# Patient Record
Sex: Female | Born: 1935 | Race: White | Hispanic: No | Smoking: Never smoker
Health system: Southern US, Community
[De-identification: ages and names within clinical notes are randomized; demographics above are authoritative.]

## PROBLEM LIST (undated history)

## (undated) DIAGNOSIS — N813 Complete uterovaginal prolapse: Secondary | ICD-10-CM

## (undated) DIAGNOSIS — I5032 Chronic diastolic (congestive) heart failure: Secondary | ICD-10-CM

## (undated) DIAGNOSIS — K5090 Crohn's disease, unspecified, without complications: Secondary | ICD-10-CM

## (undated) DIAGNOSIS — K649 Unspecified hemorrhoids: Secondary | ICD-10-CM

## (undated) DIAGNOSIS — N390 Urinary tract infection, site not specified: Principal | ICD-10-CM

## (undated) DIAGNOSIS — I10 Essential (primary) hypertension: Secondary | ICD-10-CM

## (undated) DIAGNOSIS — I5031 Acute diastolic (congestive) heart failure: Secondary | ICD-10-CM

## (undated) DIAGNOSIS — K5790 Diverticulosis of intestine, part unspecified, without perforation or abscess without bleeding: Secondary | ICD-10-CM

## (undated) DIAGNOSIS — N8110 Cystocele, unspecified: Secondary | ICD-10-CM

## (undated) DIAGNOSIS — N1330 Unspecified hydronephrosis: Secondary | ICD-10-CM

## (undated) DIAGNOSIS — G894 Chronic pain syndrome: Secondary | ICD-10-CM

## (undated) DIAGNOSIS — W19XXXA Unspecified fall, initial encounter: Secondary | ICD-10-CM

## (undated) DIAGNOSIS — IMO0002 Reserved for concepts with insufficient information to code with codable children: Secondary | ICD-10-CM

## (undated) DIAGNOSIS — K5792 Diverticulitis of intestine, part unspecified, without perforation or abscess without bleeding: Secondary | ICD-10-CM

## (undated) DIAGNOSIS — S32599A Other specified fracture of unspecified pubis, initial encounter for closed fracture: Secondary | ICD-10-CM

## (undated) DIAGNOSIS — B9620 Unspecified Escherichia coli [E. coli] as the cause of diseases classified elsewhere: Secondary | ICD-10-CM

## (undated) DIAGNOSIS — Z8639 Personal history of other endocrine, nutritional and metabolic disease: Secondary | ICD-10-CM

## (undated) HISTORY — DX: Personal history of other endocrine, nutritional and metabolic disease: Z86.39

## (undated) HISTORY — DX: Complete uterovaginal prolapse: N81.3

## (undated) HISTORY — DX: Chronic pain syndrome: G89.4

## (undated) HISTORY — DX: Cystocele, unspecified: N81.10

## (undated) HISTORY — PX: HIP FRACTURE SURGERY: SHX118

## (undated) HISTORY — DX: Chronic diastolic (congestive) heart failure: I50.32

## (undated) HISTORY — DX: Other specified fracture of unspecified pubis, initial encounter for closed fracture: S32.599A

## (undated) HISTORY — DX: Acute diastolic (congestive) heart failure: I50.31

## (undated) HISTORY — DX: Unspecified fall, initial encounter: W19.XXXA

## (undated) HISTORY — DX: Unspecified Escherichia coli (E. coli) as the cause of diseases classified elsewhere: B96.20

## (undated) HISTORY — DX: Unspecified hemorrhoids: K64.9

## (undated) HISTORY — DX: Essential (primary) hypertension: I10

## (undated) HISTORY — DX: Diverticulitis of intestine, part unspecified, without perforation or abscess without bleeding: K57.92

## (undated) HISTORY — DX: Unspecified hydronephrosis: N13.30

## (undated) HISTORY — PX: TONSILLECTOMY: SUR1361

## (undated) HISTORY — PX: RHINOPLASTY: SUR1284

## (undated) HISTORY — DX: Diverticulosis of intestine, part unspecified, without perforation or abscess without bleeding: K57.90

## (undated) HISTORY — DX: Reserved for concepts with insufficient information to code with codable children: IMO0002

## (undated) HISTORY — DX: Urinary tract infection, site not specified: N39.0

## (undated) HISTORY — DX: Crohn's disease, unspecified, without complications: K50.90

---

## 2004-06-20 ENCOUNTER — Inpatient Hospital Stay (HOSPITAL_COMMUNITY): Admission: EM | Admit: 2004-06-20 | Discharge: 2004-06-25 | Payer: Self-pay | Admitting: Emergency Medicine

## 2008-06-10 DIAGNOSIS — K5090 Crohn's disease, unspecified, without complications: Secondary | ICD-10-CM

## 2008-06-10 HISTORY — DX: Crohn's disease, unspecified, without complications: K50.90

## 2008-12-20 ENCOUNTER — Ambulatory Visit: Payer: Self-pay | Admitting: Internal Medicine

## 2008-12-20 ENCOUNTER — Inpatient Hospital Stay (HOSPITAL_COMMUNITY): Admission: EM | Admit: 2008-12-20 | Discharge: 2008-12-26 | Payer: Self-pay | Admitting: Emergency Medicine

## 2008-12-20 ENCOUNTER — Encounter (INDEPENDENT_AMBULATORY_CARE_PROVIDER_SITE_OTHER): Payer: Self-pay | Admitting: *Deleted

## 2008-12-21 ENCOUNTER — Encounter: Payer: Self-pay | Admitting: Internal Medicine

## 2008-12-23 ENCOUNTER — Encounter: Payer: Self-pay | Admitting: Internal Medicine

## 2008-12-23 HISTORY — PX: COLONOSCOPY: SHX5424

## 2009-01-04 ENCOUNTER — Ambulatory Visit: Payer: Self-pay | Admitting: Internal Medicine

## 2009-01-04 DIAGNOSIS — N8110 Cystocele, unspecified: Secondary | ICD-10-CM | POA: Insufficient documentation

## 2009-01-04 DIAGNOSIS — K5000 Crohn's disease of small intestine without complications: Secondary | ICD-10-CM | POA: Insufficient documentation

## 2009-01-05 ENCOUNTER — Encounter: Payer: Self-pay | Admitting: Internal Medicine

## 2009-01-05 LAB — CONVERTED CEMR LAB: CRP, High Sensitivity: 2.2

## 2009-01-06 ENCOUNTER — Encounter: Payer: Self-pay | Admitting: Internal Medicine

## 2009-01-06 DIAGNOSIS — E559 Vitamin D deficiency, unspecified: Secondary | ICD-10-CM | POA: Insufficient documentation

## 2009-02-08 ENCOUNTER — Ambulatory Visit: Payer: Self-pay | Admitting: Internal Medicine

## 2009-02-17 ENCOUNTER — Telehealth: Payer: Self-pay | Admitting: Internal Medicine

## 2009-02-22 ENCOUNTER — Ambulatory Visit: Payer: Self-pay | Admitting: Internal Medicine

## 2009-02-22 LAB — CONVERTED CEMR LAB
AST: 14 units/L (ref 0–37)
Basophils Absolute: 0.1 10*3/uL (ref 0.0–0.1)
Bilirubin, Direct: 0.1 mg/dL (ref 0.0–0.3)
Eosinophils Absolute: 0 10*3/uL (ref 0.0–0.7)
Eosinophils Relative: 0.2 % (ref 0.0–5.0)
HCT: 42.8 % (ref 36.0–46.0)
Hemoglobin: 14.8 g/dL (ref 12.0–15.0)
Lymphocytes Relative: 7.3 % — ABNORMAL LOW (ref 12.0–46.0)
MCV: 88.6 fL (ref 78.0–100.0)
Monocytes Absolute: 0.2 10*3/uL (ref 0.1–1.0)
Neutro Abs: 7.5 10*3/uL (ref 1.4–7.7)
Platelets: 323 10*3/uL (ref 150.0–400.0)
RBC: 4.83 M/uL (ref 3.87–5.11)
Total Protein: 7 g/dL (ref 6.0–8.3)
WBC: 8.4 10*3/uL (ref 4.5–10.5)

## 2009-03-03 ENCOUNTER — Telehealth: Payer: Self-pay | Admitting: Internal Medicine

## 2009-03-13 ENCOUNTER — Encounter: Payer: Self-pay | Admitting: Internal Medicine

## 2009-03-13 ENCOUNTER — Emergency Department (HOSPITAL_COMMUNITY): Admission: EM | Admit: 2009-03-13 | Discharge: 2009-03-13 | Payer: Self-pay | Admitting: Emergency Medicine

## 2009-03-18 ENCOUNTER — Ambulatory Visit: Payer: Self-pay | Admitting: Cardiology

## 2009-03-18 ENCOUNTER — Inpatient Hospital Stay (HOSPITAL_COMMUNITY): Admission: EM | Admit: 2009-03-18 | Discharge: 2009-03-24 | Payer: Self-pay | Admitting: Emergency Medicine

## 2009-03-20 ENCOUNTER — Ambulatory Visit: Payer: Self-pay | Admitting: Internal Medicine

## 2009-03-20 ENCOUNTER — Ambulatory Visit: Payer: Self-pay | Admitting: Gastroenterology

## 2009-03-21 ENCOUNTER — Encounter: Payer: Self-pay | Admitting: Internal Medicine

## 2009-03-21 ENCOUNTER — Encounter (INDEPENDENT_AMBULATORY_CARE_PROVIDER_SITE_OTHER): Payer: Self-pay | Admitting: *Deleted

## 2009-03-22 ENCOUNTER — Encounter (INDEPENDENT_AMBULATORY_CARE_PROVIDER_SITE_OTHER): Payer: Self-pay | Admitting: Internal Medicine

## 2009-03-22 ENCOUNTER — Ambulatory Visit: Payer: Self-pay | Admitting: Physical Medicine & Rehabilitation

## 2009-04-20 LAB — CONVERTED CEMR LAB

## 2009-05-16 ENCOUNTER — Telehealth: Payer: Self-pay | Admitting: Internal Medicine

## 2009-07-26 ENCOUNTER — Telehealth: Payer: Self-pay | Admitting: Internal Medicine

## 2009-08-03 ENCOUNTER — Ambulatory Visit: Payer: Self-pay | Admitting: Internal Medicine

## 2009-08-29 ENCOUNTER — Telehealth: Payer: Self-pay | Admitting: Internal Medicine

## 2009-08-30 ENCOUNTER — Ambulatory Visit: Payer: Self-pay | Admitting: Internal Medicine

## 2009-08-31 LAB — CONVERTED CEMR LAB
Alkaline Phosphatase: 84 units/L (ref 39–117)
BUN: 13 mg/dL (ref 6–23)
Creatinine, Ser: 0.9 mg/dL (ref 0.4–1.2)
Glucose, Bld: 149 mg/dL — ABNORMAL HIGH (ref 70–99)
Hemoglobin: 13.4 g/dL (ref 12.0–15.0)
MCHC: 33.6 g/dL (ref 30.0–36.0)
MCV: 99.3 fL (ref 78.0–100.0)
Monocytes Absolute: 0.4 10*3/uL (ref 0.1–1.0)
Neutrophils Relative %: 69.4 % (ref 43.0–77.0)
Potassium: 3.6 meq/L (ref 3.5–5.1)
RBC: 4.01 M/uL (ref 3.87–5.11)
RDW: 13 % (ref 11.5–14.6)
Total Bilirubin: 0.6 mg/dL (ref 0.3–1.2)
Total Protein: 6.6 g/dL (ref 6.0–8.3)

## 2009-09-04 ENCOUNTER — Ambulatory Visit: Payer: Self-pay | Admitting: Internal Medicine

## 2009-09-08 LAB — CONVERTED CEMR LAB
BUN: 14 mg/dL (ref 6–23)
Calcium: 10.9 mg/dL — ABNORMAL HIGH (ref 8.4–10.5)
Chloride: 106 meq/L (ref 96–112)
Creatinine, Ser: 0.8 mg/dL (ref 0.4–1.2)
GFR calc non Af Amer: 74.47 mL/min (ref >60)
Sodium: 140 meq/L (ref 135–145)

## 2009-09-18 ENCOUNTER — Ambulatory Visit: Payer: Self-pay | Admitting: Internal Medicine

## 2009-09-20 LAB — CONVERTED CEMR LAB
Calcium, Total (PTH): 10.6 mg/dL — ABNORMAL HIGH (ref 8.4–10.5)
PTH: 183.4 pg/mL — ABNORMAL HIGH (ref 14.0–72.0)

## 2009-10-02 ENCOUNTER — Telehealth: Payer: Self-pay | Admitting: Internal Medicine

## 2009-10-02 ENCOUNTER — Ambulatory Visit: Payer: Self-pay | Admitting: Internal Medicine

## 2009-10-02 DIAGNOSIS — R5383 Other fatigue: Secondary | ICD-10-CM

## 2009-10-02 DIAGNOSIS — E210 Primary hyperparathyroidism: Secondary | ICD-10-CM | POA: Insufficient documentation

## 2009-10-02 DIAGNOSIS — R5381 Other malaise: Secondary | ICD-10-CM | POA: Insufficient documentation

## 2009-10-02 LAB — CONVERTED CEMR LAB: TSH: 2.2 microintl units/mL (ref 0.35–5.50)

## 2009-10-03 LAB — CONVERTED CEMR LAB
Calcium, Total (PTH): 11.4 mg/dL — ABNORMAL HIGH (ref 8.4–10.5)
PTH: 165.6 pg/mL — ABNORMAL HIGH (ref 14.0–72.0)

## 2009-10-04 ENCOUNTER — Telehealth (INDEPENDENT_AMBULATORY_CARE_PROVIDER_SITE_OTHER): Payer: Self-pay | Admitting: *Deleted

## 2009-10-08 HISTORY — PX: LAPAROSCOPY: SHX197

## 2009-10-11 ENCOUNTER — Ambulatory Visit: Payer: Self-pay | Admitting: Internal Medicine

## 2009-10-11 ENCOUNTER — Encounter: Payer: Self-pay | Admitting: Internal Medicine

## 2009-10-17 ENCOUNTER — Ambulatory Visit: Payer: Self-pay | Admitting: Endocrinology

## 2009-10-17 DIAGNOSIS — M810 Age-related osteoporosis without current pathological fracture: Secondary | ICD-10-CM | POA: Insufficient documentation

## 2009-10-30 LAB — CONVERTED CEMR LAB
Albumin: 3.8 g/dL (ref 3.5–5.2)
Alkaline Phosphatase: 81 units/L (ref 39–117)
BUN: 23 mg/dL (ref 6–23)
Basophils Absolute: 0 10*3/uL (ref 0.0–0.1)
CO2: 30 meq/L (ref 19–32)
Calcium: 10.8 mg/dL — ABNORMAL HIGH (ref 8.4–10.5)
Eosinophils Relative: 3 % (ref 0.0–5.0)
GFR calc non Af Amer: 51.58 mL/min (ref >60)
Lymphs Abs: 0.8 10*3/uL (ref 0.7–4.0)
Monocytes Absolute: 0.5 10*3/uL (ref 0.1–1.0)
Potassium: 4.3 meq/L (ref 3.5–5.1)
RBC: 3.94 M/uL (ref 3.87–5.11)
RDW: 13.1 % (ref 11.5–14.6)
Sodium: 140 meq/L (ref 135–145)
Total Protein: 6.7 g/dL (ref 6.0–8.3)

## 2009-10-31 ENCOUNTER — Encounter (INDEPENDENT_AMBULATORY_CARE_PROVIDER_SITE_OTHER): Payer: Self-pay | Admitting: *Deleted

## 2009-11-02 ENCOUNTER — Ambulatory Visit (HOSPITAL_COMMUNITY): Admission: RE | Admit: 2009-11-02 | Discharge: 2009-11-02 | Payer: Self-pay | Admitting: Obstetrics and Gynecology

## 2009-11-02 ENCOUNTER — Encounter: Payer: Self-pay | Admitting: Internal Medicine

## 2009-11-07 ENCOUNTER — Ambulatory Visit (HOSPITAL_COMMUNITY): Admission: RE | Admit: 2009-11-07 | Discharge: 2009-11-07 | Payer: Self-pay | Admitting: Obstetrics and Gynecology

## 2009-11-07 ENCOUNTER — Encounter: Payer: Self-pay | Admitting: Internal Medicine

## 2009-11-07 ENCOUNTER — Telehealth: Payer: Self-pay | Admitting: Internal Medicine

## 2009-11-08 ENCOUNTER — Telehealth: Payer: Self-pay | Admitting: Internal Medicine

## 2009-11-09 ENCOUNTER — Ambulatory Visit: Payer: Self-pay | Admitting: Internal Medicine

## 2009-11-09 DIAGNOSIS — Z9119 Patient's noncompliance with other medical treatment and regimen: Secondary | ICD-10-CM | POA: Insufficient documentation

## 2009-11-09 DIAGNOSIS — Z91199 Patient's noncompliance with other medical treatment and regimen due to unspecified reason: Secondary | ICD-10-CM | POA: Insufficient documentation

## 2009-11-19 ENCOUNTER — Telehealth: Payer: Self-pay | Admitting: Internal Medicine

## 2009-11-22 ENCOUNTER — Telehealth: Payer: Self-pay | Admitting: Internal Medicine

## 2009-11-26 ENCOUNTER — Telehealth: Payer: Self-pay | Admitting: Internal Medicine

## 2009-12-14 ENCOUNTER — Telehealth: Payer: Self-pay | Admitting: Internal Medicine

## 2010-01-09 ENCOUNTER — Ambulatory Visit: Payer: Self-pay | Admitting: Internal Medicine

## 2010-01-10 ENCOUNTER — Ambulatory Visit: Payer: Self-pay | Admitting: Internal Medicine

## 2010-01-11 LAB — CONVERTED CEMR LAB
ALT: 18 units/L (ref 0–35)
AST: 20 units/L (ref 0–37)
Albumin: 3.8 g/dL (ref 3.5–5.2)
CO2: 27 meq/L (ref 19–32)
Chloride: 104 meq/L (ref 96–112)
Creatinine, Ser: 0.8 mg/dL (ref 0.4–1.2)
Eosinophils Absolute: 0.1 10*3/uL (ref 0.0–0.7)
GFR calc non Af Amer: 71.3 mL/min (ref >60)
Glucose, Bld: 98 mg/dL (ref 70–99)
Lymphs Abs: 0.7 10*3/uL (ref 0.7–4.0)
MCHC: 35.1 g/dL (ref 30.0–36.0)
Monocytes Absolute: 0.4 10*3/uL (ref 0.1–1.0)
Sodium: 140 meq/L (ref 135–145)
Total Bilirubin: 0.9 mg/dL (ref 0.3–1.2)
WBC: 4.7 10*3/uL (ref 4.5–10.5)

## 2010-01-16 ENCOUNTER — Telehealth: Payer: Self-pay | Admitting: Internal Medicine

## 2010-01-17 ENCOUNTER — Ambulatory Visit: Payer: Self-pay | Admitting: Internal Medicine

## 2010-01-30 ENCOUNTER — Telehealth: Payer: Self-pay | Admitting: Internal Medicine

## 2010-03-09 ENCOUNTER — Telehealth: Payer: Self-pay | Admitting: Internal Medicine

## 2010-03-14 ENCOUNTER — Encounter (INDEPENDENT_AMBULATORY_CARE_PROVIDER_SITE_OTHER): Payer: Self-pay | Admitting: *Deleted

## 2010-03-19 ENCOUNTER — Ambulatory Visit: Payer: Self-pay | Admitting: Internal Medicine

## 2010-05-02 ENCOUNTER — Encounter (INDEPENDENT_AMBULATORY_CARE_PROVIDER_SITE_OTHER): Payer: Self-pay | Admitting: *Deleted

## 2010-06-21 ENCOUNTER — Telehealth: Payer: Self-pay | Admitting: Internal Medicine

## 2010-06-29 ENCOUNTER — Other Ambulatory Visit: Payer: Self-pay | Admitting: Internal Medicine

## 2010-06-29 ENCOUNTER — Ambulatory Visit
Admission: RE | Admit: 2010-06-29 | Discharge: 2010-06-29 | Payer: Self-pay | Source: Home / Self Care | Attending: Internal Medicine | Admitting: Internal Medicine

## 2010-06-29 LAB — CBC WITH DIFFERENTIAL/PLATELET
Basophils Absolute: 0 10*3/uL (ref 0.0–0.1)
Basophils Relative: 0.2 % (ref 0.0–3.0)
Eosinophils Absolute: 0.1 10*3/uL (ref 0.0–0.7)
Eosinophils Relative: 2.5 % (ref 0.0–5.0)
HCT: 39.8 % (ref 36.0–46.0)
Hemoglobin: 14 g/dL (ref 12.0–15.0)
Lymphocytes Relative: 17.8 % (ref 12.0–46.0)
Lymphs Abs: 0.7 10*3/uL (ref 0.7–4.0)
MCHC: 35.1 g/dL (ref 30.0–36.0)
MCV: 101.3 fl — ABNORMAL HIGH (ref 78.0–100.0)
Monocytes Absolute: 0.4 10*3/uL (ref 0.1–1.0)
Monocytes Relative: 9.8 % (ref 3.0–12.0)
Neutro Abs: 2.7 10*3/uL (ref 1.4–7.7)
Neutrophils Relative %: 69.7 % (ref 43.0–77.0)
Platelets: 191 10*3/uL (ref 150.0–400.0)
RBC: 3.93 Mil/uL (ref 3.87–5.11)
RDW: 11.7 % (ref 11.5–14.6)
WBC: 3.8 10*3/uL — ABNORMAL LOW (ref 4.5–10.5)

## 2010-06-29 LAB — HEPATIC FUNCTION PANEL
ALT: 25 U/L (ref 0–35)
AST: 22 U/L (ref 0–37)
Albumin: 3.8 g/dL (ref 3.5–5.2)
Alkaline Phosphatase: 98 U/L (ref 39–117)
Bilirubin, Direct: 0.1 mg/dL (ref 0.0–0.3)
Total Bilirubin: 1 mg/dL (ref 0.3–1.2)
Total Protein: 6.5 g/dL (ref 6.0–8.3)

## 2010-07-01 ENCOUNTER — Encounter: Payer: Self-pay | Admitting: Obstetrics and Gynecology

## 2010-07-06 ENCOUNTER — Ambulatory Visit
Admission: RE | Admit: 2010-07-06 | Discharge: 2010-07-06 | Payer: Self-pay | Source: Home / Self Care | Attending: Internal Medicine | Admitting: Internal Medicine

## 2010-07-06 ENCOUNTER — Other Ambulatory Visit: Payer: Self-pay | Admitting: Internal Medicine

## 2010-07-08 LAB — CONVERTED CEMR LAB
AST: 14 units/L (ref 0–37)
Albumin: 3.6 g/dL (ref 3.5–5.2)
Alkaline Phosphatase: 68 units/L (ref 39–117)
Basophils Absolute: 0.1 10*3/uL (ref 0.0–0.1)
Basophils Relative: 0.5 % (ref 0.0–3.0)
Eosinophils Absolute: 0 10*3/uL (ref 0.0–0.7)
Eosinophils Relative: 0.4 % (ref 0.0–5.0)
GFR calc non Af Amer: 87.03 mL/min (ref >60)
HCT: 40.6 % (ref 36.0–46.0)
Hemoglobin: 14.1 g/dL (ref 12.0–15.0)
Lymphocytes Relative: 10 % — ABNORMAL LOW (ref 12.0–46.0)
Lymphs Abs: 1.1 10*3/uL (ref 0.7–4.0)
Monocytes Absolute: 0.8 10*3/uL (ref 0.1–1.0)
Neutrophils Relative %: 82.2 % — ABNORMAL HIGH (ref 43.0–77.0)
Potassium: 3.7 meq/L (ref 3.5–5.1)
RDW: 13.5 % (ref 11.5–14.6)
Sodium: 140 meq/L (ref 135–145)
Vit D, 25-Hydroxy: 10 ng/mL — ABNORMAL LOW (ref 30–89)
WBC: 11.4 10*3/uL — ABNORMAL HIGH (ref 4.5–10.5)

## 2010-07-10 NOTE — Op Note (Signed)
Summary: Complete uterovaginal prolapse/Adbominal Mass/Dr.MacDiarmid Note  NAME:  [name], [name]               ACCOUNT NO.:  [id number]      MEDICAL RECORD NO.:  [id number]          PATIENT TYPE:  AMB      LOCATION:  [place]                           FACILITY:  [facility]      PHYSICIAN:  Reece Packer, MD DATE OF BIRTH:  1933-01-22      DATE OF PROCEDURE:  11/07/2009   DATE OF DISCHARGE:  11/07/2009                                  OPERATIVE REPORT      PREOPERATIVE DIAGNOSES:   1. Complete uterine vault prolapse.   2. Abdominal mass.      POSTOPERATIVE DIAGNOSES:   1. Ureteral vault prolapse with abdominal mass.   2. Examination under anesthesia.      Dr. Quincy Simmonds saw [name] last week preoperatively.  She had noted an   abdominal mass to the right of the umbilicus in the right lower quadrant   or right mid quadrant.  It was approximately 8 cm in size.  She had a CT   scan, which was consistent with her last CT scan.  The patient had a few   small lymph nodes, but was felt probably secondary to Crohn disease and   thickening of her small bowel.      The plan was to do laparoscopy and hopefully proceed with surgery under   anesthesia.  Family was aware.  I examined the patient and one could   feel a ballotable mass as noted above.      The mass was away from her pelvis.  In my opinion, it was not   genitourinary in origin.      Dr. Quincy Simmonds wanted to do laparoscopy.  She found what appeared to be an   intraluminal bowel mass, and we both agreed that under the circumstances   that we should not proceed with surgery.  She had some nonspecific   lesions in her liver, nonspecific lymph nodes, and a palpable mass in   her bowel.  We rely that it could be due to Crohn disease, but we were   also concerned that it could represent a malignancy.                  ______________________________   Reece Packer, MD            SAM/MEDQ  D:  11/07/2009  T:  11/08/2009  Job:  [id number]

## 2010-07-10 NOTE — Letter (Signed)
Summary: Appointment Reminder  [facility]  [address] [place], [place] [address]   Phone: [phone]  Fax: [phone]        March 14, 2010 MRN: [id number]    [name], [place]  [address]    Dear [name],   We have been unable to reach you by phone in response to your recent   phone call to our office. I have sent a refill of your 6-MP to [facility].  It is very important that we reach you to schedule an   appointment. You are also due to have repeat labs.  I have entered the   order into the computer.  Please come to our lab at your earliest   convenience to have these labs drawn.  We hope that you allow [place] to   participate in your health care needs.   Please contact us [phone] at your earliest convenience to   schedule your appointment.   Sincerely,    Abelino Derrick CMA ([place])

## 2010-07-10 NOTE — Progress Notes (Signed)
Summary: Increase 6MP/Labs/REV  Phone Note Outgoing Call Call back at Cell# [phone]   Summary of Call: 1) Let her know that drug levels are slightly low, so I want her to increase the 6MP to 1& 1/2 tabs/day (new Rx written and sent) 2) Needs CBC and LFT's in 1 month to follow-up on the dose change. 3) REV w/me in 3-4 months Initial call taken by: Gatha Mayer MD, [facility] Regal,  January 30, 2010 3:50 PM  Follow-up for Phone Call        Message left for patient to callback. Caitlyn Ewing LPN  January 31, 1847 3:56 PM   Additional Follow-up for Phone Call Additional follow up Details #1::        Above MD orders reviewed with patient. She will have labs drawn around 03-02-10. She will callback to schedule an appt. for Nov. or Dec.  She has changed her pharmacy to [facility], [place], I will forward her script there, per her request.  Pt. instructed to call back as needed.  Additional Follow-up by: Caitlyn Ewing LPN,  January 31, 5926 9:32 AM    New/Updated Medications: MERCAPTOPURINE 50 MG  TABS (MERCAPTOPURINE) Take 1 1/2 tablet by mouth once a day Prescriptions: MERCAPTOPURINE 50 MG  TABS (MERCAPTOPURINE) Take 1 1/2 tablet by mouth once a day  #45 x 3   Entered by:   Caitlyn Ewing LPN   Authorized by:   Gatha Mayer MD, [facility]   Signed by:   Caitlyn Ewing LPN on 63/94/3200   Method used:   Electronically to        [id number] (retail)       [address] [place], [place]  [address]       Ph: [phone]       Fax: [phone]   RxID:   [id number] MERCAPTOPURINE 50 MG  TABS (MERCAPTOPURINE) Take 1 1/2 tablet by mouth once a day  #45 x 3   Entered and Authorized by:   Gatha Mayer MD, [facility]   Signed by:   Gatha Mayer MD, FACG on 01/30/2010   Method used:   Electronically to        [facility] (retail)       [address]       [place], [place]  [address]       Ph: [phone]       Fax: [phone]   RxID:    [id number]

## 2010-07-10 NOTE — Assessment & Plan Note (Signed)
Summary: discuss complication with surgery...em   History of Present Illness Visit Type: Follow-up Visit Primary GI MD: Silvano Rusk MD [facility] Primary Provider: Rowe Clack MD Requesting Provider: n/a Chief Complaint: colon mass History of Present Illness:   77 yo married white woman with Crohn's ileitis diagnosed when she presented with an SBO. She has been fairly well-controlled with respect to signs and symptoms, it seems. she recently had a diagnostic laparoscopic as part of aborted repair of bladder prolapse. A pre-operative CT showed inflammatory changes of Crohn's in the small bowel and abnormal mesentery with lymphadenopathy and tnflammatory/scarring changes. The CT was ordered after Dr. Quincy Simmonds palpated a mass on physical exam. I had not noted a mass at last visit in March. she is here today to discuss these findings and I have already discussed this with Dr. Quincy Simmonds. Her son veternarian) and daughter Art therapist) are here with her.  She has been  off all meds x 10 days (i.e. not taking 6MP) because she says her pre-operative instructions told her to do so. She told me this after I undicated that i would check 6TG levels to see if her 6MP dose was correct and that her levels were therapeutic. She denies any problems with her Crohn's like pain, nausea/vomiting or diarrhea. She does have some abdominal distention and is tender at laparoscopy sites. The daughter is concerned that the umbilical port site may be infected as it looks red.           Current Medications (verified): 1)  Mercaptopurine 50 Mg  Tabs (Mercaptopurine) .... Take 1 Tablet By Mouth Once A Day 2)  Aspir-Low 81 Mg Tbec (Aspirin) .... Once Daily 3)  Multivitamins  Tabs (Multiple Vitamin) .... Once Daily 4)  Vitamin D3 1000 Unit Tabs (Cholecalciferol) .... Once Daily 5)  Calcium 600 1500 Mg Tabs (Calcium Carbonate) .... Once Daily 6)  Fosamax 70 Mg Tabs (Alendronate Sodium) .Marland Kitchen.. 1 By Mouth Every Week, Take 2 Hours Before  Any Other Food or Medicines  Allergies (verified): 1)  ! * No Live Vaccines On 2m  Past History:  Past Medical History: Crohn's Ileitis Urinary Tract Infection-recurrent Prolapsed Bladder Osteoporosis Pubic ramus fracture 03/2009 Hyperparathyroidism - Primary  MD roster: GI - g[facility]ner gyn - silva uro - [facility] ortho - [facility]/[place] [facility]  Past Surgical History: Reviewed history from 10/02/2009 and no changes required. left Hip Surgery following fracture, prosthesis (2006) Rhinoplasty x 2 Tonsillectomy  Family History: Reviewed history from 10/17/2009 and no changes required. No FH of Colon Cancer: Family History of Heart Disease: Father no hyperparathyroidism.   mother had osteoporosis.  Social History: Reviewed history from 10/17/2009 and no changes required. 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  Alcohol Use - yes occ wine with spaghetti Daily Caffeine Use coffee Illicit Drug Use - no Patient does not get regular exercise.  3 adults live with her and husband. she is not happy with the boarders.  Review of Systems       chronic bladder and vaginal prolaps  Vital Signs:  Patient profile:   77year old female Height:      65 inches Weight:      131.38 pounds BMI:     21.94 Pulse rate:   68 / minute Pulse rhythm:   regular BP sitting:   140 / 74  (left arm)  Vitals Entered By: BRandye LoboNCMA (November 09, 2009 3:27 PM)  Physical Exam  General:  Well developed, well nourished, no acute distress. Abdomen:  ? slight erythema  above umbilicus and scar ecchymosis  fullness in RLQ no discrete mass BS+ other laparoscopic wounds ok  Extremities:  no edema   Impression & Recommendations:  Problem # 1:  CROHN'S DISEASE, SMALL INTESTINE (ICD-555.0) Assessment Unchanged Diagnosed by CT and colonoscopy 7/10 initially treated with prednisone She seems asymptomatic on 6MP (since 9/10), however I  am not certain of her history in that I suspect she is a minimizer. The CT findings do indiacte active inflammation and her admission that she had been off her meds x 10 days because the pre-op plans were such raises more ?'s She is to restart 6MP.  I have personally reviewed the CT with radiologis also and believe that the mesenteric changes must be what the mass felt and seen by Dr. Quincy Simmonds were and are. I can feel a fullness in the RLQ but no mass. She is a bit tender post-op and it may be limiting exam.  Will discuss with Drs. Quincy Simmonds and McDiairmid and consider other therapy if it would help allow repair of bladder prolapse. Remicade x 3 doses could make sense, if she would take it.  Problem # 2:  ENCOUNTER FOR LONG-TERM USE OF OTHER MEDICATIONS (ICD-V58.69) Assessment: Unchanged There are significant ?'s about her compliance with 6MP - will check refills Per daughter and son, and somewhat corroborated by the patient, she (and huusband) believe more in healing power of God rather than medical therapy. Her husband has never accompanied her to my office nor was he at hospital.  Problem # 3:  NONSPECIFIC ABN FINDING RAD & OTH EXAM GI TRACT (ICD-793.4) Assessment: New RLQ mass at laparoscopy, inflammatory changes of crohn's ileitis and mesentery changes on CT must be same thing There is an increased risk of lymphoma in Crohn's, ? if she could have that but seems doubtful. Do not think percutaneous biopsy would be helpful. this may preclude or at least delay her surgery. will discuss with Drs. Silva and [place].  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 ? if due to RLQ and retroperitoeal problems/mass.  Problem # 5:  PRIMARY HYPERPARATHYROIDISM (ICD-252.01) Assessment: Unchanged Another complicating issue in this lady. Dr. Loanne Drilling did not think that this (Ca++ level) would preclude her surgery. He  recommended she seek surgical therapy for this, though. Compliance is undoubtedly an issue here, too. Will factor this in to other discussions. Does raise furtyher ?'s about etiology of findings at laparoscopy though I cannot think of clear link off the top of my head will consider and look into possible connections. this may raise need for biopsy of that area.  Problem # 6:  PERS HX NONCOMPLIANCE W/MED TX PRS HAZARDS HLTH (ICD-V15.81) Assessment: Comment Only she is at [facility] very reluctant to accept most recoomendations of her physicians, in my opinion. There is tension and disgreement with her son and daughter over this. her husband has not participated in any visits she has had with me.  Patient Instructions: 1)  Be sure to take all of your medications as prescribed. 2)  Dr. Carlean Purl will call you after he speaks to Dr. Quincy Simmonds and Dr. McDiarmid 3)  The medication list was reviewed and reconciled.  All changed / newly prescribed medications were explained.  A complete medication list was provided to the patient / caregiver. 40 minutes of time spent with patient, son and daughter today, mostly  discussing her situation, options and what next steps are.

## 2010-07-10 NOTE — Letter (Signed)
Summary: Office Visit Letter  [facility]  [address] [place], [place] [address]   Phone: [phone]  Fax: [phone]      May 02, 2010 MRN: [id number]   [facility] [address] [place], [place]  [address]   Dear Ms. Hakanson,   According to our records, it is time for you to schedule a follow-up office visit with [place].   At your convenience, please call [phone] (option #2)to schedule an office visit. If you have any questions, concerns, or feel that this letter is in error, we would appreciate your call.   Sincerely,   Gatha Mayer, M.D.  [facility] [phone]

## 2010-07-10 NOTE — Progress Notes (Signed)
Summary: Earlier appt.  Phone Note Call from Patient Call back at Home Phone [phone]   Caller: Patient Call For: Dr. Carlean Purl Reason for Call: Talk to Nurse Summary of Call: 1. pt sch'ed for next available for f/u, but says this will be too long to wait 2. pt also wants to know if she should be getting labwork Initial call taken by: Lucien Mons,  July 26, 2009 4:52 PM  Follow-up for Phone Call        Dr. Carlean Purl please see appends to 2/15 refill.  Pt is scheduled for 3/28-we can use an RNA slot or double to get her in sooner.  She has not had labs since October.  Do you want her to come in now for labs? Follow-up by: Abelino Derrick CMA Deborra Medina),  July 27, 2009 10:25 AM  Additional Follow-up for Phone Call Additional follow up Details #1::        get labs CBC, CMET and work her in next week Gatha Mayer MD, [facility]  July 27, 2009 12:01 PM  Message left for pt to return call. Abelino Derrick CMA Deborra Medina)  July 27, 2009 2:14 PM   Spoke with pt.  Appt scheduled for 08/03/09 @ 3:15.  pt will come early to have labs drawn. Additional Follow-up by: Abelino Derrick CMA Deborra Medina),  August 01, 2009 10:39 AM

## 2010-07-10 NOTE — Progress Notes (Signed)
Summary: Mercaptopurine refill  Phone Note Call from Patient   Caller: Patient Call For: Dr. Carlean Purl Reason for Call: Refill Medication Summary of Call: Needs refill on Mercaptopurine send to [facility] Initial call taken by: Webb Laws,  December 14, 2009 10:54 AM  Follow-up for Phone Call        Notified pt refill has been sent to pharm.  Also reminded pt of appointments below.. REV scheduled for 01/10/10 1:45.  She will come for lab 12/27/09. Follow-up by: Abelino Derrick CMA Deborra Medina),  December 14, 2009 11:08 AM    Prescriptions: MERCAPTOPURINE 50 MG  TABS (MERCAPTOPURINE) Take 1 tablet by mouth once a day  #30 Tablet x 0   Entered by:   Abelino Derrick CMA ([place])   Authorized by:   Gatha Mayer MD, [facility]   Signed by:   Abelino Derrick CMA ([place]) on 12/14/2009   Method used:   Electronically to        [id number] (retail)       [address] [place], [place]  [address]       Ph: [phone]       Fax: [phone]   RxID:   [id number]

## 2010-07-10 NOTE — Progress Notes (Signed)
Summary: no surgery now  Phone Note Outgoing Call   Summary of Call: Have attempted to reach her but unsuccessel so far 1) Have discussed her situation with Dr. Wendy Poet but not [name] - however, both MD's doing surgery and Dr. McDiarmid says not now, if ever and recommends [place] for now  She needs to stay on therapy, see me in 2 months, get 6TG and 6MMP levels in 2 weesk before appt please also inform son [phone] [name]- veternaran) and [name] [phone] (cell)  I will try to speak to them if I am able but cannot guarantee til later  please relay  to Dr. Quincy [name] also Caitlyn Mayer MD, [facility]  November 26, 2009 9:54 AM   Follow-up for Phone Call        I have left a message at the 2 above #'s.  Dr [name] called me this am and reviewed all the above with her. Follow-up by: Barb Merino RN, [place],  November 27, 2009 9:08 AM  Additional Follow-up for Phone Call Additional follow up Details #1::        Patient 's daughter did call back , she has been updated on the above.  I spoke to her for a long time.  She is upset that there is nothing being offered to her mother, and that there has been no communication from our office.  I did relay to her that there were several attempts made to reach the patient.  I also let her know that the home phone # listed can't have a message left at it.  She will call me back with alternate phone #'s.   Additional Follow-up by: Barb Merino RN, [place],  November 27, 2009 9:28 AM    Additional Follow-up for Phone Call Additional follow up Details #2::    I spoke with the patient's son .  He reports her machine is fixed and I have left her a message asking her to call me back. Follow-up by: Barb Merino RN, [place],  November 27, 2009 4:14 PM  Additional Follow-up for Phone Call Additional follow up Details #3:: Details for Additional Follow-up Action Taken: REV scheduled for 01/10/10 1:45.  She will come for lab 12/27/09 Barb Merino RN, [facility]  November 28, 2009 8:43 AM Additional  Follow-up by: Barb Merino RN, [place],  November 28, 2009 8:43 AM

## 2010-07-10 NOTE — Progress Notes (Signed)
Summary: call from Dr. Quincy Simmonds re: abd mass  ---- Converted from flag ---- ---- 11/07/2009 9:26 AM, Lucien Mons wrote: Dr. Josefa Half, [place] [phone] regarding pt [name]. Dobrowski   27-Jan-2033 ------------------------------  Phone Note From Other Clinic   Summary of Call: I left Dr. Quincy Simmonds a message that I would try to call her back orshe could call me Gatha Mayer MD, [facility]  November 08, 2009 10:48 AM   Follow-up for Phone Call        I spoke to Dr. Quincy Simmonds and will arrange follow-up with Ms. Gavitt re: mass in RLQ. She needs to see me this week or next week. She actually has an appointment tomorrow. Follow-up by: Gatha Mayer MD, [facility] Regal,  November 08, 2009 5:38 PM

## 2010-07-10 NOTE — Progress Notes (Signed)
----   Converted from flag ---- ---- 10/04/2009 4:26 PM, Lucienne Capers wrote: [name] pt appt:  phone--10/17/08 @ 10a w/Dr Loanne Drilling  ---- 10/03/2009 8:08 AM, Ophelia Charter wrote: Pls schedule with Dr Loanne Drilling.   Thanks  ---- 10/02/2009 6:33 PM, Rowe Clack MD wrote: The following orders have been entered for this patient and placed on Admin Hold:  Type:     Referral       Code:   Endocrine Description:   Endocrinology Referral Order Date:   10/02/2009   Authorized By:   Rowe Clack MD Order #:   [id number] Clinical Notes:   [facility]- eval and tx ------------------------------

## 2010-07-10 NOTE — Assessment & Plan Note (Signed)
Summary: CROHN'S/SP   History of Present Illness Visit Type: Follow-up Visit Primary GI MD: Silvano Rusk MD [facility] Primary Provider:  n/a Requesting Provider: n/a Chief Complaint: Med refills, patient is dizzy History of Present Illness:   Here with son. Crohn's disease dxed 7/10 at [facility] with SBO problems,on 6MP Not seen since 9/10 She fell and fractured her pelvis in 10/10 and was hospitalized. records reviewed. Bowels movements are regular, had transient minor rectal bleeding 1 month ago. Has not complied with lab follow-up though was in skilled facility. Son thinks there has been a short 2-3 day lapse in 6MP therapy due to    GI Review of Systems      Denies abdominal pain, acid reflux, belching, bloating, chest pain, dysphagia with liquids, dysphagia with solids, heartburn, loss of appetite, nausea, vomiting, vomiting blood, weight loss, and  weight gain.        Denies anal fissure, black tarry stools, change in bowel habit, constipation, diarrhea, diverticulosis, fecal incontinence, heme positive stool, hemorrhoids, irritable bowel syndrome, jaundice, light color stool, liver problems, rectal bleeding, and  rectal pain.    Current Medications (verified): 1)  Mercaptopurine 50 Mg  Tabs (Mercaptopurine) .... Take 1 Tablet By Mouth Once A Day--Must Make Appointment For More Refills 2)  Aspir-Low 81 Mg Tbec (Aspirin) .... Once Daily 3)  Multivitamins  Tabs (Multiple Vitamin) .... Once Daily 4)  Vitamin D3 1000 Unit Tabs (Cholecalciferol) .... Once Daily  Allergies (verified): 1)  ! * No Live Vaccines On 33mp  Past History:  Past Medical History: Crohn's Ileitis Urinary Tract Infection-recurrent Prolapsed Bladder Osteoporosis Pubic ramus fracture 10/10  Family History: Reviewed history from 01/04/2009 and no changes required. No FH of Colon Cancer: Family History of Heart Disease: Father  Social History: Reviewed history from 01/04/2009 and no changes  required. 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  Alcohol Use - yes occ wine with spaghetti Daily Caffeine Use coffee Illicit Drug Use - no Patient does not get regular exercise.   Review of Systems       The patient complains of arthritis/joint pain, back pain, confusion, and urination changes/pain.         awaiting bladder prolapse surgery - work-up ongoing recent UTI Tx periodic dizziness, toe pain, spells of fatigue  Vital Signs:  Patient profile:   77 year old female Height:      65 inches Weight:      124.38 pounds BMI:     20.77 Pulse rate:   80 / minute Pulse rhythm:   regular BP sitting:   126 / 68  (left arm) Cuff size:   regular  Vitals Entered By: June McMurray [place] Deborra Medina) (August 03, 2009 3:32 PM)  Physical Exam  General:  Well developed, well nourished, no acute distress.  Lungs:  Clear throughout to auscultation. Heart:  Regular rate and rhythm; no murmurs, rubs,  or bruits. Abdomen:  soft and non-tender, no masses Psych:  odd affect, somewhat tabgential thought/speech   Impression & Recommendations:  Problem # 1:  CROHN'S DISEASE, SMALL INTESTINE (ICD-555.0) Assessment Unchanged Diagnosed by CT and colonoscopy 7/10 initially treated with prednisone She seems asymptomatic on 6MP (since 9/10) son was asking about operative risks of possible bladder prolapse repair and hysterectomy Should not be at significantly greater risk though sould have adhesions at laporotomy/laparoscopy and increase difficulty of surgery Low-residue diet advised and regular by mouth intake though weight is stable (son is concernded about  her eating habits)  Problem # 2:  ENCOUNTER FOR LONG-TERM USE OF OTHER MEDICATIONS (ICD-V58.69) Assessment: New Re-explained need for monitoring for toxicity and yearly PAP smears (unless hysterectomy).  Problem # 3:  VITAMIN D DEFICIENCY (ICD-268.9) Assessment: Comment Only not assessed  today Await upcoming surgery but DEXA needed and bisphosphomate tx likely  SHE IS TO SEE PRIMARY CARE and will ask that they follow-up on this  Patient Instructions: 1)  We will call you with an appointment date/time with primary care. 2)  Please schedule a follow-up appointment in 6 months. 3)  Copy sent to : Shana Chute, MD, Josefa Half, MD 4)  The medication list was reviewed and reconciled.  All changed / newly prescribed medications were explained.  A complete medication list was provided to the patient / caregiver.

## 2010-07-10 NOTE — Progress Notes (Signed)
Summary: REfill  Phone Note Call from Patient   Call For: Dr Carlean Purl Reason for Call: Refill Medication Summary of Call: Needs a refill on her Mercaptapurine. Uses [facility] on [address]. If we have any qauestions for her she would like for [place] to call her spouse at his cell# [phone] becuase she is going to take a nap now.  Initial call taken by: Irwin Brakeman [facility],  March 09, 2010 1:52 PM  Follow-up for Phone Call        Message left with husband for Mrs. Antillon to call back. Abelino Derrick CMA Deborra Medina)  March 09, 2010 4:03 PM   LM to [facility] with husband. Abelino Derrick CMA Deborra Medina)  March 12, 2010 3:30 PM   Pt has not returned call.  Letter mailed to pt to come for repeat labs and to call office to schedule appt. Follow-up by: Abelino Derrick CMA Deborra Medina),  March 14, 2010 8:44 AM    Prescriptions: MERCAPTOPURINE 50 MG  TABS (MERCAPTOPURINE) Take 1 1/2 tablet by mouth once a day  #45 x 0   Entered by:   Abelino Derrick CMA ([place])   Authorized by:   Gatha Mayer MD, [facility]   Signed by:   Abelino Derrick CMA ([place]) on 03/12/2010   Method used:   Electronically to        [id number] (retail)       [address] [place], [place]  [address]       Ph: [phone]       Fax: [phone]   RxID:   [id number]

## 2010-07-10 NOTE — Progress Notes (Signed)
  Phone Note Outgoing Call   Summary of Call: Need to contact patient and family as well as MD's re: no surgery now  Follow-up for Phone Call        called on 6/13 and no answer see other phone note of 6/15 Follow-up by: Gatha Mayer MD, [facility],  November 22, 2009 7:06 PM

## 2010-07-10 NOTE — Assessment & Plan Note (Signed)
Summary: follow up crohn's/sheri   History of Present Illness Visit Type: Follow-up Visit Primary GI MD: Silvano Rusk MD [facility] Primary Cleo Villamizar: Rowe Clack MD Requesting Judyann Casasola: n/a Chief Complaint: Crohn's History of Present Illness:   77 yo ww with Crohn's ileitis on 6MP. Here with son, [name], a veternarian, again today. She denies andy particular problems with her Crohn's, having about 2 stools a day and no significant abdominal pain. Her son remains concerned about the "mass" that was palpated and seem at laparoscopy. She has had a pesary paced with some success, as repair of her bladder prolapse was deemed imprudent given the chronic inflammation seen at laparoscopy.     GI Review of Systems      Denies abdominal pain, acid reflux, belching, bloating, chest pain, dysphagia with liquids, dysphagia with solids, heartburn, loss of appetite, nausea, vomiting, vomiting blood, weight loss, and  weight gain.        Denies anal fissure, black tarry stools, change in bowel habit, constipation, diarrhea, diverticulosis, fecal incontinence, heme positive stool, hemorrhoids, irritable bowel syndrome, jaundice, light color stool, liver problems, rectal bleeding, and  rectal pain.    Current Medications (verified): 1)  Mercaptopurine 50 Mg  Tabs (Mercaptopurine) .... Take 1 Tablet By Mouth Once A Day 2)  Aspir-Low 81 Mg Tbec (Aspirin) .... Once Daily 3)  Multivitamins  Tabs (Multiple Vitamin) .... Once Daily 4)  Vitamin D3 1000 Unit Tabs (Cholecalciferol) .... Once Daily 5)  Fosamax 70 Mg Tabs (Alendronate Sodium) .Marland Kitchen.. 1 By Mouth Every Week, Take 2 Hours Before Any Other Food or Medicines  Allergies (verified): 1)  ! * No Live Vaccines On 40m  Past History:  Past Medical History: Crohn's Ileitis Urinary Tract Infection-recurrent Prolapsed Bladder - pessary in Osteoporosis Pubic ramus fracture 03/2009 Hyperparathyroidism - Primary  MD roster: GI - gessner gyn -  silva uro - [facility] - [facility]/[place] [facility]-ellison  Past Surgical History: left Hip Surgery following fracture, prosthesis (2006) Rhinoplasty x 2 Tonsillectomy Laparoscopy leading to cancellation of bladder prolapse repair, 10/2009  Family History: Reviewed history from 10/17/2009 and no changes required. No FH of Colon Cancer: Family History of Heart Disease: Father no hyperparathyroidism.   mother had osteoporosis.  Social History: Reviewed history from 10/17/2009 and no changes required. 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  Alcohol Use - yes occ wine with spaghetti Daily Caffeine Use coffee Illicit Drug Use - no Patient does not get regular exercise.  3 adults live with her and husband. she is not happy with the boarders.  Review of Systems       Pesary has helped bladder prolapse though still with some incontinence  Vital Signs:  Patient profile:   77year old female Height:      65 inches Weight:      125 pounds BMI:     20.88 BSA:     1.62 Pulse rate:   68 / minute Pulse rhythm:   regular BP sitting:   132 / 76  (left arm) Cuff size:   regular  Vitals Entered By: KHope PigeonCMA (January 10, 2010 2:18 PM)  Physical Exam  General:  Well developed, well nourished, no acute distress. Eyes:  anicteric Abdomen:  fullness in RLQ no discrete mass, mildly tender BS+   Skin:  no rash Psych:  Alert and cooperative. Normal mood and affect.   Impression & Recommendations:  Problem # 1:  CROHN'S DISEASE, SMALL  INTESTINE (ICD-555.0) Assessment Unchanged Diagnosed by CT and colonoscopy 7/10 initially treated with prednisone She seems asymptomatic on 6MP (since 9/10), however I am not certain of her history in that I suspect she is a minimizer.  "mass issues" discussed with pt and son I believe this is Crohn's and unlikely to change much given stability from 2010 to 2011. Clinically seems ok. Is having  thiopurine metabolites (8/2) checked and will also get CBC and CMET.  She may need repeat imaging at some point but not convinced it is necessary. I tried my best to explain to son that available evidence points to some chronic changes of Crohn's in RLQ area and it may never resolve. Would wait 6 mos from last CT unless clinical changes prior to repeating, if we do.  Problem # 2:  ENCOUNTER FOR LONG-TERM USE OF OTHER MEDICATIONS (ICD-V58.69) Assessment: Unchanged  Orders: TLB-CBC Platelet - w/Differential (85025-CBCD) TLB-CMP (Comprehensive Metabolic Pnl) (09381-WEXH)  Problem # 3:  PRIMARY HYPERPARATHYROIDISM (ICD-252.01) Assessment: Unchanged she should consider GSU eval - we discussed this is not taking Fosamax  Problem # 4:  FATIGUE (ICD-780.79) Assessment: Unchanged ? cause has been chronic and TSH ok, Hgb has been if calcium high then could possibly have some relation?  Patient Instructions: 1)  Please go to the basement to have your lab tests drawn today.  2)  We will call you with further follow up after reviewing these results. 3)  Please schedule a follow-up appointment in 3 months.  Call sooner if you are having any problems. 4)  Copy sent to : Gwendolyn Grant, MD 5)  The medication list was reviewed and reconciled.  All changed / newly prescribed medications were explained.  A complete medication list was provided to the patient / caregiver. cc: Dr. Nicki Reaper McDiarmid and Dr. Josefa Half

## 2010-07-10 NOTE — Letter (Signed)
Summary: [facility] Consult Scheduled Letter  [facility] [address]   [place], [place] [address]   Phone: [phone]  Fax: [phone]      10/31/2009 MRN: [id number]  [facility] [address] [place], [place]  [address]    Dear Ms. Candy,      We have scheduled an appointment for you.  At the recommendation of Dr.Ellison, we have scheduled you a consult with Dr Barry Dienes on 11/15/09 at 8:30am.  Their phone number is [phone].  If this appointment day and time is not convenient for you, please feel free to call the office of the doctor you are being referred to at the number listed above and reschedule the appointment.     [facility] [address], [place] [address]    Thank you,  Patient Care Coordinator [facility]

## 2010-07-10 NOTE — Progress Notes (Signed)
Summary: Triage - legs swollen she ? if 6MP  Phone Note Call from Patient Call back at Home Phone [phone] Call back at [phone] Cell   Caller: Patient Call For: Dr. Carlean Purl Reason for Call: Talk to Nurse Summary of Call: Pt. feels like her toes and feet are swollen from the Mercaptopurine meds. Initial call taken by: Webb Laws,  August 29, 2009 11:57 AM  Follow-up for Phone Call        Left message for patient to call back [place], [facility]  August 29, 2009 3:24 PM  Additional Follow-up for Phone Call Additional follow up Details #1::        Pt. walked into the office, states she needs a refill of 6MP, BUT, wants a new med because she feels it is causing her feet to swell.  Dresser PLEASE ADVISE  Additional Follow-up by: [name] LPN,  March 22, 624THL 4:41 PM    Additional Follow-up for Phone Call Additional follow up Details #2::    That is very unlikely and will require an REV within a few days  to sort out (extender?) not sure she has a PCP yet Gatha Mayer MD, [facility]  August 30, 2009 8:54 AM   I spoke with the patient this am, she has some minor swelling in her feet and pain with ambulation.  Happens occasionally.  She ran out of 6 mp a few days ago.  Patient  was due for labs on 08/17/09 she didn't come for.  Patient  did cancel her appointment with Dr Asa Lente.  I reviewed with her the imprortance of a primary care provider and have asked her to call her office today and establish care.  I also reviewed with her she had a pelvic fx last fall  and have asked her to touch base with her orthopedic surgeon about possible selling from fracture.  Pt will come in today and see Amy Esterwood PA today at 2:30, she will go to labs prior to the appointment.  Dr Carlean Purl do you want additional lab work?  Please advise. Follow-up by: Barb Merino RN, [place],  August 30, 2009 10:25 AM  Additional Follow-up for Phone Call Additional follow up Details #3:: Details for  Additional Follow-up Action Taken: She can see me Monday and does not need to see amy today since she has an appt Mon w/me we can refill the ^MP x 1 month do CBC and CMET today   Patient  aware of Dr Celesta Aver above recommendations. I have reminded her of her appointment with Dr Carlean Purl on Monday 09/04/09 10:45.  She is to come to the lab today.  I have canceled the appointment today with Nicoletta Ba PA .  She is also reminded to reschedule her appointment with Dr Asa Lente.    Additional Follow-up by: Barb Merino RN, [place],  August 30, 2009 10:55 AM  New/Updated Medications: MERCAPTOPURINE 50 MG  TABS (MERCAPTOPURINE) Take 1 tablet by mouth once a day Prescriptions: MERCAPTOPURINE 50 MG  TABS (MERCAPTOPURINE) Take 1 tablet by mouth once a day  #30 x 0   Entered by:   Barb Merino RN, CGRN   Authorized by:   Gatha Mayer MD, [facility]   Signed by:   Barb Merino RN, CGRN on 08/30/2009   Method used:   Electronically to        [id number] (retail)       [address]  [place], [place]  [address]       Ph: [phone]       Fax: [phone]   RxID:   [id number]

## 2010-07-10 NOTE — Progress Notes (Signed)
Summary: Refill 6MP  Phone Note Call from Patient Call back at Home Phone [phone]   Caller: son Orlene Erm Call For: Carlean Purl Reason for Call: Refill Medication Summary of Call: Mother needs rx refill on Mercapurine called in to [facility].  Initial call taken by: Ronalee Red,  November 08, 2009 10:58 AM  Follow-up for Phone Call        pt has appt on 11/09/09.  Refill sent to pharmacy. Follow-up by: Abelino Derrick CMA Deborra Medina),  November 08, 2009 1:58 PM    Prescriptions: MERCAPTOPURINE 50 MG  TABS (MERCAPTOPURINE) Take 1 tablet by mouth once a day  #30 Tablet x 0   Entered by:   Abelino Derrick CMA ([place])   Authorized by:   Gatha Mayer MD, [facility]   Signed by:   Abelino Derrick CMA ([place]) on 11/08/2009   Method used:   Electronically to        [facility] (retail)       [address]       [place], [place]  [address]       Ph: [phone]       Fax: [phone]   RxID:   [id number]

## 2010-07-10 NOTE — Assessment & Plan Note (Signed)
Summary: f/u...as.   History of Present Illness Visit Type: Follow-up Visit Primary GI MD: Silvano Rusk MD [facility] Primary Provider:  n/a Requesting Provider: n/a Chief Complaint: Dr. Carlean Purl consultation, no problems per patient History of Present Illness:   77 yo white woman with Crohn's of the small bowel on 6MP. She recently dropped in our office complainig of lower extremity edema. Labs were drawn and calcium was elevated at 10.9. She was instructed to top calcium supplement. "I am fine and I am getting better"  "My son is so hard on me about what I am eating." He threw away some food of  others living in her home and that disrupted things.  Has bladder prolapse surgery planed 5/31 (McDiarmid and Quincy Simmonds) To see Dr. Asa Lente April 25 and says she will keep.   GI Review of Systems      Denies abdominal pain, acid reflux, belching, bloating, chest pain, dysphagia with liquids, dysphagia with solids, heartburn, loss of appetite, nausea, vomiting, vomiting blood, weight loss, and  weight gain.        Denies anal fissure, black tarry stools, change in bowel habit, constipation, diarrhea, diverticulosis, fecal incontinence, heme positive stool, hemorrhoids, irritable bowel syndrome, jaundice, light color stool, liver problems, rectal bleeding, and  rectal pain.    Current Medications (verified): 1)  Mercaptopurine 50 Mg  Tabs (Mercaptopurine) .... Take 1 Tablet By Mouth Once A Day 2)  Aspir-Low 81 Mg Tbec (Aspirin) .... Once Daily 3)  Multivitamins  Tabs (Multiple Vitamin) .... Once Daily 4)  Vitamin D3 1000 Unit Tabs (Cholecalciferol) .... Once Daily 5)  Calcium 600 1500 Mg Tabs (Calcium Carbonate) .... Once Daily  Allergies (verified): 1)  ! * No Live Vaccines On 23m  Past History:  Past Medical History: Reviewed history from 08/03/2009 and no changes required. Crohn's Ileitis Urinary Tract Infection-recurrent Prolapsed Bladder Osteoporosis Pubic ramus fracture 10/10  Past  Surgical History: Reviewed history from 01/04/2009 and no changes required. Hip Surgery following fracture, prosthesis Rhinoplasty x 2 Tonsillectomy  Family History: Reviewed history from 01/04/2009 and no changes required. 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  Alcohol Use - yes occ wine with spaghetti Daily Caffeine Use coffee Illicit Drug Use - no Patient does not get regular exercise.  3 adults live with her and husband she is not happy with the boarders  Vital Signs:  Patient profile:   77year old female Height:      65 inches Weight:      124.50 pounds BMI:     20.79 Pulse rate:   60 / minute Pulse rhythm:   regular BP sitting:   138 / 70  (left arm) Cuff size:   regular  Vitals Entered By: June McMurray CCenter [place](Deborra Medina (September 04, 2009 11:29 AM)  Physical Exam  General:  Well developed, well nourished, no acute distress.  Abdomen:  soft and non-tender, no masses Neurologic:  a&o x 3    Impression & Recommendations:  Problem # 1:  HYPERCALCEMIA (ICD-275.42) Assessment New Probably and hopefully due to supplementation. Will recheck first today but could need PTH level. vit D 29 (not bad) Orders: TLB-BMP (Basic Metabolic Panel-BMET) ([id number]  Problem # 2:  CROHN'S DISEASE, SMALL INTESTINE (ICD-555.0) Assessment: Unchanged Diagnosed by CT and colonoscopy 7/10 initially treated with prednisone She seems asymptomatic on 6MP (since 9/10)  Problem # 3:  HYPERGLYCEMIA (  ICD-790.29) Assessment: New BS 146 on BMET last week will recheck   Patient Instructions: 1)  Please go to the basement to have your lab tests drawn today.  2)  We will call you with results. 3)  The medication list was reviewed and reconciled.  All changed / newly prescribed medications were explained.  A complete medication list was provided to the patient /  caregiver. 15 minutes time spent with patient on these 3 problems  cc: Dr. Nicki Reaper McDiarmid, Dr. Josefa Half

## 2010-07-10 NOTE — Progress Notes (Signed)
Summary: Triage  Phone Note From Other Clinic   Caller: [facility] @ Dr. Quincy Simmonds  [phone] Call For: Dr. Carlean Purl Summary of Call: Pt. was referred here for Abnormal CT and would like to f/u on Dr. Celesta Aver recommendations  Initial call taken by: Webb Laws,  November 22, 2009 2:53 PM  Follow-up for Phone Call        Dr Carlean Purl the pt's son wallked into Dr Elza Rafter office today and was very upset that he had not heard from any one about his mothers mass.  Dr Quincy Simmonds is out of town until next week and his nurse Estill Bamberg would like you to call the family and let them know what the plan is for the pt. Follow-up by: Christian Mate CMA Deborra Medina),  November 22, 2009 3:25 PM  Additional Follow-up for Phone Call Additional follow up Details #1::        I had tried to call her earlier in the week and no answer. i did again now and no answer and left message. I have been able to speak to urologist. I will try again. Let Dr. Elza Rafter office no this. Thanks Additional Follow-up by: Gatha Mayer MD, Marval Regal,  November 22, 2009 7:05 PM    Additional Follow-up for Phone Call Additional follow up Details #2::    Dr Elza Rafter office is aware Follow-up by: Christian Mate CMA Deborra Medina),  November 23, 2009 8:06 AM

## 2010-07-10 NOTE — Progress Notes (Signed)
Summary: endo refer  Phone Note Outgoing Call   Summary of Call: please let pt know her labs do show overactive hyperparathyroid glands causing high Calcium levels and she needs to see and endocrinologist for tx of same - refer made to [facility] will arrange and contact pt once appt set - thanks Initial call taken by: Rowe Clack MD,  October 02, 2009 6:35 PM  Follow-up for Phone Call        Called pt concerning labs. pt decline to make appt to see endo right now. she states she has no insurance and want to discuss with husbanc & her son first. will call back to make appt if she decides to see md concerning her thyroid Follow-up by: Tomma Lightning,  October 03, 2009 11:14 AM  Additional Follow-up for Phone Call Additional follow up Details #1::        Not sure why this is on my desktop. But..... I would get a sestamibi scan and also refer to surgery. Additional Follow-up by: Neena Rhymes MD,  October 03, 2009 1:04 PM

## 2010-07-10 NOTE — Assessment & Plan Note (Signed)
Summary: NEW ENDO HYPERPARATHY--PER FLAG/MP--MEDICARE-STC   Vital Signs:  Patient profile:   77 year old female Height:      65 inches (165.10 cm) Weight:      126.50 pounds (57.50 kg) O2 Sat:      97 % on Room air Temp:     98.0 degrees F (36.67 degrees C) oral Pulse rate:   67 / minute BP sitting:   112 / 74  (left arm) Cuff size:   regular  Vitals Entered By: Gardenia Phlegm RMA (Oct 17, 2009 10:20 AM)  O2 Flow:  Room air CC: New Endo: Hyperpathy/ CF Is Patient Diabetic? No   Referring Provider:  n/a Primary Provider:  Rowe Clack MD  CC:  New Endo: Hyperpathy/ CF.  History of Present Illness: pt suffered a fall 2010, with pelvic fx. she says the fall was mild trauma.  she had another pelvic fx in the 1970's, and a hip fx 2006. in w/u of this, she was found to have ospeoporosis.  she has just started fosamax. in w/u of this, she was found to have vitamin-d deficiency. she was dx'ed with crohn's dz in 2010. ca++ was normal july, 2010, but a repeat was elevated.  w/u of this showed hyperparathyroidism.   symptomatically, pt states few years of intermittent abdominal pain, and associated fatigue.  she is much improved since she started 6-mp, rx'ed by dr Carlean Purl.   she will have tah for bladder prolapse.    Current Medications (verified): 1)  Mercaptopurine 50 Mg  Tabs (Mercaptopurine) .... Take 1 Tablet By Mouth Once A Day 2)  Aspir-Low 81 Mg Tbec (Aspirin) .... Once Daily 3)  Multivitamins  Tabs (Multiple Vitamin) .... Once Daily 4)  Vitamin D3 1000 Unit Tabs (Cholecalciferol) .... Once Daily 5)  Calcium 600 1500 Mg Tabs (Calcium Carbonate) .... Once Daily 6)  Fosamax 70 Mg Tabs (Alendronate Sodium) .Marland Kitchen.. 1 By Mouth Every Week, Take 2 Hours Before Any Other Food or Medicines  Allergies (verified): 1)  ! * No Live Vaccines On 54mp  Past History:  Past Medical History: Last updated: 10/02/2009 Crohn's Ileitis Urinary Tract Infection-recurrent Prolapsed  Bladder Osteoporosis Pubic ramus fracture 03/2009  MD rooster: GI - gessner gyn - [facility] uro - [facility] ortho - [facility]/[place]  Family History: Reviewed history from 01/04/2009 and no changes required. No FH of Colon Cancer: Family History of Heart Disease: Father no hyperparathyroidism.   mother had osteoporosis.  Social History: Reviewed history from 10/02/2009 and no changes required. Married, 1 son Chief of Staff, 2 girls (1 is Immunologist)  Retired Patient has never smoked.  Alcohol Use - yes occ wine with spaghetti Daily Caffeine Use coffee Illicit Drug Use - no Patient does not get regular exercise.  3 adults live with her and husband. she is not happy with the boarders.  Review of Systems       denies weight loss, galactorrhea, hematuria, memory loss, menopausal sxs, numbness, skin rash, visual loss, and sob.  she has a few arthralgias, urinary frequency, depression, and muscle weakness.  diarrhea is improved.   Physical Exam  General:  normal appearance.   Head:  head: no deformity eyes: no periorbital swelling, no proptosis external nose and ears are normal mouth: no lesion seen Neck:  Supple without thyroid enlargement or tenderness.  Chest Wall:  no kyphosis Lungs:  Clear to auscultation bilaterally. Normal respiratory effort.  Heart:  Regular rate and rhythm without murmurs or gallops noted. Normal S1,S2.   Abdomen:  abdomen is soft, nontender.  no hepatosplenomegaly.   not distended.  no hernia  Msk:  muscle bulk and strength are grossly normal.  no obvious joint swelling.  gait is normal and steady  Extremities:  no edema no deformity Neurologic:  cn 2-12 grossly intact.   readily moves all 4's.   sensation is intact to touch on the feet  Skin:  normal texture and temp.  no rash.  not diaphoretic  Cervical Nodes:  No significant adenopathy.  Psych:  Alert and cooperative; normal mood and affect; normal attention span and concentration.   Additional  Exam:  test results are reviewed:  july, 2010: Alkaline Phosphatase      68 U/L                      39-117 Calcium                   9.9 mg/dL Vitamin D (25-Hydroxy)    [L]  10 ng/mL   feb, 2011: Calcium     [H]  10.8 mg/dL    march, 2011: Vitamin D (25-Hydroxy)       [L]  27 ng/mL    april, 2011: Parathyroid Hormone  [H]  183.4 pg/mL                 14.0-72.0   Calcium              [H]  10.6 mg/dL  may, 2011: Parathyroid Hormone  [H]  165.6 pg/mL                 14.0-72.0 Calcium              [H]  11.4 mg/dL     Impression & Recommendations:  Problem # 1:  PRIMARY HYPERPARATHYROIDISM (ICD-252.01) this was probably unmasked by rx of #2 (tertiary hyperparathyroidism is very unlikely in view of normal alk phos).  Problem # 2:  VITAMIN D DEFICIENCY (ICD-268.9) Assessment: Improved due to #3  Problem # 3:  CROHN'S DISEASE, SMALL INTESTINE (ICD-555.0) Assessment: Improved  Problem # 4:  OSTEOPOROSIS (A999333) uncertain relationship to the above  Problem # 5:  BLADDER PROLAPSE (ICD-596.9) she will have surgery for this soon  Other Orders: Surgical Referral (Surgery) Consultation Level IV [id number])  Patient Instructions: 1)  cc dr Quincy Simmonds and macdiarmid. 2)  it is critically important to prevent falling down (keep floor areas well-lit, dry, and free of loose objects). 3)  drink plenty of fluids. 4)  continue fosamax. 5)  continue vitamin-d supplement, but you do not need to take the calcium pills. 6)  in my opinion, this calcium does not significantly increase the risk of your upcoming gynecologic surgery, but this question is best answered by an anesthesiologist. 7)  in my opinion, you should undergo parathyroid surgery.  please let me know if you wish to see a surgeon.

## 2010-07-10 NOTE — Assessment & Plan Note (Signed)
Summary: NEW/ NO INSURANCE / MEDICARE A/ NWS  #   Vital Signs:  Patient profile:   77 year old female Height:      65 inches (165.10 cm) Weight:      126.0 pounds (57.27 kg) O2 Sat:      96 % on Room air Temp:     98.2 degrees F (36.78 degrees C) oral Pulse rate:   62 / minute BP sitting:   126 / 72  (left arm) Cuff size:   regular  Vitals Entered By: Tomma Lightning (October 02, 2009 2:38 PM)  O2 Flow:  Room air CC: New patient Is Patient Diabetic? No Pain Assessment Patient in pain? no        Primary Care Provider:  Rowe Clack MD  CC:  New patient.  History of Present Illness: new pt to me and our division, here to est care - follows with GI - gessner in our group here with son today  1) crohn's dz, complicated by SBO [id number] at time of dx. reports compliance with ongoing medical treatment and no changes in medication dose or frequency. denies adverse side effects related to current therapy.  occ BRBPR, no abd pain or cramping or fever  2) kidney dz - bilateral hydro follows with uro - mcdirmond - uro problem on kidneys caused by cystocele per pt - planning pelvic wall surg repair 11/07/09 with gyn - silva minimizes any symptoms of urinary incontinence   3) hypercalcemia - noted on routine labs - told to stop Ca supp last week due to same no muscle weakness or depression - no hx malignancy or endo problems - denies bine pain at this time  4) ?osteoporosis - pt denies this dx + fx x 2 with hx falls, most recent last year - inf pubic rami fx 03/2009 enjoys walking but denies regular exercise takes vit d supp but off CA x 1 week (see above)   Preventive Screening-Counseling & Management  Alcohol-Tobacco     Alcohol drinks/day: 0     Alcohol Counseling: not indicated; patient does not drink     Smoking Status: never     Tobacco Counseling: not indicated; no tobacco use  Caffeine-Diet-Exercise     Caffeine Counseling: not indicated; caffeine use is not  excessive or problematic     Nutrition Referrals: no     Exercise Counseling: to improve exercise regimen     Depression Counseling: not indicated; screening negative for depression  Safety-Violence-Falls     Seat Belt Counseling: not indicated; patient wears seat belts     Helmet Counseling: not applicable     Firearms in the Home: firearms in the home     Firearm Counseling: not indicated; uses recommended firearm safety measures     Smoke Detectors: yes     Smoke Detector Counseling: n/a     Violence in the Home: no risk noted     Fall Risk Counseling: counseling provided; falls with injury noted  Clinical Review Panels:  Prevention   Last Pap Smear:  Interpretation /Result:Negative for intraepithelial Lesion or Malignancy.    (04/20/2009)   Last Colonoscopy:  Location:  [place].  Impression: Iletis-chron's in the ileum BXS compatible with ?Chron, ischemia NSAID injury also in differential 2. Moderate diverticulitis in sigmoid colon 3. Internal hemorrhoids (12/23/2008)  CBC   WBC:  4.8 (08/30/2009)   RBC:  4.01 (08/30/2009)   Hgb:  13.4 (08/30/2009)   Hct:  39.8 (08/30/2009)   Platelets:  220.0 (08/30/2009)   MCV  99.3 (08/30/2009)   MCHC  33.6 (08/30/2009)   RDW  13.0 (08/30/2009)   PMN:  69.4 (08/30/2009)   Lymphs:  19.1 (08/30/2009)   Monos:  8.0 (08/30/2009)   Eosinophils:  2.0 (08/30/2009)   Basophil:  1.5 (08/30/2009)  Complete Metabolic Panel   Glucose:  91 (09/04/2009)   Sodium:  140 (09/04/2009)   Potassium:  4.4 (09/04/2009)   Chloride:  106 (09/04/2009)   CO2:  29 (09/04/2009)   BUN:  14 (09/04/2009)   Creatinine:  0.8 (09/04/2009)   Albumin:  3.7 (08/30/2009)   Total Protein:  6.6 (08/30/2009)   Calcium:  10.9 (09/04/2009)   Total Bili:  0.6 (08/30/2009)   Alk Phos:  84 (08/30/2009)   SGPT (ALT):  17 (08/30/2009)   SGOT (AST):  19 (08/30/2009)   Current Medications (verified): 1)  Mercaptopurine 50 Mg  Tabs (Mercaptopurine)  .... Take 1 Tablet By Mouth Once A Day 2)  Aspir-Low 81 Mg Tbec (Aspirin) .... Once Daily 3)  Multivitamins  Tabs (Multiple Vitamin) .... Once Daily 4)  Vitamin D3 1000 Unit Tabs (Cholecalciferol) .... Once Daily 5)  Calcium 600 1500 Mg Tabs (Calcium Carbonate) .... Once Daily  Allergies (verified): 1)  ! * No Live Vaccines On 56mp  Past History:  Past Medical History: Crohn's Ileitis Urinary Tract Infection-recurrent Prolapsed Bladder Osteoporosis Pubic ramus fracture 03/2009  MD rooster: GI - gessner gyn - silva uro - [facility] ortho - [facility]/[place]  Past Surgical History: left Hip Surgery following fracture, prosthesis (2006) Rhinoplasty x 2 Tonsillectomy  Family History: Reviewed history from 01/04/2009 and no changes required. 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  Alcohol Use - yes occ wine with spaghetti Daily Caffeine Use coffee Illicit Drug Use - no Patient does not get regular exercise.  3 adults live with her and husband  she is not happy with the boarders  Review of Systems       see HPI above. I have reviewed all other systems and they were negative.   Physical Exam  General:  alert, well-developed, well-nourished, and cooperative to examination.   son at side  Eyes:  vision grossly intact; pupils equal, round and reactive to light.  conjunctiva and lids normal.    Ears:  normal pinnae bilaterally, without erythema, swelling, or tenderness to palpation. TMs clear, without effusion, or cerumen impaction. Hearing grossly normal bilaterally  Mouth:  teeth and gums in good repair; mucous membranes moist, without lesions or ulcers. oropharynx clear without exudate, no erythema.  Neck:  supple, full ROM, no masses, no thyromegaly; no thyroid nodules or tenderness. no JVD or carotid bruits.   Lungs:  normal respiratory effort,  no intercostal retractions or use of accessory muscles; normal breath sounds bilaterally - no crackles and no wheezes.    Heart:  normal rate, regular rhythm, no murmur, and no rub. BLE without edema.  Abdomen:  soft, non-tender, normal bowel sounds, no distention; no masses and no appreciable hepatomegaly or splenomegaly.   Genitalia:  defer to gyn  Msk:  No deformity or scoliosis noted of thoracic or lumbar spine.   Neurologic:  alert & oriented X3 and cranial nerves II-XII symetrically intact.  strength normal in all extremities, sensation intact to light touch, and gait normal. speech fluent without dysarthria or aphasia; follows commands with good comprehension.  Skin:  no rashes, vesicles, ulcers, or erythema. No nodules or irregularity to palpation.  Psych:  Oriented X3, memory intact for recent and remote, normally interactive, good eye contact, not anxious appearing, not depressed appearing, and not agitated.      Impression & Recommendations:  Problem # 1:  FATIGUE (ICD-780.79) nonsp hx and exam -  recent labs ok - check TSH now eval calcium issues - see next Orders: TLB-TSH (Thyroid Stimulating Hormone) (84443-TSH)  Problem # 2:  HYPERCALCEMIA (ICD-275.42) may need endo eval - cont to hold oral calcium until labs return Orders: T-Parathyroid Hormone, Intact w/ Calcium [id number]) TLB-Magnesium (Mg) (83735-MG) T-Bone Densitometry [id number]) T-Lumbar Vertebral Assessment [id number]) Endocrinology Referral (Endocrine)    addendum -  prev labs reviewed - +hyperparathyroidism -  had appt to see endo - [facility] 4/26 -for same - but cancelled to see me - will re-refer to endo at this time  Problem # 3:  CROHN'S DISEASE, SMALL INTESTINE (ICD-555.0) stable - cont current med tx as per GI  Problem # 4:  BLADDER PROLAPSE (ICD-596.9) planning surg repair of same May 2011 by gyn + uro -   Time spent with patient and son 45 minutes, more than 50% of this time was spent reviewing  options for mgmt of prolapse as discussed with uro and gyn previously, need for eval of calcium levels with more labs and plans to f/u on status of probable osteoporosis  Complete Medication List: 1)  Mercaptopurine 50 Mg Tabs (Mercaptopurine) .... Take 1 tablet by mouth once a day 2)  Aspir-low 81 Mg Tbec (Aspirin) .... Once daily 3)  Multivitamins Tabs (Multiple vitamin) .... Once daily 4)  Vitamin D3 1000 Unit Tabs (Cholecalciferol) .... Once daily 5)  Calcium 600 1500 Mg Tabs (Calcium carbonate) .... Once daily  Patient Instructions: 1)  it was good to see you today. 2)  test(s) ordered today - your results will be posted on the phone tree for review in 48-72 hours from the time of test completion; call [phone] and enter your 9 digit MRN (listed above on this page, just below your name); if any changes need to be made or there are abnormal results, you will be contacted directly.  3)  continue to hold calcium supplements until we get more information back about your calcium levels - we may need opinion from endocrinologist on best managment of this and can make referral if needed 4)  we'll make referral for bone density testing. Our office will contact you regarding this appointment once made.  5)  Please schedule a follow-up appointment in 6 months, sooner if problems.    Pap Smear  Procedure date:  04/20/2009  Findings:      Interpretation /Result:Negative for intraepithelial Lesion or Malignancy.     Colonoscopy  Procedure date:  12/23/2008  Findings:      Location:  [place].  Impression: Iletis-chron's in the ileum BXS compatible with ?Chron, ischemia NSAID injury also in differential 2. Moderate diverticulitis in sigmoid colon 3. Internal hemorrhoids

## 2010-07-10 NOTE — Progress Notes (Signed)
Summary: [facility]-Hemolysed Specimen  Phone Note Outgoing Call   Call placed by: Abelino Derrick CMA Deborra Medina),  January 16, 2010 4:07 PM Summary of Call: I spoke to patient and let her know that [facility] was unable to perform test.  Pt is understanding.  She will return to the lab this week for repeat blood draw.  Per Anderson Malta pt will not be charged.  order entered into ID[facility]. Initial call taken by: Abelino Derrick CMA Deborra Medina),  January 16, 2010 4:15 PM

## 2010-07-12 NOTE — Progress Notes (Signed)
Summary: speak to nurse  Phone Note Call from Patient Call back at [phone]   Caller: Chesley Noon Call For: Dr Carlean Purl Reason for Call: Talk to Nurse Summary of Call: Son needs to speak to a nurse regarding his mothers health, would like to speak ot someone today, Initial call taken by: Ronalee Red,  June 21, 2010 4:27 PM  Follow-up for Phone Call        Patient's son went to visit his mom and found that she has not been taking her 6MP probably since early december.  I have notified him that we have been trying to reach her since October.  I  have asked him to have her come next week for labs.  She is asked to resume her 6MP.  She is scheduled for a follow up appointment on 07/06/10 with Dr Carlean Purl.  Son has asked that we also contact him for any questions with his mom. She agrees it is ok for [place] to speak with him. Follow-up by: Barb Merino RN, [place],  June 21, 2010 4:57 PM

## 2010-07-12 NOTE — Assessment & Plan Note (Signed)
Summary: follow up crohn's Barbera Setters   History of Present Illness Visit Type: Follow-up Visit Primary GI MD: Silvano Rusk MD [facility] Primary Provider: Rowe Clack MD Requesting Provider: n/a Chief Complaint: Follow up from Crohns, No GI Complaints History of Present Illness:   77 yo ww with Crohn's ileitis on 6MP. She returns for folow-up. She had to stop taking her 6 MP at one point due to financial problems. She was off it fr about 40 days. She is not having any bowel or abdominal symptoms. Pesary continues to control urinary problems. No infectious illnesses except mild "cold" in past few weeks. she has not had influenza vaccine. She remains tired and fatigued and joints are stiff but not swollen.       GI Review of Systems      Denies abdominal pain, acid reflux, belching, bloating, chest pain, dysphagia with liquids, dysphagia with solids, heartburn, loss of appetite, nausea, vomiting, vomiting blood, weight loss, and  weight gain.        Denies anal fissure, black tarry stools, change in bowel habit, constipation, diarrhea, diverticulosis, fecal incontinence, heme positive stool, hemorrhoids, irritable bowel syndrome, jaundice, light color stool, liver problems, rectal bleeding, and  rectal pain.    Allergies: 1)  ! * No Live Vaccines On 48m  Past History:  Past Medical History: Last updated: 01/10/2010 Crohn's Ileitis Urinary Tract Infection-recurrent Prolapsed Bladder - pessary in Osteoporosis Pubic ramus fracture 03/2009 Hyperparathyroidism - Primary  MD roster: GI - gessner gyn - silva uro - m[facility]irmond ortho - [facility]/[place] endo-[facility]  Past Surgical History: Last updated: 01/10/2010 left Hip Surgery following fracture, prosthesis (2006) Rhinoplasty x 2 Tonsillectomy Laparoscopy leading to cancellation of bladder prolapse repair, 10/2009  Family History: Last updated: 10/17/2009 No FH of Colon Cancer: Family History of Heart Disease:  Father no hyperparathyroidism.   mother had osteoporosis.  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  Alcohol Use - yes occ wine with spaghetti Daily Caffeine Use coffee Illicit Drug Use - no Patient does not get regular exercise.  3 adults live with her and husband. she is not happy with the boarders.  Vital Signs:  Patient profile:   77year old female Height:      65 inches Weight:      128 pounds BMI:     21.38 BSA:     1.64 Pulse rate:   76 / minute Pulse rhythm:   regular BP sitting:   128 / 66  (left arm)  Vitals Entered By: RJim Hogg(Deborra Medina (July 06, 2010 3:03 PM)  Physical Exam  General:  Well developed, well nourished, no acute distress. Lungs:  Clear to auscultation bilaterally. Normal respiratory effort.  Heart:  Regular rate and rhythm without  gallops noted. Normal SU2,G2   2/6 systolic murmur RUSB   Impression & Recommendations:  Problem # 1:  CROHN'S DISEASE, SMALL INTESTINE (ICD-555.0) Assessment Unchanged Diagnosed by CT and colonoscopy 7/10 initially treated with prednisone TPMT phenotype normal She seems asymptomatic on 6MP (since 9/10). She has had some non-compliance due to financial problems. Nothing different at thispoint, I think she is about as good as she can be.   Orders: TLB-B12, Serum-Total ONLY (([id number] - she is at risk of malabsorption, MCV is up also  Problem # 2:  ENCOUNTER FOR LONG-TERM USE OF OTHER MEDICATIONS (ICD-V58.69) Assessment: Unchanged declined influenza vaccine no toxicity seen on labs  Problem # 3:  FATIGUE (ICD-780.79) Assessment: Unchanged  Patient Instructions: 1)  Please go to our basement lab for labs today. 2)  Please schedule a follow-up appointment in 6-9 months. 3)  You will need repeat labs in April we will give you a call. 4)  Continue current medications. 5)  The medication list was reviewed and  reconciled.  All changed / newly prescribed medications were explained.  A complete medication list was provided to the patient / caregiver.

## 2010-07-27 ENCOUNTER — Telehealth: Payer: Self-pay | Admitting: Internal Medicine

## 2010-08-01 NOTE — Progress Notes (Signed)
Summary: Med refill  Phone Note Call from Patient Call back at Home Phone [phone]   Caller: Patient Call For: Dr. Carlean Purl Reason for Call: Talk to Nurse Summary of Call: Patient needs a refill on mercaptopurine to [facility] Initial call taken by: Martinique Johnson,  July 27, 2010 11:30 AM    Prescriptions: MERCAPTOPURINE 50 MG  TABS (MERCAPTOPURINE) Take 1 1/2 tablet by mouth once a day  #45 Tablet x 1   Entered by:   Randye Lobo NCMA   Authorized by:   Gatha Mayer MD, [facility]   Signed by:   Randye Lobo NCMA on 07/27/2010   Method used:   Electronically to        [id number] (retail)       [address] [place], [place]  [address]       Ph: [phone]       Fax: [phone]   RxID:   [id number]

## 2010-08-27 LAB — PROTIME-INR: INR: 1.07 (ref 0.00–1.49)

## 2010-08-27 LAB — URINALYSIS, ROUTINE W REFLEX MICROSCOPIC
Bilirubin Urine: NEGATIVE
Nitrite: NEGATIVE
Specific Gravity, Urine: <1.005 — ABNORMAL LOW (ref 1.005–1.030)
Urobilinogen, UA: 0.2 mg/dL (ref 0.0–1.0)
pH: 6 (ref 5.0–8.0)

## 2010-08-27 LAB — TYPE AND SCREEN: Antibody Screen: NEGATIVE

## 2010-08-27 LAB — COMPREHENSIVE METABOLIC PANEL
ALT: 18 U/L (ref 0–35)
Alkaline Phosphatase: 83 U/L (ref 39–117)
BUN: 11 mg/dL (ref 6–23)
Chloride: 110 mEq/L (ref 96–112)
Glucose, Bld: 94 mg/dL (ref 70–99)
Potassium: 4 mEq/L (ref 3.5–5.1)
Sodium: 139 mEq/L (ref 135–145)
Total Bilirubin: 0.6 mg/dL (ref 0.3–1.2)
Total Protein: 6.9 g/dL (ref 6.0–8.3)

## 2010-08-27 LAB — URINE MICROSCOPIC-ADD ON

## 2010-08-27 LAB — CBC
Hemoglobin: 13.2 g/dL (ref 12.0–15.0)
RBC: 3.79 MIL/uL — ABNORMAL LOW (ref 3.87–5.11)
WBC: 5 10*3/uL (ref 4.0–10.5)

## 2010-08-27 LAB — APTT: aPTT: 33 seconds (ref 24–37)

## 2010-08-27 LAB — ABO/RH: ABO/RH(D): O POS

## 2010-09-13 LAB — BASIC METABOLIC PANEL
BUN: 12 mg/dL (ref 6–23)
BUN: 7 mg/dL (ref 6–23)
BUN: 8 mg/dL (ref 6–23)
CO2: 29 mEq/L (ref 19–32)
CO2: 21 mEq/L (ref 19–32)
CO2: 25 mEq/L (ref 19–32)
Calcium: 10.8 mg/dL — ABNORMAL HIGH (ref 8.4–10.5)
Calcium: 10 mg/dL (ref 8.4–10.5)
Calcium: 9.3 mg/dL (ref 8.4–10.5)
Calcium: 9.3 mg/dL (ref 8.4–10.5)
Calcium: 9.4 mg/dL (ref 8.4–10.5)
Calcium: 9.6 mg/dL (ref 8.4–10.5)
Calcium: 9.9 mg/dL (ref 8.4–10.5)
Chloride: 104 mEq/L (ref 96–112)
Chloride: 110 mEq/L (ref 96–112)
Chloride: 112 mEq/L (ref 96–112)
Creatinine, Ser: 0.91 mg/dL (ref 0.4–1.2)
Creatinine, Ser: 0.69 mg/dL (ref 0.4–1.2)
Creatinine, Ser: 0.84 mg/dL (ref 0.4–1.2)
Creatinine, Ser: 0.91 mg/dL (ref 0.4–1.2)
GFR calc Af Amer: >60 mL/min (ref >60)
GFR calc Af Amer: >60 mL/min (ref >60)
GFR calc Af Amer: >60 mL/min (ref >60)
GFR calc Af Amer: >60 mL/min (ref >60)
GFR calc non Af Amer: >60 mL/min (ref >60)
GFR calc non Af Amer: 56 mL/min — ABNORMAL LOW (ref >60)
GFR calc non Af Amer: 60 mL/min — ABNORMAL LOW (ref >60)
GFR calc non Af Amer: >60 mL/min (ref >60)
GFR calc non Af Amer: >60 mL/min (ref >60)
GFR calc non Af Amer: >60 mL/min (ref >60)
Glucose, Bld: 105 mg/dL — ABNORMAL HIGH (ref 70–99)
Glucose, Bld: 102 mg/dL — ABNORMAL HIGH (ref 70–99)
Glucose, Bld: 104 mg/dL — ABNORMAL HIGH (ref 70–99)
Glucose, Bld: 114 mg/dL — ABNORMAL HIGH (ref 70–99)
Glucose, Bld: 124 mg/dL — ABNORMAL HIGH (ref 70–99)
Glucose, Bld: 99 mg/dL (ref 70–99)
Potassium: 3.8 mEq/L (ref 3.5–5.1)
Potassium: 4.2 mEq/L (ref 3.5–5.1)
Sodium: 135 mEq/L (ref 135–145)
Sodium: 135 mEq/L (ref 135–145)
Sodium: 135 mEq/L (ref 135–145)
Sodium: 136 mEq/L (ref 135–145)
Sodium: 139 mEq/L (ref 135–145)

## 2010-09-13 LAB — CBC
HCT: 27.4 % — ABNORMAL LOW (ref 36.0–46.0)
HCT: 34 % — ABNORMAL LOW (ref 36.0–46.0)
HCT: 34.6 % — ABNORMAL LOW (ref 36.0–46.0)
Hemoglobin: 10 g/dL — ABNORMAL LOW (ref 12.0–15.0)
Hemoglobin: 10.1 g/dL — ABNORMAL LOW (ref 12.0–15.0)
Hemoglobin: 11.8 g/dL — ABNORMAL LOW (ref 12.0–15.0)
Hemoglobin: 11.8 g/dL — ABNORMAL LOW (ref 12.0–15.0)
MCHC: 34.7 g/dL (ref 30.0–36.0)
MCHC: 34.2 g/dL (ref 30.0–36.0)
MCHC: 34.6 g/dL (ref 30.0–36.0)
MCHC: 34.6 g/dL (ref 30.0–36.0)
MCV: 90.4 fL (ref 78.0–100.0)
MCV: 90.5 fL (ref 78.0–100.0)
Platelets: 295 10*3/uL (ref 150–400)
Platelets: 155 10*3/uL (ref 150–400)
Platelets: 416 10*3/uL — ABNORMAL HIGH (ref 150–400)
RBC: 3.22 MIL/uL — ABNORMAL LOW (ref 3.87–5.11)
RBC: 3.24 MIL/uL — ABNORMAL LOW (ref 3.87–5.11)
RBC: 3.75 MIL/uL — ABNORMAL LOW (ref 3.87–5.11)
RDW: 13 % (ref 11.5–15.5)
RDW: 13.7 % (ref 11.5–15.5)
RDW: 13.8 % (ref 11.5–15.5)
RDW: 13.9 % (ref 11.5–15.5)
RDW: 14 % (ref 11.5–15.5)
RDW: 14.3 % (ref 11.5–15.5)
WBC: 6 10*3/uL (ref 4.0–10.5)
WBC: 6.3 10*3/uL (ref 4.0–10.5)

## 2010-09-13 LAB — DIFFERENTIAL
Basophils Absolute: 0 10*3/uL (ref 0.0–0.1)
Basophils Absolute: 0 10*3/uL (ref 0.0–0.1)
Basophils Absolute: 0 10*3/uL (ref 0.0–0.1)
Basophils Relative: 0 % (ref 0–1)
Basophils Relative: 0 % (ref 0–1)
Eosinophils Relative: 3 % (ref 0–5)
Lymphocytes Relative: 9 % — ABNORMAL LOW (ref 12–46)
Lymphocytes Relative: 8 % — ABNORMAL LOW (ref 12–46)
Lymphocytes Relative: 9 % — ABNORMAL LOW (ref 12–46)
Lymphs Abs: 0.5 10*3/uL — ABNORMAL LOW (ref 0.7–4.0)
Monocytes Absolute: 0.6 10*3/uL (ref 0.1–1.0)
Neutro Abs: 4.3 10*3/uL (ref 1.7–7.7)
Neutro Abs: 5 10*3/uL (ref 1.7–7.7)
Neutrophils Relative %: 80 % — ABNORMAL HIGH (ref 43–77)

## 2010-09-13 LAB — FOLATE: Folate: 5.6 ng/mL

## 2010-09-13 LAB — CULTURE, BLOOD (ROUTINE X 2): Culture: NO GROWTH

## 2010-09-13 LAB — ABO/RH: ABO/RH(D): O POS

## 2010-09-13 LAB — HEPATIC FUNCTION PANEL
AST: 15 U/L (ref 0–37)
Albumin: 2 g/dL — ABNORMAL LOW (ref 3.5–5.2)
Alkaline Phosphatase: 91 U/L (ref 39–117)
Total Bilirubin: 0.9 mg/dL (ref 0.3–1.2)
Total Protein: 4.8 g/dL — ABNORMAL LOW (ref 6.0–8.3)

## 2010-09-13 LAB — TROPONIN I: Troponin I: 0.08 ng/mL — ABNORMAL HIGH (ref 0.00–0.06)

## 2010-09-13 LAB — IRON AND TIBC
Iron: 19 ug/dL — ABNORMAL LOW (ref 42–135)
UIBC: 137 ug/dL

## 2010-09-13 LAB — RETICULOCYTES
RBC.: 3.08 MIL/uL — ABNORMAL LOW (ref 3.87–5.11)
Retic Count, Absolute: 24.6 10*3/uL (ref 19.0–186.0)
Retic Ct Pct: 0.8 % (ref 0.4–3.1)

## 2010-09-13 LAB — URINALYSIS, ROUTINE W REFLEX MICROSCOPIC
Bilirubin Urine: NEGATIVE
Glucose, UA: NEGATIVE mg/dL
Ketones, ur: NEGATIVE mg/dL
Nitrite: POSITIVE — AB
Protein, ur: 100 mg/dL — AB
pH: 7.5 (ref 5.0–8.0)

## 2010-09-13 LAB — PROTIME-INR: Prothrombin Time: 14.8 seconds (ref 11.6–15.2)

## 2010-09-13 LAB — URINE CULTURE: Colony Count: >=100000

## 2010-09-13 LAB — LIPID PANEL
LDL Cholesterol: 115 mg/dL — ABNORMAL HIGH (ref 0–99)
Triglycerides: 180 mg/dL — ABNORMAL HIGH (ref <150)

## 2010-09-13 LAB — CARDIAC PANEL(CRET KIN+CKTOT+MB+TROPI)
Relative Index: 0.5 (ref 0.0–2.5)
Total CK: 100 U/L (ref 7–177)

## 2010-09-13 LAB — TSH: TSH: 1.918 u[IU]/mL (ref 0.350–4.500)

## 2010-09-13 LAB — TYPE AND SCREEN: ABO/RH(D): O POS

## 2010-09-13 LAB — MAGNESIUM: Magnesium: 1.9 mg/dL (ref 1.5–2.5)

## 2010-09-13 LAB — APTT: aPTT: 29 seconds (ref 24–37)

## 2010-09-13 LAB — FERRITIN: Ferritin: 392 ng/mL — ABNORMAL HIGH (ref 10–291)

## 2010-09-13 LAB — URINE MICROSCOPIC-ADD ON

## 2010-09-13 LAB — LACTIC ACID, PLASMA: Lactic Acid, Venous: 1 mmol/L (ref 0.5–2.2)

## 2010-09-16 LAB — BASIC METABOLIC PANEL
BUN: 13 mg/dL (ref 6–23)
BUN: 9 mg/dL (ref 6–23)
CO2: 20 mEq/L (ref 19–32)
CO2: 25 mEq/L (ref 19–32)
Calcium: 9.1 mg/dL (ref 8.4–10.5)
Calcium: 9.3 mg/dL (ref 8.4–10.5)
Chloride: 112 mEq/L (ref 96–112)
Chloride: 112 mEq/L (ref 96–112)
Creatinine, Ser: 0.71 mg/dL (ref 0.4–1.2)
Creatinine, Ser: 0.85 mg/dL (ref 0.4–1.2)
Creatinine, Ser: 0.96 mg/dL (ref 0.4–1.2)
GFR calc Af Amer: >60 mL/min (ref >60)
GFR calc non Af Amer: >60 mL/min (ref >60)
Glucose, Bld: 123 mg/dL — ABNORMAL HIGH (ref 70–99)
Glucose, Bld: 90 mg/dL (ref 70–99)
Potassium: 4.2 mEq/L (ref 3.5–5.1)

## 2010-09-16 LAB — COMPREHENSIVE METABOLIC PANEL
ALT: 11 U/L (ref 0–35)
ALT: 9 U/L (ref 0–35)
AST: 13 U/L (ref 0–37)
AST: 21 U/L (ref 0–37)
Albumin: 3.9 g/dL (ref 3.5–5.2)
Alkaline Phosphatase: 97 U/L (ref 39–117)
BUN: 13 mg/dL (ref 6–23)
CO2: 29 mEq/L (ref 19–32)
Calcium: 9.7 mg/dL (ref 8.4–10.5)
Chloride: 108 mEq/L (ref 96–112)
GFR calc Af Amer: 55 mL/min — ABNORMAL LOW (ref >60)
GFR calc Af Amer: >60 mL/min (ref >60)
GFR calc non Af Amer: 60 mL/min — ABNORMAL LOW (ref >60)
Potassium: 3.6 mEq/L (ref 3.5–5.1)
Sodium: 141 mEq/L (ref 135–145)
Sodium: 141 mEq/L (ref 135–145)
Total Bilirubin: 1.1 mg/dL (ref 0.3–1.2)
Total Protein: 7.7 g/dL (ref 6.0–8.3)

## 2010-09-16 LAB — CBC
HCT: 36.1 % (ref 36.0–46.0)
HCT: 37.3 % (ref 36.0–46.0)
Hemoglobin: 12.3 g/dL (ref 12.0–15.0)
MCHC: 33.8 g/dL (ref 30.0–36.0)
MCHC: 33.8 g/dL (ref 30.0–36.0)
MCV: 88.4 fL (ref 78.0–100.0)
MCV: 88.8 fL (ref 78.0–100.0)
MCV: 88.6 fL (ref 78.0–100.0)
Platelets: 326 10*3/uL (ref 150–400)
Platelets: 239 10*3/uL (ref 150–400)
Platelets: 374 10*3/uL (ref 150–400)
RBC: 5.01 MIL/uL (ref 3.87–5.11)
RDW: 13.1 % (ref 11.5–15.5)
RDW: 12.7 % (ref 11.5–15.5)
RDW: 12.8 % (ref 11.5–15.5)
WBC: 4.6 10*3/uL (ref 4.0–10.5)
WBC: 7.7 10*3/uL (ref 4.0–10.5)

## 2010-09-16 LAB — CLOSTRIDIUM DIFFICILE EIA
C difficile Toxins A+B, EIA: NEGATIVE
C difficile Toxins A+B, EIA: NEGATIVE

## 2010-09-16 LAB — URINALYSIS, ROUTINE W REFLEX MICROSCOPIC
Bilirubin Urine: NEGATIVE
Glucose, UA: NEGATIVE mg/dL
Glucose, UA: NEGATIVE mg/dL
Protein, ur: 100 mg/dL — AB
Protein, ur: NEGATIVE mg/dL
Specific Gravity, Urine: 1.021 (ref 1.005–1.030)

## 2010-09-16 LAB — LIPID PANEL
HDL: 33 mg/dL — ABNORMAL LOW (ref >39)
LDL Cholesterol: 106 mg/dL — ABNORMAL HIGH (ref 0–99)
Total CHOL/HDL Ratio: 4.8 RATIO
Triglycerides: 100 mg/dL (ref <150)
VLDL: 20 mg/dL (ref 0–40)

## 2010-09-16 LAB — GLUCOSE, CAPILLARY: Glucose-Capillary: 289 mg/dL — ABNORMAL HIGH (ref 70–99)

## 2010-09-16 LAB — URINE CULTURE
Colony Count: NO GROWTH
Special Requests: NEGATIVE

## 2010-09-16 LAB — URINE MICROSCOPIC-ADD ON

## 2010-09-16 LAB — DIFFERENTIAL
Basophils Relative: 0 % (ref 0–1)
Monocytes Absolute: 0.6 10*3/uL (ref 0.1–1.0)
Monocytes Relative: 5 % (ref 3–12)
Neutro Abs: 11.5 10*3/uL — ABNORMAL HIGH (ref 1.7–7.7)

## 2010-09-16 LAB — STOOL CULTURE

## 2010-09-16 LAB — HEMOGLOBIN A1C: Mean Plasma Glucose: 82 mg/dL

## 2010-09-16 LAB — CULTURE, BLOOD (ROUTINE X 2): Culture: NO GROWTH

## 2010-09-16 LAB — OVA AND PARASITE EXAMINATION

## 2010-09-27 ENCOUNTER — Other Ambulatory Visit: Payer: Self-pay | Admitting: Internal Medicine

## 2010-10-01 ENCOUNTER — Other Ambulatory Visit: Payer: Self-pay

## 2010-10-01 ENCOUNTER — Other Ambulatory Visit: Payer: Self-pay | Admitting: Internal Medicine

## 2010-10-01 DIAGNOSIS — K5000 Crohn's disease of small intestine without complications: Secondary | ICD-10-CM

## 2010-10-15 ENCOUNTER — Telehealth: Payer: Self-pay | Admitting: Internal Medicine

## 2010-10-16 NOTE — Telephone Encounter (Signed)
Patient advised that she should be seen in 6-9 months from last office visit 06/2010.

## 2010-10-23 NOTE — Discharge Summary (Signed)
[name], [name]               ACCOUNT NO.:  [id number]   MEDICAL RECORD NO.:  [id number]          PATIENT TYPE:  INP   LOCATION:  [place]                         FACILITY:  [facility]   PHYSICIAN:  Domingo Mend, M.D. DATE OF BIRTH:  [birth year]   DATE OF ADMISSION:  12/20/2008  DATE OF DISCHARGE:  12/26/2008                               DISCHARGE SUMMARY   DISCHARGE DIAGNOSES:  1. Crohn ileitis.  2. Urinary tract infection.  3. Uterine prolapse with cystocele.   DISCHARGE MEDICATIONS:  Include:  1. Prednisone 40 mg daily until further seen by Dr. Carlean Purl.  2. Cipro 250 mg p.o. twice daily until December 28, 2008.   DISPOSITION AND FOLLOW UP:  Mrs. Morikawa will follow up with Dr. Silvano Rusk with [facility] on January 04, 2009, at 10:45 a.m.   CONSULTATIONS THIS HOSPITALIZATION:  1. Dr. Carlean Purl with GI.  2. Dr. Barry Dienes with [facility].   IMAGES AND PROCEDURES PERFORMED DURING THIS HOSPITALIZATION:  Include:  1. An acute abdominal series on December 20, 2008, that showed no acute      abnormality.  2. A CT scan of the abdomen and pelvis on December 20, 2008, that showed      findings consistent with a small bowel obstruction.  Findings      raised the question of previous history of inflammatory bowel      disease.  Findings compatible with chronic atrophic pyelonephritis      of the left with probable chronic dilatation of the left collecting      system and ureter.  3. Patient had a colonoscopy performed on December 23, 2008, with      inflammatory changes in the ileum consistent with Crohn disease.      Biopsies also consistent with Crohn disease.   HISTORY AND PHYSICAL EXAM:  For full details, please see dictation on  December 20, 2008, by Dr. Allyson Sabal but in brief [name] is a pleasant 77-  year-old Caucasian lady with no significant past medical history who  presents to the hospital with abdominal pain.  Upon further questioning,  it appears she has been having this abdominal  pain for several years  that has been intermittent.  She presented to the ER because of  worsening abdominal pain, as well as nausea, vomiting, and diarrhea.  She has been having about 3 or 4 bowel movements a day.  Because of  this, she was admitted to our service for further evaluation and  management.   HOSPITAL COURSE BY ACTIVE PROBLEM:  1. Her chronic abdominal pain with diarrhea.  Gastroenterology was      consulted who performed a colonoscopy.  Findings were consistent      with a probable Crohn ileitis which biopsies have confirmed.  She      was started on IV Solu-Medrol and has been transitioned over to      p.o. prednisone on day of discharge.  She is not tolerating a full      diet.  She continues to have loose stools maybe one to two a day.  Her abdominal pain has improved significantly.  We will proceed to      discharge her home today with followup with gastroenterology.  2. Her chronic left atrophic pyelonephritis and chronic dilatation of      the left collecting system and ureter.  A consultation with Urology      was obtained on December 21, 2008, by Dr. Matilde Sprang.  He believes that      her chronic pyelonephritis is not the cause of her abdominal pain      and that the chronic changes of the left kidney were probably      secondary to long reflux.  No treatment was needed.  He believed      that the correct treatment for this would be to correct her      cystocele.  She has followup with Gynecology for this issue.  3. Urinary tract infection.  Cultures while in the hospital have shown      E. coli that is pansensitive.  She has been on IV Cipro while in      the hospital.  We will transition over to p.o. Cipro.  She has 2      more days to complete a total of 7 days of antibiotics.  She has      been provided with a prescription for this.   VITAL SIGNS ON DAY OF DISCHARGE:  Blood pressure 170/90.  Heart rate 57.  Respirations 20.  O2 saturations 99% on room air  with a temp of 97.9.   LABS ON DAY OF DISCHARGE:  Sodium 137, potassium 4.2, chloride 112,  bicarb 23, BUN 13, creatinine 0.85, glucose of 123.  WBCs 5.0,  hemoglobin 12.6, and a platelet count of 326.      Domingo Mend, M.D.  Electronically Signed     EH/MEDQ  D:  12/26/2008  T:  12/27/2008  Job:  [id number]   cc:   Gatha Mayer, MD,FACG  [address]  [place], [place] [address]   Stark Klein, MD  [place] [address]

## 2010-10-23 NOTE — Consult Note (Signed)
[name], [name]               ACCOUNT NO.:  [id number]   MEDICAL RECORD NO.:  [id number]          PATIENT TYPE:  EMS   LOCATION:  ED                           FACILITY:  [facility]   PHYSICIAN:  Stark Klein, MD       DATE OF BIRTH:  1932-11-30   DATE OF CONSULTATION:  12/20/2008  DATE OF DISCHARGE:                                 CONSULTATION   REQUESTING PHYSICIAN:  Rolland Porter, M.D.   CHIEF COMPLAINT:  Nausea, vomiting, diarrhea.   HISTORY OF PRESENT ILLNESS:  [name] is a 77 year old female who has  had a chronic history of abdominal pain that is crampy in nature.  She  also describes having significant nausea that occurs frequently.  She  has decreased the size of her meals and that has improved matters  somewhat; however, this did not make things necessarily better.  She has  had a history of believing in faith healing.  She has not sought  significant medical care for this problem; however, she has been told  that she has fibromyalgia or gallstones or scar tissue but basically has  not ever had a real workup that I can find or that the patient can  describe.  The diarrhea is more of an acute problem, and she describes  it as watery and small volume.  She has had stool today, flatus  yesterday evening.  Primarily she has had some mild distention in the  past few months, but it is more distended today.  She has decreased her  appetite overall but has not really had a change in her appetite over  the past few days.  She denies history of constipation.  She denies  fevers and chills.  She denies chest pain or shortness or breath.  She  also spends a significant amount of time describing the pelvic pressure  and pain that she has from bladder and uterine prolapse.  She states a  desire to get her bladder suspended.  Additionally she describes her  emesis as being brownish.   PAST MEDICAL HISTORY:  Significant for a fractured hip.  No chronic  illnesses that she is aware of.   PAST SURGICAL HISTORY:  She has had bilateral breast enlargement and  left hemiarthroplasty.   SOCIAL HISTORY:  She is accompanied by her husband and daughter and son.  She occasionally drinks wine.  Does not abuse other substances and  believes in faith healing.   MEDICATIONS:  None.   ALLERGIES:  None.   Family history  No known GI illnesses   REVIEW OF SYSTEMS:  As in the HPI.   PHYSICAL EXAMINATION:  VITAL SIGNS:  Temperature 98.3, pulse 92, blood  pressure 153/98, respiratory rate 18, and 95% sats on room air.  GENERAL:  She is alert and oriented x3, in no acute distress.  HEENT:  Normocephalic, atraumatic.  PSYCH:  Mood and affect are normal.  She has a pleasant demeanor.  NECK:  Supple with no lymphadenopathy.  Trachea is midline.  HEART:  Regular rate and rhythm.  No murmurs.  LUNGS:  Clear to  auscultation bilaterally.  ABDOMEN:  Soft.  She does have some distention and diffuse mild  tenderness throughout.  She has hypoactive bowel sounds.  EXTREMITIES:  Warm and well perfused with no edema.   LABORATORIES:  Creatinine was 1.17, glucose 160.  Bili was slightly  elevated at 1.3.  Calcium is slightly elevated at 10.7.  White count  12.5, hematocrit of 47.8, platelets are normal.  UA shows large amount  of leukocyte esterase, positive nitrite, many bacteria, moderate blood.  A CT scan is consistent with a possible small bowel obstruction.  There  are some dilated fluid- filled loops of small bowel and a nondistended  colon; however, there is no transition point seen.  The right-sided  bowel loops have mucosal edema.  There is a question of prior colitis  with shotty mesenteric adenopathy.  Additionally she has left  hydroureteronephrosis with the whole left ureter dilated.  The left  renal pelvis and ureter are suggested to be chronically inflamed.  There  is patent celiac and SMA access.   ASSESSMENT:  [name] is a 77 year old female with acute-on-chronic   exacerbation of nausea, vomiting, diarrhea, abdominal pain and a urinary  tract infection.  I do not see any decompressed small bowel loops and so  I think that this may be an ileus; however, she does have significant  inflammation, which could be enteritis versus IBD.  These would explain  her chronic problems.  She is, however, 31 and has never had abdominal  surgery, so the possibility of a neoplastic obstruction is still a  concern.  She will be admitted to the medical service, remain NPO.  An  NG-tube will be placed if her nausea persists.  She will also receive IV  fluids, IV antibiotics, which are Cipro and Flagyl versus Zosyn.  This  is to provide coverage for her UTI plus possible enteritis, also a GI  consult.      Stark Klein, MD  Electronically Signed     FB/MEDQ  D:  12/20/2008  T:  12/20/2008  Job:  [id number]

## 2010-10-23 NOTE — H&P (Signed)
[name], [name]               ACCOUNT NO.:  [id number]   MEDICAL RECORD NO.:  [id number]          PATIENT TYPE:  EMS   LOCATION:  ED                           FACILITY:  [facility]   PHYSICIAN:  Reyne Dumas, MD       DATE OF BIRTH:  [birth year]   DATE OF ADMISSION:  12/20/2008  DATE OF DISCHARGE:                              HISTORY & PHYSICAL   PRIMARY CARE PHYSICIAN:  Unassigned.   CHIEF COMPLAINT:  Abdominal pain.   SUBJECTIVE:  This is a 77 year old female with no significant past  medical history who presents to the ER with a chief complaint of  abdominal pain.  The patient states that she has been having abdominal  pain off and on for the past several years.  Today, she presents to the  ER because of recurrent nausea and vomiting and diarrhea.  The patient  has had diarrhea for the last couple of days, about 3 to 4 bowel  movements a day described as brown liquid stools without any blood.  The  patient's diarrhea was  acute in onset.  The patient has had episodic  pain described as diffuse pain, spasmodic in nature that comes and goes.  The patient also complains of history of recurrent urinary tract  infections, which she has been treated multiple times.  In deed, her CT  scan of her abdomen is consistent with chronic gastritis and chronic  atrophic pyelonephritis.  The patient was found to have colitis with  small bowel obstruction in the ER.  The patient is being admitted  because of her diarrhea, colitis, small bowel obstruction.   PAST MEDICAL HISTORY:  She has a history of being in excellent health,  except for rhinoplasty by Dr. Jeanella Flattery x2.  No known medical  problems except for recurrent urinary tract infection.   CURRENT MEDICATIONS:  None.   ALLERGIES:  None.   REVIEW OF SYSTEMS:  As documented in the HPI.   SOCIAL HISTORY:  Denies any use of alcohol or tobacco.   PHYSICAL EXAMINATION:  VITAL SIGNS:  Blood pressure 153/95, pulse 92,  respirations 18,  temperature 98.3.  GENERAL:  The patient appears to be comfortable currently.  In no acute  distress.  HEENT:  Pupils are equal, round, and reactive.  Extraocular movements  intact bilaterally.  Sclerae atraumatic anicteric.  NECK:  Supple without any JVD.  LUNGS:  Clear to auscultation bilaterally without wheeze, rales, or  rhonchi.  CARDIOVASCULAR:  Regular rate and rhythm.  No murmurs, rubs, or gallops.  ABDOMEN:  Diffusely tender to palpation with hypoactive bowel sounds.  No hepatosplenomegaly.  PELVIC:  Suprapubic tenderness.  EXTREMITIES:  Without cyanosis, clubbing, or edema.  NEUROLOGIC:  Cranial nerves 2-12 are intact.   LABORATORY STUDIES:  Urinalysis shows turbid urine with positive nitrite  and large leukocyte esterase.  Lipase is negative.  CMP sodium 141,  potassium 3.6, chloride 102, bicarb 28, glucose 116, BUN 13, creatinine  1.17, T-bili 1.3, alkaline phosphatase 97, AST 21, ALT 11, total protein  7.7, calcium 10.7.  CBC WBC 12.5, hemoglobin 16.1, hematocrit 47.8,  platelet count 374,000.  CT scan of the abdomen and pelvis shows small  bowel obstruction, inflammatory bowel disease with clostridium difficile  colitis.  Also, the patient has chronic atrophic pyelonephritis and  chronic dilatation of the left collecting system.   ASSESSMENT AND PLAN:  1. Possible colitis/ enteritis with small bowel obstruction.  2. Chronic cystitis and chronic atrophic pyelonephritis.  3. Chronic dilatation of the left collecting system and the ureter.   PLAN:  The patient will be seen by surgery, Dr. Stark Klein, general  surgery.  The patient was requested to get an NG tube in place, but at  bedtime refused.  The patient has been instructed to start on IV  antibiotics, Cipro and Flagyl.  However, I am not quite sure if she will  be compliant with the medications.  She believes in faith healing.  The  patient was also told that she would need a colonoscopy at some point.  GI  consultation is definitely indicated and will be done in the morning.  The patient also needs to be seen by urology because of her chronic  cystitis and chronic pyelonephritis.  This can also be done in the  morning as the patient currently hemodynamically stable without any  obstruction or hydronephrosis and renal function is fine.  She is Full  Code.      Reyne Dumas, MD  Electronically Signed     NA/MEDQ  D:  12/20/2008  T:  12/20/2008  Job:  [id number]

## 2010-10-26 NOTE — H&P (Signed)
[name], [name] NO.:  [id number]   MEDICAL RECORD NO.:  [id number]          PATIENT TYPE:  EMS   LOCATION:  ED                           [facility]ACILITY:  [facility]   PHYSICIAN:  Tarri Glenn, M.D.  DATE OF BIRTH:  [birth year]   DATE OF ADMISSION:  06/20/2004  DATE OF DISCHARGE:                                HISTORY & PHYSICAL   CHIEF COMPLAINT:  Pain in my left hip.   HISTORY OF PRESENT ILLNESS:  This 77 year old white female was visiting her  daughter near [place] this past Saturday.  She was descending the back  stairs at her daughter's house, and either stepped on the hem of her coat or  slipped; but fell down the stairs.  She had pain into her left hip at that  time; had difficulty standing due to the pain and discomfort.  She is quite  a stoic lady, and as she was only having moderate pain (in her  interpretation), she chose not to go the emergency room in the [place] area.  Eventually she was seen at [place] today.  Her  daughter is an Engineering geologist there, and she urged her to come for  evaluation.  X-rays there revealed a displaced femoral neck fracture of the  left hip.  It is felt that she needed surgical intervention, and is being  admitted for hemiarthroplasty of the left hip.  We will order new films and  if they correlate with the films from [facility], that is the planned  procedure.   PAST MEDICAL HISTORY:  This lady has been in excellent health throughout her  lifetime.  She has only had rhinoplasty by Dr. Jeanella Flattery x2.  She has no  medical problems.   CURRENT MEDICATIONS:  Calcium carbonate and multivitamin.   ALLERGIES:  NO KNOWN DRUG ALLERGIES.   FAMILY HISTORY:  Noncontributory.   SOCIAL HISTORY:  She has no intake of tobacco or alcohol products.   REVIEW OF SYSTEMS:  CNS:  No seizures, __________, numbness or double  vision.  RESPIRATORY:  No productive cough.  No hemoptysis.  No shortness of  breath.   CARDIOVASCULAR:  No chest pain.  No angina.  No orthopnea.  GASTROINTESTINAL:  No nausea, vomiting, melena or bloody stools.  GENITOURINARY:  No discharge, dysuria or hematuria.  MUSCULOSKELETAL:  Primarily in present illness of the left hip.   PHYSICAL EXAMINATION:  GENERAL:  Alert, cooperative and friendly 77 year old  white female, looking much younger than her stated age.  She is awake and  alert and oriented x3.  VITAL SIGNS:  Blood pressure 139/88, pulse 68, respirations 16, temperature  97.9.  HEENT:  Normocephalic.  PERRLA.  EOM intact.  Oropharynx clear.  CHEST:  Clear to auscultation.  No rhonchi and no rales.  HEART:  Regular rate and rhythm; no murmurs are heard.  ABDOMEN:  Soft and nontender.  Liver or spleen not felt.  GENITALIA, RECTAL, PELVIC, BREAST:  Not done, not pertinent to the present  illness.  EXTREMITIES:  The patient holds the left lower extremity in slight external  rotation.  NEUROVASCULAR:  Intact to the feet and lower extremities.  Note, the left  hip obviously not ranged.   ADMISSION DIAGNOSIS:  Displaced left femoral neck fracture.   PLAN:  The patient will undergo, depending on the x-ray confirmation,  hemiarthroplasty of the left hip.  I have explained the procedure to both  the patient and to her husband Lemar Livings.      DLU/MEDQ  D:  06/20/2004  T:  06/20/2004  Job:  [id number]   cc:   [place]

## 2010-10-26 NOTE — Discharge Summary (Signed)
[name], [name]               ACCOUNT NO.:  [id number]   MEDICAL RECORD NO.:  [id number]          PATIENT TYPE:  INP   LOCATION:  1308                         FACILITY:  [facility]   PHYSICIAN:  Tarri Glenn, M.D.  DATE OF BIRTH:  02/09/1933   DATE OF ADMISSION:  06/20/2004  DATE OF DISCHARGE:  06/25/2004                                 DISCHARGE SUMMARY   ADMISSION DIAGNOSIS:  Displaced femoral neck fracture of the left hip.   DISCHARGE DIAGNOSES:  1.  Displaced femoral neck fracture of the left hip.  2.  Postoperative anemia.   CONSULTS:  None.   OPERATION:  On June 20, 2004 the patient underwent cemented bipolar  hemiarthroplasty of the left hip, D.L. Delorse Lek, P.A.-C. assisted.   BRIEF HISTORY:  A 77 year old lady who fell down some stairs, tripping on  the hem of her coat or missed a step while visiting her daughter in [place]  the past Saturday prior to admission.  She had been getting about her home  using chairs as a walker.  She had a considerable amount of pain and  discomfort and was convinced by her family to be seen at her family  practice.  X-rays there showed a femoral neck fracture.  She was brought to  the emergency room where x-rays confirmed a subcapital femoral neck fracture  of the left hip.  After much consideration, it was decided that the patient  would benefit from surgical intervention and was admitted for the above  procedure.  She is in good health.   HOSPITAL COURSE:  The patient tolerated the surgical procedure quite well.  She is a very stoic lady and only a few analgesics were required.  We were  eventually able to wean her from PCA pump to p.o. analgesics.  She worked  with physical therapy, progressing through ADLs.  The wound remained dry  without evidence of any drainage or infection and neurovascularly she  remained intact to the operated extremity.  She was to go home on June 24, 2004, however, she had a considerable amount of  nausea and vomiting  thought to be secondary to the iron supplement given for her anemia.  Once  this was stopped, she was in good condition to go home.  Her vital signs  were stable and the wound was dry.   LABORATORY DATA IN HOSPITAL:  Hematologically, she had a CBC with  differential with normal hemoglobin preoperatively, otherwise completely  within normal limits.  Final hemoglobin was 10.7, hematocrit 30.7.  Blood  chemistries were all normal.  The primary potassium was 6.1, however, when  repeated it was 4.0.  Urinalysis essentially negative for urinary tract  infection.  Chest x-ray showed no evidence of acute cardiopulmonary disease.  Electrocardiogram showed normal sinus rhythm, normal ECG.   CONDITION ON DISCHARGE:  Improved, stable.   PLAN:  The patient discharged to her home.  She may weightbear as tolerated  on the operated extremity, use a dry dressing p.r.n. and home health will be  provided.  She is to return to see Dr. Shellia Carwin 2 weeks after  the date of  surgery and follow up with her medical physician as indicated.  She will  continue on the Coumadin protocol for 4 weeks after the date of surgery.  She asked for a sleeping pill, we declined to do this and told her she could  use some  Benadryl over-the-counter.  Vicodin was given for discomfort and Robaxin for  a muscle relaxant.  We also will not continue the iron at home and told her  to eat a good diet.  She will use an over-the-counter multivitamin.  I wrote  a prescription for some Phenergan tablets should she have any nausea.  Call  for any problems.       DLU/MEDQ  D:  06/25/2004  T:  06/25/2004  Job:  [id number]   cc:   [place]

## 2010-10-26 NOTE — Op Note (Signed)
NAMESHERIN, [name]               ACCOUNT NO.:  [id number]   MEDICAL RECORD NO.:  [id number]          PATIENT TYPE:  INP   LOCATION:  0101                         [facility]ACILITY[facility]   PHYSICIAN:  Tarri Glenn, M.D.  DATE OF BIRTH:  1933/01/03   DATE OF PROCEDURE:  06/20/2004  DATE OF DISCHARGE:                                 OPERATIVE REPORT   PREOPERATIVE DIAGNOSES:  Closed displaced left femoral neck fracture.   POSTOPERATIVE DIAGNOSES:  Closed displaced left femoral neck fracture.   OPERATION:  Cemented bipolar hemiarthroplasty of the left hip.   SURGEON:  Tarri Glenn, M.D.   ASSISTANT:  Mr. Delorse Lek, P.A.-C.   ANESTHESIA:  General.   PATHOLOGY AND JUSTIFICATION FOR PROCEDURE:  She is a 77 year old with a good  bit of osteoporosis and the fracture was displaced.  It was felt the above  surgery was the treatment of choice.   DESCRIPTION OF PROCEDURE:  Prophylactic antibiotics, satisfactory general  anesthesia, Foley catheter inserted, placed on the OR table using the Mark  II frame with right lateral decubitus position. The left hip was prepped  with DuraPrep, draped in a sterile field, Ioban employed.  A posterolateral  incision down through the fascia lata, Zickel's band was cut. External  rotators were detached from the femur.  The hip capsule was identified,  fracture site was noted, capsule was opened in T fashion and the flaps  tagged w #1 Vicryl.  I removed the femoral head and measured it at 46 mm.  I  then cleared the piriformis fossa of soft tissue and placed a guidepin down  the femoral canal, over drilled it and then tried to use the canal finder  but found bony obstruction and consequently I had to use flexible reamers  until I got to where I could use a 4 vertical reamer in the osteonic set.  I  then used the greater trochanteric reamer and also made my final cut on the  femoral neck slightly less than a fingerbreadth above the lesser trochanter  __________ to the site of the fracture.  I then completed the reaming up to  a size 6 which was the size we had templated.  We then continued with  rasping, this was done very gently because of her thin bone.  After working  up to a #6, I measured for the distal femoral tip and cement plug.  I used a  3 cement plug and 11 mm tip for the femoral component.  I then cleared the  acetabulum of all debris and confirmed the femoral head size with a 46 ball.  I then water picked the femoral canal, dried it and methyl methacrylate was  mixed and inserted with a glue gun and pressurizer technique.  I then placed  a size 6 ODC 127 degree stem impacting it and holding it until the  methacrylate had hardened trimming up excess methacrylate in the process.  We then went through a trial reduction and a plus zero neck length seemed to  be appropriate size and accordingly we went ahead and placed a plus zero C  tapered head followed by the bipolar cup, reduced the hip and it was nice  and stable. The capsule was closed with interrupted #1 Vicryl as well as the  external rotators reattached to the femur. The fascia lata was closed with  interrupted #1 Vicryl, subcutaneous tissue with 2-0 Vicryl and the skin with  staples. Betadine Adaptic dry sterile  dressing were applied. She was gently placed on her PACU bed, knee  immobilizer was applied. She was taken to the recovery room in satisfactory  condition with no known complications. Estimated blood loss was  approximately 250 mL, no blood replacement.      JA/MEDQ  D:  06/20/2004  T:  06/20/2004  Job:  [id number]

## 2010-12-13 ENCOUNTER — Other Ambulatory Visit: Payer: Self-pay | Admitting: Internal Medicine

## 2010-12-13 NOTE — Telephone Encounter (Signed)
Refilled medication #45 with 3 refills but after this patient needs to be seen.

## 2011-02-13 ENCOUNTER — Other Ambulatory Visit: Payer: Self-pay | Admitting: Internal Medicine

## 2011-05-27 ENCOUNTER — Other Ambulatory Visit (INDEPENDENT_AMBULATORY_CARE_PROVIDER_SITE_OTHER): Payer: Self-pay

## 2011-05-27 ENCOUNTER — Ambulatory Visit (INDEPENDENT_AMBULATORY_CARE_PROVIDER_SITE_OTHER): Payer: Self-pay | Admitting: Internal Medicine

## 2011-05-27 ENCOUNTER — Other Ambulatory Visit: Payer: Self-pay | Admitting: Internal Medicine

## 2011-05-27 ENCOUNTER — Encounter: Payer: Self-pay | Admitting: Internal Medicine

## 2011-05-27 VITALS — BP 120/60 | HR 72 | Ht 62.0 in | Wt 130.0 lb

## 2011-05-27 DIAGNOSIS — K5000 Crohn's disease of small intestine without complications: Secondary | ICD-10-CM

## 2011-05-27 DIAGNOSIS — Z79899 Other long term (current) drug therapy: Secondary | ICD-10-CM

## 2011-05-27 DIAGNOSIS — Z23 Encounter for immunization: Secondary | ICD-10-CM

## 2011-05-27 DIAGNOSIS — E210 Primary hyperparathyroidism: Secondary | ICD-10-CM

## 2011-05-27 DIAGNOSIS — Z9119 Patient's noncompliance with other medical treatment and regimen: Secondary | ICD-10-CM

## 2011-05-27 LAB — COMPREHENSIVE METABOLIC PANEL
Albumin: 3.9 g/dL (ref 3.5–5.2)
Alkaline Phosphatase: 92 U/L (ref 39–117)
BUN: 17 mg/dL (ref 6–23)
Calcium: 10.7 mg/dL — ABNORMAL HIGH (ref 8.4–10.5)
Chloride: 109 mEq/L (ref 96–112)
Creatinine, Ser: 1 mg/dL (ref 0.4–1.2)
Glucose, Bld: 86 mg/dL (ref 70–99)
Potassium: 4.3 mEq/L (ref 3.5–5.1)

## 2011-05-27 LAB — CBC WITH DIFFERENTIAL/PLATELET
Eosinophils Relative: 3.7 % (ref 0.0–5.0)
Lymphocytes Relative: 15.6 % (ref 12.0–46.0)
MCV: 96.2 fl (ref 78.0–100.0)
Monocytes Absolute: 0.4 10*3/uL (ref 0.1–1.0)
Neutrophils Relative %: 70.1 % (ref 43.0–77.0)
Platelets: 204 10*3/uL (ref 150.0–400.0)
RBC: 4.33 Mil/uL (ref 3.87–5.11)
WBC: 4 10*3/uL — ABNORMAL LOW (ref 4.5–10.5)

## 2011-05-27 LAB — HEPATIC FUNCTION PANEL
ALT: 29 U/L (ref 0–35)
AST: 21 U/L (ref 0–37)
Albumin: 3.9 g/dL (ref 3.5–5.2)
Alkaline Phosphatase: 92 U/L (ref 39–117)
Bilirubin, Direct: 0.1 mg/dL (ref 0.0–0.3)
Total Bilirubin: 1 mg/dL (ref 0.3–1.2)
Total Protein: 6.9 g/dL (ref 6.0–8.3)

## 2011-05-27 MED ORDER — MERCAPTOPURINE 50 MG PO TABS: ORAL_TABLET | ORAL | Status: DC

## 2011-05-27 NOTE — Progress Notes (Signed)
Quick Note:  Silvano Rusk, MD 05/27/2011 2:46 PM Signed  Subjective:     Patient ID: [name], female DOB: Dec 03, 1932, 77 y.o. MRN: [id number]   HPI this 77 year old white woman is known to me for Crohn's ileitis. I have not seen her for almost a year. Ran out of 6MP in past few months - "I really do not know when I ran out. We have had money problems." She has had some episodic diarrhea but not for more than a day at a time. She denies any bleeding. She says she can't really do think she used to in the past, due to fatigue. She is not describing any nausea vomiting, abdominal pain or leading from the rectum. No fevers or chills reported. She has not yet had a flu vaccination this year.   No Known Allergies Outpatient Prescriptions Prior to Visit  Medication Sig Dispense Refill  . aspirin 81 MG tablet Take 81 mg by mouth daily.  . Cholecalciferol (VITAMIN D3) 1000 UNITS CAPS Take 1 capsule by mouth daily.  . Multiple Vitamin (MULTIVITAMIN) tablet Take 1 tablet by mouth daily.  . mercaptopurine (PURINETHOL) 50 MG tablet TAKE 1 1/2 TABLET BY MOUTH ONCE A DAY 45 tablet 3  . alendronate (FOSAMAX) 70 MG tablet Take 70 mg by mouth every 7 (seven) days. Take with a full glass of water on an empty stomach.   Past Medical History  Diagnosis Date  . Crohn's disease  . Bladder prolapse, congenital   pessary in  . Osteoporosis  . Pubic ramus fracture  . Hyperparathyroidism  . Vitamin D deficiency  . History of hypercalcemia  . History of hyperglycemia  . Hydronephrosis   Past Surgical History  Procedure Date  . Hip fracture surgery   left, prosthesis  . Rhinoplasty  . Tonsillectomy  . Laparoscopy 10/2009  . Colonoscopy 12/23/2008   crohn's, diverticulosis, internal hemorrhoids   History    Social History  . Marital Status: Married   Spouse Name: N/A   Number of Children: 3  . Years of Education: N/A    Occupational History  . retired    Social History Main Topics   . Smoking status: None  . Smokeless tobacco: Never Used  . Alcohol Use: No   wine  . Drug Use: No  . Sexually Active: None    Other Topics Concern  . None    Social History Narrative  . None   Family History  Problem Relation Age of Onset  . Heart disease Father  . Osteoporosis Mother          Review of Systems Still suffers with bladder problems - Pesary is used - seeing Dr. Quincy Simmonds - it was removed last week and was given a tube of estrogen and is to see Dr. Hulan Fray (Dr. Quincy Simmonds is moving to [place])  Objective:   Physical Exam General: NAD Eyes: anicteric Lungs: clear Heart: S1S2 no rubs, murmurs or gallops Abdomen: soft and nontender, BS+ mild fullness in RLQ Ext: no edema        Let her know that of the mildly elevated calcium her labs look okay. I have refilled her 6 MP. We need to order a CBC and hepatic function panel under the long-term use of medications diagnosis, she should have that done in 3 months. She will need to keep up with lab testing and/or to continue to have her medications prescribed.  Her calcium is elevated again, it does not appear dangerous but it could  go higher. She's taking any calcium supplements that should stop, and she should followup with her primary care physician in the next one to 2 months at the latest.          Best phone # [phone] ______

## 2011-05-27 NOTE — Progress Notes (Signed)
Quick Note:  Let her know other than mildly elevated calcium her labs look okay. I have refilled her 6 MP. We need to order a CBC and hepatic function panel under the long-term use of medications diagnosis, she should have that done in 3 months. She will need to keep up with lab testing and/or to continue to have her medications prescribed.  Her calcium is elevated again, it does not appear dangerous but it could go higher. She's taking any calcium supplements that should stop, and she should followup with her primary care physician in the next one to 2 months at the latest.  Best phone # [phone]   ______

## 2011-05-27 NOTE — Assessment & Plan Note (Signed)
Calcium is slightly elevated again today. Labs returned after she left. We will advise her to return to her primary care physician.

## 2011-05-27 NOTE — Patient Instructions (Signed)
Please go to the basement upon leaving today to have your labs done. You have been given a Flu shot today. Once your labs have been reviewed we will prescribe your medication and a follow up will be scheduled after review of labs.

## 2011-05-27 NOTE — Progress Notes (Signed)
  Subjective:    Patient ID: [name], female    DOB: Oct 31, 1932, 77 y.o.   MRN: [id number]  HPI this 77 year old white woman is known to me for Crohn's ileitis. I have not seen her for almost a year. Ran out of 6MP in past few months - "I really do not know when I ran out. We have had money problems." She has had some episodic diarrhea but not for more than a day at a time. She denies any bleeding. She says she can't really do think she used to in the past, due to fatigue. She is not describing any nausea vomiting, abdominal pain or leading from the rectum. No fevers or chills reported. She has not yet had a flu vaccination this year.  No Known Allergies Outpatient Prescriptions Prior to Visit  Medication Sig Dispense Refill  . aspirin 81 MG tablet Take 81 mg by mouth daily.        . Cholecalciferol (VITAMIN D3) 1000 UNITS CAPS Take 1 capsule by mouth daily.        . Multiple Vitamin (MULTIVITAMIN) tablet Take 1 tablet by mouth daily.        . mercaptopurine (PURINETHOL) 50 MG tablet TAKE 1 1/2 TABLET BY MOUTH ONCE A DAY  45 tablet  3  . alendronate (FOSAMAX) 70 MG tablet Take 70 mg by mouth every 7 (seven) days. Take with a full glass of water on an empty stomach.        Past Medical History  Diagnosis Date  . Crohn's disease   . Bladder prolapse, congenital     pessary in  . Osteoporosis   . Pubic ramus fracture   . Hyperparathyroidism   . Vitamin D deficiency   . History of hypercalcemia   . History of hyperglycemia   . Hydronephrosis    Past Surgical History  Procedure Date  . Hip fracture surgery     left, prosthesis  . Rhinoplasty   . Tonsillectomy   . Laparoscopy 10/2009  . Colonoscopy 12/23/2008    crohn's, diverticulosis, internal hemorrhoids   History   Social History  . Marital Status: Married    Spouse Name: N/A    Number of Children: 3  . Years of Education: N/A   Occupational History  . retired    Social History Main Topics  . Smoking status:  None  . Smokeless tobacco: Never Used  . Alcohol Use: No     wine  . Drug Use: No  . Sexually Active: None   Other Topics Concern  . None   Social History Narrative  . None   Family History  Problem Relation Age of Onset  . Heart disease Father   . Osteoporosis Mother         Review of Systems Still suffers with bladder problems - Pesary is used - seeing Dr. Quincy Simmonds - it was removed last week and was given a tube of estrogen and is to see Dr. Hulan Fray (Dr. Quincy Simmonds is moving to [place])    Objective:   Physical Exam General:  NAD Eyes:   anicteric Lungs:  clear Heart:  S1S2 no rubs, murmurs or gallops Abdomen:  soft and nontender, BS+ mild fullness in RLQ Ext:   no edema           Assessment & Plan:  Best phone # [phone]

## 2011-05-27 NOTE — Assessment & Plan Note (Signed)
She said she could not afford her 6-MP for a while she been off it for several months. She has not Followup primary care. Some of this is due to lack of Medicare. I encouraged her look into obtaining health care insurance for outpatient coverage. She has been working on this at least some with her son.

## 2011-05-27 NOTE — Assessment & Plan Note (Signed)
She has had some episodic diarrhea but not for more than a day at a time. Overall it seems like she is okay but she needs to get back on her maintenance medication. Once I now her CBC and liver function tests are okay we will refill that and plan to follow her up in 3 months with repeat labs and an office visit in 6 months.

## 2011-05-29 ENCOUNTER — Telehealth: Payer: Self-pay | Admitting: Internal Medicine

## 2011-05-29 ENCOUNTER — Other Ambulatory Visit: Payer: Self-pay | Admitting: Gastroenterology

## 2011-05-29 DIAGNOSIS — Z79899 Other long term (current) drug therapy: Secondary | ICD-10-CM

## 2011-05-29 NOTE — Telephone Encounter (Signed)
Done

## 2011-06-19 ENCOUNTER — Encounter: Payer: Self-pay | Admitting: Obstetrics & Gynecology

## 2011-06-19 ENCOUNTER — Ambulatory Visit (INDEPENDENT_AMBULATORY_CARE_PROVIDER_SITE_OTHER): Payer: Self-pay | Admitting: Obstetrics & Gynecology

## 2011-06-19 VITALS — BP 147/83 | HR 71 | Temp 97.4°F | Resp 16 | Ht 61.0 in | Wt 128.1 lb

## 2011-06-19 DIAGNOSIS — N814 Uterovaginal prolapse, unspecified: Secondary | ICD-10-CM

## 2011-06-19 MED ORDER — ESTRADIOL 2 MG VA RING: 2.0000 mg | VAGINAL_RING | VAGINAL | Status: DC

## 2011-06-19 NOTE — Progress Notes (Signed)
  Subjective:    Patient ID: [name], female    DOB: [birth year], 78 y.o.   MRN: [id number]  HPI  [name] comes as a referral from Dr. Quincy Simmonds for management of complete procedentia. She has been using a Gelhorn pessary for several years but had to quit using it about a month ago because Dr. Quincy Simmonds discovered vaginal ulcerations on exam. She has been using her estrace cream very irregularly. She would like to have sex with her husband but has been unable to due to the [employer].   Review of Systems     Objective:   Physical Exam  Complete procedentia with skin changes associated with prolapse. Bimanual exam reveals no masses.      Assessment & Plan:  #5 ring pessary fitted. No problems.

## 2011-07-17 ENCOUNTER — Encounter: Payer: Self-pay | Admitting: Obstetrics & Gynecology

## 2011-07-17 ENCOUNTER — Ambulatory Visit: Payer: Self-pay | Admitting: Physician Assistant

## 2011-07-19 ENCOUNTER — Ambulatory Visit: Payer: Self-pay | Admitting: Obstetrics & Gynecology

## 2011-08-09 ENCOUNTER — Ambulatory Visit (INDEPENDENT_AMBULATORY_CARE_PROVIDER_SITE_OTHER): Payer: Self-pay | Admitting: Obstetrics & Gynecology

## 2011-08-09 ENCOUNTER — Encounter: Payer: Self-pay | Admitting: Obstetrics & Gynecology

## 2011-08-09 DIAGNOSIS — N393 Stress incontinence (female) (male): Secondary | ICD-10-CM | POA: Diagnosis not present

## 2011-08-09 DIAGNOSIS — N814 Uterovaginal prolapse, unspecified: Secondary | ICD-10-CM

## 2011-08-09 LAB — POCT URINALYSIS DIP (DEVICE)
Bilirubin Urine: NEGATIVE
Glucose, UA: NEGATIVE mg/dL
Ketones, ur: NEGATIVE mg/dL
Leukocytes, UA: NEGATIVE
Nitrite: NEGATIVE

## 2011-08-09 MED ORDER — ESTRADIOL 10 MCG VA TABS: ORAL_TABLET | VAGINAL | Status: DC

## 2011-08-09 NOTE — Progress Notes (Signed)
  Subjective:    Patient ID: [name], female    DOB: May 31, 1933, 78 y.o.   MRN: [id number]  HPI [name] is a 78 yo MW lady who is here for follow up of her pessary and for the complaint of urinary incontinence. She has not had sex yet because her husband refuses to have sex with "someone who wets the bed". She says that her incontinence is somewhat better since using the pessary, but it still happens. She has not used any vaginal estrogen since her last visit her 6 weeks ago (as prescribed) nor has she removed the pessary since then (as directed). She says that she can't physically put it back in so she never took it out.   Review of Systems     Objective:   Physical Exam Entirely normal atrophic mucosa, no erosions, pessary cleaned       Assessment & Plan:  Prolapse and incontinece- I have prescribed Vagifem and stressed its importance. I will check a UA and a UC&S today. She will come back in a month for a pessary cleaning. If her incontinence doesn't improve with vaginal estrogen, I will refer her to a urologist.

## 2011-08-09 NOTE — Progress Notes (Signed)
Addended by: Valarie Merino on: 08/09/2011 12:24 PM   Modules accepted: Orders

## 2011-08-12 LAB — URINE CULTURE: Colony Count: >=100000

## 2011-09-09 ENCOUNTER — Ambulatory Visit (INDEPENDENT_AMBULATORY_CARE_PROVIDER_SITE_OTHER): Payer: Self-pay | Admitting: Physician Assistant

## 2011-09-09 ENCOUNTER — Encounter: Payer: Self-pay | Admitting: Physician Assistant

## 2011-09-09 VITALS — BP 134/84 | HR 71 | Temp 97.1°F | Ht 61.0 in | Wt 128.1 lb

## 2011-09-09 DIAGNOSIS — Z4689 Encounter for fitting and adjustment of other specified devices: Secondary | ICD-10-CM

## 2011-09-09 DIAGNOSIS — R32 Unspecified urinary incontinence: Secondary | ICD-10-CM | POA: Insufficient documentation

## 2011-09-09 DIAGNOSIS — N390 Urinary tract infection, site not specified: Secondary | ICD-10-CM

## 2011-09-09 MED ORDER — SULFAMETHOXAZOLE-TMP DS 800-160 MG PO TABS: 1.0000 | ORAL_TABLET | Freq: Two times a day (BID) | ORAL | Status: DC

## 2011-09-09 MED ORDER — NITROFURANTOIN MONOHYD MACRO 100 MG PO CAPS: 100.0000 mg | ORAL_CAPSULE | Freq: Two times a day (BID) | ORAL | Status: AC

## 2011-09-09 NOTE — Progress Notes (Signed)
Chief Complaint:  Follow-up   [name] is  78 y.o. [phone].  No LMP recorded. Patient is postmenopausal.. She presents complaining of Follow-up  Pt presents for pessary cleaning. Reports she has not been using Vagifem or followed up with urology as encouraged by Dr. Hulan Fray at last visit. Report incontinence improved with ring pessary. Reports that she is not sexually active. Report increased life stress related to recent death of dog and long term house guests.   No complaints today.  Obstetrical/Gynecological History: OB History    Grav Para Term Preterm Abortions TAB SAB Ect Mult Living   3 3 3       3       Past Medical History: Past Medical History  Diagnosis Date  . Crohn's disease   . Bladder prolapse, congenital     pessary in  . Osteoporosis   . Pubic ramus fracture   . Hyperparathyroidism   . Vitamin d deficiency   . History of hypercalcemia   . History of hyperglycemia   . Hydronephrosis   . Urinary incontinence 09/09/2011    Past Surgical History: Past Surgical History  Procedure Date  . Hip fracture surgery     left, prosthesis  . Rhinoplasty   . Tonsillectomy   . Laparoscopy 10/2009  . Colonoscopy 12/23/2008    crohn's, diverticulosis, internal hemorrhoids    Family History: Family History  Problem Relation Age of Onset  . Heart disease Father   . Cancer Father     colon  . Osteoporosis Mother   . Cancer Mother     breast    Social History: History  Substance Use Topics  . Smoking status: Never Smoker   . Smokeless tobacco: Never Used  . Alcohol Use: No     wine    Allergies: No Known Allergies   (Not in a hospital admission)  Review of Systems - Negative except what has been reviewed in the HPI  Physical Exam   Blood pressure 134/84, pulse 71, temperature 97.1 F (36.2 C), temperature source Oral, height 5' 1"  (1.549 m), weight 128 lb 1.6 oz (58.106 kg).  General: General appearance - alert, well appearing, and in no distress  and oriented to person, place, and time Mental status - alert, oriented to person, place, and time, normal mood, behavior, speech, dress, motor activity, and thought processes, affect appropriate to mood Abdomen - soft, nontender, nondistended, no masses or organomegaly Focused Gynecological Exam: Ring pessary removed and cleansed in normal fashion. No evidence of ulcerations or disruption of vaginal mucosa  Labs: 08/09/2011: Urine Culture >100,000 Ecoli  Assessment: 1. Urinary incontinence    2. UTI (lower urinary tract infection)  nitrofurantoin, macrocrystal-monohydrate, (MACROBID) 100 MG capsule, DISCONTINUED: sulfamethoxazole-trimethoprim (BACTRIM DS) 800-160 MG per tablet     Plan: Rx sent to pharmacy for UTI Encouraged use of Vagifem and urology consult as previously directed by Dr. Hulan Fray RTC in 1 month for pessary cleaning or PRN probs  Xavius Spadafore E. 09/09/2011,4:36 PM

## 2011-09-09 NOTE — Patient Instructions (Signed)
Urinary Incontinence Your doctor wants you to have this information about urinary incontinence. This is the inability to keep urine in your body until you decide to release it. CAUSES  Prostate gland enlargement is a common cause of urinary incontinence. But there are many different causes for losing urinary control. They include:  Medicines.   Infections.   Prostate problems.   Surgery.   Neurological diseases.   Emotional factors.  DIAGNOSIS  Evaluating the cause of incontinence is important in choosing the best treatment. This may require:  An ultrasound exam.   Kidney and bladder X-rays.   Cystoscopy. This is an exam of the bladder using a narrow scope.  TREATMENT  For incontinent patients, normal daily hygiene and using changing pads or adult diapers regularly will prevent offensive odors and skin damage from the moisture. Changing your medicines may help control incontinence. Your caregiver may prescribe some medicines to help you regain control. Avoid caffeine. It can over-stimulate the bladder. Use the bathroom regularly. Try about every 2 to 3 hours even if you do not feel the need. Take time to empty your bladder completely. After urinating, wait a minute. Then try to urinate again. External devices used to catch urine or an indwelling urine catheter (Foley catheter) may be needed as well. Some prostate gland problems require surgery to correct. Call your caregiver for more information. Document Released: 07/04/2004 Document Revised: 05/16/2011 Document Reviewed: 06/29/2008 [facility] Patient Information 2012 [place], [place].

## 2011-10-09 ENCOUNTER — Ambulatory Visit (INDEPENDENT_AMBULATORY_CARE_PROVIDER_SITE_OTHER): Payer: Self-pay | Admitting: Physician Assistant

## 2011-10-09 ENCOUNTER — Encounter: Payer: Self-pay | Admitting: Physician Assistant

## 2011-10-09 VITALS — BP 134/70 | HR 65 | Temp 98.3°F | Ht 61.0 in | Wt 128.1 lb

## 2011-10-09 DIAGNOSIS — IMO0002 Reserved for concepts with insufficient information to code with codable children: Secondary | ICD-10-CM

## 2011-10-09 DIAGNOSIS — N8111 Cystocele, midline: Secondary | ICD-10-CM

## 2011-10-09 NOTE — Progress Notes (Signed)
Chief Complaint: Follow-up  [name] is 78 y.o. [phone]. No LMP recorded. Patient is postmenopausal.. She presents complaining of Follow-up    Pt presents for pessary cleaning. Reports she has not been using Vagifem or followed up with urology as encouraged.  Report incontinence improved with ring pessary. Reports that she is not sexually active.  No complaints today.   Obstetrical/Gynecological History:  OB History    Grav  Para  Term  Preterm  Abortions  TAB  SAB  Ect  Mult  Living    3  3  3        3      Past Medical History:  Past Medical History   Diagnosis  Date   .  Crohn's disease    .  Bladder prolapse, congenital      pessary in   .  Osteoporosis    .  Pubic ramus fracture    .  Hyperparathyroidism    .  Vitamin d deficiency    .  History of hypercalcemia    .  History of hyperglycemia    .  Hydronephrosis    .  Urinary incontinence  09/09/2011   Past Surgical History:  Past Surgical History   Procedure  Date   .  Hip fracture surgery      left, prosthesis   .  Rhinoplasty    .  Tonsillectomy    .  Laparoscopy  10/2009   .  Colonoscopy  12/23/2008     crohn's, diverticulosis, internal hemorrhoids   Family History:  Family History   Problem  Relation  Age of Onset   .  Heart disease  Father    .  Cancer  Father       colon    .  Osteoporosis  Mother    .  Cancer  Mother       breast   Social History:  History   Substance Use Topics   .  Smoking status:  Never Smoker   .  Smokeless tobacco:  Never Used   .  Alcohol Use:  No      wine   Allergies: No Known Allergies    Review of Systems - Negative except what has been reviewed in the HPI  Physical Exam   Blood pressure 134/84, pulse 71, temperature 97.1 F (36.2 C), temperature source Oral, height 5' 1"  (1.549 m), weight 128 lb 1.6 oz (58.106 kg).  General: General appearance - alert, well appearing, and in no distress and oriented to person, place, and time  Mental status - alert, oriented to  person, place, and time, normal mood, behavior, speech, dress, motor activity, and thought processes, affect appropriate to mood  Abdomen - soft, nontender, nondistended, no masses or organomegaly  Focused Gynecological Exam: Ring pessary removed and cleansed in normal fashion. No evidence of ulcerations or disruption of vaginal mucosa     Assessment:  1.  Urinary incontinence    Plan:   Encouraged use of Vagifem as directed RTC in 1 month for pessary cleaning or PRN probs    Spenser Harren E.  10/09/2011 3:33 PM

## 2011-11-12 ENCOUNTER — Other Ambulatory Visit: Payer: Self-pay | Admitting: Internal Medicine

## 2011-11-12 ENCOUNTER — Other Ambulatory Visit: Payer: Self-pay

## 2011-11-12 DIAGNOSIS — K5090 Crohn's disease, unspecified, without complications: Secondary | ICD-10-CM

## 2011-11-12 NOTE — Telephone Encounter (Signed)
Pt informed that per Dr. Carlean Purl she needs labs done, CBC, CMET, and an office visit.  Orders put in computer, she hopes to come tomorrow and have labs drawn and she will set up an appt. To see Dr. Carlean Purl at that time.  We refilled her 6MP one month's worth per Dr. Carlean Purl.

## 2011-11-13 ENCOUNTER — Ambulatory Visit (INDEPENDENT_AMBULATORY_CARE_PROVIDER_SITE_OTHER): Payer: Self-pay | Admitting: Obstetrics & Gynecology

## 2011-11-13 VITALS — BP 136/85 | HR 68 | Temp 97.6°F | Ht 61.0 in | Wt 125.7 lb

## 2011-11-13 DIAGNOSIS — Z1231 Encounter for screening mammogram for malignant neoplasm of breast: Secondary | ICD-10-CM | POA: Diagnosis not present

## 2011-11-13 DIAGNOSIS — N813 Complete uterovaginal prolapse: Secondary | ICD-10-CM

## 2011-11-13 DIAGNOSIS — Z0000 Encounter for general adult medical examination without abnormal findings: Secondary | ICD-10-CM

## 2011-11-13 DIAGNOSIS — Z01419 Encounter for gynecological examination (general) (routine) without abnormal findings: Secondary | ICD-10-CM

## 2011-11-13 NOTE — Progress Notes (Signed)
  Subjective:    Patient ID: [name], female    DOB: 1933-01-04, 78 y.o.   MRN: [id number]  HPI  [name] is a lovely  MW 78 yo lady with complete procedentia and incontinence. She comes here monthly for pessary cleaning since she cannot remove it herself.  She has no complaints except life stressors. She does not use the vagifem as she "falls into bed every evening and forgets". She is not interested in a referral to the urologist at the present time.  Review of Systems     Objective:   Physical Exam  Happily, there are no excoriations of her mucose      Assessment & Plan:  Prolapse. She wishes to continue with the pessary.  RTC 1 month. I will schedule a mammogram for her.

## 2011-11-15 ENCOUNTER — Other Ambulatory Visit (INDEPENDENT_AMBULATORY_CARE_PROVIDER_SITE_OTHER): Payer: Medicare Other

## 2011-11-15 DIAGNOSIS — K5090 Crohn's disease, unspecified, without complications: Secondary | ICD-10-CM

## 2011-11-15 LAB — COMPREHENSIVE METABOLIC PANEL
ALT: 35 U/L (ref 0–35)
CO2: 29 mEq/L (ref 19–32)
Chloride: 108 mEq/L (ref 96–112)
GFR: 95.77 mL/min (ref >60.00)
Potassium: 3.9 mEq/L (ref 3.5–5.1)
Sodium: 139 mEq/L (ref 135–145)
Total Bilirubin: 1.6 mg/dL — ABNORMAL HIGH (ref 0.3–1.2)
Total Protein: 7 g/dL (ref 6.0–8.3)

## 2011-11-15 LAB — CBC WITH DIFFERENTIAL/PLATELET
Basophils Absolute: 0 10*3/uL (ref 0.0–0.1)
Lymphocytes Relative: 15.3 % (ref 12.0–46.0)
Monocytes Relative: 9.9 % (ref 3.0–12.0)
Neutrophils Relative %: 70.9 % (ref 43.0–77.0)
Platelets: 229 10*3/uL (ref 150.0–400.0)
RDW: 15.6 % — ABNORMAL HIGH (ref 11.5–14.6)

## 2011-11-18 NOTE — Progress Notes (Signed)
Quick Note:  WBC is low - not a problem at this level but needs to be rechecked end of this week or early next week ______

## 2011-11-19 ENCOUNTER — Other Ambulatory Visit: Payer: Self-pay

## 2011-11-19 DIAGNOSIS — K5090 Crohn's disease, unspecified, without complications: Secondary | ICD-10-CM

## 2011-11-25 ENCOUNTER — Other Ambulatory Visit (INDEPENDENT_AMBULATORY_CARE_PROVIDER_SITE_OTHER): Payer: Self-pay

## 2011-11-25 DIAGNOSIS — K5090 Crohn's disease, unspecified, without complications: Secondary | ICD-10-CM | POA: Diagnosis not present

## 2011-11-25 LAB — CBC WITH DIFFERENTIAL/PLATELET
Basophils Absolute: 0 10*3/uL (ref 0.0–0.1)
HCT: 34 % — ABNORMAL LOW (ref 36.0–46.0)
Lymphs Abs: 0.4 10*3/uL — ABNORMAL LOW (ref 0.7–4.0)
Monocytes Absolute: 0.2 10*3/uL (ref 0.1–1.0)
Monocytes Relative: 8.2 % (ref 3.0–12.0)
Platelets: 229 10*3/uL (ref 150.0–400.0)
RDW: 15.2 % — ABNORMAL HIGH (ref 11.5–14.6)

## 2011-11-25 NOTE — Progress Notes (Signed)
Quick Note:  CBC stable - ok Will see her at follow-up No changes now ______

## 2011-12-04 ENCOUNTER — Other Ambulatory Visit: Payer: Self-pay | Admitting: Obstetrics & Gynecology

## 2011-12-06 ENCOUNTER — Ambulatory Visit (HOSPITAL_COMMUNITY)
Admission: RE | Admit: 2011-12-06 | Discharge: 2011-12-06 | Disposition: A | Discharge status: Home / Self Care | Payer: Medicare Other | Source: Ambulatory Visit | Attending: Obstetrics & Gynecology | Admitting: Obstetrics & Gynecology

## 2011-12-06 ENCOUNTER — Ambulatory Visit (HOSPITAL_COMMUNITY): Admission: RE | Admit: 2011-12-06 | Payer: Self-pay | Source: Ambulatory Visit

## 2011-12-06 DIAGNOSIS — Z1231 Encounter for screening mammogram for malignant neoplasm of breast: Secondary | ICD-10-CM

## 2011-12-16 ENCOUNTER — Ambulatory Visit: Payer: Self-pay | Admitting: Obstetrics & Gynecology

## 2011-12-16 ENCOUNTER — Other Ambulatory Visit: Payer: Self-pay | Admitting: Internal Medicine

## 2011-12-16 ENCOUNTER — Ambulatory Visit (INDEPENDENT_AMBULATORY_CARE_PROVIDER_SITE_OTHER): Payer: Medicare Other | Admitting: Internal Medicine

## 2011-12-16 ENCOUNTER — Encounter: Payer: Self-pay | Admitting: Internal Medicine

## 2011-12-16 ENCOUNTER — Other Ambulatory Visit: Payer: Self-pay

## 2011-12-16 VITALS — BP 110/60 | HR 70 | Ht 61.0 in | Wt 126.0 lb

## 2011-12-16 DIAGNOSIS — Z79899 Other long term (current) drug therapy: Secondary | ICD-10-CM

## 2011-12-16 DIAGNOSIS — K5000 Crohn's disease of small intestine without complications: Secondary | ICD-10-CM

## 2011-12-16 MED ORDER — MERCAPTOPURINE 50 MG PO TABS: 50.0000 mg | ORAL_TABLET | Freq: Every day | ORAL | Status: DC

## 2011-12-16 NOTE — Patient Instructions (Addendum)
Orders have been put in for your labs to be done middle of September, try and come 02/26/2012.  Our lab is open 7:30am- 5:00pm , no appointment needed.  Make an appointment to see Dr. Gwendolyn Grant per Dr. Silvano Rusk.  Please get a flu shot in September 2013.    Come back and see [place] in six months.

## 2011-12-16 NOTE — Telephone Encounter (Signed)
Per Dr. Carlean Purl ok to refill pt's rx for 57mp for 4 months, she called pharmacy when she left today and found out it needed refilled.

## 2011-12-16 NOTE — Assessment & Plan Note (Addendum)
Mild leukopenia with white blood cell count 2.7 and 2.9 , as noted but should not alter therapy. Advised her to get a flu vaccine this fall. She noted that she had 2 days of feeling poorly after the last was vaccine, I told her that sounded nonspecific and I don't think that that means she should not take the influenza vaccine. I believe she has had pneumonia vaccine a lot the look for that result. It is not in the current database. She should have had it when she was hospitalized in last few years.  Even though she's older, since she is immunosuppressed she should probably have annual Pap smear as I believe and I will communicate this to Dr. Hulan Fray.

## 2011-12-16 NOTE — Assessment & Plan Note (Signed)
Note clinically active problems according to the patient and on physical exam. She seems to be doing well on this dose of 6-mercaptopurine. We'll continue that, see her back in 6-9 months. Repeat lab testing in September.

## 2011-12-16 NOTE — Progress Notes (Signed)
  Subjective:    Patient ID: [name], female    DOB: 07-Jan-1933, 78 y.o.   MRN: [id number]  HPI The patient returns for followup of Crohn's ileitis, she is on 6 mercaptopurine therapy. Last labs in June showed mild leukopenia which was confirmed to be stable with CBC week apart. She reports no problems with significant abdominal pain or diarrhea or rectal bleeding or hematochezia. Sometimes when she eats spicy or fried food she gets some lower abdominal cramping but it's transient and is directly related to food. She continues to have pessary changes once a month, going to the clinic to cleanse the pessary. She continues to have situational stressors with the adult roommates in her home, though the couple is moving out so will just be one other unrelated person living in the house.   Review of Systems As above, also 2-3 times a year she get a minute or 2 of chest pain that she relates distress, intubate 8 and has been happening for 3 or 4 years and has never been worse or longer than that. More recently she had an episode within the last week but again it's very rare and unchanged.    Objective:   Physical Exam General:  NAD Eyes:   anicteric Lungs:  clear Heart:  S1S2 no rubs, murmurs or gallops Abdomen:  soft and nontender, BS+ Ext:   no edema    Data Reviewed: Lab Results  Component Value Date   WBC 2.9* 11/25/2011   HGB 11.6* 11/25/2011   HCT 34.0* 11/25/2011   MCV 105.3* 11/25/2011   PLT 229.0 11/25/2011     Chemistry      Component Value Date/Time   NA 139 11/15/2011 1324   K 3.9 11/15/2011 1324   CL 108 11/15/2011 1324   CO2 29 11/15/2011 1324   BUN 15 11/15/2011 1324   CREATININE 0.6 11/15/2011 1324      Component Value Date/Time   CALCIUM 10.6* 11/15/2011 1324   CALCIUM 11.4* 10/02/2009 2302   ALKPHOS 91 11/15/2011 1324   AST 24 11/15/2011 1324   ALT 35 11/15/2011 1324   BILITOT 1.6* 11/15/2011 1324        Assessment & Plan:   1. Crohn's disease of small intestine   2.  Long-term use of immunosuppressant medication

## 2011-12-19 ENCOUNTER — Ambulatory Visit: Payer: Self-pay | Admitting: Physician Assistant

## 2011-12-30 ENCOUNTER — Ambulatory Visit: Payer: Self-pay | Admitting: Obstetrics & Gynecology

## 2012-01-10 ENCOUNTER — Encounter: Payer: Self-pay | Admitting: Obstetrics & Gynecology

## 2012-01-10 ENCOUNTER — Ambulatory Visit (INDEPENDENT_AMBULATORY_CARE_PROVIDER_SITE_OTHER): Payer: Medicare Other | Admitting: Obstetrics & Gynecology

## 2012-01-10 VITALS — BP 148/78 | HR 61 | Temp 97.7°F | Ht 61.0 in | Wt 123.4 lb

## 2012-01-10 DIAGNOSIS — N814 Uterovaginal prolapse, unspecified: Secondary | ICD-10-CM

## 2012-01-10 NOTE — Progress Notes (Signed)
  Subjective:    Patient ID: [name], female    DOB: 21-Jan-1933, 78 y.o.   MRN: [id number]  HPI  Mrs. Alfieri is a 78 yo MW lady with 3rd uterine prolapse and 3rd degree cystocele. She is doing well with her ring pessary and vagifem but she is unable to remove it herself. She comes here monthly for its cleaning and checking on her vaginal mucosa. She did skip her appt last month but she is aware of the potential for bladder and/or bowel erosion if the mucosa is not checked regularly.  Review of Systems     Objective:   Physical Exam  No vaginal irritation or foul discharge      Assessment & Plan:  Symptomatic prolapse- continue pessary/vagifem RTC monthly.

## 2012-02-21 ENCOUNTER — Encounter: Payer: Self-pay | Admitting: Obstetrics & Gynecology

## 2012-02-21 ENCOUNTER — Ambulatory Visit (INDEPENDENT_AMBULATORY_CARE_PROVIDER_SITE_OTHER): Payer: Medicare Other | Admitting: Obstetrics & Gynecology

## 2012-02-21 VITALS — BP 151/79 | HR 67 | Temp 97.0°F | Resp 12 | Ht 61.0 in | Wt 123.7 lb

## 2012-02-21 DIAGNOSIS — N814 Uterovaginal prolapse, unspecified: Secondary | ICD-10-CM | POA: Diagnosis not present

## 2012-02-21 NOTE — Progress Notes (Signed)
  Subjective:    Patient ID: [name], female    DOB: 11/15/1932, 78 y.o.   MRN: [id number]  HPI  She is here for her monthly pessary. She has no complaints. She is using her Vagifem twice weekly.  Review of Systems     Objective:   Physical Exam  Good estrogen effect of mucosa No evidence of erosions Pessary cleaned and replaced Normal bimanual exam     Assessment & Plan:   Prolapse- pessary cleaned and replaced RTC 1 month.

## 2012-03-23 ENCOUNTER — Encounter: Payer: Self-pay | Admitting: Obstetrics & Gynecology

## 2012-03-23 ENCOUNTER — Ambulatory Visit (INDEPENDENT_AMBULATORY_CARE_PROVIDER_SITE_OTHER): Payer: Medicare Other | Admitting: Obstetrics & Gynecology

## 2012-03-23 VITALS — BP 143/87 | HR 72 | Temp 97.0°F | Ht 60.0 in | Wt 122.2 lb

## 2012-03-23 DIAGNOSIS — N814 Uterovaginal prolapse, unspecified: Secondary | ICD-10-CM

## 2012-03-23 NOTE — Progress Notes (Signed)
  Subjective:    Patient ID: [name], female    DOB: [birth year], 78 y.o.   MRN: [id number]  HPI  She is here for her monthly pessary changing/cleaning. She has no gyn issues.  Review of Systems     Objective:   Physical Exam  Good estrogen effect in vagina. No irritation. Healthy vagina.      Assessment & Plan:   RTC 1 month.

## 2012-03-24 NOTE — Progress Notes (Signed)
Late Entry from 03/23/12: SW received call from Clinic staff requesting a consult by SW.  Dr. Primitivo Gauze reported to SW that patient would like a referral for counseling.  SW met with patient and her husband.  Both husband and patient were eager to talk to SW.  There is clear dysfunction in this household and turmoil in the marriage.  SW discussed their situation for nearly two hours.  At one point patient removed her wedding band, but relatively quickly put it back on.  SW attempted to employ a strength based approach to point out positive aspects of the relationship and common ground.  SW made multiple recommendations for follow up with an outpatient counselor, but neither party agreed to accept referral.  The couple stated a strong faith in [place] suggested that they find a trusted church member or paster whom they could talk with on a regular basis.  Patient states she is going to call an apostle she knows.  SW thanked couple for their openness in discussing their situation and notes that the conversation was productive.  SW gave them contact information and asked them to please contact SW if they change their mind regarding a referral to outpatient counseling.

## 2012-04-16 ENCOUNTER — Other Ambulatory Visit: Payer: Self-pay | Admitting: Internal Medicine

## 2012-04-17 ENCOUNTER — Other Ambulatory Visit: Payer: Self-pay | Admitting: Internal Medicine

## 2012-04-22 ENCOUNTER — Ambulatory Visit: Payer: Medicare Other | Admitting: Obstetrics & Gynecology

## 2012-04-27 ENCOUNTER — Ambulatory Visit: Payer: Medicare Other | Admitting: Obstetrics & Gynecology

## 2012-05-06 ENCOUNTER — Ambulatory Visit: Payer: Medicare Other | Admitting: Obstetrics & Gynecology

## 2012-05-27 ENCOUNTER — Encounter: Payer: Self-pay | Admitting: Obstetrics & Gynecology

## 2012-05-27 ENCOUNTER — Ambulatory Visit (INDEPENDENT_AMBULATORY_CARE_PROVIDER_SITE_OTHER): Payer: Medicare Other | Admitting: Obstetrics & Gynecology

## 2012-05-27 VITALS — BP 146/80 | HR 60 | Temp 96.8°F | Ht 60.0 in | Wt 121.1 lb

## 2012-05-27 DIAGNOSIS — N812 Incomplete uterovaginal prolapse: Secondary | ICD-10-CM | POA: Diagnosis not present

## 2012-05-27 NOTE — Progress Notes (Signed)
  Subjective:    Patient ID: [name], female    DOB: [birth year], 78 y.o.   MRN: [id number]  HPI  78yo lady who is here for her monthly pessary cleaning. She has no complaints.  Review of Systems     Objective:   Physical Exam  I removed and cleaned her pessary. I used a speculum to inspected her mucosa. All was normal.      Assessment & Plan:  Prolapse- RTC 1 month/prn sooner.

## 2012-06-20 ENCOUNTER — Other Ambulatory Visit: Payer: Self-pay | Admitting: Internal Medicine

## 2012-06-22 ENCOUNTER — Other Ambulatory Visit: Payer: Self-pay | Admitting: Internal Medicine

## 2012-06-24 ENCOUNTER — Ambulatory Visit (INDEPENDENT_AMBULATORY_CARE_PROVIDER_SITE_OTHER): Payer: Medicare Other | Admitting: Obstetrics & Gynecology

## 2012-06-24 ENCOUNTER — Encounter: Payer: Self-pay | Admitting: Obstetrics & Gynecology

## 2012-06-24 VITALS — BP 134/82 | HR 62 | Temp 96.8°F | Ht 60.0 in | Wt 120.1 lb

## 2012-06-24 DIAGNOSIS — N813 Complete uterovaginal prolapse: Secondary | ICD-10-CM

## 2012-06-24 NOTE — Progress Notes (Signed)
  Subjective:    Patient ID: [name], female    DOB: [birth year], 79 y.o.   MRN: [id number]  HPI  [name] is here for her monthly pessary cleaning. She denies problems.  Review of Systems     Objective:   Physical Exam  Normal vaginal mucosa, no erosions with thorough speculum exam      Assessment & Plan:  Complete procedentia- continue pessary with monthly cleanings.

## 2012-09-03 ENCOUNTER — Telehealth: Payer: Self-pay

## 2012-09-03 NOTE — Telephone Encounter (Signed)
Appointment made with Dr. Carlean Purl for 10/19/12 at 1:30pm.  She is aware that her labs are overdue and will try and come soon, orders in epic already.

## 2012-09-03 NOTE — Telephone Encounter (Signed)
Message copied by [place], Malcomb Gangemi E on Thu Sep 03, 2012  1:26 PM ------      Message from: Silvano Rusk E      Created: Wed Sep 02, 2012  3:36 PM      Regarding: needs labs and appt       Please contact her and tell her I recommend she see me in follow-up (april or May)      She also needs CBC and LFT's because she is on 6 MP ------

## 2012-09-12 ENCOUNTER — Other Ambulatory Visit: Payer: Self-pay | Admitting: Internal Medicine

## 2012-09-16 ENCOUNTER — Encounter: Payer: Self-pay | Admitting: Obstetrics & Gynecology

## 2012-09-16 ENCOUNTER — Ambulatory Visit (INDEPENDENT_AMBULATORY_CARE_PROVIDER_SITE_OTHER): Payer: Medicare Other | Admitting: Obstetrics & Gynecology

## 2012-09-16 VITALS — BP 163/88 | HR 67 | Temp 97.0°F | Resp 20 | Ht 60.0 in | Wt 126.1 lb

## 2012-09-16 DIAGNOSIS — N813 Complete uterovaginal prolapse: Secondary | ICD-10-CM | POA: Diagnosis not present

## 2012-09-16 NOTE — Progress Notes (Signed)
  Subjective:    Patient ID: [name], female    DOB: 09/30/1932, 79 y.o.   MRN: [id number]  HPI  [name] is a 79 yo lady with prolapse of her uterus, cystocele and rectocele. She comes in monthly to have her pessary removed and cleaned. She has no complaints today. She once again mentions her urinary incontinence (wears Depends). I once again offered her urology consult but she again declines.  Review of Systems  Her mammogram is UTD.   Objective:   Physical Exam   The pessary was removed and cleaned. A speculum exam showed her vagina to have no erosions or other abnormalities.     Assessment & Plan:   Prolapse- continue current care

## 2012-09-16 NOTE — Progress Notes (Signed)
Pt states she does not use the mercaptopurine twice weekly.

## 2012-10-15 ENCOUNTER — Ambulatory Visit: Payer: Medicare Other | Admitting: Obstetrics & Gynecology

## 2012-10-19 ENCOUNTER — Ambulatory Visit: Payer: Medicare Other | Admitting: Internal Medicine

## 2012-10-19 ENCOUNTER — Telehealth: Payer: Self-pay

## 2012-10-19 NOTE — Telephone Encounter (Signed)
Patient called in to make sure we had taken her off the schedule for today.  She had a lot going on last time we spoke and did not remember [place] speaking ( husband passed away). She will call back to R/S and I reminded her to come get her labs drawn.

## 2012-10-28 ENCOUNTER — Ambulatory Visit (INDEPENDENT_AMBULATORY_CARE_PROVIDER_SITE_OTHER): Payer: Medicare Other | Admitting: Obstetrics & Gynecology

## 2012-10-28 ENCOUNTER — Encounter: Payer: Self-pay | Admitting: Obstetrics & Gynecology

## 2012-10-28 VITALS — BP 139/80 | HR 65 | Temp 97.3°F | Ht 60.0 in | Wt 123.1 lb

## 2012-10-28 DIAGNOSIS — E559 Vitamin D deficiency, unspecified: Secondary | ICD-10-CM | POA: Diagnosis not present

## 2012-10-28 DIAGNOSIS — Z01419 Encounter for gynecological examination (general) (routine) without abnormal findings: Secondary | ICD-10-CM

## 2012-10-28 DIAGNOSIS — R5381 Other malaise: Secondary | ICD-10-CM | POA: Diagnosis not present

## 2012-10-28 DIAGNOSIS — N814 Uterovaginal prolapse, unspecified: Secondary | ICD-10-CM | POA: Diagnosis not present

## 2012-10-28 DIAGNOSIS — Z13220 Encounter for screening for lipoid disorders: Secondary | ICD-10-CM

## 2012-10-28 DIAGNOSIS — R5383 Other fatigue: Secondary | ICD-10-CM

## 2012-10-28 DIAGNOSIS — Z0000 Encounter for general adult medical examination without abnormal findings: Secondary | ICD-10-CM

## 2012-10-28 DIAGNOSIS — E7800 Pure hypercholesterolemia, unspecified: Secondary | ICD-10-CM

## 2012-10-28 NOTE — Addendum Note (Signed)
Addended by: Emily Filbert on: 10/28/2012 02:50 PM   Modules accepted: Orders

## 2012-10-28 NOTE — Progress Notes (Signed)
  Subjective:    Patient ID: [name], female    DOB: 1932-10-19, 79 y.o.   MRN: [id number]  HPI  [name] is here for her monthly pessary cleaning. Sadly, her husband died 31-Oct-2012 of a MI. She is grieving, but not abnormally so.  Review of Systems    Her mammogram is due next month Objective:   Physical Exam  Normal vaginal mucosa/no evidence of any erosions      Assessment & Plan:  Uterine prolapse- continue monthly pessary cleanings Preventative care- schedule mammogram and annual exam at next visit

## 2012-10-28 NOTE — Addendum Note (Signed)
Addended by: Emily Filbert on: 10/28/2012 03:03 PM   Modules accepted: Orders

## 2012-11-30 ENCOUNTER — Other Ambulatory Visit: Payer: Medicare Other

## 2012-12-01 ENCOUNTER — Other Ambulatory Visit: Payer: Medicare Other

## 2012-12-01 DIAGNOSIS — E559 Vitamin D deficiency, unspecified: Secondary | ICD-10-CM

## 2012-12-01 DIAGNOSIS — Z0000 Encounter for general adult medical examination without abnormal findings: Secondary | ICD-10-CM

## 2012-12-01 DIAGNOSIS — R5383 Other fatigue: Secondary | ICD-10-CM

## 2012-12-01 DIAGNOSIS — Z13220 Encounter for screening for lipoid disorders: Secondary | ICD-10-CM | POA: Diagnosis not present

## 2012-12-01 DIAGNOSIS — E7800 Pure hypercholesterolemia, unspecified: Secondary | ICD-10-CM

## 2012-12-01 DIAGNOSIS — R5381 Other malaise: Secondary | ICD-10-CM | POA: Diagnosis not present

## 2012-12-01 LAB — LIPID PANEL
HDL: 38 mg/dL — ABNORMAL LOW (ref >39)
LDL Cholesterol: 102 mg/dL — ABNORMAL HIGH (ref 0–99)
Triglycerides: 150 mg/dL — ABNORMAL HIGH (ref <150)
VLDL: 30 mg/dL (ref 0–40)

## 2012-12-01 LAB — CBC
HCT: 36.8 % (ref 36.0–46.0)
Hemoglobin: 13 g/dL (ref 12.0–15.0)
RBC: 3.68 MIL/uL — ABNORMAL LOW (ref 3.87–5.11)
RDW: 15.5 % (ref 11.5–15.5)
WBC: 2.3 10*3/uL — ABNORMAL LOW (ref 4.0–10.5)

## 2012-12-01 LAB — COMPREHENSIVE METABOLIC PANEL
Albumin: 4.2 g/dL (ref 3.5–5.2)
Alkaline Phosphatase: 72 U/L (ref 39–117)
BUN: 14 mg/dL (ref 6–23)
CO2: 26 mEq/L (ref 19–32)
Calcium: 10.9 mg/dL — ABNORMAL HIGH (ref 8.4–10.5)
Glucose, Bld: 91 mg/dL (ref 70–99)
Potassium: 4.4 mEq/L (ref 3.5–5.3)
Total Protein: 6.6 g/dL (ref 6.0–8.3)

## 2012-12-02 ENCOUNTER — Encounter: Payer: Self-pay | Admitting: Obstetrics & Gynecology

## 2012-12-02 ENCOUNTER — Ambulatory Visit (INDEPENDENT_AMBULATORY_CARE_PROVIDER_SITE_OTHER): Payer: Medicare Other | Admitting: Obstetrics & Gynecology

## 2012-12-02 VITALS — BP 136/82 | HR 68 | Temp 97.5°F | Wt 119.1 lb

## 2012-12-02 DIAGNOSIS — D729 Disorder of white blood cells, unspecified: Secondary | ICD-10-CM

## 2012-12-02 DIAGNOSIS — Z0000 Encounter for general adult medical examination without abnormal findings: Secondary | ICD-10-CM

## 2012-12-02 DIAGNOSIS — D7289 Other specified disorders of white blood cells: Secondary | ICD-10-CM

## 2012-12-02 NOTE — Progress Notes (Signed)
Subjective:    [name] is a 79 y.o. female who presents for an annual exam. The patient has no complaints today. Her pessary will need to be cleaned today. The patient is not currently sexually active as her husband died recently. GYN screening history: last pap: was normal. The patient wears seatbelts: yes. The patient participates in regular exercise: no. Has the patient ever been transfused or tattooed?: no. The patient reports that there is not domestic violence in her life.   Menstrual History: OB History   Grav Para Term Preterm Abortions TAB SAB Ect Mult Living   3 3 3       3       Menarche age: 39  No LMP recorded. Patient is postmenopausal.    The following portions of the patient's history were reviewed and updated as appropriate: allergies, current medications, past family history, past medical history, past social history, past surgical history and problem list.  Review of Systems A comprehensive review of systems was negative.    Objective:    BP 136/82  Pulse 68  Temp(Src) 97.5 F (36.4 C) (Oral)  Wt 119 lb 1.6 oz (54.023 kg)  BMI 23.26 kg/m2  General Appearance:    Alert, cooperative, no distress, appears stated age  Head:    Normocephalic, without obvious abnormality, atraumatic  Eyes:    PERRL, conjunctiva/corneas clear, EOM's intact, fundi    benign, both eyes  Ears:    Normal TM's and external ear canals, both ears  Nose:   Nares normal, septum midline, mucosa normal, no drainage    or sinus tenderness  Throat:   Lips, mucosa, and tongue normal; teeth and gums normal  Neck:   Supple, symmetrical, trachea midline, no adenopathy;    thyroid:  no enlargement/tenderness/nodules; no carotid   bruit or JVD  Back:     Symmetric, no curvature, ROM normal, no CVA tenderness  Lungs:     Clear to auscultation bilaterally, respirations unlabored  Chest Wall:    No tenderness or deformity   Heart:    Regular rate and rhythm, S1 and S2 normal, no murmur, rub   or  gallop  Breast Exam:    No tenderness, masses, or nipple abnormality, implants bilaterally  Abdomen:     Soft, non-tender, bowel sounds active all four quadrants,    no masses, no organomegaly  Genitalia:    Normal female without lesion, discharge or tenderness, minimal atrophy, no vaginal erosions, normal bimanual exam     Extremities:   Extremities normal, atraumatic, no cyanosis or edema  Pulses:   2+ and symmetric all extremities  Skin:   Skin color, texture, turgor normal, no rashes or lesions  Lymph nodes:   Cervical, supraclavicular, and axillary nodes normal  Neurologic:   CNII-XII intact, normal strength, sensation and reflexes    throughout  .    Assessment:    Healthy female exam.  Persistently low WBC   Plan:     Breast self exam technique reviewed and patient encouraged to perform self-exam monthly. Mammogram.  RTC monthly for pessary cleaning Internal Medicine referral (re WBC)

## 2012-12-07 ENCOUNTER — Ambulatory Visit (HOSPITAL_COMMUNITY): Payer: Medicare Other

## 2012-12-15 ENCOUNTER — Ambulatory Visit (HOSPITAL_COMMUNITY): Admission: RE | Admit: 2012-12-15 | Payer: Medicare Other | Source: Ambulatory Visit

## 2012-12-22 ENCOUNTER — Other Ambulatory Visit: Payer: Self-pay | Admitting: Obstetrics & Gynecology

## 2012-12-22 ENCOUNTER — Ambulatory Visit (HOSPITAL_COMMUNITY)
Admission: RE | Admit: 2012-12-22 | Discharge: 2012-12-22 | Disposition: A | Discharge status: Home / Self Care | Payer: Medicare Other | Source: Ambulatory Visit | Attending: Obstetrics & Gynecology | Admitting: Obstetrics & Gynecology

## 2012-12-22 DIAGNOSIS — Z0000 Encounter for general adult medical examination without abnormal findings: Secondary | ICD-10-CM

## 2012-12-22 DIAGNOSIS — Z1231 Encounter for screening mammogram for malignant neoplasm of breast: Secondary | ICD-10-CM

## 2013-01-06 ENCOUNTER — Ambulatory Visit: Payer: Medicare Other | Admitting: Obstetrics & Gynecology

## 2013-01-06 ENCOUNTER — Telehealth: Payer: Self-pay | Admitting: Internal Medicine

## 2013-01-06 MED ORDER — MERCAPTOPURINE 50 MG PO TABS: ORAL_TABLET | ORAL | Status: DC

## 2013-01-06 NOTE — Telephone Encounter (Signed)
Left detailed message to call for appointment, overdue.  Also needs labs, orders in.  Refilled her mercaptopurine to cover until she can come in.

## 2013-01-07 ENCOUNTER — Other Ambulatory Visit: Payer: Self-pay | Admitting: Internal Medicine

## 2013-02-24 ENCOUNTER — Other Ambulatory Visit: Payer: Self-pay | Admitting: Internal Medicine

## 2013-03-03 ENCOUNTER — Telehealth: Payer: Self-pay | Admitting: Internal Medicine

## 2013-03-03 MED ORDER — MERCAPTOPURINE 50 MG PO TABS: ORAL_TABLET | ORAL | Status: DC

## 2013-03-03 NOTE — Telephone Encounter (Signed)
Refill sent in as requested. 

## 2013-03-30 ENCOUNTER — Telehealth: Payer: Self-pay | Admitting: Internal Medicine

## 2013-03-30 NOTE — Telephone Encounter (Signed)
Per Barb Merino, CGRN she needs to keep her appointment for tomorrow and we will get labs tomorrow.  I left this detailed message on her cell voice mail # [phone], her home # doesn't take messages.

## 2013-03-31 ENCOUNTER — Encounter: Payer: Self-pay | Admitting: Internal Medicine

## 2013-03-31 ENCOUNTER — Ambulatory Visit (INDEPENDENT_AMBULATORY_CARE_PROVIDER_SITE_OTHER): Payer: Medicare Other | Admitting: Internal Medicine

## 2013-03-31 ENCOUNTER — Other Ambulatory Visit (INDEPENDENT_AMBULATORY_CARE_PROVIDER_SITE_OTHER): Payer: Medicare Other

## 2013-03-31 VITALS — BP 116/74 | HR 64 | Ht 61.0 in | Wt 115.0 lb

## 2013-03-31 DIAGNOSIS — E559 Vitamin D deficiency, unspecified: Secondary | ICD-10-CM

## 2013-03-31 DIAGNOSIS — K5000 Crohn's disease of small intestine without complications: Secondary | ICD-10-CM | POA: Diagnosis not present

## 2013-03-31 DIAGNOSIS — E210 Primary hyperparathyroidism: Secondary | ICD-10-CM | POA: Diagnosis not present

## 2013-03-31 DIAGNOSIS — M810 Age-related osteoporosis without current pathological fracture: Secondary | ICD-10-CM

## 2013-03-31 DIAGNOSIS — Z79899 Other long term (current) drug therapy: Secondary | ICD-10-CM

## 2013-03-31 LAB — COMPREHENSIVE METABOLIC PANEL
ALT: 17 U/L (ref 0–35)
AST: 19 U/L (ref 0–37)
Albumin: 4 g/dL (ref 3.5–5.2)
Calcium: 10.7 mg/dL — ABNORMAL HIGH (ref 8.4–10.5)
Chloride: 108 mEq/L (ref 96–112)
Creatinine, Ser: 0.7 mg/dL (ref 0.4–1.2)
Glucose, Bld: 84 mg/dL (ref 70–99)
Potassium: 4.4 mEq/L (ref 3.5–5.1)
Sodium: 140 mEq/L (ref 135–145)
Total Bilirubin: 1.3 mg/dL — ABNORMAL HIGH (ref 0.3–1.2)
Total Protein: 6.9 g/dL (ref 6.0–8.3)

## 2013-03-31 LAB — CBC WITH DIFFERENTIAL/PLATELET
Basophils Relative: 0.5 % (ref 0.0–3.0)
Eosinophils Absolute: 0.1 10*3/uL (ref 0.0–0.7)
Eosinophils Relative: 3.5 % (ref 0.0–5.0)
Hemoglobin: 14.1 g/dL (ref 12.0–15.0)
Lymphocytes Relative: 16.3 % (ref 12.0–46.0)
MCHC: 35 g/dL (ref 30.0–36.0)
MCV: 102 fl — ABNORMAL HIGH (ref 78.0–100.0)
Monocytes Absolute: 0.3 10*3/uL (ref 0.1–1.0)
Neutro Abs: 2.5 10*3/uL (ref 1.4–7.7)
RBC: 3.96 Mil/uL (ref 3.87–5.11)
WBC: 3.5 10*3/uL — ABNORMAL LOW (ref 4.5–10.5)

## 2013-03-31 MED ORDER — CHOLECALCIFEROL 1.25 MG (50000 UT) PO TABS: 1.0000 | ORAL_TABLET | ORAL | Status: AC

## 2013-03-31 MED ORDER — MERCAPTOPURINE 50 MG PO TABS
ORAL_TABLET | ORAL | Status: DC
Start: 2013-03-31 — End: 2013-05-16

## 2013-03-31 NOTE — Patient Instructions (Signed)
Your physician has requested that you go to the basement for lab work before leaving today.  We have sent the following medications to your pharmacy for you to pick up at your convenience: Vitamin D  We are giving you a printed rx for 6MP to take to the pharmacy when you need it.  Follow up with [place] in 2 months.  I appreciate the opportunity to care for you.

## 2013-03-31 NOTE — Progress Notes (Signed)
  Subjective:    Patient ID: [name], female    DOB: 20-Jul-1932, 79 y.o.   MRN: [id number]  HPI The patient returns after a long absence. Her husband died suddenly in the spring, she was grieving and as she says in the fall but she really wasn't taking care of her self and she stopped her medications. She is now ready resume her treatment and medical followup. She denies any abdominal complaints with diarrhea or abdominal pain. She continues to have a pessary in place for her bladder prolapse. She feels like she is coming out of her brief situation and ready to move forward with her life after her husband's death. Medications, allergies, past medical history, past surgical history, family history and social history are reviewed and updated in the EMR.  Review of Systems As per history of present illness    Objective:   Physical Exam  General:  Elderly white woman in no acute distress Eyes:   anicteric Lungs:  clear Heart:  S1S2 no rubs, murmurs or gallops Abdomen:  soft and nontender, BS+ Ext:   no edema    Assessment & Plan:   1. Regional enteritis of small intestine   2. VITAMIN D DEFICIENCY   3. Primary hyperparathyroidism   4. OSTEOPOROSIS

## 2013-03-31 NOTE — Assessment & Plan Note (Addendum)
Restart 6 MP Influenza and pneumovax vaccines recommended - she declines Check Labs today  RTC December

## 2013-03-31 NOTE — Assessment & Plan Note (Signed)
Recheck Ca++, PTH, PO4

## 2013-03-31 NOTE — Assessment & Plan Note (Addendum)
She had not exhibited signs of toxicity recently. We'll recheck her labs today. She has declined influenza and pneumonia vaccine today

## 2013-03-31 NOTE — Assessment & Plan Note (Signed)
Probably needs DEXA again - do not want to rush this right now as she is coming back into medical care

## 2013-03-31 NOTE — Assessment & Plan Note (Signed)
Vit 50 KU weekly x 8 weeks

## 2013-04-07 ENCOUNTER — Ambulatory Visit: Payer: Medicare Other | Admitting: Obstetrics & Gynecology

## 2013-04-14 ENCOUNTER — Ambulatory Visit (INDEPENDENT_AMBULATORY_CARE_PROVIDER_SITE_OTHER): Payer: Medicare Other | Admitting: Obstetrics & Gynecology

## 2013-04-14 ENCOUNTER — Encounter: Payer: Self-pay | Admitting: Obstetrics & Gynecology

## 2013-04-14 VITALS — BP 182/86 | HR 61 | Temp 96.4°F | Ht 60.0 in | Wt 118.0 lb

## 2013-04-14 DIAGNOSIS — Z4689 Encounter for fitting and adjustment of other specified devices: Secondary | ICD-10-CM

## 2013-04-14 DIAGNOSIS — N814 Uterovaginal prolapse, unspecified: Secondary | ICD-10-CM | POA: Diagnosis not present

## 2013-04-14 NOTE — Progress Notes (Signed)
  Subjective:    Patient ID: [name], female    DOB: 09-26-1932, 79 y.o.   MRN: [id number]  HPI  [name] is here today for pessary cleaning. She didn't realize it, but it has been 6 months since she has been here and the pessary has been in place during this whole time.  Review of Systems She declines a flu vaccine.    Objective:   Physical Exam  Her vagina is healthy appearing, no excoriations. A bimanual exam is normal as well.      Assessment & Plan:  Prolapse- doing well with the pessary I have stressed the importance of getting the pessary removed monthly and having her vagina checked for excoriations/abrasions. RTC 1 month

## 2013-04-14 NOTE — Patient Instructions (Signed)
Influenza A (H1N1) H1N1 formerly called "swine flu" is a new influenza virus causing sickness in people. The H1N1 virus is different from seasonal influenza viruses. However, the H1N1 symptoms are similar to seasonal influenza and it is spread from person to person. You may be at higher risk for serious problems if you have underlying serious medical conditions. The CDC and the [facility] are following reported cases around the world. CAUSES   The flu is thought to spread mainly person-to-person through coughing or sneezing of infected people.  A person may become infected by touching something with the virus on it and then touching their mouth or nose. SYMPTOMS   Fever.  Headache.  Tiredness.  Cough.  Sore throat.  Runny or stuffy nose.  Body aches.  Diarrhea and vomiting These symptoms are referred to as "flu-like symptoms." A lot of different illnesses, including the common cold, may have similar symptoms. DIAGNOSIS   There are tests that can tell if you have the H1N1 virus.  Confirmed cases of H1N1 will be reported to the state or local health department.  A doctor's exam may be needed to tell whether you have an infection that is a complication of the flu. HOME CARE INSTRUCTIONS   Stay informed. Visit the [facility] website for current recommendations. Visit [employer]. You may also call 1-800-CDC-INFO 317-808-8055).  Get help early if you develop any of the above symptoms.  If you are at high risk from complications of the flu, talk to your caregiver as soon as you develop flu-like symptoms. Those at higher risk for complications include:  People 65 years or older.  People with chronic medical conditions.  Pregnant women.  Young children.  Your caregiver may recommend antiviral medicine to help treat the flu.  If you get the flu, get plenty of rest, drink enough water and fluids to keep your urine clear or pale yellow, and avoid using  alcohol or tobacco.  You may take over-the-counter medicine to relieve the symptoms of the flu if your caregiver approves. (Never give aspirin to children or teenagers who have flu-like symptoms, particularly fever). TREATMENT  If you do get sick, antiviral drugs are available. These drugs can make your illness milder and make you feel better faster. Treatment should start soon after illness starts. It is only effective if taken within the first day of becoming ill. Only your caregiver can prescribe antiviral medication.  PREVENTION   Cover your nose and mouth with a tissue or your arm when you cough or sneeze. Throw the tissue away.  Wash your hands often with soap and warm water, especially after you cough or sneeze. Alcohol-based cleaners are also effective against germs.  Avoid touching your eyes, nose or mouth. This is one way germs spread.  Try to avoid contact with sick people. Follow public health advice regarding school closures. Avoid crowds.  Stay home if you get sick. Limit contact with others to keep from infecting them. People infected with the H1N1 virus may be able to infect others anywhere from 1 day before feeling sick to 5-7 days after getting flu symptoms.  An H1N1 vaccine is available to help protect against the virus. In addition to the H1N1 vaccine, you will need to be vaccinated for seasonal influenza. The H1N1 and seasonal vaccines may be given on the same day. The CDC especially recommends the H1N1 vaccine for:  Pregnant women.  People who live with or care for children younger than 95 months of age.  Health care and emergency services personnel.  Persons between the ages of 64 months through 38 years of age.  People from ages 73 through 17 years who are at higher risk for H1N1 because of chronic health disorders or immune system problems. FACEMASKS In community and home settings, the use of facemasks and N95 respirators are not normally recommended. In certain  circumstances, a facemask or N95 respirator may be used for persons at increased risk of severe illness from influenza. Your caregiver can give additional recommendations for facemask use. IN CHILDREN, EMERGENCY WARNING SIGNS THAT NEED URGENT MEDICAL CARE:  Fast breathing or trouble breathing.  Bluish skin color.  Not drinking enough fluids.  Not waking up or not interacting normally.  Being so fussy that the child does not want to be held.  Your child has an oral temperature above 102 F (38.9 C), not controlled by medicine.  Your baby is older than 3 months with a rectal temperature of 102 F (38.9 C) or higher.  Your baby is 66 months old or younger with a rectal temperature of 100.4 F (38 C) or higher.  Flu-like symptoms improve but then return with fever and worse cough. IN ADULTS, EMERGENCY WARNING SIGNS THAT NEED URGENT MEDICAL CARE:  Difficulty breathing or shortness of breath.  Pain or pressure in the chest or abdomen.  Sudden dizziness.  Confusion.  Severe or persistent vomiting.  Bluish color.  You have a oral temperature above 102 F (38.9 C), not controlled by medicine.  Flu-like symptoms improve but return with fever and worse cough. SEEK IMMEDIATE MEDICAL CARE IF:  You or someone you know is experiencing any of the above symptoms. When you arrive at the emergency center, report that you think you have the flu. You may be asked to wear a mask and/or sit in a secluded area to protect others from getting sick. MAKE SURE YOU:   Understand these instructions.  Will watch your condition.  Will get help right away if you are not doing well or get worse. Some of this information courtesy of the CDC.  Document Released: 11/13/2007 Document Revised: 08/19/2011 Document Reviewed: 11/13/2007 [facility] Patient Information 2014 [place], [place].

## 2013-04-19 NOTE — Progress Notes (Signed)
Quick Note:  Calcium and PTH are high Needs to take her vitamin D and we can recheck labs after that is replaced Please remind her to take the vit D Do not take calcium supplements right now  ______

## 2013-05-10 DIAGNOSIS — W19XXXA Unspecified fall, initial encounter: Secondary | ICD-10-CM

## 2013-05-10 HISTORY — DX: Unspecified fall, initial encounter: W19.XXXA

## 2013-05-16 ENCOUNTER — Emergency Department (HOSPITAL_COMMUNITY): Payer: Medicare Other

## 2013-05-16 ENCOUNTER — Encounter (HOSPITAL_COMMUNITY): Payer: Self-pay | Admitting: Emergency Medicine

## 2013-05-16 ENCOUNTER — Emergency Department (HOSPITAL_COMMUNITY)
Admission: EM | Admit: 2013-05-16 | Discharge: 2013-05-16 | Disposition: A | Discharge status: Home / Self Care | Payer: Medicare Other | Attending: Emergency Medicine | Admitting: Emergency Medicine

## 2013-05-16 DIAGNOSIS — S32591A Other specified fracture of right pubis, initial encounter for closed fracture: Secondary | ICD-10-CM

## 2013-05-16 DIAGNOSIS — S79919A Unspecified injury of unspecified hip, initial encounter: Secondary | ICD-10-CM | POA: Diagnosis not present

## 2013-05-16 DIAGNOSIS — Z7982 Long term (current) use of aspirin: Secondary | ICD-10-CM | POA: Diagnosis not present

## 2013-05-16 DIAGNOSIS — Y9301 Activity, walking, marching and hiking: Secondary | ICD-10-CM | POA: Insufficient documentation

## 2013-05-16 DIAGNOSIS — N390 Urinary tract infection, site not specified: Secondary | ICD-10-CM

## 2013-05-16 DIAGNOSIS — M25559 Pain in unspecified hip: Secondary | ICD-10-CM | POA: Diagnosis not present

## 2013-05-16 DIAGNOSIS — M810 Age-related osteoporosis without current pathological fracture: Secondary | ICD-10-CM | POA: Insufficient documentation

## 2013-05-16 DIAGNOSIS — E559 Vitamin D deficiency, unspecified: Secondary | ICD-10-CM | POA: Insufficient documentation

## 2013-05-16 DIAGNOSIS — W010XXA Fall on same level from slipping, tripping and stumbling without subsequent striking against object, initial encounter: Secondary | ICD-10-CM | POA: Insufficient documentation

## 2013-05-16 DIAGNOSIS — Y929 Unspecified place or not applicable: Secondary | ICD-10-CM | POA: Insufficient documentation

## 2013-05-16 DIAGNOSIS — Z79899 Other long term (current) drug therapy: Secondary | ICD-10-CM | POA: Diagnosis not present

## 2013-05-16 DIAGNOSIS — S32509A Unspecified fracture of unspecified pubis, initial encounter for closed fracture: Secondary | ICD-10-CM | POA: Insufficient documentation

## 2013-05-16 LAB — URINALYSIS, ROUTINE W REFLEX MICROSCOPIC
Bilirubin Urine: NEGATIVE
Hgb urine dipstick: NEGATIVE
Ketones, ur: NEGATIVE mg/dL
Nitrite: POSITIVE — AB
Specific Gravity, Urine: 1.013 (ref 1.005–1.030)
Urobilinogen, UA: 0.2 mg/dL (ref 0.0–1.0)

## 2013-05-16 LAB — URINE MICROSCOPIC-ADD ON

## 2013-05-16 MED ORDER — OXYCODONE-ACETAMINOPHEN 5-325 MG PO TABS: 1.0000 | ORAL_TABLET | ORAL | Status: DC | PRN

## 2013-05-16 MED ORDER — CIPROFLOXACIN HCL 500 MG PO TABS: 500.0000 mg | ORAL_TABLET | Freq: Two times a day (BID) | ORAL | Status: DC

## 2013-05-16 NOTE — ED Provider Notes (Signed)
CSN: [id number]     Arrival date & time 05/16/13  1107 History   First MD Initiated Contact with Patient 05/16/13 1114     Chief Complaint  Patient presents with  . Fall  . Hip Pain   (Consider location/radiation/quality/duration/timing/severity/associated sxs/prior Treatment) HPI Comments: Patient presents with right hip pain after fall. She states that she was walking and her cat ran out in front of her and she slipped on some water that was on the floor. She landed over on her bottom. She's had a previous hip fracture on the left s/p hemiarthoplasty and a pelvic fracture but denies any past fractures of the right hip. She denies hitting her head. Her daughter witnessed the fall and states she did not hit her head. She denies any neck or back pain. She denies any other injuries from the fall.  Patient is a 79 y.o. female presenting with fall and hip pain.  Fall Pertinent negatives include no chest pain, no abdominal pain, no headaches and no shortness of breath.  Hip Pain Pertinent negatives include no chest pain, no abdominal pain, no headaches and no shortness of breath.    Past Medical History  Diagnosis Date  . Crohn's disease   . Bladder prolapse, female, acquired     pessary in  . Osteoporosis   . Pubic ramus fracture   . Hyperparathyroidism   . Vitamin D deficiency   . History of hypercalcemia   . History of hyperglycemia   . Hydronephrosis   . Urinary incontinence 09/09/2011  . Complete uterine prolapse    Past Surgical History  Procedure Laterality Date  . Hip fracture surgery Left     left, prosthesis  . Rhinoplasty    . Tonsillectomy    . Laparoscopy  10/2009  . Colonoscopy  12/23/2008    crohn's, diverticulosis, internal hemorrhoids   Family History  Problem Relation Age of Onset  . Heart disease Father   . Colon cancer Father   . Osteoporosis Mother   . Breast cancer Mother    History  Substance Use Topics  . Smoking status: Never Smoker   . Smokeless  tobacco: Never Used  . Alcohol Use: No     Comment: wine   OB History   Grav Para Term Preterm Abortions TAB SAB Ect Mult Living   3 3 3       3      Review of Systems  Constitutional: Negative for fever, chills, diaphoresis and fatigue.  HENT: Negative for congestion, rhinorrhea and sneezing.   Eyes: Negative.   Respiratory: Negative for cough, chest tightness and shortness of breath.   Cardiovascular: Negative for chest pain and leg swelling.  Gastrointestinal: Negative for nausea, vomiting, abdominal pain, diarrhea and blood in stool.  Genitourinary: Negative for frequency, hematuria, flank pain and difficulty urinating.  Musculoskeletal: Positive for arthralgias. Negative for back pain.  Skin: Negative for wound.  Neurological: Negative for dizziness, speech difficulty, weakness, numbness and headaches.    Allergies  Review of patient's allergies indicates no known allergies.  Home Medications   Current Outpatient Rx  Name  Route  Sig  Dispense  Refill  . aspirin 81 MG tablet   Oral   Take 81 mg by mouth daily.         . cholecalciferol (VITAMIN D) 1000 UNITS tablet   Oral   Take 1,000 Units by mouth daily.         . mercaptopurine (PURINETHOL) 50 MG tablet   Oral  Take 75 mg by mouth daily. Give on an empty stomach 1 hour before or 2 hours after meals. Caution: Chemotherapy.         . ciprofloxacin (CIPRO) 500 MG tablet   Oral   Take 1 tablet (500 mg total) by mouth 2 (two) times daily. One po bid x 7 days   14 tablet   0   . oxyCODONE-acetaminophen (PERCOCET) 5-325 MG per tablet   Oral   Take 1 tablet by mouth every 4 (four) hours as needed.   20 tablet   0    BP 156/78  Pulse 72  Temp(Src) 98 F (36.7 C) (Oral)  Resp 18  SpO2 100% Physical Exam  Constitutional: She is oriented to person, place, and time. She appears well-developed and well-nourished.  HENT:  Head: Normocephalic and atraumatic.  Mouth/Throat: Oropharynx is clear and moist.   Eyes: Pupils are equal, round, and reactive to light.  Neck: Normal range of motion. Neck supple.  Cardiovascular: Normal rate, regular rhythm and normal heart sounds.   Pulmonary/Chest: Effort normal and breath sounds normal. No respiratory distress. She has no wheezes. She has no rales. She exhibits no tenderness.  Abdominal: Soft. Bowel sounds are normal. There is no tenderness. There is no rebound and no guarding.  Musculoskeletal: Normal range of motion. She exhibits no edema.  No pain along the cervical thoracic or lumbosacral spine. There is positive tenderness on range of motion of the right hip. There is no shortening or deformity noted. Pedal pulses are intact in the right foot. She has normal sensation to light touch in the foot. She has no pain to the right knee or ankle. She has no other pain on palpation or range of motion extremities  Lymphadenopathy:    She has no cervical adenopathy.  Neurological: She is alert and oriented to person, place, and time.  Skin: Skin is warm and dry. No rash noted.  Psychiatric: She has a normal mood and affect.    ED Course  Procedures (including critical care time) Labs Review Results for orders placed during the hospital encounter of 05/16/13  URINALYSIS, ROUTINE W REFLEX MICROSCOPIC      Result Value Range   Color, Urine YELLOW  YELLOW   APPearance CLOUDY (*) CLEAR   Specific Gravity, Urine 1.013  1.005 - 1.030   pH 7.5  5.0 - 8.0   Glucose, UA NEGATIVE  NEGATIVE mg/dL   Hgb urine dipstick NEGATIVE  NEGATIVE   Bilirubin Urine NEGATIVE  NEGATIVE   Ketones, ur NEGATIVE  NEGATIVE mg/dL   Protein, ur NEGATIVE  NEGATIVE mg/dL   Urobilinogen, UA 0.2  0.0 - 1.0 mg/dL   Nitrite POSITIVE (*) NEGATIVE   Leukocytes, UA SMALL (*) NEGATIVE  URINE MICROSCOPIC-ADD ON      Result Value Range   WBC, UA 3-6  <3 WBC/hpf   Bacteria, UA MANY (*) RARE   Urine-Other AMORPHOUS URATES/PHOSPHATES     Dg Hip Complete Right  05/16/2013   CLINICAL DATA:   Fall, right hip pain  EXAM: RIGHT HIP - COMPLETE 2+ VIEW  COMPARISON:  Prior CT abdomen/pelvis 11/02/2009  FINDINGS: The bones are diffusely osteopenic. Irregularity of the right superior pubic ramus and inferior pubic ramus noted. The parasymphyseal inferior pubic ramus likely represents a remote healed fracture as it was present on the prior CT scan from 2011. However, there is a questionable lucency in the parasymphyseal right superior pubic ramus concerning for an acute fracture. The right proximal femur  appears intact. Surgical changes of left hip arthroplasty. No definite hardware complication. Round radiopacity projecting over the anatomic pelvis may reflect a pessary.  IMPRESSION: Suspect acute fracture of the right parasymphyseal superior, and possibly inferior pubic rami superimposed on a remote old fracture. If clinically warranted, CT scan could further evaluate.  The bones are diffusely osteopenic which limits evaluation for nondisplaced fracture.  Left hip arthroplasty without evidence of hardware complication.   Electronically Signed   By: Jacqulynn Cadet M.D.   On: 05/16/2013 13:26     Imaging Review Dg Hip Complete Right  05/16/2013   CLINICAL DATA:  Fall, right hip pain  EXAM: RIGHT HIP - COMPLETE 2+ VIEW  COMPARISON:  Prior CT abdomen/pelvis 11/02/2009  FINDINGS: The bones are diffusely osteopenic. Irregularity of the right superior pubic ramus and inferior pubic ramus noted. The parasymphyseal inferior pubic ramus likely represents a remote healed fracture as it was present on the prior CT scan from 2011. However, there is a questionable lucency in the parasymphyseal right superior pubic ramus concerning for an acute fracture. The right proximal femur appears intact. Surgical changes of left hip arthroplasty. No definite hardware complication. Round radiopacity projecting over the anatomic pelvis may reflect a pessary.  IMPRESSION: Suspect acute fracture of the right parasymphyseal  superior, and possibly inferior pubic rami superimposed on a remote old fracture. If clinically warranted, CT scan could further evaluate.  The bones are diffusely osteopenic which limits evaluation for nondisplaced fracture.  Left hip arthroplasty without evidence of hardware complication.   Electronically Signed   By: Jacqulynn Cadet M.D.   On: 05/16/2013 13:26    EKG Interpretation   None       MDM   1. Pubic ramus fracture, right, closed, initial encounter   2. UTI (lower urinary tract infection)    Patient presents with hip pain. She is able to put weight on the right leg. She has evidence of a pubic ramus fracture. Given that she is able to put weight on the leg, I have a lower suspicion of an occult hip fracture. She does have a walker to use at home. She was given a prescription for Cipro for her UTI as well as Percocet for pain. She will make an appointment to followup with her primary care physician.    Malvin Johns, MD 05/16/13 [phone]

## 2013-05-16 NOTE — ED Notes (Signed)
States that she was giving the cat some water and slipped on some water that was in the floor landing on her bottom. She is complaining of right hip pain. Unable to bear weight on her right leg at present.

## 2013-05-18 LAB — URINE CULTURE: Colony Count: >=100000

## 2013-05-21 ENCOUNTER — Telehealth (HOSPITAL_COMMUNITY): Payer: Self-pay | Admitting: Emergency Medicine

## 2013-05-22 NOTE — ED Notes (Signed)
Unable to contact patient via phone. Sent letter.

## 2013-05-28 ENCOUNTER — Ambulatory Visit: Payer: Medicare Other | Admitting: Internal Medicine

## 2013-05-31 ENCOUNTER — Telehealth: Payer: Self-pay | Admitting: General Practice

## 2013-05-31 NOTE — Telephone Encounter (Signed)
Please review for me -  at this time i am closed to new patients and this patient does not meet criteria for active pt (last OV>23yr) ---

## 2013-05-31 NOTE — Telephone Encounter (Signed)
Pt called the front desk and I informed her to come in the am to leave [place] a urine so that we can send it for urine culture.  Pt stated that she would call her daughter would bring her in tomorrow 06/01/13 for a urine culture.

## 2013-05-31 NOTE — Telephone Encounter (Signed)
Pt called stated that she was in the hospital 12.6.14 for pelvis frature and was told to follow up with PCP asap. Pt has medicare and last ov was 04/2010. Pt request to reestablish and work in for hospital f/u. Please review/advise.

## 2013-05-31 NOTE — Telephone Encounter (Signed)
Patient called and left message stating she needs a prescription sent to her [facility] on [address], please call back. Called patient, no answer- left message that we are returning her phone call, please call us back and let us know what medication you are needing so we can speak with Dr Hulan Fray

## 2013-05-31 NOTE — Telephone Encounter (Signed)
Pt is aware.  

## 2013-06-07 ENCOUNTER — Telehealth: Payer: Self-pay | Admitting: Internal Medicine

## 2013-06-07 NOTE — Telephone Encounter (Signed)
Dr. Carlean Purl who else do you recommend for a PCP.  Dr. Asa Lente is not accepting new patients?

## 2013-06-08 NOTE — Telephone Encounter (Signed)
Ask her If I can talk to her daughter about where is easiest place to go I have a few options in mind

## 2013-06-09 ENCOUNTER — Telehealth: Payer: Self-pay | Admitting: Internal Medicine

## 2013-06-09 ENCOUNTER — Encounter: Payer: Self-pay | Admitting: Internal Medicine

## 2013-06-09 ENCOUNTER — Other Ambulatory Visit: Payer: Self-pay | Admitting: Internal Medicine

## 2013-06-09 DIAGNOSIS — N390 Urinary tract infection, site not specified: Secondary | ICD-10-CM | POA: Insufficient documentation

## 2013-06-09 DIAGNOSIS — B9620 Unspecified Escherichia coli [E. coli] as the cause of diseases classified elsewhere: Secondary | ICD-10-CM

## 2013-06-09 HISTORY — DX: Urinary tract infection, site not specified: B96.20

## 2013-06-09 HISTORY — DX: Urinary tract infection, site not specified: N39.0

## 2013-06-09 MED ORDER — AMPICILLIN 500 MG PO CAPS: 500.0000 mg | ORAL_CAPSULE | Freq: Four times a day (QID) | ORAL | Status: AC

## 2013-06-09 NOTE — Assessment & Plan Note (Signed)
Persistent sxs, cipro Rx but resistant Will Tx ampicillin as most sensitive

## 2013-06-09 NOTE — Telephone Encounter (Signed)
Spoke to daughter and I will help find PCP Ms. Seddon needs to see me in second half of Jan please

## 2013-06-09 NOTE — Telephone Encounter (Signed)
Patient is already scheduled for January follow up

## 2013-06-09 NOTE — Telephone Encounter (Signed)
I spoke with the patient, she said it was ok to speak with her daughter.

## 2013-06-09 NOTE — Telephone Encounter (Signed)
UTI dx at ED Cipro Rx Patient still w/ sxs  Review shows E coli which was resistant to quinolones, very sensitive to ampicillin, will Rx

## 2013-06-18 ENCOUNTER — Ambulatory Visit: Payer: Medicare Other | Admitting: Obstetrics & Gynecology

## 2013-06-25 ENCOUNTER — Encounter: Payer: Self-pay | Admitting: Obstetrics & Gynecology

## 2013-06-25 ENCOUNTER — Ambulatory Visit (INDEPENDENT_AMBULATORY_CARE_PROVIDER_SITE_OTHER): Payer: Medicare Other | Admitting: Obstetrics & Gynecology

## 2013-06-25 VITALS — BP 140/80 | HR 69 | Temp 97.5°F

## 2013-06-25 DIAGNOSIS — Z4689 Encounter for fitting and adjustment of other specified devices: Secondary | ICD-10-CM | POA: Diagnosis not present

## 2013-06-25 NOTE — Progress Notes (Signed)
Here for pessary check. C/o " being wobbly " today. Using cane,. [place] December 6th and went to [employer] [facility].

## 2013-06-25 NOTE — Progress Notes (Signed)
   Subjective:    Patient ID: [name], female    DOB: 20-Feb-1933, 80 y.o.   MRN: [id number]  HPI She is here for a pessary removal and cleaning. She is using a cane today as she is becoming weaker as she ages. She tells me that her grown children are trying to "put me in a home".   Review of Systems     Objective:   Physical Exam I removed and cleaned her pessary. I inspected her vaginal vault with a speculum and found it to be healthy.       Assessment & Plan:  I spoke at length with her about the importance of getting her pessary cleaned and her vagina inslpected at the office every other month to help prevent damage to bowel and bladder.

## 2013-07-02 ENCOUNTER — Ambulatory Visit (INDEPENDENT_AMBULATORY_CARE_PROVIDER_SITE_OTHER): Payer: Medicare Other

## 2013-07-02 ENCOUNTER — Ambulatory Visit (INDEPENDENT_AMBULATORY_CARE_PROVIDER_SITE_OTHER): Payer: Medicare Other | Admitting: Internal Medicine

## 2013-07-02 ENCOUNTER — Encounter: Payer: Self-pay | Admitting: Internal Medicine

## 2013-07-02 VITALS — BP 140/72 | HR 64 | Ht 61.0 in | Wt 112.5 lb

## 2013-07-02 DIAGNOSIS — E210 Primary hyperparathyroidism: Secondary | ICD-10-CM

## 2013-07-02 DIAGNOSIS — K5000 Crohn's disease of small intestine without complications: Secondary | ICD-10-CM | POA: Diagnosis not present

## 2013-07-02 DIAGNOSIS — IMO0002 Reserved for concepts with insufficient information to code with codable children: Secondary | ICD-10-CM

## 2013-07-02 DIAGNOSIS — Z79899 Other long term (current) drug therapy: Secondary | ICD-10-CM

## 2013-07-02 DIAGNOSIS — E559 Vitamin D deficiency, unspecified: Secondary | ICD-10-CM | POA: Diagnosis not present

## 2013-07-02 DIAGNOSIS — Z7960 Long term (current) use of unspecified immunomodulators and immunosuppressants: Secondary | ICD-10-CM

## 2013-07-02 DIAGNOSIS — S3282XA Multiple fractures of pelvis without disruption of pelvic ring, initial encounter for closed fracture: Secondary | ICD-10-CM

## 2013-07-02 HISTORY — DX: Multiple fractures of pelvis without disruption of pelvic ring, initial encounter for closed fracture: S32.82XA

## 2013-07-02 LAB — CBC WITH DIFFERENTIAL/PLATELET
BASOS ABS: 0 10*3/uL (ref 0.0–0.1)
Basophils Relative: 0.8 % (ref 0.0–3.0)
EOS PCT: 2.4 % (ref 0.0–5.0)
Eosinophils Absolute: 0.1 10*3/uL (ref 0.0–0.7)
HCT: 34.7 % — ABNORMAL LOW (ref 36.0–46.0)
Hemoglobin: 11.8 g/dL — ABNORMAL LOW (ref 12.0–15.0)
Lymphocytes Relative: 21.2 % (ref 12.0–46.0)
Lymphs Abs: 0.5 10*3/uL — ABNORMAL LOW (ref 0.7–4.0)
MCHC: 33.9 g/dL (ref 30.0–36.0)
MCV: 105.8 fl — AB (ref 78.0–100.0)
MONO ABS: 0.2 10*3/uL (ref 0.1–1.0)
Monocytes Relative: 6.5 % (ref 3.0–12.0)
NEUTROS PCT: 69.1 % (ref 43.0–77.0)
Neutro Abs: 1.8 10*3/uL (ref 1.4–7.7)
PLATELETS: 188 10*3/uL (ref 150.0–400.0)
RBC: 3.28 Mil/uL — ABNORMAL LOW (ref 3.87–5.11)
RDW: 18.6 % — AB (ref 11.5–14.6)
WBC: 5.3 10*3/uL (ref 4.5–10.5)

## 2013-07-02 LAB — COMPREHENSIVE METABOLIC PANEL
ALBUMIN: 3.8 g/dL (ref 3.5–5.2)
ALT: 20 U/L (ref 0–35)
AST: 21 U/L (ref 0–37)
Alkaline Phosphatase: 91 U/L (ref 39–117)
BUN: 15 mg/dL (ref 6–23)
CO2: 28 meq/L (ref 19–32)
Calcium: 10.6 mg/dL — ABNORMAL HIGH (ref 8.4–10.5)
Chloride: 109 mEq/L (ref 96–112)
Creatinine, Ser: 0.8 mg/dL (ref 0.4–1.2)
GFR: 69.68 mL/min (ref >60.00)
Glucose, Bld: 106 mg/dL — ABNORMAL HIGH (ref 70–99)
POTASSIUM: 4.4 meq/L (ref 3.5–5.1)
SODIUM: 140 meq/L (ref 135–145)
TOTAL PROTEIN: 6.7 g/dL (ref 6.0–8.3)
Total Bilirubin: 1.3 mg/dL — ABNORMAL HIGH (ref 0.3–1.2)

## 2013-07-02 LAB — VITAMIN B12: VITAMIN B 12: 417 pg/mL (ref 211–911)

## 2013-07-02 NOTE — Assessment & Plan Note (Signed)
Took high dose supplement - recheck level

## 2013-07-02 NOTE — Patient Instructions (Signed)
Today you are having labs drawn and we will be in touch with results.   I appreciate the opportunity to care for you.

## 2013-07-02 NOTE — Progress Notes (Signed)
         Subjective:    Patient ID: [name], female    DOB: Dec 31, 1932, 80 y.o.   MRN: [id number]  HPI  Patient is here for followup of her Crohn's disease and I have also evaluated her vitamin D status. She is a history of hyperparathyroidism has had low vitamin D as well so it's been a little bit confusing. In the interim she fell and is recovering from pelvic fx's. She had an Escherichia coli UTI, Cipro was prescribed in the emergency department but was not sensitive to that so I prescribed ampicillin and symptoms have resolved. No bowel sxs or abdominal pain She reports that her son's girlfriend saying she has dementia - there is no diagnosis of that I am aware of Has appt w/ new PCP for March. The patient and her daughter had requested that I help arrange for a PCP visit but I have not yet done so was waiting to speak to the patient. Her son obtained this appointment. Medications, allergies, past medical history, past surgical history, family history and social history are reviewed and updated in the EMR.   Review of Systems As above    Objective:   Physical Exam  WDWNNAD    Assessment & Plan:   1. CROHN'S DISEASE, SMALL INTESTINE   2. Primary hyperparathyroidism   3. VITAMIN D DEFICIENCY   4. Long-term use of immunosuppressant medication   5. Multiple pelvic fractures

## 2013-07-02 NOTE — Assessment & Plan Note (Signed)
Recheck labs and vit D

## 2013-07-02 NOTE — Assessment & Plan Note (Signed)
CBC and LFT's

## 2013-07-02 NOTE — Assessment & Plan Note (Addendum)
Stable on 6 MP Recheck B12 Continue 6 MP

## 2013-07-03 LAB — VITAMIN D 25 HYDROXY (VIT D DEFICIENCY, FRACTURES): VIT D 25 HYDROXY: 29 ng/mL — AB (ref 30–89)

## 2013-07-05 ENCOUNTER — Telehealth: Payer: Self-pay | Admitting: Internal Medicine

## 2013-07-06 NOTE — Telephone Encounter (Signed)
Patient and daughter [name] notified

## 2013-07-06 NOTE — Telephone Encounter (Signed)
Patient calling for lab.  Please advise what I need to tell her

## 2013-07-06 NOTE — Telephone Encounter (Signed)
Vitamin D level much better and almost normal B12 normal CBC slight anemia - stable Chemistries ok but calcium slightly high  I am still waiting on the parathyroid hormone test results Please tell her to take 2000 IU daily of vitamin D   Tell her I am going to call her daughter [name] also as discussed

## 2013-07-13 ENCOUNTER — Telehealth: Payer: Self-pay

## 2013-07-13 NOTE — Telephone Encounter (Signed)
Patient notified.  She said that she will notify [name]

## 2013-07-13 NOTE — Telephone Encounter (Signed)
Message copied by Marlon Pel on Tue Jul 13, 2013  9:45 AM ------      Message from: Gatha Mayer      Created: Tue Jul 13, 2013  8:47 AM      Regarding: Appointment with Dr. Damita Dunnings       Please call patient and her daughter [name] and let them know Dr. Josefine Class office ([place]) will be calling her to arrange a new patient appointment.            This is the MD I am recommending to be her new PCP.            She will need to cancel appt w/ Hollace Kinnier (her son made that)            CEG ------

## 2013-07-22 ENCOUNTER — Ambulatory Visit (INDEPENDENT_AMBULATORY_CARE_PROVIDER_SITE_OTHER): Payer: Medicare Other | Admitting: Family Medicine

## 2013-07-22 ENCOUNTER — Encounter: Payer: Self-pay | Admitting: Family Medicine

## 2013-07-22 VITALS — BP 142/80 | HR 64 | Temp 98.2°F | Ht 61.0 in | Wt 111.8 lb

## 2013-07-22 DIAGNOSIS — E210 Primary hyperparathyroidism: Secondary | ICD-10-CM | POA: Diagnosis not present

## 2013-07-22 DIAGNOSIS — M810 Age-related osteoporosis without current pathological fracture: Secondary | ICD-10-CM | POA: Diagnosis not present

## 2013-07-22 DIAGNOSIS — E213 Hyperparathyroidism, unspecified: Secondary | ICD-10-CM

## 2013-07-22 NOTE — Progress Notes (Signed)
Pre-visit discussion using our clinic review tool. No additional management support is needed unless otherwise documented below in the visit note.  Osteoporosis.  Due for PTH.  No pain now from recent fx but gait is still altered.  Using a cane.  H/o pelvic fx noted.  Discussed with pt about possible DXA.  Not on tx prev per patient. I'll review old records.   Crohn's per GI.  Controlled with current meds.    She is unsure about past dates for routine health maintenance items.   Meds, vitals, and allergies reviewed.   ROS: See HPI.  Otherwise, noncontributory.  GEN: nad, alert and oriented, thin elderly woman HEENT: mucous membranes moist NECK: supple w/o LA CV: rrr PULM: ctab, no inc wob ABD: soft, +bs EXT: no edema SKIN: no acute rash Walking slowly with 4 point cane.

## 2013-07-22 NOTE — Patient Instructions (Signed)
Go to the lab on the way out.  We'll contact you with your lab report. We may need to get a bone density test in the near future.

## 2013-07-23 LAB — PTH, INTACT AND CALCIUM
Calcium: 11 mg/dL — ABNORMAL HIGH (ref 8.4–10.5)
PTH: 297.7 pg/mL — ABNORMAL HIGH (ref 14.0–72.0)

## 2013-07-23 NOTE — Assessment & Plan Note (Signed)
See notes on labs.  D/w pt about possible DXA in near future. Will review available old records.

## 2013-07-23 NOTE — Assessment & Plan Note (Signed)
See notes on labs.  >25 minutes spent in face to face time with patient, >50% spent in counselling or coordination of care.  

## 2013-07-28 ENCOUNTER — Other Ambulatory Visit: Payer: Self-pay | Admitting: Family Medicine

## 2013-07-28 DIAGNOSIS — E213 Hyperparathyroidism, unspecified: Secondary | ICD-10-CM

## 2013-07-30 ENCOUNTER — Other Ambulatory Visit (INDEPENDENT_AMBULATORY_CARE_PROVIDER_SITE_OTHER): Payer: Medicare Other

## 2013-07-30 DIAGNOSIS — E213 Hyperparathyroidism, unspecified: Secondary | ICD-10-CM

## 2013-08-03 ENCOUNTER — Telehealth: Payer: Self-pay | Admitting: *Deleted

## 2013-08-03 NOTE — Telephone Encounter (Signed)
Rosaria Ferries called the patient this morning to schedule her referrals because of resent lab results. Rosaria Ferries asked that I call the patient again and explain results so that she understood things better. Called patient again and explained to her the lab results. Patient seemed to understand and asked that I call [name] at [phone] or [phone]. Called [name] and went over lab results along with Dr. Josefine Class plans. [name] understood and stated that she will talk further with her mom about this. [name] transferred to [facility] to get referrals scheduled.

## 2013-08-04 ENCOUNTER — Ambulatory Visit: Payer: Medicare Other | Admitting: Obstetrics & Gynecology

## 2013-08-04 DIAGNOSIS — E213 Hyperparathyroidism, unspecified: Secondary | ICD-10-CM | POA: Diagnosis not present

## 2013-08-04 NOTE — Addendum Note (Signed)
Addended by: Ellamae Sia on: 08/04/2013 05:34 PM   Modules accepted: Orders

## 2013-08-06 LAB — CALCIUM, URINE, 24 HOUR
CALCIUM UR: 18 mg/dL
Calcium, 24 hour urine: 252 mg/d — ABNORMAL HIGH (ref 100–250)

## 2013-08-09 ENCOUNTER — Encounter: Payer: Self-pay | Admitting: Obstetrics & Gynecology

## 2013-08-09 ENCOUNTER — Ambulatory Visit: Payer: Self-pay | Admitting: Internal Medicine

## 2013-08-09 ENCOUNTER — Ambulatory Visit (INDEPENDENT_AMBULATORY_CARE_PROVIDER_SITE_OTHER): Payer: Medicare Other | Admitting: Obstetrics & Gynecology

## 2013-08-09 VITALS — BP 146/78 | HR 65 | Temp 97.0°F | Wt 113.5 lb

## 2013-08-09 DIAGNOSIS — Z4689 Encounter for fitting and adjustment of other specified devices: Secondary | ICD-10-CM

## 2013-08-09 DIAGNOSIS — N814 Uterovaginal prolapse, unspecified: Secondary | ICD-10-CM | POA: Diagnosis not present

## 2013-08-09 NOTE — Progress Notes (Signed)
   Subjective:    Patient ID: [name], female    DOB: [birth year], 80 y.o.   MRN: [id number]  HPI  [name] is here today for her pessary cleaning. She has no complaints. She has used the vagifem tablet once in the last 6 months.   Review of Systems     Objective:   Physical Exam  Her vaginal mucosa appears healthy with no excoriations. I removed and cleaned and replaced her ring pessary      Assessment & Plan:  Prolapse- continue pessary and cleanings every 2 months.

## 2013-08-11 ENCOUNTER — Encounter (HOSPITAL_COMMUNITY): Payer: Medicare Other

## 2013-08-11 ENCOUNTER — Encounter (HOSPITAL_COMMUNITY)
Admission: RE | Admit: 2013-08-11 | Discharge: 2013-08-11 | Disposition: A | Discharge status: Home / Self Care | Payer: Medicare Other | Source: Ambulatory Visit | Attending: Family Medicine | Admitting: Family Medicine

## 2013-08-11 ENCOUNTER — Encounter (HOSPITAL_COMMUNITY): Admission: RE | Admit: 2013-08-11 | Payer: Medicare Other | Source: Ambulatory Visit

## 2013-08-11 DIAGNOSIS — E213 Hyperparathyroidism, unspecified: Secondary | ICD-10-CM

## 2013-08-11 DIAGNOSIS — E079 Disorder of thyroid, unspecified: Secondary | ICD-10-CM | POA: Diagnosis not present

## 2013-08-11 MED ORDER — TECHNETIUM TC 99M SESTAMIBI GENERIC - CARDIOLITE
25.0000 | Freq: Once | INTRAVENOUS | Status: AC | PRN
  Administered 2013-08-11: 25 via INTRAVENOUS

## 2013-08-24 ENCOUNTER — Encounter (INDEPENDENT_AMBULATORY_CARE_PROVIDER_SITE_OTHER): Payer: Self-pay | Admitting: Surgery

## 2013-08-24 ENCOUNTER — Ambulatory Visit (INDEPENDENT_AMBULATORY_CARE_PROVIDER_SITE_OTHER): Payer: Medicare Other | Admitting: Surgery

## 2013-08-24 VITALS — BP 136/72 | HR 72 | Temp 99.0°F | Resp 14 | Ht 61.0 in | Wt 112.0 lb

## 2013-08-24 DIAGNOSIS — E210 Primary hyperparathyroidism: Secondary | ICD-10-CM

## 2013-08-24 NOTE — Progress Notes (Signed)
General Surgery [facility]  Chief Complaint  Patient presents with  . New Evaluation    primary hyperparathyroidism - referral from Dr. Elveria Rising. Damita Dunnings    HISTORY: Patient is a 80 year old female referred by her primary care physician for evaluation of primary hyperparathyroidism. Patient was noted on routine laboratory studies to have an elevated calcium level ranging from 10.6-11.0 over the past 2 years. Most recent studies from February 2015 show an elevated calcium level of 11.0 and an elevated intact PTH level of 297.7. Patient underwent 24-hour urine collection for calcium which was elevated at 252. Vitamin D level was nearly normal at 29. Patient was sent for nuclear medicine parathyroid scan performed on 08/11/2013. This localized an area of uptake to the left inferior position consistent with parathyroid adenoma.  Patient has no prior history of parathyroid disease. She has had no prior head or neck surgery. There is no family history of parathyroid disease and no family history of endocrine neoplasms.  Past Medical History  Diagnosis Date  . Crohn's disease   . Bladder prolapse, female, acquired     pessary in  . Osteoporosis   . Pubic ramus fracture   . Hyperparathyroidism   . Vitamin D deficiency   . History of hypercalcemia   . History of hyperglycemia   . Hydronephrosis   . Urinary incontinence 09/09/2011  . Complete uterine prolapse   . E. coli UTI 06/09/2013  . Fall 05/2013  . Multiple pelvic fractures 07/02/2013    Current Outpatient Prescriptions  Medication Sig Dispense Refill  . aspirin 81 MG tablet Take 81 mg by mouth daily.      . cholecalciferol (VITAMIN D) 1000 UNITS tablet Take 1,000 Units by mouth daily.      . mercaptopurine (PURINETHOL) 50 MG tablet Take 75 mg by mouth daily. Give on an empty stomach 1 hour before or 2 hours after meals. Caution: Chemotherapy.       No current facility-administered medications for this visit.     No Known Allergies  Family History  Problem Relation Age of Onset  . Heart disease Father   . Colon cancer Father   . Osteoporosis Mother   . Breast cancer Mother   . Prostate cancer Brother     History   Social History  . Marital Status: Widowed    Spouse Name: N/A    Number of Children: 3  . Years of Education: N/A   Occupational History  . Retired    Social History Main Topics  . Smoking status: Never Smoker   . Smokeless tobacco: Never Used  . Alcohol Use: No     Comment: wine  . Drug Use: No  . Sexual Activity: No     Comment: because of prolapse and pessary   Other Topics Concern  . None   Social History Narrative   Widowed.  Lives with a housemate.     3 kids.     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    REVIEW OF SYSTEMS - PERTINENT POSITIVES ONLY: History of osteoporosis. Chronic fatigue. Bone and joint pain. Denies nephrolithiasis.  EXAM: Filed Vitals:   08/24/13 1426  BP: 136/72  Pulse: 72  Temp: 99 F (37.2 C)  Resp: 14    GENERAL: well-developed, well-nourished, no acute distress HEENT: normocephalic; pupils equal and reactive; sclerae clear; dentition good; mucous membranes moist NECK:  No palpable masses  in the thyroid bed; symmetric on extension; no palpable anterior or posterior cervical lymphadenopathy; no supraclavicular masses; no tenderness CHEST: clear to auscultation bilaterally without rales, rhonchi, or wheezes CARDIAC: regular rate and rhythm without significant murmur; peripheral pulses are full EXT:  non-tender without edema; no deformity NEURO: no gross focal deficits; no sign of tremor   LABORATORY RESULTS: See [facility] ([facility]Epic) for most recent results  RADIOLOGY RESULTS: See [facility] ([facility]-Epic) for most recent results  IMPRESSION: #1 primary hyperparathyroidism, likely left inferior parathyroid adenoma #2 osteoporosis  PLAN: I  discussed the above findings at length with the patient and her son, who is a Animal nutritionist. We discussed her test results. I provided him with written literature on parathyroid disease and its surgical management to review at home.  I have recommended minimally invasive parathyroidectomy as an outpatient surgical procedure. We discussed the risk and benefits of the procedure including the possibility of recurrent laryngeal nerve injury and the possibility of a second gland adenoma. We discussed options for management including 4 gland exploration. They would like to proceed with minimally invasive parathyroidectomy. We will make arrangements for surgery at a time convenient for the patient in the near future.  The risks and benefits of the procedure have been discussed at length with the patient.  The patient understands the proposed procedure, potential alternative treatments, and the course of recovery to be expected.  All of the patient's questions have been answered at this time.  The patient wishes to proceed with surgery.  Earnstine Regal, MD, [facility]  Primary Care Physician: Elsie Stain, MD

## 2013-08-24 NOTE — Patient Instructions (Signed)

## 2013-08-25 ENCOUNTER — Encounter (HOSPITAL_COMMUNITY): Payer: Self-pay

## 2013-08-25 ENCOUNTER — Encounter (HOSPITAL_COMMUNITY): Payer: Self-pay | Admitting: Pharmacy Technician

## 2013-08-25 ENCOUNTER — Encounter (HOSPITAL_COMMUNITY)
Admission: RE | Admit: 2013-08-25 | Discharge: 2013-08-25 | Disposition: A | Discharge status: Home / Self Care | Payer: Medicare Other | Source: Ambulatory Visit | Attending: Surgery | Admitting: Surgery

## 2013-08-25 DIAGNOSIS — Z01812 Encounter for preprocedural laboratory examination: Secondary | ICD-10-CM | POA: Diagnosis not present

## 2013-08-25 LAB — CBC
HCT: 33.6 % — ABNORMAL LOW (ref 36.0–46.0)
Hemoglobin: 11.9 g/dL — ABNORMAL LOW (ref 12.0–15.0)
MCH: 37.3 pg — AB (ref 26.0–34.0)
MCHC: 35.4 g/dL (ref 30.0–36.0)
MCV: 105.3 fL — ABNORMAL HIGH (ref 78.0–100.0)
Platelets: 219 10*3/uL (ref 150–400)
RBC: 3.19 MIL/uL — AB (ref 3.87–5.11)
RDW: 14.6 % (ref 11.5–15.5)
WBC: 2.3 10*3/uL — ABNORMAL LOW (ref 4.0–10.5)

## 2013-08-25 LAB — BASIC METABOLIC PANEL
BUN: 16 mg/dL (ref 6–23)
CALCIUM: 11.6 mg/dL — AB (ref 8.4–10.5)
CO2: 25 mEq/L (ref 19–32)
CREATININE: 0.71 mg/dL (ref 0.50–1.10)
Chloride: 104 mEq/L (ref 96–112)
GFR calc Af Amer: >90 mL/min (ref >90)
GFR, EST NON AFRICAN AMERICAN: 80 mL/min — AB (ref >90)
GLUCOSE: 92 mg/dL (ref 70–99)
Potassium: 4.7 mEq/L (ref 3.7–5.3)
SODIUM: 139 meq/L (ref 137–147)

## 2013-08-25 NOTE — Progress Notes (Signed)
Quick Note:  These results are acceptable for scheduled surgery.  Anastyn Ayars M. Traeton Bordas, MD, FACS [facility] Office: [phone]   ______ 

## 2013-08-25 NOTE — Patient Instructions (Addendum)
20 [name]  08/25/2013   Your procedure is scheduled on: 3-23  -2015  Report to [place] at      44   PM.  Call this number if you have problems the morning of surgery: [phone]  Or Presurgical Testing [phone]([place]armonii Karle) For Living Will and/or Health Care Power Attorney Forms: please provide copy for your medical record,may bring AM of surgery(Forms should be already notarized -we do not provide this service).(08-25-13 Information given to patient).      Do not eat food:After Midnight.  May have clear liquids:up to 6 Hours before arrival. Nothing after : 0900 AM  Clear liquids include soda, tea, black coffee, apple or grape juice, broth.  Take these medicines the morning of surgery with A SIP OF WATER: none   Do not wear jewelry, make-up or nail polish.  Do not wear lotions, powders, or perfumes. You may wear deodorant.  Do not shave 48 hours(2 days) prior to first CHG shower(legs and under arms).(Shaving face and neck okay.)  Do not bring valuables to the hospital.(Hospital is not responsible for lost valuables).  Contacts, dentures or removable bridgework, body piercing, hair pins may not be worn into surgery.  Leave suitcase in the car. After surgery it may be brought to your room.  For patients admitted to the hospital, checkout time is 11:00 AM the day of discharge.(Restricted visitors-Any Persons displaying flu-like symptoms or illness).    Patients discharged the day of surgery will not be allowed to drive home. Must have responsible person with you x 24 hours once discharged.  Name and phone number of your driver: Caitlyn [phone]   Special Instructions: CHG(Chlorhedine 4%-"Hibiclens","Betasept","Aplicare") Shower Use Special Wash: see special instructions.(avoid face and genitals)       Failure to follow these instructions may result in Cancellation of your surgery.   Patient  signature_______________________________________________________

## 2013-08-25 NOTE — Pre-Procedure Instructions (Signed)
08-25-13 Note to Dr. Gala Lewandowsky in basket -labs viewable in epic.

## 2013-08-25 NOTE — Progress Notes (Signed)
08-25-13 Labs viewable in Epic-CBC,BMP.

## 2013-08-30 ENCOUNTER — Ambulatory Visit (HOSPITAL_COMMUNITY)
Admission: RE | Admit: 2013-08-30 | Discharge: 2013-08-30 | Disposition: A | Discharge status: Home / Self Care | Payer: Medicare Other | Source: Ambulatory Visit | Attending: Surgery | Admitting: Surgery

## 2013-08-30 ENCOUNTER — Encounter (HOSPITAL_COMMUNITY)
Admission: RE | Disposition: A | Discharge status: Home / Self Care | Payer: Self-pay | Source: Ambulatory Visit | Attending: Surgery

## 2013-08-30 ENCOUNTER — Encounter (HOSPITAL_COMMUNITY): Payer: Self-pay | Admitting: *Deleted

## 2013-08-30 ENCOUNTER — Encounter (HOSPITAL_COMMUNITY): Payer: Medicare Other | Admitting: Anesthesiology

## 2013-08-30 ENCOUNTER — Ambulatory Visit (HOSPITAL_COMMUNITY): Payer: Medicare Other | Admitting: Anesthesiology

## 2013-08-30 DIAGNOSIS — Z79899 Other long term (current) drug therapy: Secondary | ICD-10-CM | POA: Insufficient documentation

## 2013-08-30 DIAGNOSIS — D351 Benign neoplasm of parathyroid gland: Secondary | ICD-10-CM | POA: Diagnosis not present

## 2013-08-30 DIAGNOSIS — N8111 Cystocele, midline: Secondary | ICD-10-CM | POA: Insufficient documentation

## 2013-08-30 DIAGNOSIS — E559 Vitamin D deficiency, unspecified: Secondary | ICD-10-CM | POA: Insufficient documentation

## 2013-08-30 DIAGNOSIS — E210 Primary hyperparathyroidism: Secondary | ICD-10-CM | POA: Diagnosis not present

## 2013-08-30 DIAGNOSIS — M810 Age-related osteoporosis without current pathological fracture: Secondary | ICD-10-CM | POA: Insufficient documentation

## 2013-08-30 DIAGNOSIS — E213 Hyperparathyroidism, unspecified: Secondary | ICD-10-CM | POA: Diagnosis not present

## 2013-08-30 DIAGNOSIS — Z7982 Long term (current) use of aspirin: Secondary | ICD-10-CM | POA: Diagnosis not present

## 2013-08-30 DIAGNOSIS — K5090 Crohn's disease, unspecified, without complications: Secondary | ICD-10-CM | POA: Diagnosis not present

## 2013-08-30 HISTORY — PX: PARATHYROIDECTOMY: SHX19

## 2013-08-30 SURGERY — PARATHYROIDECTOMY
Anesthesia: General | Laterality: Left

## 2013-08-30 MED ORDER — BUPIVACAINE HCL (PF) 0.25 % IJ SOLN
INTRAMUSCULAR | Status: AC
  Filled 2013-08-30: qty 30

## 2013-08-30 MED ORDER — PROPOFOL 10 MG/ML IV BOLUS
INTRAVENOUS | Status: DC | PRN
  Administered 2013-08-30: 110 mg via INTRAVENOUS

## 2013-08-30 MED ORDER — NEOSTIGMINE METHYLSULFATE 1 MG/ML IJ SOLN
INTRAMUSCULAR | Status: DC | PRN
  Administered 2013-08-30: 2 mg via INTRAVENOUS

## 2013-08-30 MED ORDER — DEXAMETHASONE SODIUM PHOSPHATE 10 MG/ML IJ SOLN
INTRAMUSCULAR | Status: AC
  Filled 2013-08-30: qty 1

## 2013-08-30 MED ORDER — GLYCOPYRROLATE 0.2 MG/ML IJ SOLN
INTRAMUSCULAR | Status: AC
  Filled 2013-08-30: qty 1

## 2013-08-30 MED ORDER — FENTANYL CITRATE 0.05 MG/ML IJ SOLN
INTRAMUSCULAR | Status: DC | PRN
  Administered 2013-08-30 (×2): 50 ug via INTRAVENOUS

## 2013-08-30 MED ORDER — GLYCOPYRROLATE 0.2 MG/ML IJ SOLN
INTRAMUSCULAR | Status: DC | PRN
  Administered 2013-08-30: .2 mg via INTRAVENOUS

## 2013-08-30 MED ORDER — ONDANSETRON HCL 4 MG/2ML IJ SOLN
INTRAMUSCULAR | Status: AC
  Filled 2013-08-30: qty 2

## 2013-08-30 MED ORDER — LACTATED RINGERS IV SOLN
INTRAVENOUS | Status: DC
  Administered 2013-08-30: 1000 mL via INTRAVENOUS
  Administered 2013-08-30: 17:00:00 via INTRAVENOUS

## 2013-08-30 MED ORDER — DEXAMETHASONE SODIUM PHOSPHATE 10 MG/ML IJ SOLN
INTRAMUSCULAR | Status: DC | PRN
  Administered 2013-08-30: 6 mg via INTRAVENOUS

## 2013-08-30 MED ORDER — SUCCINYLCHOLINE CHLORIDE 20 MG/ML IJ SOLN
INTRAMUSCULAR | Status: DC | PRN
  Administered 2013-08-30: 50 mg via INTRAVENOUS

## 2013-08-30 MED ORDER — PROPOFOL 10 MG/ML IV BOLUS
INTRAVENOUS | Status: AC
  Filled 2013-08-30: qty 20

## 2013-08-30 MED ORDER — HYDROCODONE-ACETAMINOPHEN 5-325 MG PO TABS
1.0000 | ORAL_TABLET | Freq: Once | ORAL | Status: AC
  Administered 2013-08-30: 1 via ORAL
  Filled 2013-08-30: qty 1

## 2013-08-30 MED ORDER — CISATRACURIUM BESYLATE 20 MG/10ML IV SOLN
INTRAVENOUS | Status: AC
  Filled 2013-08-30: qty 10

## 2013-08-30 MED ORDER — ONDANSETRON HCL 4 MG/2ML IJ SOLN
INTRAMUSCULAR | Status: DC | PRN
  Administered 2013-08-30: 4 mg via INTRAVENOUS

## 2013-08-30 MED ORDER — CEFAZOLIN SODIUM-DEXTROSE 2-3 GM-% IV SOLR
2.0000 g | INTRAVENOUS | Status: AC
  Administered 2013-08-30: 2 g via INTRAVENOUS

## 2013-08-30 MED ORDER — LACTATED RINGERS IV SOLN: INTRAVENOUS | Status: DC

## 2013-08-30 MED ORDER — 0.9 % SODIUM CHLORIDE (POUR BTL) OPTIME
TOPICAL | Status: DC | PRN
  Administered 2013-08-30: 1000 mL

## 2013-08-30 MED ORDER — BUPIVACAINE HCL 0.25 % IJ SOLN
INTRAMUSCULAR | Status: DC | PRN
  Administered 2013-08-30: 10 mL

## 2013-08-30 MED ORDER — CEFAZOLIN SODIUM-DEXTROSE 2-3 GM-% IV SOLR
INTRAVENOUS | Status: AC
  Filled 2013-08-30: qty 50

## 2013-08-30 MED ORDER — FENTANYL CITRATE 0.05 MG/ML IJ SOLN
INTRAMUSCULAR | Status: AC
Start: 2013-08-30 — End: 2013-08-30
  Filled 2013-08-30: qty 5

## 2013-08-30 MED ORDER — CISATRACURIUM BESYLATE (PF) 10 MG/5ML IV SOLN
INTRAVENOUS | Status: DC | PRN
  Administered 2013-08-30: 2 mg via INTRAVENOUS

## 2013-08-30 MED ORDER — FENTANYL CITRATE 0.05 MG/ML IJ SOLN: 25.0000 ug | INTRAMUSCULAR | Status: DC | PRN

## 2013-08-30 MED ORDER — PROMETHAZINE HCL 25 MG/ML IJ SOLN: 6.2500 mg | INTRAMUSCULAR | Status: DC | PRN

## 2013-08-30 MED ORDER — HYDROCODONE-ACETAMINOPHEN 5-325 MG PO TABS: 1.0000 | ORAL_TABLET | ORAL | Status: DC | PRN

## 2013-08-30 SURGICAL SUPPLY — 39 items
APL SKNCLS STERI-STRIP NONHPOA (GAUZE/BANDAGES/DRESSINGS) ×1
ATTRACTOMAT 16X20 MAGNETIC DRP (DRAPES) ×2 IMPLANT
BENZOIN TINCTURE PRP APPL 2/3 (GAUZE/BANDAGES/DRESSINGS) ×2 IMPLANT
BLADE HEX COATED 2.75 (ELECTRODE) ×2 IMPLANT
BLADE SURG 15 STRL LF DISP TIS (BLADE) ×1 IMPLANT
BLADE SURG 15 STRL SS (BLADE) ×2
CANISTER SUCTION 2500CC (MISCELLANEOUS) ×1 IMPLANT
CHLORAPREP W/TINT 10.5 ML (MISCELLANEOUS) ×2 IMPLANT
CLIP TI MEDIUM 6 (CLIP) ×4 IMPLANT
CLIP TI WIDE RED SMALL 6 (CLIP) ×6 IMPLANT
DRAPE PED LAPAROTOMY (DRAPES) ×2 IMPLANT
DRESSING SURGICEL FIBRLLR 1X2 (HEMOSTASIS) ×1 IMPLANT
DRSG SURGICEL FIBRILLAR 1X2 (HEMOSTASIS) ×2
ELECT REM PT RETURN 9FT ADLT (ELECTROSURGICAL) ×2
ELECTRODE REM PT RTRN 9FT ADLT (ELECTROSURGICAL) ×1 IMPLANT
GAUZE SPONGE 4X4 16PLY XRAY LF (GAUZE/BANDAGES/DRESSINGS) ×2 IMPLANT
GLOVE SURG ORTHO 8.0 STRL STRW (GLOVE) ×2 IMPLANT
GOWN STRL REUS W/TWL XL LVL3 (GOWN DISPOSABLE) ×4 IMPLANT
KIT BASIN OR (CUSTOM PROCEDURE TRAY) ×2 IMPLANT
MANIFOLD NEPTUNE II (INSTRUMENTS) ×2 IMPLANT
NEEDLE HYPO 25X1 1.5 SAFETY (NEEDLE) ×2 IMPLANT
NS IRRIG 1000ML POUR BTL (IV SOLUTION) ×2 IMPLANT
PACK BASIC VI WITH GOWN DISP (CUSTOM PROCEDURE TRAY) ×2 IMPLANT
PENCIL BUTTON HOLSTER BLD 10FT (ELECTRODE) ×2 IMPLANT
SPONGE GAUZE 4X4 12PLY (GAUZE/BANDAGES/DRESSINGS) ×1 IMPLANT
STAPLER VISISTAT 35W (STAPLE) IMPLANT
STRIP CLOSURE SKIN 1/2X4 (GAUZE/BANDAGES/DRESSINGS) ×2 IMPLANT
SUT MNCRL AB 4-0 PS2 18 (SUTURE) ×2 IMPLANT
SUT SILK 2 0 (SUTURE)
SUT SILK 2-0 18XBRD TIE 12 (SUTURE) IMPLANT
SUT SILK 3 0 (SUTURE)
SUT SILK 3-0 18XBRD TIE 12 (SUTURE) IMPLANT
SUT VIC AB 3-0 SH 18 (SUTURE) ×4 IMPLANT
SYR BULB IRRIGATION 50ML (SYRINGE) ×2 IMPLANT
SYR CONTROL 10ML LL (SYRINGE) ×2 IMPLANT
TAPE CLOTH SURG 4X10 WHT LF (GAUZE/BANDAGES/DRESSINGS) ×1 IMPLANT
TOWEL OR 17X26 10 PK STRL BLUE (TOWEL DISPOSABLE) ×2 IMPLANT
TOWEL OR NON WOVEN STRL DISP B (DISPOSABLE) ×2 IMPLANT
YANKAUER SUCT BULB TIP 10FT TU (MISCELLANEOUS) ×2 IMPLANT

## 2013-08-30 NOTE — Brief Op Note (Signed)
08/30/2013  4:44 PM  PATIENT:  [name]  80 y.o. female  PRE-OPERATIVE DIAGNOSIS:  Primary hyperparathyroidism  POST-OPERATIVE DIAGNOSIS:  same  PROCEDURE:  Left inferior and superior parathyroidectomies (minimally invasive)  SURGEON:  Surgeon(s) and Role:    * Earnstine Regal, MD - Primary  ANESTHESIA:   general  EBL:  Total I/O In: 900 [I.V.:900] Out: 20 [Blood:20]  BLOOD ADMINISTERED:none  DRAINS: none   LOCAL MEDICATIONS USED:  MARCAINE     SPECIMEN:  Excision  DISPOSITION OF SPECIMEN:  PATHOLOGY  COUNTS:  YES  TOURNIQUET:  * No tourniquets in log *  DICTATION: .Other Dictation: Dictation Number [id number]  PLAN OF CARE: Discharge to home after PACU  PATIENT DISPOSITION:  PACU - hemodynamically stable.   Delay start of Pharmacological VTE agent (>24hrs) due to surgical blood loss or risk of bleeding: yes  Earnstine Regal, MD, [facility] Office: [phone]

## 2013-08-30 NOTE — Anesthesia Postprocedure Evaluation (Signed)
Anesthesia Post Note  Patient: [name]  Procedure(s) Performed: Procedure(s) (LRB): LEFT INFERIOR PARATHYROIDECTOMY (Left)  Anesthesia type: General  Patient location: PACU  Post pain: Pain level controlled  Post assessment: Post-op Vital signs reviewed  Last Vitals:  Filed Vitals:   08/30/13 1728  BP: 182/78  Pulse: 56  Temp: 36.7 C  Resp: 20    Post vital signs: Reviewed  Level of consciousness: sedated  Complications: No apparent anesthesia complications

## 2013-08-30 NOTE — Anesthesia Preprocedure Evaluation (Addendum)
Anesthesia Evaluation  Patient identified by MRN, date of birth, ID band Patient awake    Reviewed: Allergy & Precautions, H&P , NPO status , Patient's Chart, lab work & pertinent test results  Airway Mallampati: II TM Distance: >3 FB     Dental  (+) Teeth Intact, Caps, Dental Advisory Given   Pulmonary neg pulmonary ROS,  breath sounds clear to auscultation- rhonchi  Pulmonary exam normal       Cardiovascular negative cardio ROS  Rhythm:Regular Rate:Normal + Systolic murmurs    Neuro/Psych negative neurological ROS  negative psych ROS   GI/Hepatic negative GI ROS, Neg liver ROS, Crohns disease   Endo/Other  negative endocrine ROSHyperparathyroidism  Renal/GU Renal diseasenegative Renal ROS  negative genitourinary   Musculoskeletal negative musculoskeletal ROS (+)   Abdominal   Peds  Hematology negative hematology ROS (+)   Anesthesia Other Findings   Reproductive/Obstetrics                         Anesthesia Physical Anesthesia Plan  ASA: II  Anesthesia Plan: General   Post-op Pain Management:    Induction: Intravenous  Airway Management Planned: Oral ETT  Additional Equipment:   Intra-op Plan:   Post-operative Plan: Extubation in OR  Informed Consent: I have reviewed the patients History and Physical, chart, labs and discussed the procedure including the risks, benefits and alternatives for the proposed anesthesia with the patient or authorized representative who has indicated his/her understanding and acceptance.   Dental advisory given  Plan Discussed with: CRNA  Anesthesia Plan Comments:        Anesthesia Quick Evaluation

## 2013-08-30 NOTE — Anesthesia Procedure Notes (Signed)
Procedure Name: Intubation Date/Time: 08/30/2013 3:30 PM Performed by: Danley Danker L Patient Re-evaluated:Patient Re-evaluated prior to inductionOxygen Delivery Method: Circle system utilized Preoxygenation: Pre-oxygenation with 100% oxygen Intubation Type: IV induction Ventilation: Mask ventilation without difficulty Laryngoscope Size: Miller and 2 Grade View: Grade I Tube type: Reinforced Tube size: 7.5 mm Number of attempts: 1 Airway Equipment and Method: Stylet Placement Confirmation: ETT inserted through vocal cords under direct vision,  breath sounds checked- equal and bilateral and positive ETCO2 Secured at: 20 cm Tube secured with: Tape Dental Injury: Teeth and Oropharynx as per pre-operative assessment

## 2013-08-30 NOTE — Interval H&P Note (Signed)
History and Physical Interval Note:  08/30/2013 2:48 PM  [name]  has presented today for surgery, with the diagnosis of hyperparathyroidism.  The various methods of treatment have been discussed with the patient and family. After consideration of risks, benefits and other options for treatment, the patient has consented to    Procedure(s): LEFT INFERIOR PARATHYROIDECTOMY (Left) as a surgical intervention .    The patient's history has been reviewed, patient examined, no change in status, stable for surgery.  I have reviewed the patient's chart and labs.  Questions were answered to the patient's satisfaction.    Earnstine Regal, MD, [facility] Office: [place]

## 2013-08-30 NOTE — Preoperative (Signed)
Beta Blockers   Reason not to administer Beta Blockers:Not Applicable 

## 2013-08-30 NOTE — Transfer of Care (Signed)
Immediate Anesthesia Transfer of Care Note  Patient: [name]  Procedure(s) Performed: Procedure(s): LEFT INFERIOR PARATHYROIDECTOMY (Left)  Patient Location: PACU  Anesthesia Type:General  Level of Consciousness: sedated  Airway & Oxygen Therapy: Patient Spontanous Breathing and Patient connected to face mask oxygen  Post-op Assessment: Report given to PACU RN and Post -op Vital signs reviewed and stable  Post vital signs: Reviewed and stable  Complications: No apparent anesthesia complications

## 2013-08-30 NOTE — H&P (View-Only) (Signed)
General Surgery [facility]  Chief Complaint  Patient presents with  . New Evaluation    primary hyperparathyroidism - referral from Dr. Elveria Rising. [name]    HISTORY: Patient is a 80 year old female referred by her primary care physician for evaluation of primary hyperparathyroidism. Patient was noted on routine laboratory studies to have an elevated calcium level ranging from 10.6-11.0 over the past 2 years. Most recent studies from February 2015 show an elevated calcium level of 11.0 and an elevated intact PTH level of 297.7. Patient underwent 24-hour urine collection for calcium which was elevated at 252. Vitamin D level was nearly normal at 29. Patient was sent for nuclear medicine parathyroid scan performed on 08/11/2013. This localized an area of uptake to the left inferior position consistent with parathyroid adenoma.  Patient has no prior history of parathyroid disease. She has had no prior head or neck surgery. There is no family history of parathyroid disease and no family history of endocrine neoplasms.  Past Medical History  Diagnosis Date  . Crohn's disease   . Bladder prolapse, female, acquired     pessary in  . Osteoporosis   . Pubic ramus fracture   . Hyperparathyroidism   . Vitamin D deficiency   . History of hypercalcemia   . History of hyperglycemia   . Hydronephrosis   . Urinary incontinence 09/09/2011  . Complete uterine prolapse   . E. coli UTI 06/09/2013  . Fall 05/2013  . Multiple pelvic fractures 07/02/2013    Current Outpatient Prescriptions  Medication Sig Dispense Refill  . aspirin 81 MG tablet Take 81 mg by mouth daily.      . cholecalciferol (VITAMIN D) 1000 UNITS tablet Take 1,000 Units by mouth daily.      . mercaptopurine (PURINETHOL) 50 MG tablet Take 75 mg by mouth daily. Give on an empty stomach 1 hour before or 2 hours after meals. Caution: Chemotherapy.       No current facility-administered medications for this visit.     No Known Allergies  Family History  Problem Relation Age of Onset  . Heart disease Father   . Colon cancer Father   . Osteoporosis Mother   . Breast cancer Mother   . Prostate cancer Brother     History   Social History  . Marital Status: Widowed    Spouse Name: N/A    Number of Children: 3  . Years of Education: N/A   Occupational History  . Retired    Social History Main Topics  . Smoking status: Never Smoker   . Smokeless tobacco: Never Used  . Alcohol Use: No     Comment: wine  . Drug Use: No  . Sexual Activity: No     Comment: because of prolapse and pessary   Other Topics Concern  . None   Social History Narrative   Widowed.  Lives with a housemate.     3 kids.     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    REVIEW OF SYSTEMS - PERTINENT POSITIVES ONLY: History of osteoporosis. Chronic fatigue. Bone and joint pain. Denies nephrolithiasis.  EXAM: Filed Vitals:   08/24/13 1426  BP: 136/72  Pulse: 72  Temp: 99 F (37.2 C)  Resp: 14    GENERAL: well-developed, well-nourished, no acute distress HEENT: normocephalic; pupils equal and reactive; sclerae clear; dentition good; mucous membranes moist NECK:  No palpable masses  in the thyroid bed; symmetric on extension; no palpable anterior or posterior cervical lymphadenopathy; no supraclavicular masses; no tenderness CHEST: clear to auscultation bilaterally without rales, rhonchi, or wheezes CARDIAC: regular rate and rhythm without significant murmur; peripheral pulses are full EXT:  non-tender without edema; no deformity NEURO: no gross focal deficits; no sign of tremor   LABORATORY RESULTS: See [facility] (CHL-Epic) for most recent results  RADIOLOGY RESULTS: See [facility] (CHL-Epic) for most recent results  IMPRESSION: #1 primary hyperparathyroidism, likely left inferior parathyroid adenoma #2 osteoporosis  PLAN: I  discussed the above findings at length with the patient and her son, who is a Animal nutritionist. We discussed her test results. I provided him with written literature on parathyroid disease and its surgical management to review at home.  I have recommended minimally invasive parathyroidectomy as an outpatient surgical procedure. We discussed the risk and benefits of the procedure including the possibility of recurrent laryngeal nerve injury and the possibility of a second gland adenoma. We discussed options for management including 4 gland exploration. They would like to proceed with minimally invasive parathyroidectomy. We will make arrangements for surgery at a time convenient for the patient in the near future.  The risks and benefits of the procedure have been discussed at length with the patient.  The patient understands the proposed procedure, potential alternative treatments, and the course of recovery to be expected.  All of the patient's questions have been answered at this time.  The patient wishes to proceed with surgery.  Earnstine Regal, MD, [facility]  Primary Care Physician: Elsie Stain, MD

## 2013-08-31 ENCOUNTER — Other Ambulatory Visit (INDEPENDENT_AMBULATORY_CARE_PROVIDER_SITE_OTHER): Payer: Self-pay

## 2013-08-31 ENCOUNTER — Telehealth (INDEPENDENT_AMBULATORY_CARE_PROVIDER_SITE_OTHER): Payer: Self-pay

## 2013-08-31 ENCOUNTER — Encounter (HOSPITAL_COMMUNITY): Payer: Self-pay | Admitting: Surgery

## 2013-08-31 DIAGNOSIS — E213 Hyperparathyroidism, unspecified: Secondary | ICD-10-CM

## 2013-08-31 NOTE — Op Note (Signed)
[name], [name] NO.:  [id number]  MEDICAL RECORD NO.:  [id number]  LOCATION:  [facility]                         FACILITY:  [facility]  PHYSICIAN:  Earnstine Regal, MD      DATE OF BIRTH:  12-14-1932  DATE OF PROCEDURE:  08/30/2013                               OPERATIVE REPORT   PREOPERATIVE DIAGNOSIS:  Primary hyperparathyroidism.  POSTOPERATIVE DIAGNOSIS:  Primary hyperparathyroidism.  PROCEDURE:  Left inferior and left superior minimally invasive parathyroidectomy.  SURGEON:  Earnstine Regal, MD, FACS  ANESTHESIA:  General per Nena Alexander. Fortune, M.D.  ESTIMATED BLOOD LOSS:  Minimal.  PREPARATION:  ChloraPrep.  COMPLICATIONS:  None.  INDICATIONS:  The patient is a 80 year old female referred by her primary care physician for evaluation of primary hyperparathyroidism. Laboratory studies show an elevated serum calcium level of 11.0 with an elevated intact PTH level of 297.7.  A 24-hour urine collection for calcium was elevated at 252.  Vitamin D was nearly normal at 29. Nuclear Medicine parathyroid scan performed August 11, 2013, localizes a parathyroid adenoma to the left inferior position.  The patient now comes to Surgery for resection.  BODY OF REPORT:  Procedures done in OR #11 at the [facility].  The patient was brought to the operating room, placed in supine position on the operating room table.  Following administration of general anesthesia, the patient was positioned and then prepped and draped in the usual aseptic fashion.  After ascertaining that an adequate level of anesthesia had been achieved, a left inferior neck incision was made with a #15 blade.  Dissection was carried through the subcutaneous tissues and the platysma was divided with the electrocautery.  Skin flaps were elevated circumferentially with the electrocautery and a Weitlaner retractor placed for exposure.  Strap muscles were incised in the midline and  reflected to the left exposing the left thyroid lobe.  At the inferior pole of the left thyroid lobe this was what appears to be a small normal parathyroid gland.  This matches the location seen on sestamibi scan.  Therefore, it was resected with a vascular pedicle ligated with a small Ligaclip.  The gland was submitted to Pathology, where frozen section confirms parathyroid tissue which appears essentially normal.  The neck was further explored.  Deep to the thyroid lobe alongside the lateral edge of the esophagus, he has an enlarged parathyroid gland. This was attached to the deep portion of the thyroid.  It was gently dissected free and vascular structures divided between small Ligaclips. It was excised and measures greater than 1 cm in diameter.  It was submitted to Pathology where frozen section confirms parathyroid tissue consistent with parathyroid adenoma.  This gland likely represents a superior parathyroid gland which descended somewhat down the left neck to come to lie in a mildly ectopic position.  Good hemostasis was achieved throughout the wound.  Fibrillar was placed throughout the operative field.  Strap muscles were reapproximated in the midline with interrupted 3-0 Vicryl sutures.  Platysma was closed with interrupted 3-0 Vicryl sutures.  The skin was anesthetized with local anesthetic.  Skin was closed with a running 4-0 Monocryl subcuticular suture.  Wound  was washed and dried and benzoin and Steri- Strips were applied.  Sterile dressings were applied.  The patient was awakened from anesthesia and brought to the recovery room.  The patient tolerated the procedure well.   Earnstine Regal, MD, [facility] Office: [phone]   TMG/MEDQ  D:  08/30/2013  T:  08/31/2013  Job:  [id number]  cc:   Elveria Rising. Damita Dunnings, M.D. Fax: [phone]

## 2013-08-31 NOTE — Telephone Encounter (Signed)
Pt home doing well. PO appt made. Lab slip mailed to pt.  

## 2013-09-20 DIAGNOSIS — E213 Hyperparathyroidism, unspecified: Secondary | ICD-10-CM | POA: Diagnosis not present

## 2013-09-20 LAB — CALCIUM: CALCIUM: 9.8 mg/dL (ref 8.4–10.5)

## 2013-09-22 ENCOUNTER — Ambulatory Visit (INDEPENDENT_AMBULATORY_CARE_PROVIDER_SITE_OTHER): Payer: Medicare Other | Admitting: Surgery

## 2013-09-22 ENCOUNTER — Encounter (INDEPENDENT_AMBULATORY_CARE_PROVIDER_SITE_OTHER): Payer: Self-pay | Admitting: Surgery

## 2013-09-22 ENCOUNTER — Encounter (INDEPENDENT_AMBULATORY_CARE_PROVIDER_SITE_OTHER): Payer: Medicare Other | Admitting: Surgery

## 2013-09-22 VITALS — BP 130/75 | HR 61 | Temp 97.5°F | Resp 16 | Ht 61.0 in | Wt 110.8 lb

## 2013-09-22 DIAGNOSIS — E210 Primary hyperparathyroidism: Secondary | ICD-10-CM

## 2013-09-22 NOTE — Addendum Note (Signed)
Addended by: Dois Davenport on: 09/22/2013 11:55 AM   Modules accepted: Orders

## 2013-09-22 NOTE — Progress Notes (Signed)
General Surgery [facility]  Chief Complaint  Patient presents with  . Routine Post Op    parathyroidectomy 08/30/2013    HISTORY: Patient is a 80 year old female who underwent minimally invasive parathyroidectomy on 08/30/2013. Final pathology shows a parathyroid adenoma likely arising from the left superior position. The left inferior parathyroid gland was histologically normal. Postoperative calcium level has returned to the normal range at 9.8.  EXAM: Surgical wound is healing nicely. Minimal soft tissue swelling. No sign of infection. No sign of seroma. Voice quality is normal.  IMPRESSION: Status post minimally invasive parathyroidectomy  PLAN: Patient will begin applying topical creams to her incision. She will return to see me in 6 weeks. We will check an intact PTH level and a serum calcium level prior to that office visit.  Earnstine Regal, MD, [facility]   Visit Diagnoses: 1. Primary hyperparathyroidism

## 2013-09-22 NOTE — Patient Instructions (Signed)
  CARE OF INCISION   Apply cocoa butter/vitamin E cream (Palmer's brand) to your incision 2 - 3 times daily.  Massage cream into incision for one minute with each application.  Use sunscreen (50 SPF or higher) for first 6 months after surgery if area is exposed to sun.  You may alternate Mederma or other scar reducing cream with cocoa butter cream if desired.       Earnstine Regal, MD, [facility]      Office: [phone]

## 2013-10-07 ENCOUNTER — Other Ambulatory Visit: Payer: Self-pay | Admitting: Internal Medicine

## 2013-11-02 ENCOUNTER — Telehealth: Payer: Self-pay

## 2013-11-02 NOTE — Telephone Encounter (Signed)
Pt has billing question; advised pt to call [phone].

## 2013-11-05 DIAGNOSIS — E210 Primary hyperparathyroidism: Secondary | ICD-10-CM | POA: Diagnosis not present

## 2013-11-08 LAB — PTH, INTACT AND CALCIUM
CALCIUM: 9 mg/dL (ref 8.4–10.5)
PTH: 60.2 pg/mL (ref 14.0–72.0)

## 2013-11-09 ENCOUNTER — Encounter (INDEPENDENT_AMBULATORY_CARE_PROVIDER_SITE_OTHER): Payer: Self-pay | Admitting: Surgery

## 2013-11-09 ENCOUNTER — Ambulatory Visit (INDEPENDENT_AMBULATORY_CARE_PROVIDER_SITE_OTHER): Payer: Medicare Other | Admitting: Surgery

## 2013-11-09 VITALS — BP 138/72 | HR 78 | Temp 98.3°F | Resp 16 | Ht 61.0 in | Wt 112.6 lb

## 2013-11-09 DIAGNOSIS — E210 Primary hyperparathyroidism: Secondary | ICD-10-CM

## 2013-11-09 NOTE — Patient Instructions (Signed)
  CARE OF INCISION   Apply cocoa butter/vitamin E cream (Palmer's brand) to your incision 2 - 3 times daily.  Massage cream into incision for one minute with each application.  Use sunscreen (50 SPF or higher) for first 6 months after surgery if area is exposed to sun.  You may alternate Mederma or other scar reducing cream with cocoa butter cream if desired.       Earnstine Regal, MD, [facility]      Office: [phone]

## 2013-11-09 NOTE — Progress Notes (Signed)
General Surgery [facility]  Chief Complaint  Patient presents with  . Routine Post Op    left parathyroidectomy 08/30/2013    HISTORY: Patient is a 80 year old female who underwent parathyroidectomy on 08/30/2013. Final laboratories now show a normal calcium level of 9.0 and a normal intact PTH level of 60.2. Patient returns today for a final wound check.  EXAM: Surgical incision is healed nicely with a good cosmetic result. Voice quality is normal. No sign of infection.  IMPRESSION: Status post left parathyroidectomy  PLAN: The patient is released to full activity without restriction. She will continue to apply topical creams to her incision.  Patient will return for surgical care as needed.  Earnstine Regal, MD, [facility]   Visit Diagnoses: 1. Primary hyperparathyroidism

## 2014-02-07 ENCOUNTER — Ambulatory Visit (INDEPENDENT_AMBULATORY_CARE_PROVIDER_SITE_OTHER): Payer: Medicare Other | Admitting: Obstetrics & Gynecology

## 2014-02-07 ENCOUNTER — Encounter: Payer: Self-pay | Admitting: Obstetrics & Gynecology

## 2014-02-07 VITALS — BP 145/64 | HR 64 | Temp 98.6°F | Ht 60.5 in | Wt 110.2 lb

## 2014-02-07 DIAGNOSIS — N814 Uterovaginal prolapse, unspecified: Secondary | ICD-10-CM

## 2014-02-07 NOTE — Progress Notes (Signed)
   Subjective:    Patient ID: [name], female    DOB: 16-Dec-1932, 80 y.o.   MRN: [id number]  HPI  80 yo lady with prolapse who is here today with no complaints, merely here to have her pessary cleaned.   Review of Systems     Objective:   Physical Exam  Her vagina shows no erosions. All normal I cleaned her pessary and replaced it.      Assessment & Plan:  Prolapse- continue monthly pessary checks

## 2014-02-21 ENCOUNTER — Other Ambulatory Visit: Payer: Self-pay | Admitting: Internal Medicine

## 2014-03-08 ENCOUNTER — Telehealth: Payer: Self-pay | Admitting: General Practice

## 2014-03-08 NOTE — Telephone Encounter (Signed)
Patient called and left message stating she is calling for Dr Hulan Fray and needs a call back today. Called patient back and she states that she is unable to come tomorrow and needs to reschedule. Told patient we will get that taken care of for her and the front office staff will call her back with a new appt. Patient verbalized understanding and had no other questions

## 2014-03-09 ENCOUNTER — Ambulatory Visit: Payer: Medicare Other | Admitting: Obstetrics & Gynecology

## 2014-03-30 ENCOUNTER — Ambulatory Visit: Payer: Medicare Other | Admitting: Obstetrics & Gynecology

## 2014-04-11 ENCOUNTER — Encounter: Payer: Self-pay | Admitting: Obstetrics & Gynecology

## 2014-04-27 DIAGNOSIS — L820 Inflamed seborrheic keratosis: Secondary | ICD-10-CM | POA: Diagnosis not present

## 2014-04-27 DIAGNOSIS — L570 Actinic keratosis: Secondary | ICD-10-CM | POA: Diagnosis not present

## 2014-07-14 ENCOUNTER — Telehealth: Payer: Self-pay | Admitting: Internal Medicine

## 2014-07-14 DIAGNOSIS — K5090 Crohn's disease, unspecified, without complications: Secondary | ICD-10-CM

## 2014-07-14 NOTE — Telephone Encounter (Signed)
Informed patient that rx would be refilled and put lab orders in.  She is aware of the lab hours.  She will call back to make her appointment as she was on her way out the door.

## 2014-07-14 NOTE — Telephone Encounter (Signed)
She needs to get in to see [place] I will also tell her daughter Joaquin Music) -  Has not had labs in a while so we can do 1-2 month refill but  need her to get CBC and  A CMET and needs an appt.

## 2014-07-14 NOTE — Telephone Encounter (Signed)
Pt calling regarding this refill request - would like it refilled.

## 2014-07-14 NOTE — Telephone Encounter (Signed)
Please advise Sir, thank you. 

## 2014-07-21 ENCOUNTER — Other Ambulatory Visit (INDEPENDENT_AMBULATORY_CARE_PROVIDER_SITE_OTHER): Payer: Medicare HMO

## 2014-07-21 DIAGNOSIS — K5090 Crohn's disease, unspecified, without complications: Secondary | ICD-10-CM | POA: Diagnosis not present

## 2014-07-21 LAB — CBC WITH DIFFERENTIAL/PLATELET
Basophils Absolute: 0 10*3/uL (ref 0.0–0.1)
Basophils Relative: 0.4 % (ref 0.0–3.0)
EOS PCT: 5.9 % — AB (ref 0.0–5.0)
Eosinophils Absolute: 0.2 10*3/uL (ref 0.0–0.7)
HCT: 34.7 % — ABNORMAL LOW (ref 36.0–46.0)
Hemoglobin: 12.2 g/dL (ref 12.0–15.0)
LYMPHS PCT: 21.4 % (ref 12.0–46.0)
Lymphs Abs: 0.6 10*3/uL — ABNORMAL LOW (ref 0.7–4.0)
MCHC: 35 g/dL (ref 30.0–36.0)
MCV: 108.1 fl — ABNORMAL HIGH (ref 78.0–100.0)
MONOS PCT: 8.8 % (ref 3.0–12.0)
Monocytes Absolute: 0.3 10*3/uL (ref 0.1–1.0)
Neutro Abs: 1.8 10*3/uL (ref 1.4–7.7)
Neutrophils Relative %: 63.5 % (ref 43.0–77.0)
Platelets: 195 10*3/uL (ref 150.0–400.0)
RBC: 3.21 Mil/uL — ABNORMAL LOW (ref 3.87–5.11)
RDW: 16 % — ABNORMAL HIGH (ref 11.5–15.5)
WBC: 2.9 10*3/uL — AB (ref 4.0–10.5)

## 2014-07-21 LAB — COMPREHENSIVE METABOLIC PANEL
ALT: 18 U/L (ref 0–35)
AST: 16 U/L (ref 0–37)
Albumin: 4 g/dL (ref 3.5–5.2)
Alkaline Phosphatase: 63 U/L (ref 39–117)
BUN: 22 mg/dL (ref 6–23)
CO2: 27 meq/L (ref 19–32)
Calcium: 9 mg/dL (ref 8.4–10.5)
Chloride: 106 mEq/L (ref 96–112)
Creatinine, Ser: 0.81 mg/dL (ref 0.40–1.20)
GFR: 72.47 mL/min (ref >60.00)
GLUCOSE: 93 mg/dL (ref 70–99)
POTASSIUM: 4 meq/L (ref 3.5–5.1)
SODIUM: 138 meq/L (ref 135–145)
TOTAL PROTEIN: 6.8 g/dL (ref 6.0–8.3)
Total Bilirubin: 0.9 mg/dL (ref 0.2–1.2)

## 2014-07-26 NOTE — Progress Notes (Signed)
Quick Note:  Labs "ok" Has she made f/u appt?  PJ was trying to set up - needs a late PM appt so [name] therapist) can bring her ______

## 2014-09-08 ENCOUNTER — Encounter: Payer: Self-pay | Admitting: Gastroenterology

## 2014-09-21 ENCOUNTER — Ambulatory Visit (INDEPENDENT_AMBULATORY_CARE_PROVIDER_SITE_OTHER): Payer: Medicare HMO | Admitting: Internal Medicine

## 2014-09-21 ENCOUNTER — Encounter: Payer: Self-pay | Admitting: Internal Medicine

## 2014-09-21 VITALS — BP 158/70 | HR 56 | Ht 61.0 in | Wt 107.0 lb

## 2014-09-21 DIAGNOSIS — K5000 Crohn's disease of small intestine without complications: Secondary | ICD-10-CM

## 2014-09-21 DIAGNOSIS — Z79899 Other long term (current) drug therapy: Secondary | ICD-10-CM

## 2014-09-21 DIAGNOSIS — N8110 Cystocele, unspecified: Secondary | ICD-10-CM

## 2014-09-21 DIAGNOSIS — M810 Age-related osteoporosis without current pathological fracture: Secondary | ICD-10-CM

## 2014-09-21 DIAGNOSIS — Z7960 Long term (current) use of unspecified immunomodulators and immunosuppressants: Secondary | ICD-10-CM

## 2014-09-21 MED ORDER — VITAMIN D 1000 UNITS PO TABS: 1000.0000 [IU] | ORAL_TABLET | Freq: Every day | ORAL | Status: DC

## 2014-09-21 MED ORDER — CALCIUM 600 MG PO TABS: 600.0000 mg | ORAL_TABLET | Freq: Two times a day (BID) | ORAL | Status: DC

## 2014-09-21 NOTE — Assessment & Plan Note (Signed)
Needs f/u pcp

## 2014-09-21 NOTE — Assessment & Plan Note (Signed)
stable °

## 2014-09-21 NOTE — Assessment & Plan Note (Signed)
No toxicity  

## 2014-09-21 NOTE — Patient Instructions (Addendum)
Please make appointments with Dr Damita Dunnings and Dr Hulan Fray.  We will contact you about labs you need in June.   Please follow up with Dr. Carlean Purl in six months.    I appreciate the opportunity to care for you.

## 2014-09-21 NOTE — Assessment & Plan Note (Signed)
Needs f/u pesary

## 2014-09-22 NOTE — Progress Notes (Signed)
   Subjective:    Patient ID: [name], female    DOB: Oct 10, 1932, 81 y.o.   MRN: [id number] Cc: f/u Crohn's HPI Reports she is well w/o abdominal pain or diarrhea. Still grieving husband's death > 1 year ago. Has not seen PCP or GYN since last year. Not on osteoporosis Tx.  Medications, allergies, past medical history, past surgical history, family history and social history are reviewed and updated in the EMR.  Review of Systems Some fatigue, ambulates with cane    Objective:   Physical Exam @BP  158/70 mmHg  Pulse 56  Ht 5' 1"  (1.549 m)  Wt 107 lb (48.535 kg)  BMI 20.23 kg/m2@  General:  NAD Eyes:   anicteric Lungs:  clear Heart:: S1S2 no rubs, murmurs or gallops Abdomen:  soft and nontender, BS+ Ext:   no edema, cyanosis or clubbing    Data Reviewed:  Lab Results  Component Value Date   WBC 2.9* 07/21/2014   HGB 12.2 07/21/2014   HCT 34.7* 07/21/2014   MCV 108.1* 07/21/2014   PLT 195.0 07/21/2014   Lab Results  Component Value Date   CREATININE 0.81 07/21/2014   BUN 22 07/21/2014   NA 138 07/21/2014   K 4.0 07/21/2014   CL 106 07/21/2014   CO2 27 07/21/2014   Lab Results  Component Value Date   ALT 18 07/21/2014   AST 16 07/21/2014   ALKPHOS 63 07/21/2014   BILITOT 0.9 07/21/2014      Assessment & Plan:  CROHN'S DISEASE, SMALL INTESTINE stable   Osteoporosis Needs f/u pcp   Bladder prolapse, female, acquired Needs f/u pesary   Long-term use of immunosuppressant medication No toxicity   See me in 6 months

## 2014-09-23 ENCOUNTER — Other Ambulatory Visit: Payer: Self-pay | Admitting: Internal Medicine

## 2014-11-11 ENCOUNTER — Encounter: Payer: Self-pay | Admitting: Obstetrics & Gynecology

## 2014-11-11 ENCOUNTER — Ambulatory Visit (INDEPENDENT_AMBULATORY_CARE_PROVIDER_SITE_OTHER): Payer: Medicare HMO | Admitting: Obstetrics & Gynecology

## 2014-11-11 VITALS — BP 155/85 | HR 66 | Temp 98.1°F | Wt 108.3 lb

## 2014-11-11 DIAGNOSIS — N993 Prolapse of vaginal vault after hysterectomy: Secondary | ICD-10-CM

## 2014-11-11 NOTE — Progress Notes (Signed)
Patient referred to Dr. Maryland Pink [phone] [address]. [place]). Appointment made for November 23, 2014 at 1400. Patient given appt date, time, location and phone number. Records to be faxed by front office staff to [phone].

## 2014-11-11 NOTE — Progress Notes (Signed)
   Subjective:    Patient ID: [name], female    DOB: May 15, 1933, 81 y.o.   MRN: [id number]  HPI  This lovely 81 yo lady is here for what is supposed to be her monthly pessary cleaning. It has been since 8/15 since she was last seen. I have spoken at length with her and her son (a Animal nutritionist) and her daughter (a Immunologist) about the importance of having her vaginal mucosa inspected at least on a monthly basis and the risk of bladder/rectum erosion with extended periods of use.  She is here with no complaints.  Review of Systems Her relationship with her kids is contentious.    Objective:   Physical Exam  WNWHfrail white woman, walking with a cane (previously used a walker)  Her vaginal mucosa is thankfully intact with no erosions There are no masses with bimanual exam      Assessment & Plan:  Vaginal prolapse- I do not feel that she will reliably come back to have her vaginal vault inspected. Therefore I will not put her pessary back in. She is understandably not happy about this. I will refer her to Dr. [place] Pink.

## 2014-11-16 DIAGNOSIS — N819 Female genital prolapse, unspecified: Secondary | ICD-10-CM | POA: Insufficient documentation

## 2014-11-28 ENCOUNTER — Other Ambulatory Visit: Payer: Self-pay | Admitting: Internal Medicine

## 2015-01-05 ENCOUNTER — Telehealth: Payer: Self-pay | Admitting: Family Medicine

## 2015-01-05 NOTE — Telephone Encounter (Signed)
Will see at OV 

## 2015-01-05 NOTE — Telephone Encounter (Signed)
Patient Name: [name] DOB: 1932-09-28 Initial Comment Caller states her bp this morning 172/94 doesn't know what time. Was dizzy then. Now sitting down, okay. Nurse Assessment Nurse: Marcelline Deist, RN, Lynda Date/Time (Eastern Time): 01/05/2015 1:28:15 PM Confirm and document reason for call. If symptomatic, describe symptoms. ---Caller states her BP this morning was 152/79 & 172/94, doesn't know what time. Was dizzy then. Now sitting down, okay. Now it is 146/86 & 147/84. Has the patient traveled out of the country within the last 30 days? ---Not Applicable Does the patient require triage? ---Yes Related visit to physician within the last 2 weeks? ---No Does the PT have any chronic conditions? (i.e. diabetes, asthma, etc.) ---Yes List chronic conditions. ---Crohn's disease Guidelines Guideline Title Affirmed Question Affirmed Notes High Blood Pressure Wants doctor to measure BP Final Disposition User See PCP within [place] [address], RN, Kermit Balo Comments Caller has a friend over who will check her BP & wants nurse to call back in a few minutes. Caller states she has an appt. on Monday to see her Dr. Bryan Lemma that BP can flucuate depending on position, etc. Her need to hold on to things as she gets around is what she has been doing, not new. States she is not having any symptoms now & she is not sure if the friends that checked her BP know how to properly take a BP. Disagree/Comply: Comply

## 2015-01-05 NOTE — Telephone Encounter (Signed)
Pt has appt to see Dr Damita Dunnings on 01/09/15 at 3 PM.

## 2015-01-05 NOTE — Telephone Encounter (Signed)
error 

## 2015-01-09 ENCOUNTER — Encounter: Payer: Self-pay | Admitting: Family Medicine

## 2015-01-09 ENCOUNTER — Ambulatory Visit (INDEPENDENT_AMBULATORY_CARE_PROVIDER_SITE_OTHER): Payer: Medicare HMO | Admitting: Family Medicine

## 2015-01-09 VITALS — BP 142/70 | HR 70 | Temp 98.3°F | Wt 105.5 lb

## 2015-01-09 DIAGNOSIS — R42 Dizziness and giddiness: Secondary | ICD-10-CM | POA: Diagnosis not present

## 2015-01-09 NOTE — Patient Instructions (Signed)
Drink enough fluid to keep your urine clear or at least light colored.  Arise slowly, especially in the mornings after you have been laying down.  Update me as needed.  Take care.

## 2015-01-09 NOTE — Progress Notes (Signed)
Pre visit review using our clinic review tool, if applicable. No additional management support is needed unless otherwise documented below in the visit note.  No vertigo.  Was recently lightheaded, worse in the AM.  No CP, SOB, BLE edema.   No syncope.  Not on BP meds. Possible error with prev BP readings at home recently. BP was reasonable today at OV.  D/w pt at [place], along with her daughter. She has a Youth worker and that has been stressful for her.  She is safe at home.    Meds, vitals, and allergies reviewed.   ROS: See HPI.  Otherwise, noncontributory.  GEN: nad, alert and oriented, elderly, speech at baseline HEENT: mucous membranes moist NECK: supple w/o LA CV: rrr PULM: ctab, no inc wob ABD: soft, +bs EXT: no edema

## 2015-01-09 NOTE — Assessment & Plan Note (Signed)
No sx on standing at OV.  Exam unremarkable. No h/o vertigo.  Unclear if BP readings at home were accurate.  D/w pt about making sure to have enough fluid intake and arising cautiously.  No falls.  She agrees.  Update me as needed.

## 2015-02-02 ENCOUNTER — Other Ambulatory Visit: Payer: Self-pay | Admitting: Internal Medicine

## 2015-02-15 DIAGNOSIS — N813 Complete uterovaginal prolapse: Secondary | ICD-10-CM | POA: Insufficient documentation

## 2015-02-22 ENCOUNTER — Other Ambulatory Visit (INDEPENDENT_AMBULATORY_CARE_PROVIDER_SITE_OTHER): Payer: Medicare HMO

## 2015-02-22 ENCOUNTER — Other Ambulatory Visit: Payer: Self-pay

## 2015-02-22 ENCOUNTER — Telehealth: Payer: Self-pay | Admitting: Internal Medicine

## 2015-02-22 DIAGNOSIS — Z79899 Other long term (current) drug therapy: Secondary | ICD-10-CM | POA: Diagnosis not present

## 2015-02-22 DIAGNOSIS — Z7960 Long term (current) use of unspecified immunomodulators and immunosuppressants: Secondary | ICD-10-CM

## 2015-02-22 LAB — CBC WITH DIFFERENTIAL/PLATELET
BASOS PCT: 0.6 % (ref 0.0–3.0)
Basophils Absolute: 0 10*3/uL (ref 0.0–0.1)
EOS ABS: 0.1 10*3/uL (ref 0.0–0.7)
EOS PCT: 2.8 % (ref 0.0–5.0)
HCT: 36.7 % (ref 36.0–46.0)
Hemoglobin: 12.6 g/dL (ref 12.0–15.0)
LYMPHS ABS: 0.6 10*3/uL — AB (ref 0.7–4.0)
Lymphocytes Relative: 20.8 % (ref 12.0–46.0)
MCHC: 34.5 g/dL (ref 30.0–36.0)
MCV: 110.7 fl — ABNORMAL HIGH (ref 78.0–100.0)
Monocytes Absolute: 0.2 10*3/uL (ref 0.1–1.0)
Monocytes Relative: 8.3 % (ref 3.0–12.0)
NEUTROS PCT: 67.5 % (ref 43.0–77.0)
Neutro Abs: 1.9 10*3/uL (ref 1.4–7.7)
PLATELETS: 235 10*3/uL (ref 150.0–400.0)
RBC: 3.32 Mil/uL — ABNORMAL LOW (ref 3.87–5.11)
RDW: 14.2 % (ref 11.5–15.5)
WBC: 2.8 10*3/uL — AB (ref 4.0–10.5)

## 2015-02-22 LAB — COMPREHENSIVE METABOLIC PANEL
ALBUMIN: 4.2 g/dL (ref 3.5–5.2)
ALK PHOS: 56 U/L (ref 39–117)
ALT: 19 U/L (ref 0–35)
AST: 17 U/L (ref 0–37)
BUN: 18 mg/dL (ref 6–23)
CHLORIDE: 104 meq/L (ref 96–112)
CO2: 28 mEq/L (ref 19–32)
CREATININE: 0.77 mg/dL (ref 0.40–1.20)
Calcium: 9.5 mg/dL (ref 8.4–10.5)
GFR: 76.71 mL/min (ref >60.00)
Glucose, Bld: 92 mg/dL (ref 70–99)
POTASSIUM: 3.9 meq/L (ref 3.5–5.1)
Sodium: 137 mEq/L (ref 135–145)
TOTAL PROTEIN: 7 g/dL (ref 6.0–8.3)
Total Bilirubin: 1.3 mg/dL — ABNORMAL HIGH (ref 0.2–1.2)

## 2015-02-22 NOTE — Telephone Encounter (Signed)
Left voice mail to call back 

## 2015-02-27 NOTE — Telephone Encounter (Signed)
Spoke with patient and went over her meds and dosage and made copies of last office note to give to [name] that works upstairs in the [facility], ok per mom to do.  Made [name] a November f/u appt with Dr Carlean Purl.

## 2015-04-11 ENCOUNTER — Ambulatory Visit (INDEPENDENT_AMBULATORY_CARE_PROVIDER_SITE_OTHER): Payer: Medicare HMO | Admitting: Internal Medicine

## 2015-04-11 ENCOUNTER — Encounter: Payer: Self-pay | Admitting: Internal Medicine

## 2015-04-11 ENCOUNTER — Other Ambulatory Visit (INDEPENDENT_AMBULATORY_CARE_PROVIDER_SITE_OTHER): Payer: Medicare HMO

## 2015-04-11 VITALS — BP 130/80 | HR 64 | Ht 61.0 in | Wt 107.0 lb

## 2015-04-11 DIAGNOSIS — E210 Primary hyperparathyroidism: Secondary | ICD-10-CM | POA: Diagnosis not present

## 2015-04-11 DIAGNOSIS — E559 Vitamin D deficiency, unspecified: Secondary | ICD-10-CM

## 2015-04-11 DIAGNOSIS — K5000 Crohn's disease of small intestine without complications: Secondary | ICD-10-CM | POA: Diagnosis not present

## 2015-04-11 LAB — VITAMIN D 25 HYDROXY (VIT D DEFICIENCY, FRACTURES): VITD: 35.39 ng/mL (ref 30.00–100.00)

## 2015-04-11 NOTE — Patient Instructions (Signed)
Your physician has requested that you have labs drawn today.  We will contact you with the results and plans.     I appreciate the opportunity to care for you. Silvano Rusk, MD, [facility]

## 2015-04-11 NOTE — Progress Notes (Signed)
   Subjective:    Patient ID: [name], female    DOB: 1932-06-12, 81 y.o.   MRN: [id number] Cc: f/u Crohn's HPI No GI c/o Pesary back in and helps bladder prolapse Wondering about which Ca supplement she should be on Medications, allergies, past medical history, past surgical history, family history and social history are reviewed and updated in the EMR.   Review of Systems As above    Objective:   Physical Exam  @BP  130/80 mmHg  Pulse 64  Ht 5\' 1"  (1.549 m)  Wt 107 lb (48.535 kg)  BMI 20.23 kg/m2@  General:  NAD, elderly Eyes:   anicteric Lungs:  clear Heart:: S1S2 no rubs, murmurs or gallops Abdomen:  soft and nontender, BS+ Ext:   no edema, cyanosis or clubbing    Data Reviewed:   Prior labs in 2015-16 Lab Results  Component Value Date   WBC 2.8* 02/22/2015   HGB 12.6 02/22/2015   HCT 36.7 02/22/2015   MCV 110.7* 02/22/2015   PLT 235.0 02/22/2015   Lab Results  Component Value Date   ALT 19 02/22/2015   AST 17 02/22/2015   ALKPHOS 56 02/22/2015   BILITOT 1.3* 02/22/2015        Assessment & Plan:  Crohn's disease of small intestine without complication (HCC)  Primary hyperparathyroidism ([place]) - Plan: PTH, Intact and Calcium, Vitamin D (25 hydroxy)  Vitamin D deficiency - Plan: PTH, Intact and Calcium, Vitamin D (25 hydroxy)  I checked vit D, Ca and PTH and these were stable and ok (see EMR) Communicated w/ Dr. Damita Dunnings whe will be seeing her for Medicare physical and he will f/u She will see me in about 6 months routine and continue w/ q 3-4 month LFT/CBC

## 2015-04-12 LAB — PTH, INTACT AND CALCIUM
Calcium: 9.2 mg/dL (ref 8.4–10.5)
PTH: 77 pg/mL — ABNORMAL HIGH (ref 14–64)

## 2015-04-12 NOTE — Progress Notes (Signed)
Quick Note:  She was in to see me and had ? About vit D etc and had not had PTH and Ca checked in a while I am thinking this should just be rechecked at some point but want your opinion and help  Thanks CEG ______

## 2015-04-13 ENCOUNTER — Other Ambulatory Visit: Payer: Self-pay | Admitting: Internal Medicine

## 2015-04-13 MED ORDER — MERCAPTOPURINE 50 MG PO TABS: ORAL_TABLET | ORAL | Status: DC

## 2015-04-13 NOTE — Telephone Encounter (Signed)
6MP refill sent in as requested. Barb Merino, RN, CGRN just spoke with patient and told her we would send in rx.  Sheri confirmed the pharmacy.  Sheri discussed vitamins with her so no Vitamin D sent in.

## 2015-04-13 NOTE — Progress Notes (Signed)
Quick Note:  let her know Ca and Vit all ok  She is ok taking the Ca and vit D her daughter got her ______

## 2015-04-14 ENCOUNTER — Telehealth: Payer: Self-pay | Admitting: Family Medicine

## 2015-04-14 NOTE — Telephone Encounter (Signed)
Spoke with pt about scheduling her cpx she stated she could not schedule an appointment @ this time  She stated she would call back after christmas to schedule appointment

## 2015-04-15 ENCOUNTER — Other Ambulatory Visit: Payer: Self-pay | Admitting: Internal Medicine

## 2015-04-26 ENCOUNTER — Telehealth: Payer: Self-pay | Admitting: Family Medicine

## 2015-04-26 NOTE — Telephone Encounter (Signed)
-----   Message from Arletha Grippe sent at 04/25/2015  3:51 PM EST ----- Pt stated that she did not want to set up an appt since [place] is so far away and she sees so many different doctors.  She has seen Dr Carlean Purl on November 1st.  ----- Message -----    From: Tammi Sou, CMA    Sent: 04/13/2015   8:47 AM      To: Daylene Katayama, Arletha Grippe, #  See prev note from Dr. Damita Dunnings, can one of you all please set up this pt's CPE appt., thanks! ----- Message -----    From: Tonia Ghent, MD    Sent: 04/13/2015   5:57 AM      To: Josetta Huddle, CMA  Patient is due for CPE.  Please try to set that up.  Thanks.

## 2015-04-26 NOTE — Telephone Encounter (Signed)
That is actually the point- "she sees so many different doctors" and therefore needs a CPE set up to try to coordinate her care.  But we can't make her come in.

## 2015-05-17 DIAGNOSIS — Z09 Encounter for follow-up examination after completed treatment for conditions other than malignant neoplasm: Secondary | ICD-10-CM | POA: Diagnosis not present

## 2015-05-17 DIAGNOSIS — R399 Unspecified symptoms and signs involving the genitourinary system: Secondary | ICD-10-CM | POA: Diagnosis not present

## 2015-05-17 DIAGNOSIS — N813 Complete uterovaginal prolapse: Secondary | ICD-10-CM | POA: Diagnosis not present

## 2015-05-18 ENCOUNTER — Other Ambulatory Visit: Payer: Self-pay | Admitting: Internal Medicine

## 2015-05-22 ENCOUNTER — Encounter: Payer: Self-pay | Admitting: *Deleted

## 2015-07-14 ENCOUNTER — Ambulatory Visit (INDEPENDENT_AMBULATORY_CARE_PROVIDER_SITE_OTHER): Payer: Medicare HMO | Admitting: Internal Medicine

## 2015-07-14 ENCOUNTER — Telehealth: Payer: Self-pay | Admitting: Family Medicine

## 2015-07-14 VITALS — BP 150/86 | HR 65 | Temp 98.5°F | Wt 104.0 lb

## 2015-07-14 DIAGNOSIS — R350 Frequency of micturition: Secondary | ICD-10-CM

## 2015-07-14 DIAGNOSIS — R42 Dizziness and giddiness: Secondary | ICD-10-CM

## 2015-07-14 DIAGNOSIS — R8290 Unspecified abnormal findings in urine: Secondary | ICD-10-CM

## 2015-07-14 LAB — POC URINALSYSI DIPSTICK (AUTOMATED)
Blood, UA: NEGATIVE
GLUCOSE UA: NEGATIVE
NITRITE UA: POSITIVE
PH UA: 6
Spec Grav, UA: >=1.03
Urobilinogen, UA: NEGATIVE

## 2015-07-14 NOTE — Patient Instructions (Signed)

## 2015-07-14 NOTE — Telephone Encounter (Signed)
Called # and daughter's # on file, no answer so left voicemail requesting them to call office back, routed to Dr. Josefine Class assistant

## 2015-07-14 NOTE — Telephone Encounter (Signed)
[facility]  Patient Name: [name]  DOB: 1933/04/04    Initial Comment Caller states mother has been feeling dizzy. Bp 200/100 last night and vomited. Dtr thinks she has a UTI, urine is very strong   Nurse Assessment  Nurse: Wynetta Emery, RN, Baker Janus Date/Time (Eastern Time): 07/14/2015 8:24:35 AM  Confirm and document reason for call. If symptomatic, describe symptoms. You must click the next button to save text entered. ---Miral 200/100 194/92 (right arm) 65 weak and urine is strong and dizziness fells nauseated.  Has the patient traveled out of the country within the last 30 days? ---No  Does the patient have any new or worsening symptoms? ---Yes  Will a triage be completed? ---Yes  Related visit to physician within the last 2 weeks? ---No  Does the PT have any chronic conditions? (i.e. diabetes, asthma, etc.) ---Unknown  Is this a behavioral health or substance abuse call? ---No     Guidelines    Guideline Title Affirmed Question Affirmed Notes  High Blood Pressure [1] BP ? 200/120 AND [2] having NO cardiac or neurologic symptoms    Final Disposition User   See Physician within 4 Hours (or PCP triage) Wynetta Emery, RN, Baker Janus    Comments  NOTE; ATTEMPTED TO MAKE APPT WITH DR. Damita Dunnings FOR TODAY NO AVAILABLE SLOTS OPEN AFTER MAKING APPT WITH [place], NP AT [place] PM TODAY DR. Damita Dunnings EVIDENTLY HAD A CANCELLATION AT 300PM NOT SURE TO RESCHEDULE TO DR. [place].   Referrals  REFERRED TO PCP OFFICE   Disagree/Comply: Comply

## 2015-07-14 NOTE — Telephone Encounter (Signed)
Please call pt. The issue is whether or not she should be seen now vs later.   If BP really is that high, then advise UC.  If emergent sx (SOB, etc) to ER now.  Thanks.

## 2015-07-14 NOTE — Telephone Encounter (Signed)
appt with Baity today at 4:15, sent to PCP with question if pt should wait that long

## 2015-07-14 NOTE — Progress Notes (Signed)
HPI  Pt presents to the clinic today with c/o urinary frequency, dizziness and high blood pressure. She reports this started las night. She was not feeling well, so she had her friend check her BP and it was 200/100. They rechecked it a few minutes later and it was 194/92. Her daughter reports that she has a bad cystocele and was supposed to have it repaired, but the surgery was cancelled. The daughter reports a foul odor to her mother's urine. She denies fever, chills or body aches. She has had one episode of nausea and vomiting but is not sure if it is related.   Review of Systems  Past Medical History  Diagnosis Date  . Crohn's disease ([place])     remission  . Bladder prolapse, female, acquired     pessary in  . Osteoporosis   . Pubic ramus fracture ([place])   . Hyperparathyroidism   . Vitamin D deficiency   . History of hypercalcemia   . History of hyperglycemia   . Hydronephrosis   . Urinary incontinence 09/09/2011  . Complete uterine prolapse   . E. coli UTI 06/09/2013  . Fall 05/2013  . Multiple pelvic fractures ([place]) 07/02/2013  . Hemorrhoids   . Diverticulosis     Family History  Problem Relation Age of Onset  . Heart disease Father   . Colon cancer Father   . Osteoporosis Mother   . Breast cancer Mother   . Prostate cancer Brother     Social History   Social History  . Marital Status: Widowed    Spouse Name: N/A  . Number of Children: 3  . Years of Education: N/A   Occupational History  . Retired    Social History Main Topics  . Smoking status: Never Smoker   . Smokeless tobacco: Never Used  . Alcohol Use: No     Comment: wine  . Drug Use: No  . Sexual Activity: No     Comment: because of prolapse and pessary   Other Topics Concern  . Not on file   Social History Narrative   Widowed.  Lives with a housemate.     3 kids.     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    No Known  Allergies  Constitutional, frequency and pain with urination: Denies fever, malaise, fatigue, headache or abrupt weight changes.   GU: Pt reports frequency and urine odor. Denies urgency, dysuria, burning sensation, blood in urine, or discharge. Skin: Denies redness, rashes, lesions or ulcercations.   No other specific complaints in a complete review of systems (except as listed in HPI above).    Objective:   Physical Exam  BP 150/86 mmHg  Pulse 65  Temp(Src) 98.5 F (36.9 C) (Oral)  Wt 104 lb (47.174 kg)  SpO2 98% Wt Readings from Last 3 Encounters:  07/14/15 104 lb (47.174 kg)  04/11/15 107 lb (48.535 kg)  01/09/15 105 lb 8 oz (47.854 kg)    General: Appears her stated age,  in NAD. Cardiovascular: Normal rate and rhythm. S1,S2 noted.   Pulmonary/Chest: Normal effort and positive vesicular breath sounds. No respiratory distress. No wheezes, rales or ronchi noted.  Abdomen: Soft. Normal bowel sounds. No distention or masses noted.  Tender to palpation over the bladder area. No CVA tenderness.      Assessment & Plan:  Frequency and urine odor:  Urinalysis: 3+ leuks, pos ketones Will send urine  culture She already had a RX for Macrobid 100 mg BID x 5 days called in by her urologist OK to take AZO OTC Drink plenty of fluids  RTC as needed or if symptoms persist.

## 2015-07-14 NOTE — Telephone Encounter (Signed)
Phoned the patient who was "sitting in the chair at the beauty shop".  She is not experiencing any SOB, just the high BP reading last evening.  Pt will keep the appt this afternoon with [facility].

## 2015-07-16 ENCOUNTER — Encounter: Payer: Self-pay | Admitting: Internal Medicine

## 2015-07-16 LAB — URINE CULTURE: Colony Count: >=100000

## 2015-07-28 ENCOUNTER — Encounter: Payer: Self-pay | Admitting: Internal Medicine

## 2015-07-31 ENCOUNTER — Other Ambulatory Visit: Payer: Self-pay | Admitting: Internal Medicine

## 2015-08-09 DIAGNOSIS — Z09 Encounter for follow-up examination after completed treatment for conditions other than malignant neoplasm: Secondary | ICD-10-CM | POA: Diagnosis not present

## 2015-08-09 DIAGNOSIS — N813 Complete uterovaginal prolapse: Secondary | ICD-10-CM | POA: Diagnosis not present

## 2015-08-09 DIAGNOSIS — R8290 Unspecified abnormal findings in urine: Secondary | ICD-10-CM | POA: Diagnosis not present

## 2015-08-10 ENCOUNTER — Encounter: Payer: Self-pay | Admitting: *Deleted

## 2015-09-20 DIAGNOSIS — N3281 Overactive bladder: Secondary | ICD-10-CM | POA: Insufficient documentation

## 2015-09-20 DIAGNOSIS — N813 Complete uterovaginal prolapse: Secondary | ICD-10-CM | POA: Diagnosis not present

## 2015-09-20 DIAGNOSIS — R399 Unspecified symptoms and signs involving the genitourinary system: Secondary | ICD-10-CM | POA: Diagnosis not present

## 2015-09-21 ENCOUNTER — Encounter: Payer: Self-pay | Admitting: *Deleted

## 2015-10-05 DIAGNOSIS — N3281 Overactive bladder: Secondary | ICD-10-CM | POA: Diagnosis not present

## 2015-10-05 DIAGNOSIS — N813 Complete uterovaginal prolapse: Secondary | ICD-10-CM | POA: Diagnosis not present

## 2015-10-06 ENCOUNTER — Encounter: Payer: Self-pay | Admitting: *Deleted

## 2015-10-10 ENCOUNTER — Other Ambulatory Visit: Payer: Self-pay | Admitting: Internal Medicine

## 2015-10-10 DIAGNOSIS — K5000 Crohn's disease of small intestine without complications: Secondary | ICD-10-CM

## 2015-10-10 NOTE — Telephone Encounter (Signed)
How many refills Sir? Thank you. 

## 2015-10-10 NOTE — Telephone Encounter (Signed)
Lab orders placed and patient aware.

## 2015-10-10 NOTE — Telephone Encounter (Signed)
Refill x 1  Needs CBC and CMEt please

## 2015-11-01 DIAGNOSIS — Z09 Encounter for follow-up examination after completed treatment for conditions other than malignant neoplasm: Secondary | ICD-10-CM | POA: Diagnosis not present

## 2015-11-01 DIAGNOSIS — R8290 Unspecified abnormal findings in urine: Secondary | ICD-10-CM | POA: Diagnosis not present

## 2015-11-01 DIAGNOSIS — N813 Complete uterovaginal prolapse: Secondary | ICD-10-CM | POA: Diagnosis not present

## 2015-11-15 ENCOUNTER — Other Ambulatory Visit: Payer: Self-pay

## 2015-11-15 ENCOUNTER — Other Ambulatory Visit (INDEPENDENT_AMBULATORY_CARE_PROVIDER_SITE_OTHER): Payer: Medicare HMO

## 2015-11-15 DIAGNOSIS — K5000 Crohn's disease of small intestine without complications: Secondary | ICD-10-CM | POA: Diagnosis not present

## 2015-11-15 DIAGNOSIS — K50119 Crohn's disease of large intestine with unspecified complications: Secondary | ICD-10-CM

## 2015-11-15 DIAGNOSIS — N813 Complete uterovaginal prolapse: Secondary | ICD-10-CM | POA: Diagnosis not present

## 2015-11-15 DIAGNOSIS — N3281 Overactive bladder: Secondary | ICD-10-CM | POA: Diagnosis not present

## 2015-11-15 LAB — COMPREHENSIVE METABOLIC PANEL
ALBUMIN: 4.3 g/dL (ref 3.5–5.2)
ALK PHOS: 68 U/L (ref 39–117)
ALT: 39 U/L — ABNORMAL HIGH (ref 0–35)
AST: 25 U/L (ref 0–37)
BUN: 29 mg/dL — AB (ref 6–23)
CALCIUM: 9.9 mg/dL (ref 8.4–10.5)
CHLORIDE: 104 meq/L (ref 96–112)
CO2: 30 mEq/L (ref 19–32)
Creatinine, Ser: 0.66 mg/dL (ref 0.40–1.20)
GFR: 91.48 mL/min (ref >60.00)
Glucose, Bld: 96 mg/dL (ref 70–99)
POTASSIUM: 4.1 meq/L (ref 3.5–5.1)
SODIUM: 139 meq/L (ref 135–145)
TOTAL PROTEIN: 7.2 g/dL (ref 6.0–8.3)
Total Bilirubin: 1.1 mg/dL (ref 0.2–1.2)

## 2015-11-15 LAB — CBC WITH DIFFERENTIAL/PLATELET
BASOS PCT: 0.5 % (ref 0.0–3.0)
Basophils Absolute: 0 10*3/uL (ref 0.0–0.1)
EOS PCT: 2.2 % (ref 0.0–5.0)
Eosinophils Absolute: 0.1 10*3/uL (ref 0.0–0.7)
HCT: 39.2 % (ref 36.0–46.0)
HEMOGLOBIN: 13.6 g/dL (ref 12.0–15.0)
Lymphocytes Relative: 14.2 % (ref 12.0–46.0)
Lymphs Abs: 0.5 10*3/uL — ABNORMAL LOW (ref 0.7–4.0)
MCHC: 34.7 g/dL (ref 30.0–36.0)
MCV: 107.7 fl — AB (ref 78.0–100.0)
MONO ABS: 0.3 10*3/uL (ref 0.1–1.0)
MONOS PCT: 8.2 % (ref 3.0–12.0)
Neutro Abs: 2.5 10*3/uL (ref 1.4–7.7)
Neutrophils Relative %: 74.9 % (ref 43.0–77.0)
Platelets: 272 10*3/uL (ref 150.0–400.0)
RBC: 3.64 Mil/uL — ABNORMAL LOW (ref 3.87–5.11)
RDW: 15.9 % — AB (ref 11.5–15.5)
WBC: 3.3 10*3/uL — AB (ref 4.0–10.5)

## 2015-11-15 NOTE — Progress Notes (Signed)
Quick Note:  Labs are fine Hoe she is ok Repeat same labs in 4 months See me in august or sept - need to coordinate w/ daughter [name] ______

## 2015-11-18 ENCOUNTER — Telehealth: Payer: Self-pay

## 2015-11-18 NOTE — Telephone Encounter (Signed)
Patient is on the list for [employer] 2017 and may be a good candidate for an AWV in 2017. Please let me know if/when appt is scheduled.   

## 2015-11-20 NOTE — Telephone Encounter (Signed)
Attempted to reach pt to schedule AWV. Left message with contact info.

## 2015-12-05 ENCOUNTER — Ambulatory Visit: Payer: Medicare HMO

## 2015-12-06 ENCOUNTER — Ambulatory Visit: Payer: Medicare HMO

## 2015-12-08 ENCOUNTER — Ambulatory Visit (INDEPENDENT_AMBULATORY_CARE_PROVIDER_SITE_OTHER): Payer: Medicare HMO

## 2015-12-08 VITALS — BP 122/76 | HR 67 | Temp 98.5°F | Ht 61.5 in | Wt 107.2 lb

## 2015-12-08 DIAGNOSIS — Z0000 Encounter for general adult medical examination without abnormal findings: Secondary | ICD-10-CM | POA: Diagnosis not present

## 2015-12-08 NOTE — Patient Instructions (Signed)
[name] , Thank you for taking time to come for your Medicare Wellness Visit. I appreciate your ongoing commitment to your health goals. Please review the following plan we discussed and let me know if I can assist you in the future.   These are the goals we discussed: Goals    . Increase physical activity     Starting 12/08/2015, I will continue to walk on my driveway at least once daily.        This is a list of the screening recommended for you and due dates:  Health Maintenance  Topic Date Due  . DEXA scan (bone density measurement)  12/07/2025*  . Shingles Vaccine  12/07/2025*  . Tetanus Vaccine  12/07/2025*  . Pneumonia vaccines (1 of 2 - PCV13) 12/07/2025*  . Flu Shot  01/09/2016  *Topic was postponed. The date shown is not the original due date.   Preventive Care for Adults  A healthy lifestyle and preventive care can promote health and wellness. Preventive health guidelines for adults include the following key practices.  . A routine yearly physical is a good way to check with your health care provider about your health and preventive screening. It is a chance to share any concerns and updates on your health and to receive a thorough exam.  . Visit your dentist for a routine exam and preventive care every 6 months. Brush your teeth twice a day and floss once a day. Good oral hygiene prevents tooth decay and gum disease.  . The frequency of eye exams is based on your age, health, family medical history, use  of contact lenses, and other factors. Follow your health care provider's ecommendations for frequency of eye exams.  . Eat a healthy diet. Foods like vegetables, fruits, whole grains, low-fat dairy products, and lean protein foods contain the nutrients you need without too many calories. Decrease your intake of foods high in solid fats, added sugars, and salt. Eat the right amount of calories for you. Get information about a proper diet from your health care provider, if  necessary.  . Regular physical exercise is one of the most important things you can do for your health. Most adults should get at least 150 minutes of moderate-intensity exercise (any activity that increases your heart rate and causes you to sweat) each week. In addition, most adults need muscle-strengthening exercises on 2 or more days a week.  Silver Sneakers may be a benefit available to you. To determine eligibility, you may visit the website: www.silversneakers.com or contact program at (819)311-0783 Mon-Fri between 8AM-8PM.   . Maintain a healthy weight. The body mass index (BMI) is a screening tool to identify possible weight problems. It provides an estimate of body fat based on height and weight. Your health care provider can find your BMI and can help you achieve or maintain a healthy weight.   For adults 20 years and older: ? A BMI below 18.5 is considered underweight. ? A BMI of 18.5 to 24.9 is normal. ? A BMI of 25 to 29.9 is considered overweight. ? A BMI of 30 and above is considered obese.   . Maintain normal blood lipids and cholesterol levels by exercising and minimizing your intake of saturated fat. Eat a balanced diet with plenty of fruit and vegetables. Blood tests for lipids and cholesterol should begin at age 31 and be repeated every 5 years. If your lipid or cholesterol levels are high, you are over 50, or you are at high  risk for heart disease, you may need your cholesterol levels checked more frequently. Ongoing high lipid and cholesterol levels should be treated with medicines if diet and exercise are not working.  . If you smoke, find out from your health care provider how to quit. If you do not use tobacco, please do not start.  . If you choose to drink alcohol, please do not consume more than 2 drinks per day. One drink is considered to be 12 ounces (355 mL) of beer, 5 ounces (148 mL) of wine, or 1.5 ounces (44 mL) of liquor.  . If you are 10-62 years old, ask your  health care provider if you should take aspirin to prevent strokes.  . Use sunscreen. Apply sunscreen liberally and repeatedly throughout the day. You should seek shade when your shadow is shorter than you. Protect yourself by wearing long sleeves, pants, a wide-brimmed hat, and sunglasses year round, whenever you are outdoors.  . Once a month, do a whole body skin exam, using a mirror to look at the skin on your back. Tell your health care provider of new moles, moles that have irregular borders, moles that are larger than a pencil eraser, or moles that have changed in shape or color.

## 2015-12-10 NOTE — Progress Notes (Signed)
PCP notes:  Health maintenance:  DEXA scan - pt declined Shingles - pt declined Tetanus - pt declined PCV13 - pt declined  Abnormal screenings:   Hearing - failed  Patient concerns: None  Nurse concerns: None  Next PCP appt: None; pt declined to schedule an appt at this time  I reviewed health advisor's note, was available for consultation on the day of service listed in this note, and agree with documentation and plan. Elsie Stain, MD.

## 2015-12-10 NOTE — Progress Notes (Signed)
Pre visit review using our clinic review tool, if applicable. No additional management support is needed unless otherwise documented below in the visit note. 

## 2015-12-10 NOTE — Progress Notes (Signed)
Subjective:   [name] is a 82 y.o. female who presents for an Initial Medicare Annual Wellness Visit.  Review of Systems  N/A Cardiac Risk Factors include: advanced age (>23mn, >>84women);sedentary lifestyle    Objective:    Today's Vitals   12/08/15 1453  BP: 122/76  Pulse: 67  Temp: 98.5 F (36.9 C)  TempSrc: Oral  Height: 5' 1.5" (1.562 m)  Weight: 107 lb 4 oz (48.648 kg)  SpO2: 97%  PainSc: 0-No pain   Body mass index is 19.94 kg/(m^2).  Current Medications (verified) Outpatient Encounter Prescriptions as of 12/08/2015  Medication Sig  . calcium-vitamin D (OSCAL WITH D) 500-200 MG-UNIT tablet Take 1 tablet by mouth 2 (two) times daily.  .Marland Kitchenestrogens, conjugated, (PREMARIN) 0.625 MG tablet Take 0.625 mg by mouth daily. Take daily for 21 days then do not take for 7 days.  .Marland Kitchenmercaptopurine (PURINETHOL) 50 MG tablet TAKE 1&1/2 TABLETS BY MOUTH ONCE A DAY  . mirabegron ER (MYRBETRIQ) 50 MG TB24 tablet Take 50 mg by mouth daily.  . [DISCONTINUED] calcium carbonate (OS-CAL) 600 MG tablet Take 1 tablet (600 mg total) by mouth 2 (two) times daily with a meal.  . [DISCONTINUED] cholecalciferol (VITAMIN D) 1000 UNITS tablet Take 1 tablet (1,000 Units total) by mouth daily.  . [DISCONTINUED] mercaptopurine (PURINETHOL) 50 MG tablet TAKE 1&1/2 TABLETS BY MOUTH ONCE A DAY   No facility-administered encounter medications on file as of 12/08/2015.    Allergies (verified) Review of patient's allergies indicates no known allergies.   History: Past Medical History  Diagnosis Date  . [place] ([facility]     remission  . Bladder prolapse, female, acquired     pessary in  . Osteoporosis   . Pubic ramus fracture ([place]   . Hyperparathyroidism   . Vitamin D deficiency   . History of hypercalcemia   . History of hyperglycemia   . Hydronephrosis   . Urinary incontinence 09/09/2011  . Complete uterine prolapse   . E. coli UTI 06/09/2013  . Fall 05/2013  . Multiple pelvic  fractures ([place] 07/02/2013  . Hemorrhoids   . Diverticulosis    Past Surgical History  Procedure Laterality Date  . Hip fracture surgery Left     left, prosthesis  . Rhinoplasty    . Tonsillectomy    . Laparoscopy  10/2009  . Colonoscopy  12/23/2008    crohn's, diverticulosis, internal hemorrhoids  . Parathyroidectomy Left 08/30/2013    Procedure: LEFT INFERIOR PARATHYROIDECTOMY;  Surgeon: TEarnstine Regal MD;  Location: [facility];  Service: General;  Laterality: Left;   Family History  Problem Relation Age of Onset  . Heart disease Father   . Colon cancer Father   . Osteoporosis Mother   . Breast cancer Mother   . Prostate cancer Brother    Social History   Occupational History  . Retired    Social History Main Topics  . Smoking status: Never Smoker   . Smokeless tobacco: Never Used  . Alcohol Use: No     Comment: wine  . Drug Use: No  . Sexual Activity: No     Comment: because of prolapse and pessary   Tobacco Counseling Counseling given: No   Activities of Daily Living In your present state of health, do you have any difficulty performing the following activities: 12/08/2015  Hearing? N  Vision? Y  Difficulty concentrating or making decisions? N  Walking or climbing stairs? N  Dressing or bathing? N  Doing errands, shopping? Y  Preparing Food and eating ? N  Using the Toilet? N  In the past six months, have you accidently leaked urine? Y  Do you have problems with loss of bowel control? N  Managing your Medications? N  Managing your Finances? N  Housekeeping or managing your Housekeeping? N    Immunizations and Health Maintenance Immunization History  Administered Date(s) Administered  . Influenza Split 05/27/2011   There are no preventive care reminders to display for this patient.  Patient Care Team: Tonia Ghent, MD as PCP - General (Family Medicine) Gatha Mayer, MD as Consulting Physician (Gastroenterology) Marti Sleigh, MD as Consulting  Physician (Gynecology)    Assessment:   This is a routine wellness examination for [place].   Hearing/Vision screen  Hearing Screening   125Hz  250Hz  500Hz  1000Hz  2000Hz  4000Hz  8000Hz   Right ear:   0 40 40 40   Left ear:   0 40 40 40     Visual Acuity Screening   Right eye Left eye Both eyes  Without correction: 20/50 20/50-1 20/40-1  With correction:       Dietary issues and exercise activities discussed: Current Exercise Habits: The patient does not participate in regular exercise at present (pt reports she walks down driveway daily), Exercise limited by: None identified  Goals    . Increase physical activity     Starting 12/08/2015, I will continue to walk on my driveway at least once daily.       Depression Screen PHQ 2/9 Scores 12/08/2015  PHQ - 2 Score 0    Fall Risk Fall Risk  12/08/2015 02/07/2014  Falls in the past year? No Yes  Number falls in past yr: - 1  Injury with Fall? - Yes  Risk Factor Category  - High Fall Risk  Risk for fall due to : - Other (Comment)  Risk for fall due to (comments): - uses a cane now, had hip fracture after fall last year and surgery    Cognitive Function: MMSE - Mini Mental State Exam 12/08/2015  Orientation to time 5  Orientation to Place 5  Registration 3  Attention/ Calculation 0  Recall 3  Language- name 2 objects 0  Language- repeat 1  Language- follow 3 step command 3  Language- read & follow direction 0  Write a sentence 0  Copy design 0  Total score 20   PLEASE NOTE: A Mini-Cog screen was completed. Maximum score is 20. A value of 0 denotes this part of [place] MMSE was not completed or the patient failed this part of the Mini-Cog screening.   Mini-Cog Screening Orientation to Time - Max 5 pts Orientation to Place - Max 5 pts Registration - Max 3 pts Recall - Max 3 pts Language Repeat - Max 1 pts Language Follow 3 Step Command - Max 3 pts  Screening Tests Health Maintenance  Topic Date Due  . DEXA SCAN   12/07/2025 (Originally 05/25/2000)  . ZOSTAVAX  12/07/2025 (Originally 05/26/1995)  . TETANUS/TDAP  12/07/2025 (Originally 05/25/1954)  . PNA vac Low Risk Adult (1 of 2 - PCV13) 12/07/2025 (Originally 05/25/2000)  . INFLUENZA VACCINE  01/09/2016        Plan:    I have personally reviewed and addressed the Medicare Annual Wellness questionnaire and have noted the following in the patient's chart:  A. Medical and social history B. Use of alcohol, tobacco or illicit drugs  C. Current medications and supplements D. Functional ability  and status E.  Nutritional status F.  Physical activity G. Advance directives H. List of other physicians I.  Hospitalizations, surgeries, and ER visits in previous 12 months J.  [place] to include hearing, vision, cognitive, depression L. Referrals and appointments - none  In addition, I have reviewed and discussed with patient certain preventive protocols, quality metrics, and best practice recommendations. A written personalized care plan for preventive services as well as general preventive health recommendations were provided to patient.  See attached scanned questionnaire for additional information.   Signed,   Lindell Noe, MHA, BS, LPN Health Advisor

## 2015-12-13 NOTE — Telephone Encounter (Signed)
Pt had AWV on 12/08/15.

## 2015-12-14 ENCOUNTER — Other Ambulatory Visit: Payer: Self-pay | Admitting: Internal Medicine

## 2015-12-26 ENCOUNTER — Encounter: Payer: Self-pay | Admitting: Internal Medicine

## 2016-01-24 DIAGNOSIS — R8290 Unspecified abnormal findings in urine: Secondary | ICD-10-CM | POA: Diagnosis not present

## 2016-01-24 DIAGNOSIS — N3281 Overactive bladder: Secondary | ICD-10-CM | POA: Diagnosis not present

## 2016-01-24 DIAGNOSIS — N813 Complete uterovaginal prolapse: Secondary | ICD-10-CM | POA: Diagnosis not present

## 2016-01-24 DIAGNOSIS — N390 Urinary tract infection, site not specified: Secondary | ICD-10-CM | POA: Diagnosis not present

## 2016-02-27 DIAGNOSIS — Z0000 Encounter for general adult medical examination without abnormal findings: Secondary | ICD-10-CM | POA: Diagnosis not present

## 2016-02-27 DIAGNOSIS — E210 Primary hyperparathyroidism: Secondary | ICD-10-CM | POA: Diagnosis not present

## 2016-02-27 DIAGNOSIS — K5000 Crohn's disease of small intestine without complications: Secondary | ICD-10-CM | POA: Diagnosis not present

## 2016-02-28 ENCOUNTER — Other Ambulatory Visit: Payer: Self-pay | Admitting: Internal Medicine

## 2016-04-24 DIAGNOSIS — N3281 Overactive bladder: Secondary | ICD-10-CM | POA: Diagnosis not present

## 2016-04-24 DIAGNOSIS — N813 Complete uterovaginal prolapse: Secondary | ICD-10-CM | POA: Diagnosis not present

## 2016-04-24 DIAGNOSIS — N390 Urinary tract infection, site not specified: Secondary | ICD-10-CM | POA: Diagnosis not present

## 2016-04-25 ENCOUNTER — Other Ambulatory Visit: Payer: Self-pay | Admitting: Internal Medicine

## 2016-04-25 DIAGNOSIS — K5000 Crohn's disease of small intestine without complications: Secondary | ICD-10-CM

## 2016-04-25 NOTE — Telephone Encounter (Signed)
Patient informed and will come for blood work soon. Orders put in.

## 2016-04-25 NOTE — Telephone Encounter (Signed)
How many refills Sir, thank you. 

## 2016-04-25 NOTE — Telephone Encounter (Signed)
OK to refill x 4 mos but she also needs to come in for recheck CBC and LFT

## 2016-06-12 DIAGNOSIS — N3281 Overactive bladder: Secondary | ICD-10-CM | POA: Diagnosis not present

## 2016-06-12 DIAGNOSIS — N3000 Acute cystitis without hematuria: Secondary | ICD-10-CM | POA: Diagnosis not present

## 2016-06-12 DIAGNOSIS — N390 Urinary tract infection, site not specified: Secondary | ICD-10-CM | POA: Diagnosis not present

## 2016-06-12 DIAGNOSIS — N813 Complete uterovaginal prolapse: Secondary | ICD-10-CM | POA: Diagnosis not present

## 2016-06-20 ENCOUNTER — Encounter: Payer: Self-pay | Admitting: Internal Medicine

## 2016-06-20 ENCOUNTER — Encounter (INDEPENDENT_AMBULATORY_CARE_PROVIDER_SITE_OTHER): Payer: Self-pay

## 2016-06-20 ENCOUNTER — Ambulatory Visit (INDEPENDENT_AMBULATORY_CARE_PROVIDER_SITE_OTHER): Payer: Medicare HMO | Admitting: Internal Medicine

## 2016-06-20 ENCOUNTER — Other Ambulatory Visit (INDEPENDENT_AMBULATORY_CARE_PROVIDER_SITE_OTHER): Payer: Medicare HMO

## 2016-06-20 DIAGNOSIS — N8110 Cystocele, unspecified: Secondary | ICD-10-CM

## 2016-06-20 DIAGNOSIS — Z79899 Other long term (current) drug therapy: Secondary | ICD-10-CM

## 2016-06-20 DIAGNOSIS — K5000 Crohn's disease of small intestine without complications: Secondary | ICD-10-CM

## 2016-06-20 DIAGNOSIS — Z7960 Long term (current) use of unspecified immunomodulators and immunosuppressants: Secondary | ICD-10-CM

## 2016-06-20 LAB — CBC WITH DIFFERENTIAL/PLATELET
Basophils Absolute: 0 10*3/uL (ref 0.0–0.1)
Basophils Relative: 0.6 % (ref 0.0–3.0)
EOS PCT: 2.5 % (ref 0.0–5.0)
Eosinophils Absolute: 0.1 10*3/uL (ref 0.0–0.7)
HCT: 42 % (ref 36.0–46.0)
HEMOGLOBIN: 14.7 g/dL (ref 12.0–15.0)
LYMPHS ABS: 0.6 10*3/uL — AB (ref 0.7–4.0)
Lymphocytes Relative: 14.7 % (ref 12.0–46.0)
MCHC: 34.9 g/dL (ref 30.0–36.0)
MCV: 96.8 fl (ref 78.0–100.0)
MONOS PCT: 10.3 % (ref 3.0–12.0)
Monocytes Absolute: 0.4 10*3/uL (ref 0.1–1.0)
NEUTROS PCT: 71.9 % (ref 43.0–77.0)
Neutro Abs: 2.7 10*3/uL (ref 1.4–7.7)
Platelets: 251 10*3/uL (ref 150.0–400.0)
RBC: 4.34 Mil/uL (ref 3.87–5.11)
RDW: 13.6 % (ref 11.5–15.5)
WBC: 3.8 10*3/uL — ABNORMAL LOW (ref 4.0–10.5)

## 2016-06-20 MED ORDER — MERCAPTOPURINE 50 MG PO TABS: 75.0000 mg | ORAL_TABLET | Freq: Every day | ORAL | 5 refills | Status: DC

## 2016-06-20 MED ORDER — CEPHALEXIN 250 MG PO CAPS: 250.0000 mg | ORAL_CAPSULE | Freq: Every day | ORAL | 11 refills | Status: DC

## 2016-06-20 NOTE — Assessment & Plan Note (Addendum)
Doing well Stay on 6 MP RTC 37mos I explained that she may think she is healed or doesn't have Crohn's because she has been on medication that has kept her in clinical remission.

## 2016-06-20 NOTE — Assessment & Plan Note (Signed)
Labs today

## 2016-06-20 NOTE — Patient Instructions (Addendum)
   Your physician has requested that you go to the basement for the lab work before leaving today.    We have sent the following medications to your pharmacy for you to pick up at your convenience:  6MP, Keflex     Follow up with Dr Carlean Purl in 6 months.     I appreciate the opportunity to care for you. Silvano Rusk, MD, [facility]

## 2016-06-20 NOTE — Assessment & Plan Note (Signed)
Pessary F/u Uro gynecology

## 2016-06-20 NOTE — Progress Notes (Signed)
   [name] 83 y.o. 1933-03-29 [id number]  Assessment & Plan:   Encounter Diagnoses  Name Primary?  . Crohn's disease of small intestine without complication ([place])   . Bladder prolapse, female, acquired   . Long-term use of immunosuppressant medication    CROHN'S DISEASE, SMALL INTESTINE Doing well Stay on 6 MP RTC 43mo I explained that she may think she is healed or doesn't have Crohn's because she has been on medication that has kept her in clinical remission.  Bladder prolapse, female, acquired Pessary F/u Uro gynecology  Long-term use of immunosuppressant medication Labs today     Subjective:   Chief Complaint: f/u Crohn's - "I am healed"  HPI Here w/ son - she says "I believe I am healed of Crohn's" Per son she stopped taking 6 MP at some point. She says she will take it "to keep the piece" Denies any abdominal pain, diarrhea.  Does not remember that I am aware of past medical hx issues. Got new larger Pessary recently  Medications, allergies, past medical history, past surgical history, family history and social history are reviewed and updated in the EMR.   Review of Systems Walking with a cane  Objective:   Physical Exam BP (!) 146/90 (BP Location: Left Arm, Patient Position: Sitting, Cuff Size: Normal)   Pulse 76   Ht 5' 1"  (1.549 m)   Wt 111 lb 6 oz (50.5 kg)   BMI 21.04 kg/m  NAD Anicteric Lungs cta Cor S1S2  abd soft and NT   15 minutes time spent with patient > half in counseling coordination of care

## 2016-06-21 LAB — HEPATIC FUNCTION PANEL
ALBUMIN: 4 g/dL (ref 3.5–5.2)
ALK PHOS: 82 U/L (ref 39–117)
ALT: 19 U/L (ref 0–35)
AST: 22 U/L (ref 0–37)
Bilirubin, Direct: 0 mg/dL (ref 0.0–0.3)
Total Bilirubin: 0.8 mg/dL (ref 0.2–1.2)
Total Protein: 7.1 g/dL (ref 6.0–8.3)

## 2016-06-23 NOTE — Progress Notes (Signed)
Lab work looks good She needs to repeat in 4 months

## 2016-06-24 ENCOUNTER — Other Ambulatory Visit: Payer: Self-pay

## 2016-06-24 DIAGNOSIS — Z79899 Other long term (current) drug therapy: Secondary | ICD-10-CM

## 2016-07-31 DIAGNOSIS — Z6821 Body mass index (BMI) 21.0-21.9, adult: Secondary | ICD-10-CM | POA: Diagnosis not present

## 2016-07-31 DIAGNOSIS — Z0000 Encounter for general adult medical examination without abnormal findings: Secondary | ICD-10-CM | POA: Diagnosis not present

## 2016-11-13 DIAGNOSIS — L298 Other pruritus: Secondary | ICD-10-CM | POA: Diagnosis not present

## 2016-11-13 DIAGNOSIS — R8290 Unspecified abnormal findings in urine: Secondary | ICD-10-CM | POA: Diagnosis not present

## 2016-11-13 DIAGNOSIS — D225 Melanocytic nevi of trunk: Secondary | ICD-10-CM | POA: Diagnosis not present

## 2016-11-13 DIAGNOSIS — N813 Complete uterovaginal prolapse: Secondary | ICD-10-CM | POA: Diagnosis not present

## 2016-11-13 DIAGNOSIS — N3281 Overactive bladder: Secondary | ICD-10-CM | POA: Diagnosis not present

## 2016-11-13 DIAGNOSIS — L570 Actinic keratosis: Secondary | ICD-10-CM | POA: Diagnosis not present

## 2016-11-26 ENCOUNTER — Telehealth: Payer: Self-pay

## 2016-11-26 NOTE — Telephone Encounter (Signed)
Patient is on the list for [employer] 2018 and may be a good candidate for an AWV. Please let me know if/when appt is scheduled.   

## 2016-11-26 NOTE — Telephone Encounter (Signed)
Scheduled 12/17/16

## 2016-12-10 NOTE — Progress Notes (Deleted)
Subjective:   [name] is a 83 y.o. female who presents for Medicare Annual (Subsequent) preventive examination.  Review of Systems:  No ROS.  Medicare Wellness Visit. Additional risk factors are reflected in the social history.        Objective:     Vitals: There were no vitals taken for this visit.  There is no height or weight on file to calculate BMI.   Tobacco History  Smoking Status  . Never Smoker  Smokeless Tobacco  . Never Used     Counseling given: Not Answered   Past Medical History:  Diagnosis Date  . Bladder prolapse, female, acquired    pessary in  . Complete uterine prolapse   . Crohn's disease ([place])    remission  . Diverticulosis   . E. coli UTI 06/09/2013  . Fall 05/2013  . Hemorrhoids   . History of hypercalcemia   . History of hyperglycemia   . Hydronephrosis   . Hyperparathyroidism   . Multiple pelvic fractures ([place]) 07/02/2013  . Osteoporosis   . Pubic ramus fracture ([place])   . Urinary incontinence 09/09/2011  . Vitamin D deficiency    Past Surgical History:  Procedure Laterality Date  . COLONOSCOPY  12/23/2008   crohn's, diverticulosis, internal hemorrhoids  . HIP FRACTURE SURGERY Left    left, prosthesis  . LAPAROSCOPY  10/2009  . PARATHYROIDECTOMY Left 08/30/2013   Procedure: LEFT INFERIOR PARATHYROIDECTOMY;  Surgeon: Earnstine Regal, MD;  Location: [facility];  Service: General;  Laterality: Left;  . RHINOPLASTY    . TONSILLECTOMY     Family History  Problem Relation Age of Onset  . Heart disease Father   . Colon cancer Father   . Osteoporosis Mother   . Breast cancer Mother   . Prostate cancer Brother    History  Sexual Activity  . Sexual activity: No    Comment: because of prolapse and pessary    Outpatient Encounter Prescriptions as of 12/17/2016  Medication Sig  . calcium-vitamin D (OSCAL WITH D) 500-200 MG-UNIT tablet Take 1 tablet by mouth 2 (two) times daily.  . cephALEXin (KEFLEX) 250 MG capsule Take 1 capsule  (250 mg total) by mouth daily.  Marland Kitchen estrogens, conjugated, (PREMARIN) 0.625 MG tablet Take 0.625 mg by mouth daily. Take daily for 21 days then do not take for 7 days.  Marland Kitchen mercaptopurine (PURINETHOL) 50 MG tablet Take 1.5 tablets (75 mg total) by mouth daily. Give on an empty stomach 1 hour before or 2 hours after meals. Caution: Chemotherapy.  . mirabegron ER (MYRBETRIQ) 50 MG TB24 tablet Take 50 mg by mouth daily.   No facility-administered encounter medications on file as of 12/17/2016.     Activities of Daily Living No flowsheet data found.  Patient Care Team: Tonia Ghent, MD as PCP - General (Family Medicine) Gatha Mayer, MD as Consulting Physician (Gastroenterology) Marti Sleigh, MD as Consulting Physician (Gynecology)    Assessment:    Physical assessment deferred to PCP.  Exercise Activities and Dietary recommendations    Goals    . Increase physical activity          Starting 12/08/2015, I will continue to walk on my driveway at least once daily.       Fall Risk Fall Risk  12/08/2015 02/07/2014  Falls in the past year? No Yes  Number falls in past yr: - 1  Injury with Fall? - Yes  Risk Factor Category  - High Fall  Risk  Risk for fall due to : - Other (Comment)  Risk for fall due to (comments): - uses a cane now, had hip fracture after fall last year and surgery   Depression Screen PHQ 2/9 Scores 12/08/2015  PHQ - 2 Score 0     Cognitive Function MMSE - Mini Mental State Exam 12/08/2015  Orientation to time 5  Orientation to Place 5  Registration 3  Attention/ Calculation 0  Recall 3  Language- name 2 objects 0  Language- repeat 1  Language- follow 3 step command 3  Language- read & follow direction 0  Write a sentence 0  Copy design 0  Total score 20        Immunization History  Administered Date(s) Administered  . Influenza Split 05/27/2011   Screening Tests Health Maintenance  Topic Date Due  . DEXA SCAN  12/07/2025 (Originally  05/25/2000)  . TETANUS/TDAP  12/07/2025 (Originally 05/25/1952)  . PNA vac Low Risk Adult (1 of 2 - PCV13) 12/07/2025 (Originally 05/25/2000)  . INFLUENZA VACCINE  01/08/2017      Plan:   Follow-up w/ PCP as scheduled.   I have personally reviewed and noted the following in the patient's chart:   . Medical and social history . Use of alcohol, tobacco or illicit drugs  . Current medications and supplements . Functional ability and status . Nutritional status . Physical activity . Advanced directives . List of other physicians . Vitals . Screenings to include cognitive, depression, and falls . Referrals and appointments  In addition, I have reviewed and discussed with patient certain preventive protocols, quality metrics, and best practice recommendations. A written personalized care plan for preventive services as well as general preventive health recommendations were provided to patient.     Dorrene German, RN  12/10/2016

## 2016-12-12 NOTE — Progress Notes (Deleted)
PCP notes:   Health maintenance:   Abnormal screenings:    Patient concerns:    Nurse concerns:   Next PCP appt:    

## 2016-12-16 ENCOUNTER — Other Ambulatory Visit: Payer: Self-pay | Admitting: Family Medicine

## 2016-12-16 DIAGNOSIS — M810 Age-related osteoporosis without current pathological fracture: Secondary | ICD-10-CM

## 2016-12-16 DIAGNOSIS — K5000 Crohn's disease of small intestine without complications: Secondary | ICD-10-CM

## 2016-12-17 ENCOUNTER — Ambulatory Visit: Payer: Medicare HMO

## 2016-12-17 ENCOUNTER — Other Ambulatory Visit (INDEPENDENT_AMBULATORY_CARE_PROVIDER_SITE_OTHER): Payer: Medicare HMO

## 2016-12-17 ENCOUNTER — Ambulatory Visit (INDEPENDENT_AMBULATORY_CARE_PROVIDER_SITE_OTHER): Payer: Medicare HMO | Admitting: Family Medicine

## 2016-12-17 ENCOUNTER — Encounter: Payer: Self-pay | Admitting: Family Medicine

## 2016-12-17 VITALS — BP 150/80 | HR 68 | Temp 98.5°F | Wt 113.0 lb

## 2016-12-17 DIAGNOSIS — Z0000 Encounter for general adult medical examination without abnormal findings: Secondary | ICD-10-CM

## 2016-12-17 DIAGNOSIS — M810 Age-related osteoporosis without current pathological fracture: Secondary | ICD-10-CM | POA: Diagnosis not present

## 2016-12-17 DIAGNOSIS — N8110 Cystocele, unspecified: Secondary | ICD-10-CM

## 2016-12-17 DIAGNOSIS — E210 Primary hyperparathyroidism: Secondary | ICD-10-CM

## 2016-12-17 DIAGNOSIS — Z7189 Other specified counseling: Secondary | ICD-10-CM | POA: Diagnosis not present

## 2016-12-17 DIAGNOSIS — K5000 Crohn's disease of small intestine without complications: Secondary | ICD-10-CM

## 2016-12-17 LAB — COMPREHENSIVE METABOLIC PANEL
ALBUMIN: 3.9 g/dL (ref 3.5–5.2)
ALK PHOS: 66 U/L (ref 39–117)
ALT: 12 U/L (ref 0–35)
AST: 17 U/L (ref 0–37)
BILIRUBIN TOTAL: 0.7 mg/dL (ref 0.2–1.2)
BUN: 24 mg/dL — AB (ref 6–23)
CALCIUM: 9.7 mg/dL (ref 8.4–10.5)
CHLORIDE: 106 meq/L (ref 96–112)
CO2: 28 mEq/L (ref 19–32)
CREATININE: 0.96 mg/dL (ref 0.40–1.20)
GFR: 59.2 mL/min — ABNORMAL LOW (ref >60.00)
Glucose, Bld: 110 mg/dL — ABNORMAL HIGH (ref 70–99)
Potassium: 4.2 mEq/L (ref 3.5–5.1)
SODIUM: 140 meq/L (ref 135–145)
TOTAL PROTEIN: 6.8 g/dL (ref 6.0–8.3)

## 2016-12-17 LAB — CBC WITH DIFFERENTIAL/PLATELET
BASOS PCT: 0.9 % (ref 0.0–3.0)
Basophils Absolute: 0 10*3/uL (ref 0.0–0.1)
EOS ABS: 0.2 10*3/uL (ref 0.0–0.7)
EOS PCT: 3.2 % (ref 0.0–5.0)
HEMATOCRIT: 42.7 % (ref 36.0–46.0)
HEMOGLOBIN: 14.8 g/dL (ref 12.0–15.0)
LYMPHS PCT: 18.4 % (ref 12.0–46.0)
Lymphs Abs: 0.9 10*3/uL (ref 0.7–4.0)
MCHC: 34.8 g/dL (ref 30.0–36.0)
MCV: 93.7 fl (ref 78.0–100.0)
Monocytes Absolute: 0.5 10*3/uL (ref 0.1–1.0)
Monocytes Relative: 10.3 % (ref 3.0–12.0)
Neutro Abs: 3.3 10*3/uL (ref 1.4–7.7)
Neutrophils Relative %: 67.2 % (ref 43.0–77.0)
Platelets: 247 10*3/uL (ref 150.0–400.0)
RBC: 4.56 Mil/uL (ref 3.87–5.11)
RDW: 12.4 % (ref 11.5–15.5)
WBC: 5 10*3/uL (ref 4.0–10.5)

## 2016-12-17 LAB — VITAMIN D 25 HYDROXY (VIT D DEFICIENCY, FRACTURES): VITD: 24.18 ng/mL — AB (ref 30.00–100.00)

## 2016-12-17 NOTE — Telephone Encounter (Signed)
Pt was seen by PCP today. She left after getting labs and did not have AWV.   Called pt to either come back in or reschedule and she would like me to call her back tomorrow.

## 2016-12-17 NOTE — Progress Notes (Signed)
Per patient report she is off HRT.  Med list updated.   She still uses a pessary, per urogyn clinic.  I'll defer, d/w pt.  Pessary helps.    H/o hyperparathyroidism.  Due for f/u labs.  D/w pt.  S/p surgery in the past.    H/o possible crohn's dx.  No blood in stool, no abd pain, no sx currently.  Off med currently.  Due for f/u labs.    Mammogram d/w pt.  She can call for appointment.   Declined DXA at this point.  D/w pt.   Vaccination encouraged, she declined at this point.   Advance directive d/w pt.   Would have all three of her kids designated if patient were incapacitated.    Safety discussed with patient in general, routine cautions given.  Recheck BP 150/80.  See AVS.   PMH and SH reviewed  ROS: Per HPI unless specifically indicated in ROS section   Meds, vitals, and allergies reviewed.   GEN: nad, alert and oriented HEENT: mucous membranes moist NECK: supple w/o LA CV: rrr PULM: ctab, no inc wob ABD: soft, +bs EXT: no edema SKIN: no acute rash

## 2016-12-17 NOTE — Patient Instructions (Addendum)
You can call for a mammogram at the [facility] on getting the shingles and pneumonia shots.  Talk to your daughter about the shots.   Recheck your BP a few times at home and update me as needed.  Take care.  Glad to see you.  Go to the lab on the way out.  We'll contact you with your lab report. Update me as needed.

## 2016-12-18 DIAGNOSIS — Z7189 Other specified counseling: Secondary | ICD-10-CM | POA: Insufficient documentation

## 2016-12-18 DIAGNOSIS — Z0000 Encounter for general adult medical examination without abnormal findings: Secondary | ICD-10-CM | POA: Insufficient documentation

## 2016-12-18 NOTE — Assessment & Plan Note (Signed)
Recheck labs pending.  See notes on labs.  Path/phys d/w pt. >25 minutes spent in face to face time with patient, >50% spent in counselling or coordination of care.

## 2016-12-18 NOTE — Telephone Encounter (Signed)
Pt was eating breakfast with a friend and will call back

## 2016-12-18 NOTE — Assessment & Plan Note (Signed)
Treated with pessary, per urogyn.

## 2016-12-18 NOTE — Assessment & Plan Note (Signed)
D/w pt.  Off tx. No sx.  See notes on labs.

## 2016-12-18 NOTE — Assessment & Plan Note (Signed)
Mammogram d/w pt.  She can call for appointment.   Declined DXA at this point.  D/w pt.   Vaccination encouraged, she declined at this point.   Advance directive d/w pt.   Would have all three of her kids designated if patient were incapacitated.

## 2016-12-18 NOTE — Assessment & Plan Note (Signed)
Declined DXA.  D/w pt.

## 2016-12-18 NOTE — Assessment & Plan Note (Signed)
Advance directive d/w pt.   Would have all three of her kids designated if patient were incapacitated.

## 2016-12-19 ENCOUNTER — Other Ambulatory Visit: Payer: Self-pay | Admitting: Family Medicine

## 2016-12-19 DIAGNOSIS — E559 Vitamin D deficiency, unspecified: Secondary | ICD-10-CM

## 2016-12-19 MED ORDER — VITAMIN D3 25 MCG (1000 UNIT) PO TABS: 1000.0000 [IU] | ORAL_TABLET | Freq: Every day | ORAL | Status: DC

## 2017-01-03 NOTE — Telephone Encounter (Signed)
Pt did not want to come back in for AWV. Pt has hard time with transportation.   Scheduled for 12/23/2017

## 2017-03-03 DIAGNOSIS — B029 Zoster without complications: Secondary | ICD-10-CM | POA: Diagnosis not present

## 2017-03-10 ENCOUNTER — Telehealth: Payer: Self-pay | Admitting: Family Medicine

## 2017-03-10 NOTE — Telephone Encounter (Signed)
If the patient needs home health, then we can refer but she'll need an OV re: the triggering issue.  This is usually wound care/PT/etc. This doesn't usually include a home aide.   If she needs an unskilled home aid, then my understanding is that they would need to set that up.  I don't think an order from me would change anything.   See what they need and let me know.

## 2017-03-10 NOTE — Telephone Encounter (Signed)
[name] left note that pt recently diagnosed with shingles on 03/03/17 by Dr Elvera Lennox at [facility]. Pt experiencing a lot of pain and fatigue which makes it impossible for pt to take care of herself.[name] does not go into effect until 04/10/17. pts son wants Dr Damita Dunnings to send a doctors order to link pt with an assisted care agency immediately. [name] is presently trying to assist pt with her needs. [name] request cb. Last seen 12/17/16.

## 2017-03-10 NOTE — Telephone Encounter (Signed)
Elon Alas advised.  Appointment scheduled.

## 2017-03-10 NOTE — Telephone Encounter (Signed)
FYI Kristin @ [facility] called to let us know pt son stated he was coming to see [place].  Erasmo Downer stated pt son is wanting homehealth  ([facility]) for pt/  [place] derm saw pt last week for shingles

## 2017-03-11 ENCOUNTER — Ambulatory Visit (INDEPENDENT_AMBULATORY_CARE_PROVIDER_SITE_OTHER): Payer: Medicare HMO | Admitting: Family Medicine

## 2017-03-11 ENCOUNTER — Telehealth: Payer: Self-pay

## 2017-03-11 ENCOUNTER — Encounter: Payer: Self-pay | Admitting: Family Medicine

## 2017-03-11 DIAGNOSIS — B029 Zoster without complications: Secondary | ICD-10-CM | POA: Diagnosis not present

## 2017-03-11 MED ORDER — LIDOCAINE 5 % EX PTCH: 1.0000 | MEDICATED_PATCH | CUTANEOUS | 0 refills | Status: DC

## 2017-03-11 NOTE — Patient Instructions (Signed)
If/when you have a skilled need, then let me know.  In the meantime, I'll have to defer to you to work with independent agencies.   If a doctor's order will help, then have the agency contact me.  I would try the patches in the meantime for pain.  Take care.  Glad to see you.

## 2017-03-11 NOTE — Telephone Encounter (Signed)
Orlene Erm left v/m who has been to [facility] x 2 to get lidocaine patches and the second time was advised needs ins approval before can be filled. [name] request something done ASAP so can get pt relief from shingles. [name] request cb today.

## 2017-03-11 NOTE — Telephone Encounter (Signed)
[name] called again to get status. She is in pain, usually at night, and has trouble sleeping at night. Also, he is requesting a sleep aid for sleeping when [place].

## 2017-03-11 NOTE — Progress Notes (Signed)
Here today with her son.   I am meeting him for the first time today.    History of medical noncompliance noted. Previously had discussed with patient about routine vaccination. She had declined routine vaccination. She now presents for follow-up of a vaccine preventable disease, shingles.  Shingles on the L side of the back.  Seen by dermatology last week, with rash present a few days prior to derm appointment. On valtrex and taking ibuprofen for pain.  She denied much pain now but son thought she had more pain at night.  D/w pt about options for pain.    She hasn't been sleeping well at night per son's report.    Per son, patient denies or minimizes her symptoms. Per son, family has been checking on patient.  No recent falls.  She uses a walker vs cane at home at baseline.  She lives alone.   Meds, vitals, and allergies reviewed.   ROS: Per HPI unless specifically indicated in ROS section   nad ncat Alert and oriented x3. She is tangential in conversation but this is apparently at baseline and not change from prior conversations with patient. rrr ctab Left-sided dermatomal rash on the trunk consistent with shingles 3/3 attention, 2/3 on recall She understands that she has shingles.

## 2017-03-11 NOTE — Telephone Encounter (Signed)
Prior Authorization submitted per CMM, awaiting response.  [name] is asking for a sleep aid as she is having trouble sleeping at night.

## 2017-03-12 DIAGNOSIS — B029 Zoster without complications: Secondary | ICD-10-CM | POA: Insufficient documentation

## 2017-03-12 DIAGNOSIS — N813 Complete uterovaginal prolapse: Secondary | ICD-10-CM | POA: Diagnosis not present

## 2017-03-12 DIAGNOSIS — N390 Urinary tract infection, site not specified: Secondary | ICD-10-CM | POA: Diagnosis not present

## 2017-03-12 DIAGNOSIS — N3281 Overactive bladder: Secondary | ICD-10-CM | POA: Diagnosis not present

## 2017-03-12 NOTE — Telephone Encounter (Signed)
Left message on voicemail for patient's son to call back.

## 2017-03-12 NOTE — Telephone Encounter (Signed)
Patient's son notified as instructed by telephone and verbalized understanding. Mr. [name] stated that he is not happy with the level of care that his mother is getting. Advised Mr. [name] that patient's patch has been sent to the pharmacy and prior authorization has been started and it can take up to 72 hours to hear back from [employer]. Patient's son stated that he feels that someone here should pick up the phone and call the [place] to get this expedited faster. Advised Mr. [name] that some times if a patient calls the [employer] and complains that can speed the process [place]. Mr. [name] stated that he does not understanding why Dr. Damita Dunnings will not give his mother something to help her sleep and that they are working on getting someone to be with her at night.  Mr. [name] stated that he brought his mother here and does not feel that she is  getting good care. Advised Mr. [name] that as soon as we hear back from [employer] about the prior authorization we will notify the pharmacy immediately so that they will process the prescription.

## 2017-03-12 NOTE — Assessment & Plan Note (Addendum)
Patient denies pain at this time. Has been seen by dermatology. Has been treated with Valtrex, treatment ongoing. Has been taking ibuprofen for pain, as needed. Discussed with patient and son about options. Would use the least invasive forms of treatment possible. Reasonable to use Lidoderm patch. Prescription sent.  Other issues. Patient has a history of noncompliance, see above. She appears able to make her own decisions regarding her care, meaning she is not clearly incompetent and in a position where her healthcare power of attorney/advance directives would take precedent over her decisions.  She has 2 out of 3 recall, her memory does not appear impaired enough to prohibit her from participating in decisions regarding her care.  She has no emergent findings or imminently hazardous threats that would prevent her from living in her own home, and her son reluctantly agreed with that.  Her son apparently wanted skilled care set up for the patient. Staff explained to him multiple times on the phone yesterday that she did not have a need for skilled care and would not qualify for such. He wanted to discuss this anyway. I reiterated this 3 times to her son today. He had been in contact with the [facility] program. He called them to verify what I was telling them. He gave me the phone in the midst of the office visit and I talked with the pace program. I am unaware of any home aide programs for which the patient would qualify where my medical order would facilitate program enrollment.  Representative with the pace program are also unaware of any such program that would require my medical order. If the patient doesn't meet requirements for skilled care, and apparently this patient does not currently have skilled care requirements, then any extra help would need to come through a private outside agency. The family will have to set this up, if the patient does not set up herself. All of this was explained to her son in clear  language.   In the midst of the patient interview, her son was also tangential, similar to his mother. Both required frequent redirecting. In the midst of the interview he asked me for my opinion on her Crohn's disease. I told him I don't have an opinion on her Crohn's disease since I'm not the managing physician. Management needs to come through GI. I will defer to GI.  Her son then asked if the patient could get skilled nursing to come out and put a lidocaine patch on her skin. We discussed that this is not a skilled care need.  The patient's son admitted there was family discord, but did not elaborate.  In short, this patient appears to have the ability to make her own decisions at this point. She has made decisions which were against medical advice, such is not getting routine vaccinations. She now has a vaccine preventable disease, shingles. Apparently there is family discord, the degree to which I am uncertain. There is no eminent threat to her living at home, according to the patient and her son.  She currently has no skilled care need. If she would like to have an outside agency help, then she or her the family will have to set this up independently. There is no expectation of medical insurance coverage with this.  It is reasonable for the family to have a family meeting to try to iron out whatever discord remains, or at least to try to prevent any discord from negatively affecting her help or care.  >  25 minutes spent in face to face time with patient, >50% spent in counselling or coordination of care.

## 2017-03-12 NOTE — Telephone Encounter (Signed)
Received approval letter from [employer] for the Lidocaine patch. Called and advised pharmacist of this and they were able to process the script. Left message on voicemail for Mr. [name] to call the office back.

## 2017-03-12 NOTE — Telephone Encounter (Signed)
This patient is free to choose her MDs.  If her son has concerns then he can express them with the patient.  She got appropriate care yesterday. The rx and the PA were sent on the day of the visit.  I can't make the insurance company act faster.  They can pay cash if needed for the patches and bypass the insurance company.   I did spend extra time yesterday with both of them yesterday, with a significant portion of the visit directed to the issues Lugene had already tried to address with him.

## 2017-03-12 NOTE — Telephone Encounter (Addendum)
Please talk to me prior to returning call.  I would not recommend a sleep aid in an 83 year old lady.  I would try the lidocaine patch.   I sent a note to Dr. Carlean Purl in the meantime.  I'll defer to pt/family about setting up outside help.  It is reasonable for the family to have a family meeting to try to iron out whatever discord remains, or at least to try to prevent any discord from negatively affecting the patients's help or care.  The son didn't elaborate on the type or degree of discord.   Should her medical situation change, then please update me.  Thanks.

## 2017-03-17 ENCOUNTER — Telehealth: Payer: Self-pay

## 2017-03-17 NOTE — Telephone Encounter (Signed)
[name] pts son left v/m; [name] checked on pt today and pt had rough weekend and is still struggling with shingles. What else can be done. [name] request cb to pt.pt last seen 03/11/17. I spoke with pt and she is hurting on shoulder and back. Pt has appt with Dr Elvera Lennox a dermatologist on 03/18/17 at 3:20 pm. Pt is not sure if she is using a lidocaine patch or not and pt said [name] is busy right now so I cannot ask [name] if pt got the lidocaine patch. FYI to Dr Damita Dunnings.

## 2017-03-17 NOTE — Telephone Encounter (Signed)
I will defer to dermatology with the f/u tomorrow.  I still don't see a role for skilled care in her situation, so we will have to defer to family/patient re: family meeting and/or lining up outside agency for extra help.

## 2017-03-18 DIAGNOSIS — B029 Zoster without complications: Secondary | ICD-10-CM | POA: Diagnosis not present

## 2017-03-18 DIAGNOSIS — S20462A Insect bite (nonvenomous) of left back wall of thorax, initial encounter: Secondary | ICD-10-CM | POA: Diagnosis not present

## 2017-03-18 DIAGNOSIS — D692 Other nonthrombocytopenic purpura: Secondary | ICD-10-CM | POA: Diagnosis not present

## 2017-03-25 ENCOUNTER — Other Ambulatory Visit: Payer: Medicare HMO

## 2017-04-02 ENCOUNTER — Ambulatory Visit (INDEPENDENT_AMBULATORY_CARE_PROVIDER_SITE_OTHER): Payer: Medicare HMO | Admitting: Family Medicine

## 2017-04-02 ENCOUNTER — Encounter: Payer: Self-pay | Admitting: Family Medicine

## 2017-04-02 DIAGNOSIS — E559 Vitamin D deficiency, unspecified: Secondary | ICD-10-CM | POA: Diagnosis not present

## 2017-04-02 DIAGNOSIS — R0789 Other chest pain: Secondary | ICD-10-CM | POA: Diagnosis not present

## 2017-04-02 NOTE — Patient Instructions (Signed)
If the pain continues, then let us know.   Go to the lab on the way out.  We'll contact you with your lab report. Take care.  Glad to see you.

## 2017-04-02 NOTE — Progress Notes (Signed)
She saw derm clinic re: shingles and she has "no pain" at this point from shingles, per her report.    She had fallen at home >1 month ago, d/w pt.  Per patient, her son is aware.  She has not discussed with her daughter.  Per patient, it is okay for me to talk to her daughter.  She has walker to use at home.  D/w pt about fall cautions.    Due for f/u vit D check.    She has a "different" pain on the L side of her torso.  Intermittent, variable- sometimes worse than others.  Not present now.  No pain with deep breath.  She wanted her heart checked "to make sure I didn't have a heart condition."  The L sided pain isn't worse with exertion.  She isn't SOB.  No BLE edema.  No vomiting, no diarrhea.  No fevers.  No new rash.    Meds, vitals, and allergies reviewed.   ROS: Per HPI unless specifically indicated in ROS section   Chaperoned exam.  nad ncat Neck supple, no LA L chest wall not ttp.  No bruising.  Old shingles residual rash noted on the L side of the back.  No local change in sensation on palpation or light touch.   rrr ctab abd soft, not ttp Ext w/o edema.

## 2017-04-03 DIAGNOSIS — R0789 Other chest pain: Secondary | ICD-10-CM | POA: Insufficient documentation

## 2017-04-03 LAB — VITAMIN D 25 HYDROXY (VIT D DEFICIENCY, FRACTURES): VITD: 23.36 ng/mL — AB (ref 30.00–100.00)

## 2017-04-03 NOTE — Addendum Note (Signed)
Addended by: Ellamae Sia on: 04/03/2017 09:55 AM   Modules accepted: Orders

## 2017-04-03 NOTE — Assessment & Plan Note (Signed)
Could have been from prior fall or from shingles.  No pain currently.  Wouldn't eval further given the absence of sx at this point.  D/w pt about fall cautions.  Will notify her daughter- patient was okay with me talking to her daughter.

## 2017-04-03 NOTE — Assessment & Plan Note (Signed)
Recheck lab pending.  See notes on lab.

## 2017-04-04 ENCOUNTER — Telehealth: Payer: Self-pay | Admitting: Family Medicine
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
## 2017-04-04 NOTE — Telephone Encounter (Signed)
Noted. Thanks.  At this point, I can't mandate a change in housing and I'll have to defer to the patient and family.  Thanks.

## 2017-04-07 ENCOUNTER — Other Ambulatory Visit: Payer: Self-pay | Admitting: Family Medicine

## 2017-04-07 DIAGNOSIS — R7989 Other specified abnormal findings of blood chemistry: Secondary | ICD-10-CM

## 2017-07-09 ENCOUNTER — Other Ambulatory Visit (INDEPENDENT_AMBULATORY_CARE_PROVIDER_SITE_OTHER): Payer: Medicare HMO

## 2017-07-09 DIAGNOSIS — R7989 Other specified abnormal findings of blood chemistry: Secondary | ICD-10-CM

## 2017-07-09 LAB — VITAMIN D 25 HYDROXY (VIT D DEFICIENCY, FRACTURES): VITD: 26.74 ng/mL — ABNORMAL LOW (ref 30.00–100.00)

## 2017-07-23 DIAGNOSIS — R8290 Unspecified abnormal findings in urine: Secondary | ICD-10-CM | POA: Diagnosis not present

## 2017-07-23 DIAGNOSIS — N813 Complete uterovaginal prolapse: Secondary | ICD-10-CM | POA: Diagnosis not present

## 2017-09-30 DIAGNOSIS — M810 Age-related osteoporosis without current pathological fracture: Secondary | ICD-10-CM | POA: Diagnosis not present

## 2017-09-30 DIAGNOSIS — Z85828 Personal history of other malignant neoplasm of skin: Secondary | ICD-10-CM | POA: Diagnosis not present

## 2017-09-30 DIAGNOSIS — Z803 Family history of malignant neoplasm of breast: Secondary | ICD-10-CM | POA: Diagnosis not present

## 2017-09-30 DIAGNOSIS — R32 Unspecified urinary incontinence: Secondary | ICD-10-CM | POA: Diagnosis not present

## 2017-11-26 DIAGNOSIS — Z4689 Encounter for fitting and adjustment of other specified devices: Secondary | ICD-10-CM | POA: Diagnosis not present

## 2017-11-26 DIAGNOSIS — R8290 Unspecified abnormal findings in urine: Secondary | ICD-10-CM | POA: Diagnosis not present

## 2017-11-26 DIAGNOSIS — N952 Postmenopausal atrophic vaginitis: Secondary | ICD-10-CM | POA: Diagnosis not present

## 2017-11-26 DIAGNOSIS — N813 Complete uterovaginal prolapse: Secondary | ICD-10-CM | POA: Diagnosis not present

## 2017-12-03 ENCOUNTER — Other Ambulatory Visit: Payer: Self-pay

## 2017-12-03 ENCOUNTER — Inpatient Hospital Stay (HOSPITAL_COMMUNITY)
Admission: EM | Admit: 2017-12-03 | Discharge: 2017-12-11 | DRG: 689 | Disposition: A | Discharge status: Skilled Nursing Facility | Payer: Medicare HMO | Attending: Internal Medicine | Admitting: Internal Medicine

## 2017-12-03 ENCOUNTER — Encounter (HOSPITAL_COMMUNITY): Payer: Self-pay | Admitting: *Deleted

## 2017-12-03 DIAGNOSIS — I5033 Acute on chronic diastolic (congestive) heart failure: Secondary | ICD-10-CM | POA: Diagnosis present

## 2017-12-03 DIAGNOSIS — I5031 Acute diastolic (congestive) heart failure: Secondary | ICD-10-CM | POA: Diagnosis not present

## 2017-12-03 DIAGNOSIS — K5000 Crohn's disease of small intestine without complications: Secondary | ICD-10-CM | POA: Diagnosis not present

## 2017-12-03 DIAGNOSIS — I509 Heart failure, unspecified: Secondary | ICD-10-CM | POA: Diagnosis not present

## 2017-12-03 DIAGNOSIS — N3000 Acute cystitis without hematuria: Secondary | ICD-10-CM | POA: Diagnosis not present

## 2017-12-03 DIAGNOSIS — R1110 Vomiting, unspecified: Secondary | ICD-10-CM | POA: Diagnosis not present

## 2017-12-03 DIAGNOSIS — R531 Weakness: Secondary | ICD-10-CM

## 2017-12-03 DIAGNOSIS — Z66 Do not resuscitate: Secondary | ICD-10-CM | POA: Diagnosis not present

## 2017-12-03 DIAGNOSIS — I10 Essential (primary) hypertension: Secondary | ICD-10-CM | POA: Diagnosis not present

## 2017-12-03 DIAGNOSIS — Z9114 Patient's other noncompliance with medication regimen: Secondary | ICD-10-CM

## 2017-12-03 DIAGNOSIS — E875 Hyperkalemia: Secondary | ICD-10-CM | POA: Diagnosis present

## 2017-12-03 DIAGNOSIS — E440 Moderate protein-calorie malnutrition: Secondary | ICD-10-CM | POA: Diagnosis not present

## 2017-12-03 DIAGNOSIS — Z803 Family history of malignant neoplasm of breast: Secondary | ICD-10-CM

## 2017-12-03 DIAGNOSIS — N390 Urinary tract infection, site not specified: Secondary | ICD-10-CM | POA: Diagnosis not present

## 2017-12-03 DIAGNOSIS — R112 Nausea with vomiting, unspecified: Secondary | ICD-10-CM | POA: Diagnosis present

## 2017-12-03 DIAGNOSIS — K50019 Crohn's disease of small intestine with unspecified complications: Secondary | ICD-10-CM | POA: Diagnosis not present

## 2017-12-03 DIAGNOSIS — R197 Diarrhea, unspecified: Secondary | ICD-10-CM | POA: Diagnosis not present

## 2017-12-03 DIAGNOSIS — I5032 Chronic diastolic (congestive) heart failure: Secondary | ICD-10-CM | POA: Diagnosis present

## 2017-12-03 DIAGNOSIS — M810 Age-related osteoporosis without current pathological fracture: Secondary | ICD-10-CM | POA: Diagnosis present

## 2017-12-03 DIAGNOSIS — Z8249 Family history of ischemic heart disease and other diseases of the circulatory system: Secondary | ICD-10-CM

## 2017-12-03 DIAGNOSIS — R030 Elevated blood-pressure reading, without diagnosis of hypertension: Secondary | ICD-10-CM | POA: Diagnosis present

## 2017-12-03 DIAGNOSIS — Z96642 Presence of left artificial hip joint: Secondary | ICD-10-CM | POA: Diagnosis present

## 2017-12-03 DIAGNOSIS — Z800 Family history of malignant neoplasm of digestive organs: Secondary | ICD-10-CM

## 2017-12-03 DIAGNOSIS — Z8262 Family history of osteoporosis: Secondary | ICD-10-CM

## 2017-12-03 DIAGNOSIS — K50018 Crohn's disease of small intestine with other complication: Secondary | ICD-10-CM | POA: Diagnosis not present

## 2017-12-03 DIAGNOSIS — I110 Hypertensive heart disease with heart failure: Secondary | ICD-10-CM | POA: Diagnosis present

## 2017-12-03 DIAGNOSIS — B9620 Unspecified Escherichia coli [E. coli] as the cause of diseases classified elsewhere: Secondary | ICD-10-CM | POA: Diagnosis present

## 2017-12-03 DIAGNOSIS — I160 Hypertensive urgency: Secondary | ICD-10-CM | POA: Diagnosis present

## 2017-12-03 DIAGNOSIS — K449 Diaphragmatic hernia without obstruction or gangrene: Secondary | ICD-10-CM | POA: Diagnosis present

## 2017-12-03 DIAGNOSIS — Z8042 Family history of malignant neoplasm of prostate: Secondary | ICD-10-CM

## 2017-12-03 DIAGNOSIS — K5730 Diverticulosis of large intestine without perforation or abscess without bleeding: Secondary | ICD-10-CM | POA: Diagnosis present

## 2017-12-03 LAB — COMPREHENSIVE METABOLIC PANEL
ALBUMIN: 3.9 g/dL (ref 3.5–5.0)
ALK PHOS: 69 U/L (ref 38–126)
ALT: 20 U/L (ref 0–44)
AST: 25 U/L (ref 15–41)
Anion gap: 10 (ref 5–15)
BILIRUBIN TOTAL: 1.2 mg/dL (ref 0.3–1.2)
BUN: 24 mg/dL — AB (ref 8–23)
CALCIUM: 9.8 mg/dL (ref 8.9–10.3)
CO2: 28 mmol/L (ref 22–32)
Chloride: 105 mmol/L (ref 98–111)
Creatinine, Ser: 0.89 mg/dL (ref 0.44–1.00)
GFR calc Af Amer: >60 mL/min (ref >60)
GFR calc non Af Amer: 59 mL/min — ABNORMAL LOW (ref >60)
GLUCOSE: 131 mg/dL — AB (ref 70–99)
Potassium: 3.8 mmol/L (ref 3.5–5.1)
SODIUM: 143 mmol/L (ref 135–145)
TOTAL PROTEIN: 7.5 g/dL (ref 6.5–8.1)

## 2017-12-03 LAB — CBC
HEMATOCRIT: 50.9 % — AB (ref 36.0–46.0)
Hemoglobin: 16.9 g/dL — ABNORMAL HIGH (ref 12.0–15.0)
MCH: 31 pg (ref 26.0–34.0)
MCHC: 33.2 g/dL (ref 30.0–36.0)
MCV: 93.2 fL (ref 78.0–100.0)
Platelets: 281 10*3/uL (ref 150–400)
RBC: 5.46 MIL/uL — ABNORMAL HIGH (ref 3.87–5.11)
RDW: 12.3 % (ref 11.5–15.5)
WBC: 8.6 10*3/uL (ref 4.0–10.5)

## 2017-12-03 LAB — LIPASE, BLOOD: Lipase: 43 U/L (ref 11–51)

## 2017-12-03 LAB — URINALYSIS, ROUTINE W REFLEX MICROSCOPIC
Bilirubin Urine: NEGATIVE
Glucose, UA: NEGATIVE mg/dL
Hgb urine dipstick: NEGATIVE
Ketones, ur: NEGATIVE mg/dL
NITRITE: NEGATIVE
PH: 8 (ref 5.0–8.0)
Protein, ur: 100 mg/dL — AB
SPECIFIC GRAVITY, URINE: 1.013 (ref 1.005–1.030)
WBC, UA: >50 WBC/hpf — ABNORMAL HIGH (ref 0–5)

## 2017-12-03 LAB — TROPONIN I: Troponin I: <0.03 ng/mL (ref <0.03)

## 2017-12-03 MED ORDER — MORPHINE SULFATE (PF) 4 MG/ML IV SOLN
4.0000 mg | Freq: Once | INTRAVENOUS | Status: AC
  Administered 2017-12-03: 4 mg via INTRAVENOUS
  Filled 2017-12-03: qty 1

## 2017-12-03 MED ORDER — ONDANSETRON 4 MG PO TBDP
8.0000 mg | ORAL_TABLET | Freq: Once | ORAL | Status: AC
  Administered 2017-12-03: 8 mg via ORAL
  Filled 2017-12-03: qty 2

## 2017-12-03 MED ORDER — ONDANSETRON HCL 4 MG/2ML IJ SOLN
4.0000 mg | Freq: Once | INTRAMUSCULAR | Status: AC
  Administered 2017-12-03: 4 mg via INTRAVENOUS
  Filled 2017-12-03: qty 2

## 2017-12-03 MED ORDER — IOPAMIDOL (ISOVUE-300) INJECTION 61%
INTRAVENOUS | Status: AC
  Administered 2017-12-04: 09:00:00
  Filled 2017-12-03: qty 30

## 2017-12-03 NOTE — ED Triage Notes (Signed)
Pt reports generalized weakness, NV, denies diarrhea. Pt reports difficulty ambulating for the past couple of weeks, uses a walker, lives alone.

## 2017-12-03 NOTE — ED Provider Notes (Signed)
Patient placed in Quick Look pathway, seen and evaluated   Chief Complaint: weakness, nausea and vomiting   HPI:   Started today, denies abd pain , cp., sob  Or fever. No urinary sxs. NBNB vomitous, TNTC episodes. No hx of abd surgeries.   ROS: N/V   (one)  Physical Exam:   Gen: No distress  Neuro: Awake and Alert  Skin: Warm    Focused Exam: no abd tenderness   Initiation of care has begun. The patient has been counseled on the process, plan, and necessity for staying for the completion/evaluation, and the remainder of the medical screening examination    Ned Grace 12/03/17 2026    Mesner, Corene Cornea, MD 12/04/17 [phone]

## 2017-12-03 NOTE — ED Provider Notes (Signed)
[facility] EMER[facility]ENCY DEPARTMENT Provider Note   CSN: [id number] Arrival date & time: 12/03/17  1929     History   Chief Complaint Chief Complaint  Patient presents with  . Weakness  . Emesis    HPI [name] is a 84 y.o. female past medical history of bladder prolapse with pessary in place, Crohn's, diverticulosis, osteoporosis who presents for evaluation of generalized weakness, nausea/vomiting that began today.  Patient reports that she had episodes of vomiting after drinking milk.  She does state that the milk expired on 11/27/2017 but she states it did not smell bad.  Patient reports that emesis was nonbloody, nonbilious.  Patient reports that she has not been able to tolerate any p.o. today and has had too numerous to count episodes of vomiting since onset of symptoms.  Patient states she is not having any abdominal pain.  Patient's daughter reports that over the last month, she has noticed some deterioration and states that patient has been generalized weak and fatigue.  Daughter is concerned because they recently found black mold in patient's house and they are concerned of her being sleeping in that room that has it.  Patient and daughter denies any fever, chest pain, difficulty breathing, abdominal pain, nausea/vomiting.  Patient does have a pessary in place because she has a bladder prolapse and she is prone to chronic urinary tract infections.  Patient does report a history of Crohn's but states that she did not like the medication so she stopped it.  She had recently been seen by Dr. Osborne Oman (GI).   The history is provided by the patient.    Past Medical History:  Diagnosis Date  . Bladder prolapse, female, acquired    pessary in  . Complete uterine prolapse   . Crohn's disease ([place]) 2010  . Diverticulosis   . E. coli UTI 06/09/2013  . Fall 05/2013  . Hemorrhoids   . History of hypercalcemia   . History of hyperglycemia   . Hydronephrosis   .  Hyperparathyroidism   . Multiple pelvic fractures 07/02/2013  . Osteoporosis   . Pubic ramus fracture ([place])   . Urinary incontinence 09/09/2011  . Vitamin D deficiency     Patient Active Problem List   Diagnosis Date Noted  . Weakness 12/04/2017  . Non-intractable vomiting with nausea 12/04/2017  . Acute cystitis without hematuria 12/04/2017  . Elevated blood pressure reading 12/04/2017  . Nausea & vomiting 12/04/2017  . Chronic diastolic CHF (congestive heart failure) ([place]) 12/04/2017  . Malnutrition of moderate degree 12/04/2017  . Chest wall pain 04/03/2017  . Shingles 03/12/2017  . Healthcare maintenance 12/18/2016  . Advance care planning 12/18/2016  . Multiple pelvic fractures 07/02/2013  . Urinary incontinence 09/09/2011  . Osteoporosis 10/17/2009  . Primary hyperparathyroidism ([place]) 10/02/2009  . Vitamin D deficiency 01/06/2009  . CROHN'S DISEASE, SMALL INTESTINE 01/04/2009  . Bladder prolapse, female, acquired 01/04/2009    Past Surgical History:  Procedure Laterality Date  . COLONOSCOPY  12/23/2008   crohn's, diverticulosis, internal hemorrhoids  . HIP FRACTURE SURGERY Left    left, prosthesis  . LAPAROSCOPY  10/2009  . PARATHYROIDECTOMY Left 08/30/2013   Procedure: LEFT INFERIOR PARATHYROIDECTOMY;  Surgeon: Earnstine Regal, MD;  Location: [facility] ORS;  Service: General;  Laterality: Left;  . RHINOPLASTY    . TONSILLECTOMY       OB History    Gravida  3   Para  3   Term  3   Preterm  AB      Living  3     SAB      TAB      Ectopic      Multiple      Live Births               Home Medications    Prior to Admission medications   Medication Sig Start Date End Date Taking? Authorizing Provider  calcium-vitamin D (OSCAL WITH D) 500-200 MG-UNIT tablet Take 1 tablet by mouth 2 (two) times daily.   Yes [provider]  cholecalciferol (VITAMIN D) 1000 units tablet Take 1 tablet (1,000 Units total) by mouth daily. 12/19/16  Yes Tonia Ghent, MD  Multiple Vitamin (MULTIVITAMIN) tablet Take 1 tablet by mouth daily.   Yes [provider]    Family History Family History  Problem Relation Age of Onset  . Heart disease Father   . Colon cancer Father   . Osteoporosis Mother   . Breast cancer Mother   . Prostate cancer Brother     Social History Social History   Tobacco Use  . Smoking status: Never Smoker  . Smokeless tobacco: Never Used  Substance Use Topics  . Alcohol use: Yes    Alcohol/week: 0.0 oz    Comment: occ wine  . Drug use: No     Allergies   Patient has no known allergies.   Review of Systems Review of Systems  Constitutional: Negative for chills and fever.  HENT: Negative for congestion.   Eyes: Negative for visual disturbance.  Respiratory: Negative for cough and shortness of breath.   Cardiovascular: Negative for chest pain.  Gastrointestinal: Positive for nausea and vomiting. Negative for abdominal pain and diarrhea.  Genitourinary: Negative for dysuria and hematuria.  Musculoskeletal: Negative for back pain and neck pain.  Skin: Negative for rash.  Neurological: Positive for weakness (generalized). Negative for dizziness, numbness and headaches.  Psychiatric/Behavioral: Negative for confusion.     Physical Exam Updated Vital Signs BP (!) 150/71 (BP Location: Left Arm)   Pulse 83   Temp 98.1 F (36.7 C) (Oral)   Resp 15   Ht 5' 1.5" (1.562 m)   Wt 49.9 kg (110 lb 0.2 oz) Comment: from Oct 2018 records  SpO2 96%   BMI 20.45 kg/m   Physical Exam  Constitutional: She is oriented to person, place, and time. She appears well-developed and well-nourished.  Frail, elderly appearing  HENT:  Head: Normocephalic and atraumatic.  Mouth/Throat: Oropharynx is clear and moist and mucous membranes are normal.  Eyes: Pupils are equal, round, and reactive to light. Conjunctivae, EOM and lids are normal.  Neck: Full passive range of motion without pain.  Cardiovascular:  Normal rate, regular rhythm, normal heart sounds and normal pulses. Exam reveals no gallop and no friction rub.  No murmur heard. Pulses:      Radial pulses are 2+ on the right side, and 2+ on the left side.       Dorsalis pedis pulses are 2+ on the right side, and 2+ on the left side.  Pulmonary/Chest: Effort normal and breath sounds normal.  Lungs clear to auscultation bilaterally.  Symmetric chest rise.  No wheezing, rales, rhonchi.  Abdominal: Soft. Normal appearance and bowel sounds are normal. There is generalized tenderness and tenderness in the right lower quadrant and suprapubic area. There is no rigidity and no guarding.  Generalized tenderness to palpation noted with some worsening in the lower abdomen.  No focal point.  No CVA tenderness bilaterally.  Musculoskeletal: Normal range of motion.  Neurological: She is alert and oriented to person, place, and time.  Skin: Skin is warm and dry. Capillary refill takes less than 2 seconds.  Psychiatric: She has a normal mood and affect. Her speech is normal.  Nursing note and vitals reviewed.    ED Treatments / Results  Labs (all labs ordered are listed, but only abnormal results are displayed) Labs Reviewed  CBC - Abnormal; Notable for the following components:      Result Value   RBC 5.46 (*)    Hemoglobin 16.9 (*)    HCT 50.9 (*)    All other components within normal limits  URINALYSIS, ROUTINE W REFLEX MICROSCOPIC - Abnormal; Notable for the following components:   APPearance CLOUDY (*)    Protein, ur 100 (*)    Leukocytes, UA LARGE (*)    WBC, UA >50 (*)    Bacteria, UA MANY (*)    All other components within normal limits  COMPREHENSIVE METABOLIC PANEL - Abnormal; Notable for the following components:   Glucose, Bld 131 (*)    BUN 24 (*)    GFR calc non Af Amer 59 (*)    All other components within normal limits  COMPREHENSIVE METABOLIC PANEL - Abnormal; Notable for the following components:   Glucose, Bld 140 (*)     GFR calc non Af Amer 53 (*)    All other components within normal limits  CBC WITH DIFFERENTIAL/PLATELET - Abnormal; Notable for the following components:   Hemoglobin 16.0 (*)    HCT 47.0 (*)    Lymphs Abs 0.6 (*)    All other components within normal limits  BRAIN NATRIURETIC PEPTIDE - Abnormal; Notable for the following components:   B Natriuretic Peptide 247.8 (*)    All other components within normal limits  CBG MONITORING, ED - Abnormal; Notable for the following components:   Glucose-Capillary 115 (*)    All other components within normal limits  URINE CULTURE  LIPASE, BLOOD  TROPONIN I  CBC    EKG None  Radiology Ct Abdomen Pelvis W Contrast  Result Date: 12/04/2017 CLINICAL DATA:  84 y/o F; generalized weakness, nausea, vomiting, diarrhea. History of Crohn's. EXAM: CT ABDOMEN AND PELVIS WITH CONTRAST TECHNIQUE: Multidetector CT imaging of the abdomen and pelvis was performed using the standard protocol following bolus administration of intravenous contrast. CONTRAST:  123mL OMNIPAQUE IOHEXOL 300 MG/ML  SOLN COMPARISON:  11/02/2009 CT abdomen and pelvis. FINDINGS: Lower chest: Bilateral breast prostheses. Bilateral lower lobe platelike atelectasis. Small hiatal hernia. Hepatobiliary: No focal liver abnormality is seen. No gallstones, gallbladder wall thickening, or biliary dilatation. Pancreas: Unremarkable. No pancreatic ductal dilatation or surrounding inflammatory changes. Spleen: Normal in size without focal abnormality. Adrenals/Urinary Tract: Adrenal glands are unremarkable. Kidneys are normal, without renal calculi, focal lesion, or hydronephrosis. Bladder is unremarkable. Stomach/Bowel: There is a short segment of wall thickening enhancement of distal small bowel in the lower abdomen compatible with acute inflammation (series 3, image 55). There is a downstream segment of stricture without thickening compatible with chronic sequelae of Crohn's. Additionally, there is diffuse  fatty wall thickening without stricture of small bowel in the right hemiabdomen compatible with chronic inflammatory change. No obstructive or inflammatory changes of the colon. Sigmoid diverticulosis without findings of acute diverticulitis. Vascular/Lymphatic: Aortic atherosclerosis. No enlarged abdominal or pelvic lymph nodes. Reproductive: Uterus and bilateral adnexa are unremarkable. Other: No abdominal wall hernia or abnormality. No abdominopelvic ascites. Musculoskeletal: Moderate  levocurvature of the spine with apex at L2. No acute osseous abnormality identified. IMPRESSION: 1. Acute inflammation of short segment of small bowel in the lower abdomen. Downstream segment of stricture without inflammation compatible with fibrostenotic chronic Crohn's. No fistula identified. 2. Diffuse fatty wall thickening of small bowel in the right hemiabdomen indicating chronic inflammatory change. 3. Sigmoid diverticulosis without findings of acute diverticulitis. 4. Small hiatal hernia. Electronically Signed   By: Kristine Garbe M.D.   On: 12/04/2017 02:18    Procedures Procedures (including critical care time)  Medications Ordered in ED Medications  hydrALAZINE (APRESOLINE) injection 5 mg (5 mg Intravenous Given 12/04/17 0441)  acetaminophen (TYLENOL) tablet 650 mg (has no administration in time range)    Or  acetaminophen (TYLENOL) suppository 650 mg (has no administration in time range)  ondansetron (ZOFRAN) tablet 4 mg ( Oral See Alternative 12/04/17 1450)    Or  ondansetron (ZOFRAN) injection 4 mg (4 mg Intravenous Given 12/04/17 1450)  enoxaparin (LOVENOX) injection 40 mg (has no administration in time range)  cefTRIAXone (ROCEPHIN) 1 g in sodium chloride 0.9 % 100 mL IVPB (has no administration in time range)  feeding supplement (ENSURE ENLIVE) (ENSURE ENLIVE) liquid 237 mL (has no administration in time range)  furosemide (LASIX) injection 20 mg (has no administration in time range)    ondansetron (ZOFRAN-ODT) disintegrating tablet 8 mg (8 mg Oral Given 12/03/17 2030)  morphine 4 MG/ML injection 4 mg (4 mg Intravenous Given 12/03/17 2227)  iopamidol (ISOVUE-300) 61 % injection (  Contrast Given 12/04/17 0854)  ondansetron (ZOFRAN) injection 4 mg (4 mg Intravenous Given 12/03/17 2318)  iohexol (OMNIPAQUE) 300 MG/ML solution 100 mL (100 mLs Intravenous Contrast Given 12/04/17 0128)  cefTRIAXone (ROCEPHIN) 2 g in sodium chloride 0.9 % 100 mL IVPB (0 g Intravenous Stopped 12/04/17 0418)  0.9 %  sodium chloride infusion ( Intravenous Canceled Entry 12/04/17 1157)  hydrALAZINE (APRESOLINE) injection 10 mg (10 mg Intravenous Given 12/04/17 3428)     Initial Impression / Assessment and Plan / ED Course  I have reviewed the triage vital signs and the nursing notes.  Pertinent labs & imaging results that were available during my care of the patient were reviewed by me and considered in my medical decision making (see chart for details).     84 year old female past medical history of bladder prolapse with pessary noted, osteoporosis who presents for evaluation of nausea/vomiting that began today.  Multiple episodes of nonbloody, nonbilious emesis.  Daughter also reports that patient has been generalized weak over the last few months.  No focal weakness.  Patient is not complaining of any pain at this time. Patient is afebril, non-toxic appearing, sitting comfortably on examination table.  Vital signs reviewed.  Patient is hypertensive here in the ED.  Vital signs are otherwise stable.  Patient has no history of high blood pressure is not currently on medications.  Suspect this is likely secondary to pain.  Will give analgesics and then reassess blood pressure.  On exam, lungs clear to auscultation without any abnormalities.  On abdominal exam, patient is tender, particularly in the right lower quadrant.  No distention noted.  Consider infectious etiology versus appendicitis.  Low suspicion for  bowel obstruction but a consideration given nausea/vomiting.  Plan to check basic labs.  Given tenderness, will plan for CT abdomen pelvis.  Troponin negative.  CMP shows BUN of 24.  Review of records that is consistent with previous.  UA is significant for UTI as she has large  leukocytes, pyuria.  CBC is without any significant leukocytosis.  Hemoglobin is 16.9.  No abdominal tenderness, will plan for CT abdomen pelvis evaluation.  Discussed with Dr. Jeneen Rinks after independent evaluation of patient.  Given concerning UA, will plan to treat for UTI.  Patient signed out to Quincy Carnes with CT abd/pelvis pending. CT abd/Pelvis is unremarkable and patient able to tolerate p.o. at discharge.  Otherwise consider admission for further care.  Please see oncoming provider for further ED course.    Final Clinical Impressions(s) / ED Diagnoses   Final diagnoses:  Acute cystitis without hematuria  Non-intractable vomiting with nausea, unspecified vomiting type    ED Discharge Orders    None       Desma Mcgregor 12/04/17 1651    Tanna Furry, MD 12/05/17 [phone]

## 2017-12-04 ENCOUNTER — Encounter (HOSPITAL_COMMUNITY): Payer: Self-pay | Admitting: Internal Medicine

## 2017-12-04 ENCOUNTER — Observation Stay (HOSPITAL_COMMUNITY): Payer: Medicare HMO

## 2017-12-04 ENCOUNTER — Other Ambulatory Visit: Payer: Self-pay

## 2017-12-04 ENCOUNTER — Emergency Department (HOSPITAL_COMMUNITY): Payer: Medicare HMO

## 2017-12-04 DIAGNOSIS — R112 Nausea with vomiting, unspecified: Secondary | ICD-10-CM | POA: Diagnosis not present

## 2017-12-04 DIAGNOSIS — K5000 Crohn's disease of small intestine without complications: Secondary | ICD-10-CM | POA: Diagnosis not present

## 2017-12-04 DIAGNOSIS — R197 Diarrhea, unspecified: Secondary | ICD-10-CM | POA: Diagnosis not present

## 2017-12-04 DIAGNOSIS — R531 Weakness: Secondary | ICD-10-CM

## 2017-12-04 DIAGNOSIS — R1110 Vomiting, unspecified: Secondary | ICD-10-CM | POA: Diagnosis not present

## 2017-12-04 DIAGNOSIS — N3000 Acute cystitis without hematuria: Secondary | ICD-10-CM | POA: Diagnosis not present

## 2017-12-04 DIAGNOSIS — R030 Elevated blood-pressure reading, without diagnosis of hypertension: Secondary | ICD-10-CM

## 2017-12-04 DIAGNOSIS — I509 Heart failure, unspecified: Secondary | ICD-10-CM | POA: Diagnosis not present

## 2017-12-04 DIAGNOSIS — E440 Moderate protein-calorie malnutrition: Secondary | ICD-10-CM

## 2017-12-04 DIAGNOSIS — I5032 Chronic diastolic (congestive) heart failure: Secondary | ICD-10-CM | POA: Diagnosis present

## 2017-12-04 DIAGNOSIS — I10 Essential (primary) hypertension: Secondary | ICD-10-CM

## 2017-12-04 LAB — CBC WITH DIFFERENTIAL/PLATELET
ABS IMMATURE GRANULOCYTES: 0.1 10*3/uL (ref 0.0–0.1)
BASOS ABS: 0 10*3/uL (ref 0.0–0.1)
Basophils Relative: 0 %
Eosinophils Absolute: 0.1 10*3/uL (ref 0.0–0.7)
Eosinophils Relative: 1 %
HEMATOCRIT: 47 % — AB (ref 36.0–46.0)
HEMOGLOBIN: 16 g/dL — AB (ref 12.0–15.0)
Immature Granulocytes: 1 %
LYMPHS ABS: 0.6 10*3/uL — AB (ref 0.7–4.0)
LYMPHS PCT: 7 %
MCH: 31.6 pg (ref 26.0–34.0)
MCHC: 34 g/dL (ref 30.0–36.0)
MCV: 92.7 fL (ref 78.0–100.0)
Monocytes Absolute: 0.6 10*3/uL (ref 0.1–1.0)
Monocytes Relative: 7 %
NEUTROS ABS: 7.2 10*3/uL (ref 1.7–7.7)
Neutrophils Relative %: 84 %
Platelets: 258 10*3/uL (ref 150–400)
RBC: 5.07 MIL/uL (ref 3.87–5.11)
RDW: 12.2 % (ref 11.5–15.5)
WBC: 8.6 10*3/uL (ref 4.0–10.5)

## 2017-12-04 LAB — COMPREHENSIVE METABOLIC PANEL
ALT: 18 U/L (ref 0–44)
ANION GAP: 9 (ref 5–15)
AST: 22 U/L (ref 15–41)
Albumin: 3.6 g/dL (ref 3.5–5.0)
Alkaline Phosphatase: 90 U/L (ref 38–126)
BUN: 19 mg/dL (ref 8–23)
CHLORIDE: 103 mmol/L (ref 98–111)
CO2: 30 mmol/L (ref 22–32)
Calcium: 9.2 mg/dL (ref 8.9–10.3)
Creatinine, Ser: 0.97 mg/dL (ref 0.44–1.00)
GFR calc Af Amer: >60 mL/min (ref >60)
GFR calc non Af Amer: 53 mL/min — ABNORMAL LOW (ref >60)
GLUCOSE: 140 mg/dL — AB (ref 70–99)
POTASSIUM: 3.8 mmol/L (ref 3.5–5.1)
SODIUM: 142 mmol/L (ref 135–145)
TOTAL PROTEIN: 6.7 g/dL (ref 6.5–8.1)
Total Bilirubin: 0.8 mg/dL (ref 0.3–1.2)

## 2017-12-04 LAB — ECHOCARDIOGRAM COMPLETE
Height: 61.5 in
WEIGHTICAEL: 1760.15 [oz_av]

## 2017-12-04 LAB — CBG MONITORING, ED: Glucose-Capillary: 115 mg/dL — ABNORMAL HIGH (ref 70–99)

## 2017-12-04 LAB — BRAIN NATRIURETIC PEPTIDE: B NATRIURETIC PEPTIDE 5: 247.8 pg/mL — AB (ref 0.0–100.0)

## 2017-12-04 MED ORDER — IOHEXOL 300 MG/ML  SOLN
100.0000 mL | Freq: Once | INTRAMUSCULAR | Status: AC | PRN
  Administered 2017-12-04: 100 mL via INTRAVENOUS

## 2017-12-04 MED ORDER — SODIUM CHLORIDE 0.9 % IV SOLN
Freq: Once | INTRAVENOUS | Status: AC
  Administered 2017-12-04: 04:00:00 via INTRAVENOUS

## 2017-12-04 MED ORDER — ACETAMINOPHEN 325 MG PO TABS: 650.0000 mg | ORAL_TABLET | Freq: Four times a day (QID) | ORAL | Status: DC | PRN

## 2017-12-04 MED ORDER — ENSURE ENLIVE PO LIQD
237.0000 mL | Freq: Three times a day (TID) | ORAL | Status: DC
  Administered 2017-12-04 – 2017-12-11 (×19): 237 mL via ORAL

## 2017-12-04 MED ORDER — SODIUM CHLORIDE 0.9 % IV SOLN
1.0000 g | INTRAVENOUS | Status: DC
  Administered 2017-12-05 – 2017-12-06 (×2): 1 g via INTRAVENOUS
  Filled 2017-12-04 (×2): qty 10

## 2017-12-04 MED ORDER — HYDRALAZINE HCL 20 MG/ML IJ SOLN
10.0000 mg | Freq: Once | INTRAMUSCULAR | Status: AC
  Administered 2017-12-04: 10 mg via INTRAVENOUS
  Filled 2017-12-04: qty 1

## 2017-12-04 MED ORDER — ONDANSETRON HCL 4 MG/2ML IJ SOLN
4.0000 mg | Freq: Four times a day (QID) | INTRAMUSCULAR | Status: DC | PRN
  Administered 2017-12-04 (×2): 4 mg via INTRAVENOUS
  Filled 2017-12-04 (×2): qty 2

## 2017-12-04 MED ORDER — ACETAMINOPHEN 650 MG RE SUPP: 650.0000 mg | Freq: Four times a day (QID) | RECTAL | Status: DC | PRN

## 2017-12-04 MED ORDER — ENSURE ENLIVE PO LIQD
237.0000 mL | Freq: Two times a day (BID) | ORAL | Status: DC
  Administered 2017-12-04: 237 mL via ORAL

## 2017-12-04 MED ORDER — FUROSEMIDE 10 MG/ML IJ SOLN
20.0000 mg | Freq: Two times a day (BID) | INTRAMUSCULAR | Status: DC
  Administered 2017-12-04 – 2017-12-07 (×6): 20 mg via INTRAVENOUS
  Filled 2017-12-04 (×6): qty 2

## 2017-12-04 MED ORDER — CEFTRIAXONE SODIUM 2 G IJ SOLR
2.0000 g | Freq: Once | INTRAMUSCULAR | Status: AC
  Administered 2017-12-04: 2 g via INTRAVENOUS
  Filled 2017-12-04: qty 20

## 2017-12-04 MED ORDER — ONDANSETRON HCL 4 MG PO TABS: 4.0000 mg | ORAL_TABLET | Freq: Four times a day (QID) | ORAL | Status: DC | PRN

## 2017-12-04 MED ORDER — METHYLPREDNISOLONE SODIUM SUCC 40 MG IJ SOLR
30.0000 mg | Freq: Two times a day (BID) | INTRAMUSCULAR | Status: DC
  Administered 2017-12-04 – 2017-12-05 (×2): 30 mg via INTRAVENOUS
  Filled 2017-12-04 (×2): qty 1

## 2017-12-04 MED ORDER — HYDRALAZINE HCL 20 MG/ML IJ SOLN
5.0000 mg | INTRAMUSCULAR | Status: DC | PRN
Start: 2017-12-04 — End: 2017-12-11
  Administered 2017-12-04 – 2017-12-06 (×2): 5 mg via INTRAVENOUS
  Filled 2017-12-04 (×2): qty 1

## 2017-12-04 MED ORDER — ENOXAPARIN SODIUM 40 MG/0.4ML ~~LOC~~ SOLN
40.0000 mg | SUBCUTANEOUS | Status: DC
  Administered 2017-12-04 – 2017-12-10 (×7): 40 mg via SUBCUTANEOUS
  Filled 2017-12-04 (×7): qty 0.4

## 2017-12-04 MED ORDER — SODIUM CHLORIDE 0.9 % IV SOLN
INTRAVENOUS | Status: DC
  Administered 2017-12-04 (×2): via INTRAVENOUS

## 2017-12-04 NOTE — Progress Notes (Signed)
  Echocardiogram 2D Echocardiogram has been performed.  Merrie Roof F 12/04/2017, 11:13 AM

## 2017-12-04 NOTE — Clinical Social Work Note (Signed)
Clinical Social Work Assessment  Patient Details  Name: [name] MRN: [id number] Date of Birth: May 17, 1933  Date of referral:  12/04/17               Reason for consult:  Facility Placement                Permission sought to share information with:  Facility Sport and exercise psychologist, Family Supports Permission granted to share information::  No  Name::     Chartered certified accountant::  SNFs  Relationship::  Daughter  Contact Information:  [phone]  Housing/Transportation Living arrangements for the past 2 months:  [place] of Information:  Adult Children Patient Interpreter Needed:  None Criminal Activity/Legal Involvement Pertinent to Current Situation/Hospitalization:  No - Comment as needed Significant Relationships:  Adult Children Lives with:  Self Do you feel safe going back to the place where you live?  No Need for family participation in patient care:  Yes (Comment)  Care giving concerns:  CSW received consult for possible SNF placement at time of discharge. CSW spoke with patient's daughter, [name], regarding PT recommendation of SNF placement at time of discharge. Patient's daughter reports that patient lives at home alone and the family is unable to help her. She states there are difficult family dynamics between the three adult children (son was escorted out of hospital by security). She states there is no designated HCPOA. CSW explained that CSW would be contacted her and she can impart information to her other siblings, as we need one contact person. She expressed understanding that her brother acted unprofessional at the hospital but would still like to involve him in the SNF choice. Patient's daughter expressed understanding of PT recommendation and is agreeable to SNF placement at time of discharge. CSW to continue to follow and assist with discharge planning needs.   Social Worker assessment / plan:  CSW spoke with patient's daughter concerning possibility of  rehab at [facility] before returning home.  Employment status:  Retired Nurse, adult PT Recommendations:  [place] / Referral to community resources:  [place]  Patient/Family's Response to care:  [name] recognizes need for rehab before returning home and is agreeable to a SNF in [place]. CSW emailed her the list and stressed the importance of having a facility choice by 9am Friday, due to patient's insurance requiring pre-authorization and them being closed on the holiday. CSW alerted her that we will need to think of another plan if patient needs to be discharged before insurance provides authorization.   Patient/Family's Understanding of and Emotional Response to Diagnosis, Current Treatment, and Prognosis:  Patient/family is realistic regarding therapy needs and expressed being hopeful for SNF placement. Patient's daughter expressed understanding of CSW role and discharge process as well as medical condition. No questions/concerns about plan or treatment.    Emotional Assessment Appearance:  Appears stated age Attitude/Demeanor/Rapport:  Unable to Assess Affect (typically observed):  Unable to Assess Orientation:  Oriented to Self Alcohol / Substance use:  Not Applicable Psych involvement (Current and /or in the community):  No (Comment)  Discharge Needs  Concerns to be addressed:  Care Coordination Readmission within the last 30 days:  No Current discharge risk:  Cognitively Impaired Barriers to Discharge:  Continued Medical Work up   [employer], [place] 12/04/2017, 4:54 PM

## 2017-12-04 NOTE — ED Provider Notes (Signed)
Assumed care from PA Layden at shift change.  See prior note for full H&P.  Briefly 84 year old female with history of bladder prolapse with pessary in place with frequent UTIs, Crohn's disease, osteoporosis, presenting to the ED with generalized weakness, nausea, and vomiting.  She does follow with urology for her frequent UTIs.  Labs overall reassuring.  UA appears infectious.  Plan: CT pending.  If negative can discharge home with antibiotics and follow-up with PCP.  Results for orders placed or performed during the hospital encounter of 12/03/17  CBC  Result Value Ref Range   WBC 8.6 4.0 - 10.5 K/uL   RBC 5.46 (H) 3.87 - 5.11 MIL/uL   Hemoglobin 16.9 (H) 12.0 - 15.0 g/dL   HCT 50.9 (H) 36.0 - 46.0 %   MCV 93.2 78.0 - 100.0 fL   MCH 31.0 26.0 - 34.0 pg   MCHC 33.2 30.0 - 36.0 g/dL   RDW 12.3 11.5 - 15.5 %   Platelets 281 150 - 400 K/uL  Urinalysis, Routine w reflex microscopic  Result Value Ref Range   Color, Urine YELLOW YELLOW   APPearance CLOUDY (A) CLEAR   Specific Gravity, Urine 1.013 1.005 - 1.030   pH 8.0 5.0 - 8.0   Glucose, UA NEGATIVE NEGATIVE mg/dL   Hgb urine dipstick NEGATIVE NEGATIVE   Bilirubin Urine NEGATIVE NEGATIVE   Ketones, ur NEGATIVE NEGATIVE mg/dL   Protein, ur 100 (A) NEGATIVE mg/dL   Nitrite NEGATIVE NEGATIVE   Leukocytes, UA LARGE (A) NEGATIVE   RBC / HPF 6-10 0 - 5 RBC/hpf   WBC, UA >50 (H) 0 - 5 WBC/hpf   Bacteria, UA MANY (A) NONE SEEN   Squamous Epithelial / LPF 0-5 0 - 5   Amorphous Crystal PRESENT   Comprehensive metabolic panel  Result Value Ref Range   Sodium 143 135 - 145 mmol/L   Potassium 3.8 3.5 - 5.1 mmol/L   Chloride 105 98 - 111 mmol/L   CO2 28 22 - 32 mmol/L   Glucose, Bld 131 (H) 70 - 99 mg/dL   BUN 24 (H) 8 - 23 mg/dL   Creatinine, Ser 0.89 0.44 - 1.00 mg/dL   Calcium 9.8 8.9 - 10.3 mg/dL   Total Protein 7.5 6.5 - 8.1 g/dL   Albumin 3.9 3.5 - 5.0 g/dL   AST 25 15 - 41 U/L   ALT 20 0 - 44 U/L   Alkaline Phosphatase 69 38 -  126 U/L   Total Bilirubin 1.2 0.3 - 1.2 mg/dL   GFR calc non Af Amer 59 (L) >60 mL/min   GFR calc Af Amer >60 >60 mL/min   Anion gap 10 5 - 15  Lipase, blood  Result Value Ref Range   Lipase 43 11 - 51 U/L  Troponin I  Result Value Ref Range   Troponin I <0.03 <0.03 ng/mL   Ct Abdomen Pelvis W Contrast  Result Date: 12/04/2017 CLINICAL DATA:  84 y/o F; generalized weakness, nausea, vomiting, diarrhea. History of Crohn's. EXAM: CT ABDOMEN AND PELVIS WITH CONTRAST TECHNIQUE: Multidetector CT imaging of the abdomen and pelvis was performed using the standard protocol following bolus administration of intravenous contrast. CONTRAST:  169mL OMNIPAQUE IOHEXOL 300 MG/ML  SOLN COMPARISON:  11/02/2009 CT abdomen and pelvis. FINDINGS: Lower chest: Bilateral breast prostheses. Bilateral lower lobe platelike atelectasis. Small hiatal hernia. Hepatobiliary: No focal liver abnormality is seen. No gallstones, gallbladder wall thickening, or biliary dilatation. Pancreas: Unremarkable. No pancreatic ductal dilatation or surrounding inflammatory changes. Spleen:  Normal in size without focal abnormality. Adrenals/Urinary Tract: Adrenal glands are unremarkable. Kidneys are normal, without renal calculi, focal lesion, or hydronephrosis. Bladder is unremarkable. Stomach/Bowel: There is a short segment of wall thickening enhancement of distal small bowel in the lower abdomen compatible with acute inflammation (series 3, image 55). There is a downstream segment of stricture without thickening compatible with chronic sequelae of Crohn's. Additionally, there is diffuse fatty wall thickening without stricture of small bowel in the right hemiabdomen compatible with chronic inflammatory change. No obstructive or inflammatory changes of the colon. Sigmoid diverticulosis without findings of acute diverticulitis. Vascular/Lymphatic: Aortic atherosclerosis. No enlarged abdominal or pelvic lymph nodes. Reproductive: Uterus and  bilateral adnexa are unremarkable. Other: No abdominal wall hernia or abnormality. No abdominopelvic ascites. Musculoskeletal: Moderate levocurvature of the spine with apex at L2. No acute osseous abnormality identified. IMPRESSION: 1. Acute inflammation of short segment of small bowel in the lower abdomen. Downstream segment of stricture without inflammation compatible with fibrostenotic chronic Crohn's. No fistula identified. 2. Diffuse fatty wall thickening of small bowel in the right hemiabdomen indicating chronic inflammatory change. 3. Sigmoid diverticulosis without findings of acute diverticulitis. 4. Small hiatal hernia. Electronically Signed   By: Kristine Garbe M.D.   On: 12/04/2017 02:18    CT with findings of crohn's disease.   I discussed CT results with patient and her son at the bedside.  Initial plan was for discharge home with antibiotics if no acute findings on CT.  Son is somewhat uncomfortable this as she has been generally weak and he is concerned about her taking care of herself at home.  She does live alone and ambulates with a walker at baseline.   She has been taking care of herself well until recently.  She has not had any new falls or other injuries but son is concerned that it is only a matter of time before this happens due to her progressing weakness.  He is inquiring about hospital admission for 24 to 72 hours to ensure that her overall condition is improving, if not he is open to the idea of home health/rehab placement.  Patient does not want to go to rehab facility, she wants to go back home but admits she is a little too weak to care for herself at this time.  Feel hospital admission is reasonable.  Can likely get PT/OT eval in the morning.  In the meantime, will start IV Rocephin for UTI.  Discussed with Dr. Hal Hope-- Will admit for ongoing care.   Larene Pickett, PA-C 12/04/17 [id number]    Orpah Greek, MD 12/04/17 [phone]

## 2017-12-04 NOTE — Discharge Instructions (Signed)
Take antibiotics as directed. Please take all of your antibiotics until finished.  Follow-up with your primary care doctor the next 24 to 48 hours for further evaluation.  Return to the Emergency Department immediately if you experience any worsening abdominal pain, fever, persistent nausea and vomiting, inability keep any food down, pain with urination, blood in your urine or any other worsening or concerning symptoms.

## 2017-12-04 NOTE — Consult Note (Addendum)
[facility] Consult: 3:47 PM 12/04/2017  LOS: 0 days    Referring Provider: Dr Gevena Barre  Primary Care Physician:  Tonia Ghent, MD Primary Gastroenterologist:  Dr Carlean Purl    Reason for Consultation:  Abnormal CT in pt with Crohn's.     HPI: [name] is a 84 y.o. female.  Medical past history listed below.  She has a history of non-compliance.  There is been issues with her memory and decision-making in the past but she is never been deemed incapable of making medical decisions.  Hx Crohn's, diagnosed in 2010 after presenting with PSBO.  On 6 MP in past, but not currently.  She says she stopped taking mercaptopurine, though she cannot remember when, after a profit told her she did not have Crohn's disease that she had a parasite.  Last OV was in 06/2016 and supposedly taking 6 MP at that time.    Last colonoscopy 12/2008: Ileitis, sigmoid tics, int 'rrhoids   Admitted with N/V/D and diagnosed with mild CHF, UTI.   CT abd/pelvis with contrast shows 1. Acute inflammation of short segment of small bowel in the lower abdomen. Downstream segment of stricture without inflammation compatible with fibrostenotic chronic Crohn's. No fistula identified. 2. Diffuse fatty wall thickening of small bowel in the right hemiabdomen indicating chronic inflammatory change. 3. Sigmoid diverticulosis without findings of acute diverticulitis. 4. Small hiatal hernia.  On labs: no elevated WBC, no anemia(Hgb 16!).  No  Electrolyte or kidney issues.   Started on Rocephin.    Weight 110, was 109 # in 03/2017  Patient tells me she does not have nausea and vomiting very often.  The ED MD note mentions too numerous to count episodes of vomiting but the patient does not confirm this.  She denies abdominal pain.  She  denies diarrhea.  Says she has soft, formed, brown stools, never sees blood, daily.  Past Medical History:  Diagnosis Date  . Bladder prolapse, female, acquired    pessary in  . Complete uterine prolapse   . Crohn's disease ([place])    remission  . Diverticulosis   . E. coli UTI 06/09/2013  . Fall 05/2013  . Hemorrhoids   . History of hypercalcemia   . History of hyperglycemia   . Hydronephrosis   . Hyperparathyroidism   . Multiple pelvic fractures 07/02/2013  . Osteoporosis   . Pubic ramus fracture ([place])   . Urinary incontinence 09/09/2011  . Vitamin D deficiency     Past Surgical History:  Procedure Laterality Date  . COLONOSCOPY  12/23/2008   crohn's, diverticulosis, internal hemorrhoids  . HIP FRACTURE SURGERY Left    left, prosthesis  . LAPAROSCOPY  10/2009  . PARATHYROIDECTOMY Left 08/30/2013   Procedure: LEFT INFERIOR PARATHYROIDECTOMY;  Surgeon: Earnstine Regal, MD;  Location: [facility];  Service: General;  Laterality: Left;  . RHINOPLASTY    . TONSILLECTOMY      Prior to Admission medications   Medication Sig Start Date End Date Taking? Authorizing Provider  calcium-vitamin D (OSCAL WITH D) 500-200 MG-UNIT  tablet Take 1 tablet by mouth 2 (two) times daily.   Yes [provider]  cholecalciferol (VITAMIN D) 1000 units tablet Take 1 tablet (1,000 Units total) by mouth daily. 12/19/16  Yes Tonia Ghent, MD  Multiple Vitamin (MULTIVITAMIN) tablet Take 1 tablet by mouth daily.   Yes [provider]    Scheduled Meds: . enoxaparin (LOVENOX) injection  40 mg Subcutaneous Q24H  . feeding supplement (ENSURE ENLIVE)  237 mL Oral TID BM  . furosemide  20 mg Intravenous Q12H   Infusions: . [START ON 12/05/2017] cefTRIAXone (ROCEPHIN)  IV     PRN Meds: acetaminophen **OR** acetaminophen, hydrALAZINE, ondansetron **OR** ondansetron (ZOFRAN) IV   Allergies as of 12/03/2017  . (No Known Allergies)    Family History  Problem Relation Age of Onset  . Heart  disease Father   . Colon cancer Father   . Osteoporosis Mother   . Breast cancer Mother   . Prostate cancer Brother     Social History   Socioeconomic History  . Marital status: Widowed    Spouse name: Not on file  . Number of children: 3  . Years of education: Not on file  . Highest education level: Not on file  Occupational History  . Occupation: Retired  Scientific laboratory technician  . Financial resource strain: Not on file  . Food insecurity:    Worry: Not on file    Inability: Not on file  . Transportation needs:    Medical: Not on file    Non-medical: Not on file  Tobacco Use  . Smoking status: Never Smoker  . Smokeless tobacco: Never Used  Substance and Sexual Activity  . Alcohol use: Yes    Alcohol/week: 0.0 oz    Comment: occ wine  . Drug use: No  . Sexual activity: Never    Birth control/protection: Coitus interruptus    Comment: because of prolapse and pessary  Lifestyle  . Physical activity:    Days per week: Not on file    Minutes per session: Not on file  . Stress: Not on file  Relationships  . Social connections:    Talks on phone: Not on file    Gets together: Not on file    Attends religious service: Not on file    Active member of club or organization: Not on file    Attends meetings of clubs or organizations: Not on file    Relationship status: Not on file  . Intimate partner violence:    Fear of current or ex partner: Not on file    Emotionally abused: Not on file    Physically abused: Not on file    Forced sexual activity: Not on file  Other Topics Concern  . Not on file  Social History Narrative   Widowed.  Lives alone.     3 kids.     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    REVIEW OF SYSTEMS: Constitutional: Fatigue ENT:  No nose bleeds Pulm: Denies shortness of breath or cough. CV:  No palpitations, no LE edema.  GU:  No hematuria, no frequency GI: Denies anorexia, dysphasia,  abdominal pain. Heme: Denies unusual bleeding or excessive bruising. Transfusions: None ever Neuro:  No headaches, no peripheral tingling or numbness.  No syncope.  No seizures. Derm:  No itching, no rash or sores.  Endocrine:  No sweats or chills.  No polyuria or dysuria Immunization:  Patient does not receive routine vaccinations per her wishes  travel:  None beyond local counties in last few months.    PHYSICAL EXAM: Vital signs in last 24 hours: Vitals:   12/04/17 0612 12/04/17 0800  BP: (!) 203/118 (!) 166/79  Pulse: 73   Resp:  16  Temp:  97.9 F (36.6 C)  SpO2: 99% 99%   Wt Readings from Last 3 Encounters:  12/04/17 110 lb 0.2 oz (49.9 kg)  04/02/17 110 lb (49.9 kg)  03/11/17 108 lb 12 oz (49.3 kg)    General: Frail, sleepy but arousable, aged WF Head: No facial asymmetry or swelling.  No signs of head trauma. Eyes: No scleral icterus.  No conjunctival pallor. Ears: Slightly hard of hearing. Nose: No discharge Mouth: Tongue midline.  Oral mucosa pink, moist, clear. Neck: No JVD, no masses, no thyromegaly Lungs: Poor inspiratory effort but no shortness of breath or cough.  Breath sounds distant but clear. Heart: RRR.  No MRG.  S1, S2 present Abdomen: Thin, soft.  Not tender.  Not distended.  No masses, HSM, bruits, hernias.  Bowel sounds active..   Rectal: Deferred Musc/Skeltl: No joint swelling or redness.  Some arthritic changes in the fingers. Extremities: No CCE. Neurologic: Alert.  Oriented x3.  Poor recall of medical details and medications. Skin: No rash, no sores, no telangiectasia.  No significant bruising. Tattoos: None Nodes: No cervical adenopathy Psych: Flat affect.  Pleasant, cooperative.  Intake/Output from previous day: 06/26 0701 - 06/27 0700 In: 101.5 [IV Piggyback:101.5] Out: -  Intake/Output this shift: Total I/O In: 848 [P.O.:473; I.V.:375] Out: -   LAB RESULTS: Recent Labs    12/03/17 2022 12/04/17 0643  WBC 8.6 8.6  HGB 16.9*  16.0*  HCT 50.9* 47.0*  PLT 281 258   BMET Lab Results  Component Value Date   NA 142 12/04/2017   NA 143 12/03/2017   NA 140 12/17/2016   K 3.8 12/04/2017   K 3.8 12/03/2017   K 4.2 12/17/2016   CL 103 12/04/2017   CL 105 12/03/2017   CL 106 12/17/2016   CO2 30 12/04/2017   CO2 28 12/03/2017   CO2 28 12/17/2016   GLUCOSE 140 (H) 12/04/2017   GLUCOSE 131 (H) 12/03/2017   GLUCOSE 110 (H) 12/17/2016   BUN 19 12/04/2017   BUN 24 (H) 12/03/2017   BUN 24 (H) 12/17/2016   CREATININE 0.97 12/04/2017   CREATININE 0.89 12/03/2017   CREATININE 0.96 12/17/2016   CALCIUM 9.2 12/04/2017   CALCIUM 9.8 12/03/2017   CALCIUM 9.7 12/17/2016   LFT Recent Labs    12/03/17 2022 12/04/17 0643  PROT 7.5 6.7  ALBUMIN 3.9 3.6  AST 25 22  ALT 20 18  ALKPHOS 69 90  BILITOT 1.2 0.8   PT/INR Lab Results  Component Value Date   INR 1.07 11/02/2009   INR 1.17 03/18/2009   Hepatitis Panel No results for input(s): HEPBSAG, HCVAB, HEPAIGM, HEPBIGM in the last 72 hours. C-Diff No components found for: CDIFF Lipase     Component Value Date/Time   LIPASE 43 12/03/2017 2022      RADIOLOGY STUDIES: Ct Abdomen Pelvis W Contrast  Result Date: 12/04/2017 CLINICAL DATA:  84 y/o F; generalized weakness, nausea, vomiting, diarrhea. History of Crohn's. EXAM: CT ABDOMEN AND PELVIS WITH CONTRAST TECHNIQUE: Multidetector CT imaging of the abdomen and pelvis was performed using the standard protocol following bolus administration of intravenous contrast. CONTRAST:  153mL OMNIPAQUE IOHEXOL 300 MG/ML  SOLN COMPARISON:  11/02/2009 CT abdomen and pelvis. FINDINGS: Lower chest: Bilateral breast prostheses. Bilateral lower lobe platelike atelectasis. Small hiatal hernia. Hepatobiliary: No focal liver abnormality is seen. No gallstones, gallbladder wall thickening, or biliary dilatation. Pancreas: Unremarkable. No pancreatic ductal dilatation or surrounding inflammatory changes. Spleen: Normal in size  without focal abnormality. Adrenals/Urinary Tract: Adrenal glands are unremarkable. Kidneys are normal, without renal calculi, focal lesion, or hydronephrosis. Bladder is unremarkable. Stomach/Bowel: There is a short segment of wall thickening enhancement of distal small bowel in the lower abdomen compatible with acute inflammation (series 3, image 55). There is a downstream segment of stricture without thickening compatible with chronic sequelae of Crohn's. Additionally, there is diffuse fatty wall thickening without stricture of small bowel in the right hemiabdomen compatible with chronic inflammatory change. No obstructive or inflammatory changes of the colon. Sigmoid diverticulosis without findings of acute diverticulitis. Vascular/Lymphatic: Aortic atherosclerosis. No enlarged abdominal or pelvic lymph nodes. Reproductive: Uterus and bilateral adnexa are unremarkable. Other: No abdominal wall hernia or abnormality. No abdominopelvic ascites. Musculoskeletal: Moderate levocurvature of the spine with apex at L2. No acute osseous abnormality identified. IMPRESSION: 1. Acute inflammation of short segment of small bowel in the lower abdomen. Downstream segment of stricture without inflammation compatible with fibrostenotic chronic Crohn's. No fistula identified. 2. Diffuse fatty wall thickening of small bowel in the right hemiabdomen indicating chronic inflammatory change. 3. Sigmoid diverticulosis without findings of acute diverticulitis. 4. Small hiatal hernia. Electronically Signed   By: Kristine Garbe M.D.   On: 12/04/2017 02:18     IMPRESSION:   *   Crohn's ileitis.   CT scan with active ileitis and stricturing.   Ranted the patient's history may not be that reliable but her symptoms do not suggest acute Crohn's.   Her Hgb is good, though it may come down as she is hydrated.  *   UTI  *   CHF.   Echo reading pending.   *   ? Dementia?     PLAN:     *   Dr. Carlean Purl will be seeing  the patient later today.  For now did not add steroids given the lack of concrete symptoms referrable to acute ileitis  *   She is tolerating full liquid diet, I left this in place.  It is possible we can advance her to a soft diet.   Azucena Freed  12/04/2017, 3:47 PM Phone [phone]    [place] Attending   I have taken an interval history, reviewed the chart and examined the patient. I agree with the Advanced Practitioner's note, impression and recommendations.   Well known to me Off Tx x mos - yrs  I am not sure what caused current issues but think active ileal Crohn's may be part/all of it.  Abnl UA of unclear significance in the setting of Pesary/bladder prolapse and the inflamed ileal segment is adjacent to bladder.  Start steroids IV Consider budesonide at dc instead of prednisone  F/U tomorrow and will arrange out patient f/u    Gatha Mayer, MD, [facility] 12/04/2017 9:11 PM

## 2017-12-04 NOTE — H&P (Signed)
History and Physical    [name] DOB: 03/21/1933 DOA: 12/03/2017  PCP: Tonia Ghent, MD  Patient coming from: Home.  Chief Complaint: Nausea vomiting.  HPI: [name] is a 84 y.o. female with history of Crohn's disease off medications used to visit gastroenterologist Dr. Carlean Purl presents to the ER with complaints of nausea vomiting.  Patient has benign persistent nausea vomiting since yesterday morning.  Denies any abdominal pain or diarrhea.  Last bowel movement was 24 hours ago and patient status normal.  Denies fever chills no chest pain or shortness of breath or productive cough.  Denies any recent travel or eating anything abnormal.  ED Course: In the ER patient had CT abdomen and pelvis done which shows inflammation of short segment of small bowel in the lower abdomen with downstream stricture with no inflammation.  Also has features of chronic Crohn's.  UA is compatible with UTI.  Patient was started on ceftriaxone for UTI IV fluids.  Patient in addition also was complaining of generalized weakness since patient started vomiting.  Patient is found to have markedly elevated blood pressure.  As per the patient's son the patient has recently been noted to have elevated blood pressure.  Review of Systems: As per HPI, rest all negative.   Past Medical History:  Diagnosis Date  . Bladder prolapse, female, acquired    pessary in  . Complete uterine prolapse   . Crohn's disease ([place])    remission  . Diverticulosis   . E. coli UTI 06/09/2013  . Fall 05/2013  . Hemorrhoids   . History of hypercalcemia   . History of hyperglycemia   . Hydronephrosis   . Hyperparathyroidism   . Multiple pelvic fractures 07/02/2013  . Osteoporosis   . Pubic ramus fracture ([place])   . Urinary incontinence 09/09/2011  . Vitamin D deficiency     Past Surgical History:  Procedure Laterality Date  . COLONOSCOPY  12/23/2008   crohn's, diverticulosis, internal hemorrhoids  .  HIP FRACTURE SURGERY Left    left, prosthesis  . LAPAROSCOPY  10/2009  . PARATHYROIDECTOMY Left 08/30/2013   Procedure: LEFT INFERIOR PARATHYROIDECTOMY;  Surgeon: Earnstine Regal, MD;  Location: [facility];  Service: General;  Laterality: Left;  . RHINOPLASTY    . TONSILLECTOMY       reports that she has never smoked. She has never used smokeless tobacco. She reports that she drinks alcohol. She reports that she does not use drugs.  No Known Allergies  Family History  Problem Relation Age of Onset  . Heart disease Father   . Colon cancer Father   . Osteoporosis Mother   . Breast cancer Mother   . Prostate cancer Brother     Prior to Admission medications   Medication Sig Start Date End Date Taking? Authorizing Provider  calcium-vitamin D (OSCAL WITH D) 500-200 MG-UNIT tablet Take 1 tablet by mouth 2 (two) times daily.   Yes [provider]  cholecalciferol (VITAMIN D) 1000 units tablet Take 1 tablet (1,000 Units total) by mouth daily. 12/19/16  Yes Tonia Ghent, MD  Multiple Vitamin (MULTIVITAMIN) tablet Take 1 tablet by mouth daily.   Yes [provider]    Physical Exam: Vitals:   12/04/17 0200 12/04/17 0300 12/04/17 0500 12/04/17 0523  BP: (!) 182/82 (!) 171/146 (!) 176/96 (!) 195/96  Pulse: 71 69  76  Resp: 13 15  18   Temp:      TempSrc:  SpO2: 96% 96%  99%      Constitutional: Moderately built and nourished. Vitals:   12/04/17 0200 12/04/17 0300 12/04/17 0500 12/04/17 0523  BP: (!) 182/82 (!) 171/146 (!) 176/96 (!) 195/96  Pulse: 71 69  76  Resp: 13 15  18   Temp:      TempSrc:      SpO2: 96% 96%  99%   Eyes: Anicteric no pallor. ENMT: No discharge from the ears eyes nose or mouth. Neck: No mass felt.  No neck rigidity but no JVD appreciated. Respiratory: No rhonchi or crepitations. Cardiovascular: A5-W0 heard systolic murmur. Abdomen: Soft nontender bowel sounds present.  No guarding or rigidity.  No rebound tenderness. Musculoskeletal:  No edema. Skin: No rash. Neurologic: Alert awake oriented to time place and person.  Moves all extremity's. Psychiatric: Appears normal.  Normal affect.   Labs on Admission: I have personally reviewed following labs and imaging studies  CBC: Recent Labs  Lab 12/03/17 2022  WBC 8.6  HGB 16.9*  HCT 50.9*  MCV 93.2  PLT 981   Basic Metabolic Panel: Recent Labs  Lab 12/03/17 2022  NA 143  K 3.8  CL 105  CO2 28  GLUCOSE 131*  BUN 24*  CREATININE 0.89  CALCIUM 9.8   GFR: CrCl cannot be calculated (Unknown ideal weight.). Liver Function Tests: Recent Labs  Lab 12/03/17 2022  AST 25  ALT 20  ALKPHOS 69  BILITOT 1.2  PROT 7.5  ALBUMIN 3.9   Recent Labs  Lab 12/03/17 2022  LIPASE 43   No results for input(s): AMMONIA in the last 168 hours. Coagulation Profile: No results for input(s): INR, PROTIME in the last 168 hours. Cardiac Enzymes: Recent Labs  Lab 12/03/17 2022  TROPONINI <0.03   BNP (last 3 results) No results for input(s): PROBNP in the last 8760 hours. HbA1C: No results for input(s): HGBA1C in the last 72 hours. CBG: Recent Labs  Lab 12/04/17 0445  GLUCAP 115*   Lipid Profile: No results for input(s): CHOL, HDL, LDLCALC, TRIG, CHOLHDL, LDLDIRECT in the last 72 hours. Thyroid Function Tests: No results for input(s): TSH, T4TOTAL, FREET4, T3FREE, THYROIDAB in the last 72 hours. Anemia Panel: No results for input(s): VITAMINB12, FOLATE, FERRITIN, TIBC, IRON, RETICCTPCT in the last 72 hours. Urine analysis:    Component Value Date/Time   COLORURINE YELLOW 12/03/2017 2034   APPEARANCEUR CLOUDY (A) 12/03/2017 2034   LABSPEC 1.013 12/03/2017 2034   PHURINE 8.0 12/03/2017 2034   GLUCOSEU NEGATIVE 12/03/2017 2034   HGBUR NEGATIVE 12/03/2017 2034   BILIRUBINUR NEGATIVE 12/03/2017 2034   BILIRUBINUR + 07/14/2015 [place] 12/03/2017 2034   PROTEINUR 100 (A) 12/03/2017 2034   UROBILINOGEN negative 07/14/2015 1655    UROBILINOGEN 0.2 05/16/2013 1434   NITRITE NEGATIVE 12/03/2017 2034   LEUKOCYTESUR LARGE (A) 12/03/2017 2034   Sepsis Labs: @LABRCNTIP (procalcitonin:4,lacticidven:4) )No results found for this or any previous visit (from the past 240 hour(s)).   Radiological Exams on Admission: Ct Abdomen Pelvis W Contrast  Result Date: 12/04/2017 CLINICAL DATA:  84 y/o F; generalized weakness, nausea, vomiting, diarrhea. History of Crohn's. EXAM: CT ABDOMEN AND PELVIS WITH CONTRAST TECHNIQUE: Multidetector CT imaging of the abdomen and pelvis was performed using the standard protocol following bolus administration of intravenous contrast. CONTRAST:  178m OMNIPAQUE IOHEXOL 300 MG/ML  SOLN COMPARISON:  11/02/2009 CT abdomen and pelvis. FINDINGS: Lower chest: Bilateral breast prostheses. Bilateral lower lobe platelike atelectasis. Small hiatal hernia. Hepatobiliary: No focal liver abnormality is seen.  No gallstones, gallbladder wall thickening, or biliary dilatation. Pancreas: Unremarkable. No pancreatic ductal dilatation or surrounding inflammatory changes. Spleen: Normal in size without focal abnormality. Adrenals/Urinary Tract: Adrenal glands are unremarkable. Kidneys are normal, without renal calculi, focal lesion, or hydronephrosis. Bladder is unremarkable. Stomach/Bowel: There is a short segment of wall thickening enhancement of distal small bowel in the lower abdomen compatible with acute inflammation (series 3, image 55). There is a downstream segment of stricture without thickening compatible with chronic sequelae of Crohn's. Additionally, there is diffuse fatty wall thickening without stricture of small bowel in the right hemiabdomen compatible with chronic inflammatory change. No obstructive or inflammatory changes of the colon. Sigmoid diverticulosis without findings of acute diverticulitis. Vascular/Lymphatic: Aortic atherosclerosis. No enlarged abdominal or pelvic lymph nodes. Reproductive: Uterus and  bilateral adnexa are unremarkable. Other: No abdominal wall hernia or abnormality. No abdominopelvic ascites. Musculoskeletal: Moderate levocurvature of the spine with apex at L2. No acute osseous abnormality identified. IMPRESSION: 1. Acute inflammation of short segment of small bowel in the lower abdomen. Downstream segment of stricture without inflammation compatible with fibrostenotic chronic Crohn's. No fistula identified. 2. Diffuse fatty wall thickening of small bowel in the right hemiabdomen indicating chronic inflammatory change. 3. Sigmoid diverticulosis without findings of acute diverticulitis. 4. Small hiatal hernia. Electronically Signed   By: Kristine Garbe M.D.   On: 12/04/2017 02:18      Assessment/Plan Principal Problem:   Non-intractable vomiting with nausea Active Problems:   CROHN'S DISEASE, SMALL INTESTINE   Weakness   Acute cystitis without hematuria   Elevated blood pressure reading   Nausea & vomiting    1. Nausea vomiting with CAT scan showing acute inflammation of the short segment of the small bowel with known history of Crohn's -abdomen appears benign at this time.  Will keep patient on liquid diet.  Consult Dr. Judith Part gastroenterologist in a.m. for further recommendation with regard to vomiting with history of Crohn's. 2. UTI on ceftriaxone follow cultures. 3. Generalized weakness -get physical therapy consult and social worker consult.  May need placement.  On IV fluids for now.  Check orthostatics in a.m.  Denies any chest pain or shortness of breath. 4. Systolic murmur -denies any shortness of breath on chest pain.  May need further work-up as outpatient. 5. Hypertensive urgency/elevated blood pressure -patient does not have any previous diagnosis of hypertension.  For now I have kept patient on PRN IV hydralazine.  Closely follow blood pressure trend.   DVT prophylaxis: Lovenox. Code Status: Full code. Family Communication: Patient's  son. Disposition Plan: Need to be determined. Consults called: Physical therapy. Admission status: Observation.   Rise Patience MD [facility] Pager [phone].  If 7PM-7AM, please contact night-coverage [email or web address] Password [email or web address]  12/04/2017, 5:39 AM

## 2017-12-04 NOTE — NC FL2 (Signed)
[place] LEVEL OF CARE SCREENING TOOL     IDENTIFICATION  Patient Name: [name] Birthdate: [birth year] Sex: female Admission Date (Current Location): 12/03/2017  [facility] and [place] Number:  Herbalist and Address:  [facility] [place], [place] [address], [place], [place] [address]      Provider Number: [id number]  Attending Physician Name and Address:  Annita Brod, MD  Relative Name and Phone Number:  [name], daughter, [phone]    Current Level of Care: Hospital Recommended Level of Care: [place] Prior Approval Number:    Date Approved/Denied:   PASRR Number: [id number]  Discharge Plan: SNF    Current Diagnoses: Patient Active Problem List   Diagnosis Date Noted  . Weakness 12/04/2017  . Non-intractable vomiting with nausea 12/04/2017  . Acute cystitis without hematuria 12/04/2017  . Elevated blood pressure reading 12/04/2017  . Nausea & vomiting 12/04/2017  . Chronic diastolic CHF (congestive heart failure) ([place]) 12/04/2017  . Malnutrition of moderate degree 12/04/2017  . Chest wall pain 04/03/2017  . Shingles 03/12/2017  . Healthcare maintenance 12/18/2016  . Advance care planning 12/18/2016  . Multiple pelvic fractures 07/02/2013  . Urinary incontinence 09/09/2011  . Osteoporosis 10/17/2009  . Primary hyperparathyroidism ([place]) 10/02/2009  . Vitamin D deficiency 01/06/2009  . CROHN'S DISEASE, SMALL INTESTINE 01/04/2009  . Bladder prolapse, female, acquired 01/04/2009    Orientation RESPIRATION BLADDER Height & Weight     Self  Normal Incontinent Weight: 49.9 kg (110 lb 0.2 oz)(from Oct 2018 records) Height:  5' 1.5" (156.2 cm)  BEHAVIORAL SYMPTOMS/MOOD NEUROLOGICAL BOWEL NUTRITION STATUS      Continent Diet(Please see DC Summary)  AMBULATORY STATUS COMMUNICATION OF NEEDS Skin   Limited Assist Verbally Normal                       Personal Care Assistance Level of Assistance   Bathing, Feeding, Dressing Bathing Assistance: Limited assistance Feeding assistance: Limited assistance Dressing Assistance: Limited assistance     Functional Limitations Info  Sight, Hearing, Speech Sight Info: Adequate Hearing Info: Adequate Speech Info: Adequate    SPECIAL CARE FACTORS FREQUENCY  PT (By licensed PT), OT (By licensed OT)     PT Frequency: 5x/week OT Frequency: 3x/week            Contractures Contractures Info: Not present    Additional Factors Info  Code Status, Allergies Code Status Info: Full Allergies Info: NKA           Current Medications (12/04/2017):  This is the current hospital active medication list Current Facility-Administered Medications  Medication Dose Route Frequency Provider Last Rate Last Dose  . acetaminophen (TYLENOL) tablet 650 mg  650 mg Oral Q6H PRN Rise Patience, MD       Or  . acetaminophen (TYLENOL) suppository 650 mg  650 mg Rectal Q6H PRN Rise Patience, MD      . Derrill Memo ON 12/05/2017] cefTRIAXone (ROCEPHIN) 1 g in sodium chloride 0.9 % 100 mL IVPB  1 g Intravenous Q24H Rise Patience, MD      . enoxaparin (LOVENOX) injection 40 mg  40 mg Subcutaneous Q24H Rise Patience, MD      . feeding supplement (ENSURE ENLIVE) (ENSURE ENLIVE) liquid 237 mL  237 mL Oral TID BM Annita Brod, MD      . furosemide (LASIX) injection 20 mg  20 mg Intravenous Q12H Annita Brod, MD      .  hydrALAZINE (APRESOLINE) injection 5 mg  5 mg Intravenous Q4H PRN Rise Patience, MD   5 mg at 12/04/17 0441  . ondansetron (ZOFRAN) tablet 4 mg  4 mg Oral Q6H PRN Rise Patience, MD       Or  . ondansetron [facility]) injection 4 mg  4 mg Intravenous Q6H PRN Rise Patience, MD   4 mg at 12/04/17 1450     Discharge Medications: Please see discharge summary for a list of discharge medications.  Relevant Imaging Results:  Relevant Lab Results:   Additional Information SSN: [place], [place]

## 2017-12-04 NOTE — Progress Notes (Addendum)
Received call from ED nurse Seth Bake that staff members had overheard pt's son talking abusively toward patient and yelling at her. Also received report from NT on [place] that patient's son had begun yelling at her when RN left the room. Pt's son was asked to leave room and subsequently to leave the floor. He refused to leave the floor and security was called to escort him. Pt declines to put a safety password on at this time. Pt's son was also observed by NT on the floor taking pictures of staff without consent while they were interacting with the patient. Administrative Coordinator Shanon Brow notified about situation.

## 2017-12-04 NOTE — ED Provider Notes (Signed)
Since seen and evaluated.  Somewhat evasive about her symptoms but per daughter has been vomiting today after drinking some 40-day-old expired milk.  Has some tenderness in her right lower quadrant.  No leukocytosis or fever.  Agree with imaging and symptom control.  Wears pessary for bladder collapse.  Has multiple white blood cells and bacteria.  Has had UTIs in the past.  She, and daughter are both uncertain if she has been symptomatic.  Agree with symptom control and imaging.  If negative and tolerating p.o. could potentially treat outpatient with p.o. antibiotics.  If not tolerating p.o. may require admission.   Tanna Furry, MD 12/04/17 Dyann Kief

## 2017-12-04 NOTE — Evaluation (Signed)
Physical Therapy Evaluation Patient Details  Name: [name] MRN: [id number] DOB: April 07, 1933 Today's Date: 12/04/2017   History of Present Illness  Patient is a 84 y/o female admitted wtih nausea and vomitting for ~2 days. In the ER patient had CT abdomen and pelvis done which shows inflammation of short segment of small bowel in the lower abdomen with downstream stricture with no inflammation.  Also has features of chronic Crohn's.  UA is compatible with UTI. Patient with a PMH significant for Crohn's disease.  Clinical Impression  [name] is a very pleasant 84 y/o female admitted with the above listed diagnosis. Patient today AxO x 4, however, difficulties with short term memory needed consistent redirection and verbal cues for all activity for general safety. Patient reporting that prior to admission she lived alone at home and was independent with mobility. Patient today requiring Min A/Min guard for all transfers and mobility for safety as patient demonstrates poor dynamic balance with high risk of falls. Currently recommending short term SNF due to reduced functional mobility and safety. PT to continue to follow acutely.     Follow Up Recommendations SNF;Supervision/Assistance - 24 hour    Equipment Recommendations  Other (comment)(TBD at next venue of care)    Recommendations for Other Services       Precautions / Restrictions Precautions Precautions: Fall Restrictions Weight Bearing Restrictions: No      Mobility  Bed Mobility Overal bed mobility: Needs Assistance Bed Mobility: Supine to Sit     Supine to sit: Min assist     General bed mobility comments: for LE management  Transfers Overall transfer level: Needs assistance Equipment used: Rolling walker (2 wheeled) Transfers: Sit to/from Stand Sit to Stand: Min assist            Ambulation/Gait Ambulation/Gait assistance: Min assist;Min guard Gait Distance (Feet): 80 Feet Assistive device: Rolling  walker (2 wheeled) Gait Pattern/deviations: Step-to pattern;Decreased stride length;Drifts right/left;Trunk flexed Gait velocity: decreased   General Gait Details: Min A for RW management with patient demonstrating poor safety awareness and poor awareness of surroundings; unsteady gait patter  Stairs            Wheelchair Mobility    Modified Rankin (Stroke Patients Only)       Balance Overall balance assessment: Needs assistance Sitting-balance support: No upper extremity supported;Feet supported Sitting balance-Leahy Scale: Fair     Standing balance support: Bilateral upper extremity supported;During functional activity Standing balance-Leahy Scale: Poor                               Pertinent Vitals/Pain Pain Assessment: No/denies pain    Home Living Family/patient expects to be discharged to:: Private residence Living Arrangements: Alone   Type of Home: House Home Access: Stairs to enter Entrance Stairs-Rails: None Entrance Stairs-Number of Steps: 2 Home Layout: Two level Home Equipment: None Additional Comments: unsure due to confusion    Prior Function Level of Independence: Independent               Hand Dominance        Extremity/Trunk Assessment        Lower Extremity Assessment Lower Extremity Assessment: Generalized weakness    Cervical / Trunk Assessment Cervical / Trunk Assessment: Kyphotic  Communication   Communication: No difficulties  Cognition Arousal/Alertness: Awake/alert Behavior During Therapy: WFL for tasks assessed/performed Overall Cognitive Status: Impaired/Different from baseline Area of Impairment: Attention;Memory;Following commands;Safety/judgement;Problem solving  Current Attention Level: Selective Memory: Decreased short-term memory Following Commands: Follows one step commands with increased time Safety/Judgement: Decreased awareness of safety;Decreased awareness of  deficits   Problem Solving: Slow processing;Decreased initiation;Difficulty sequencing;Requires verbal cues;Requires tactile cues        General Comments      Exercises     Assessment/Plan    PT Assessment Patient needs continued PT services  PT Problem List Decreased strength;Decreased activity tolerance;Decreased balance;Decreased mobility;Decreased knowledge of use of DME;Decreased safety awareness;Decreased knowledge of precautions       PT Treatment Interventions DME instruction;Gait training;Stair training;Functional mobility training;Therapeutic activities;Therapeutic exercise;Balance training;Patient/family education;Neuromuscular re-education    PT Goals (Current goals can be found in the Care Plan section)  Acute Rehab PT Goals Patient Stated Goal: return home PT Goal Formulation: With patient Time For Goal Achievement: 12/18/17 Potential to Achieve Goals: Good    Frequency Min 3X/week(to progress towards home as patient may refuse SNF)   Barriers to discharge Decreased caregiver support      Co-evaluation               AM-PAC PT "6 Clicks" Daily Activity  Outcome Measure Difficulty turning over in bed (including adjusting bedclothes, sheets and blankets)?: A Little Difficulty moving from lying on back to sitting on the side of the bed? : Unable Difficulty sitting down on and standing up from a chair with arms (e.g., wheelchair, bedside commode, etc,.)?: Unable Help needed moving to and from a bed to chair (including a wheelchair)?: A Little Help needed walking in hospital room?: A Little Help needed climbing 3-5 steps with a railing? : A Little 6 Click Score: 14    End of Session Equipment Utilized During Treatment: Gait belt Activity Tolerance: Patient tolerated treatment well Patient left: in chair;with call bell/phone within reach;with chair alarm set;with family/visitor present Nurse Communication: Mobility status PT Visit Diagnosis:  Unsteadiness on feet (R26.81);Other abnormalities of gait and mobility (R26.89);Muscle weakness (generalized) (M62.81)    Time: 8421-0312 PT Time Calculation (min) (ACUTE ONLY): 35 min   Charges:   PT Evaluation $PT Eval Moderate Complexity: 1 Mod PT Treatments $Gait Training: 8-22 mins   PT G Codes:        Lanney Gins, PT, DPT 12/04/17 3:06 PM

## 2017-12-04 NOTE — Progress Notes (Signed)
Pt continues to be confused.  Repeating questions and anxiety is increasing.  Reoriented pt and attempt to redirect and give explanations.

## 2017-12-04 NOTE — Progress Notes (Signed)
Initial Nutrition Assessment  DOCUMENTATION CODES:   Non-severe (moderate) malnutrition in context of chronic illness  INTERVENTION:   Ensure Enlive po TID, each supplement provides 350 kcal and 20 grams of protein  NUTRITION DIAGNOSIS:   Moderate Malnutrition related to chronic illness(uncontrolled Crohn's) as evidenced by energy intake < 75% for > or equal to 1 month, moderate muscle depletion, severe muscle depletion.  GOAL:   Patient will meet greater than or equal to 90% of their needs  MONITOR:   PO intake, Supplement acceptance, Weight trends, Labs  REASON FOR ASSESSMENT:   Consult Assessment of nutrition requirement/status  ASSESSMENT:   Patient with PMH significant for Crohn's disease (non-compliant with medication), diverticulosis, and osteoporosis. Presents this admission with complaints of nausea/vomiting. CT scan revealed acute inflammation of short segment of small bowel.    Spoke with daughter at bedside. Pt's intake reported to decline after she began living by herself years ago. She has a long history of Crohn's but stopped taking her medication 2-3 years ago for religious reasons. Her daily intake consist of two meals that contain a wide variety of foods. Daughter describes pt as a "binge eater". She won't eat much during the day and binge on potato salad or pimento cheese at night. She drinks four Regular Ensures each day. Pt currently on a full liquid diet. She attempted to drink broth and eat jello this morning, but had resulting 200 ml emesis immediatly after. RD to order supplements to maximize calories and protein.   Pt endorses a UBW of 115 lb. Records indicate pt has maintained her current body weight of 107-11 lb for over a year. Nutrition-Focused physical exam completed.   Medications reviewed.  Labs reviewed.   NUTRITION - FOCUSED PHYSICAL EXAM:    Most Recent Value  Orbital Region  No depletion  Upper Arm Region  Mild depletion  Thoracic and  Lumbar Region  Unable to assess  Buccal Region  No depletion  Temple Region  Moderate depletion  Clavicle Bone Region  Severe depletion  Clavicle and Acromion Bone Region  Severe depletion  Scapular Bone Region  Unable to assess  Dorsal Hand  Moderate depletion  Patellar Region  Moderate depletion  Anterior Thigh Region  Moderate depletion  Posterior Calf Region  Moderate depletion  Edema (RD Assessment)  None  Hair  Reviewed  Eyes  Reviewed  Mouth  Reviewed  Skin  Reviewed  Nails  Reviewed     Diet Order:   Diet Order           Diet full liquid Room service appropriate? Yes; Fluid consistency: Thin  Diet effective now          EDUCATION NEEDS:   Education needs have been addressed  Skin:  Skin Assessment: Reviewed RN Assessment  Last BM:  PTA  Height:   Ht Readings from Last 1 Encounters:  12/04/17 5' 1.5" (1.562 m)    Weight:   Wt Readings from Last 1 Encounters:  12/04/17 110 lb 0.2 oz (49.9 kg)    Ideal Body Weight:  47.7 kg  BMI:  Body mass index is 20.45 kg/m.  Estimated Nutritional Needs:   Kcal:  1250-1450 kcal  Protein:  65-75 g  Fluid:  >1.2 L/day    Mariana Single RD, LDN Clinical Nutrition Pager # [phone]

## 2017-12-04 NOTE — Progress Notes (Signed)
PROGRESS NOTE  [name] KCL:[id number] DOB: 02/03/1933 DOA: 12/03/2017 PCP: Tonia Ghent, MD  HPI/Recap of past 37 hours: 84 year old female with past medical history of Crohn's disease and diastolic heart failure presented to the emergency room on the evening of 6/26 with 1 day history of nausea and vomiting.  No abdominal pain or diarrhea.  In the emergency room, patient found to have elevated blood pressures with a systolic ranging [id number]-[id number], tract infection and inflammation of the short segment of the small bowel of the lower abdomen with downstream stricture.  Patient started on gentle IV fluids, IV Rocephin.  She was seen following admission later in the morning.  She is feeling very tired, but denies any abdominal pain.  She says she is feeling a little bit better.  Assessment/Plan: Principal Problem:   Non-intractable vomiting with nausea Active Problems:   Weakness: Physical therapy to see.   Acute cystitis without hematuria/Urinary tract infection: IV Rocephin  Essential hypertension in the setting of patient with chronic diastolic heart failure: Echocardiogram from 2010 notes grade 2 diastolic dysfunction.  Noted large holosystolic murmur concerning for aortic stenosis.  BNP pending.  Also checking 2D echocardiogram.  PRN hydralazine, patient may benefit from some IV Lasix.  At this time, does not visibly look volume overloaded History of Crohn's disease, small intestine now presenting with nausea & vomiting: May be from abdominal CT findings versus UTI.  Given acute inflammation findings, will ask GI for consultation.   Code Status: Full code  Family Communication: Left message for daughter  Disposition Plan: Potential discharge once tolerating p.o., heart failure status evaluated   Consultants:  Gastroenterology  Procedures:  Echocardiogram ordered  Antimicrobials:  IV Rocephin 6/27-present  DVT prophylaxis: Lovenox   Objective: Vitals:   12/04/17 0500 12/04/17 0523 12/04/17 0612 12/04/17 0800  BP: (!) 176/96 (!) 195/96 (!) 203/118 (!) 166/79  Pulse:  76 73   Resp:  18  16  Temp:    97.9 F (36.6 C)  TempSrc:    Oral  SpO2:  99% 99% 99%  Weight:  49.9 kg (110 lb 0.2 oz)    Height:  5' 1.5" (1.562 m)      Intake/Output Summary (Last 24 hours) at 12/04/2017 0847 Last data filed at 12/04/2017 0418 Gross per 24 hour  Intake 101.46 ml  Output -  Net 101.46 ml   Filed Weights   12/04/17 0523  Weight: 49.9 kg (110 lb 0.2 oz)   Body mass index is 20.45 kg/m.  Exam:   General: Alert and oriented x2, no acute distress, fatigued  HEENT, normocephalic and atraumatic, mucous members slightly dry  Neck: Supple, no JVD  Cardiovascular: Regular rate and rhythm, 3 out of 6 holosystolic murmur  Respiratory: Clear to auscultation bilaterally  Abdomen: Soft, mild tenderness in the right side, hypoactive bowel sounds  Musculoskeletal: No clubbing or cyanosis or edema  Skin: No skin breaks, tears or lesions  Psychiatry: Appropriate, no evidence of psychoses  Neuro: No focal deficits   Data Reviewed: CBC: Recent Labs  Lab 12/03/17 2022 12/04/17 0643  WBC 8.6 8.6  NEUTROABS  --  7.2  HGB 16.9* 16.0*  HCT 50.9* 47.0*  MCV 93.2 92.7  PLT 281 916   Basic Metabolic Panel: Recent Labs  Lab 12/03/17 2022 12/04/17 0643  NA 143 142  K 3.8 3.8  CL 105 103  CO2 28 30  GLUCOSE 131* 140*  BUN 24* 19  CREATININE 0.89 0.97  CALCIUM 9.8 9.2  GFR: Estimated Creatinine Clearance: 34.6 mL/min (by C-G formula based on SCr of 0.97 mg/dL). Liver Function Tests: Recent Labs  Lab 12/03/17 2022 12/04/17 0643  AST 25 22  ALT 20 18  ALKPHOS 69 90  BILITOT 1.2 0.8  PROT 7.5 6.7  ALBUMIN 3.9 3.6   Recent Labs  Lab 12/03/17 2022  LIPASE 43   No results for input(s): AMMONIA in the last 168 hours. Coagulation Profile: No results for input(s): INR, PROTIME in the last 168 hours. Cardiac Enzymes: Recent Labs   Lab 12/03/17 2022  TROPONINI <0.03   BNP (last 3 results) No results for input(s): PROBNP in the last 8760 hours. HbA1C: No results for input(s): HGBA1C in the last 72 hours. CBG: Recent Labs  Lab 12/04/17 0445  GLUCAP 115*   Lipid Profile: No results for input(s): CHOL, HDL, LDLCALC, TRIG, CHOLHDL, LDLDIRECT in the last 72 hours. Thyroid Function Tests: No results for input(s): TSH, T4TOTAL, FREET4, T3FREE, THYROIDAB in the last 72 hours. Anemia Panel: No results for input(s): VITAMINB12, FOLATE, FERRITIN, TIBC, IRON, RETICCTPCT in the last 72 hours. Urine analysis:    Component Value Date/Time   COLORURINE YELLOW 12/03/2017 2034   APPEARANCEUR CLOUDY (A) 12/03/2017 2034   LABSPEC 1.013 12/03/2017 2034   PHURINE 8.0 12/03/2017 2034   GLUCOSEU NEGATIVE 12/03/2017 2034   HGBUR NEGATIVE 12/03/2017 2034   BILIRUBINUR NEGATIVE 12/03/2017 2034   BILIRUBINUR + 07/14/2015 [place] 12/03/2017 2034   PROTEINUR 100 (A) 12/03/2017 2034   UROBILINOGEN negative 07/14/2015 1655   UROBILINOGEN 0.2 05/16/2013 1434   NITRITE NEGATIVE 12/03/2017 2034   LEUKOCYTESUR LARGE (A) 12/03/2017 2034   Sepsis Labs: @LABRCNTIP (procalcitonin:4,lacticidven:4)  )No results found for this or any previous visit (from the past 240 hour(s)).    Studies: Ct Abdomen Pelvis W Contrast  Result Date: 12/04/2017 CLINICAL DATA:  84 y/o F; generalized weakness, nausea, vomiting, diarrhea. History of Crohn's. EXAM: CT ABDOMEN AND PELVIS WITH CONTRAST TECHNIQUE: Multidetector CT imaging of the abdomen and pelvis was performed using the standard protocol following bolus administration of intravenous contrast. CONTRAST:  146m OMNIPAQUE IOHEXOL 300 MG/ML  SOLN COMPARISON:  11/02/2009 CT abdomen and pelvis. FINDINGS: Lower chest: Bilateral breast prostheses. Bilateral lower lobe platelike atelectasis. Small hiatal hernia. Hepatobiliary: No focal liver abnormality is seen. No gallstones, gallbladder  wall thickening, or biliary dilatation. Pancreas: Unremarkable. No pancreatic ductal dilatation or surrounding inflammatory changes. Spleen: Normal in size without focal abnormality. Adrenals/Urinary Tract: Adrenal glands are unremarkable. Kidneys are normal, without renal calculi, focal lesion, or hydronephrosis. Bladder is unremarkable. Stomach/Bowel: There is a short segment of wall thickening enhancement of distal small bowel in the lower abdomen compatible with acute inflammation (series 3, image 55). There is a downstream segment of stricture without thickening compatible with chronic sequelae of Crohn's. Additionally, there is diffuse fatty wall thickening without stricture of small bowel in the right hemiabdomen compatible with chronic inflammatory change. No obstructive or inflammatory changes of the colon. Sigmoid diverticulosis without findings of acute diverticulitis. Vascular/Lymphatic: Aortic atherosclerosis. No enlarged abdominal or pelvic lymph nodes. Reproductive: Uterus and bilateral adnexa are unremarkable. Other: No abdominal wall hernia or abnormality. No abdominopelvic ascites. Musculoskeletal: Moderate levocurvature of the spine with apex at L2. No acute osseous abnormality identified. IMPRESSION: 1. Acute inflammation of short segment of small bowel in the lower abdomen. Downstream segment of stricture without inflammation compatible with fibrostenotic chronic Crohn's. No fistula identified. 2. Diffuse fatty wall thickening of small bowel in the right hemiabdomen indicating  chronic inflammatory change. 3. Sigmoid diverticulosis without findings of acute diverticulitis. 4. Small hiatal hernia. Electronically Signed   By: Kristine Garbe M.D.   On: 12/04/2017 02:18    Scheduled Meds: . enoxaparin (LOVENOX) injection  40 mg Subcutaneous Q24H  . iopamidol        Continuous Infusions: . sodium chloride    . [START ON 12/05/2017] cefTRIAXone (ROCEPHIN)  IV       LOS: 0 days      Annita Brod, MD [facility]  To reach me or the doctor on call, go to: [email or web address] Password [facility]  12/04/2017, 8:47 AM

## 2017-12-05 DIAGNOSIS — Z96642 Presence of left artificial hip joint: Secondary | ICD-10-CM | POA: Diagnosis present

## 2017-12-05 DIAGNOSIS — K50018 Crohn's disease of small intestine with other complication: Secondary | ICD-10-CM | POA: Diagnosis not present

## 2017-12-05 DIAGNOSIS — I160 Hypertensive urgency: Secondary | ICD-10-CM | POA: Diagnosis present

## 2017-12-05 DIAGNOSIS — I110 Hypertensive heart disease with heart failure: Secondary | ICD-10-CM | POA: Diagnosis present

## 2017-12-05 DIAGNOSIS — N390 Urinary tract infection, site not specified: Secondary | ICD-10-CM | POA: Diagnosis not present

## 2017-12-05 DIAGNOSIS — Z9114 Patient's other noncompliance with medication regimen: Secondary | ICD-10-CM | POA: Diagnosis not present

## 2017-12-05 DIAGNOSIS — K5730 Diverticulosis of large intestine without perforation or abscess without bleeding: Secondary | ICD-10-CM | POA: Diagnosis present

## 2017-12-05 DIAGNOSIS — M2550 Pain in unspecified joint: Secondary | ICD-10-CM | POA: Diagnosis not present

## 2017-12-05 DIAGNOSIS — Z7401 Bed confinement status: Secondary | ICD-10-CM | POA: Diagnosis not present

## 2017-12-05 DIAGNOSIS — E440 Moderate protein-calorie malnutrition: Secondary | ICD-10-CM

## 2017-12-05 DIAGNOSIS — Z8249 Family history of ischemic heart disease and other diseases of the circulatory system: Secondary | ICD-10-CM | POA: Diagnosis not present

## 2017-12-05 DIAGNOSIS — I10 Essential (primary) hypertension: Secondary | ICD-10-CM | POA: Diagnosis not present

## 2017-12-05 DIAGNOSIS — Z8262 Family history of osteoporosis: Secondary | ICD-10-CM | POA: Diagnosis not present

## 2017-12-05 DIAGNOSIS — Z803 Family history of malignant neoplasm of breast: Secondary | ICD-10-CM | POA: Diagnosis not present

## 2017-12-05 DIAGNOSIS — E875 Hyperkalemia: Secondary | ICD-10-CM | POA: Diagnosis present

## 2017-12-05 DIAGNOSIS — I5031 Acute diastolic (congestive) heart failure: Secondary | ICD-10-CM | POA: Diagnosis not present

## 2017-12-05 DIAGNOSIS — B9620 Unspecified Escherichia coli [E. coli] as the cause of diseases classified elsewhere: Secondary | ICD-10-CM | POA: Diagnosis present

## 2017-12-05 DIAGNOSIS — Z800 Family history of malignant neoplasm of digestive organs: Secondary | ICD-10-CM | POA: Diagnosis not present

## 2017-12-05 DIAGNOSIS — Z8042 Family history of malignant neoplasm of prostate: Secondary | ICD-10-CM | POA: Diagnosis not present

## 2017-12-05 DIAGNOSIS — K449 Diaphragmatic hernia without obstruction or gangrene: Secondary | ICD-10-CM | POA: Diagnosis present

## 2017-12-05 DIAGNOSIS — K50019 Crohn's disease of small intestine with unspecified complications: Secondary | ICD-10-CM | POA: Diagnosis not present

## 2017-12-05 DIAGNOSIS — K5000 Crohn's disease of small intestine without complications: Secondary | ICD-10-CM | POA: Diagnosis not present

## 2017-12-05 DIAGNOSIS — N3000 Acute cystitis without hematuria: Secondary | ICD-10-CM | POA: Diagnosis not present

## 2017-12-05 DIAGNOSIS — Z66 Do not resuscitate: Secondary | ICD-10-CM | POA: Diagnosis not present

## 2017-12-05 DIAGNOSIS — I5033 Acute on chronic diastolic (congestive) heart failure: Secondary | ICD-10-CM | POA: Diagnosis present

## 2017-12-05 DIAGNOSIS — M810 Age-related osteoporosis without current pathological fracture: Secondary | ICD-10-CM | POA: Diagnosis present

## 2017-12-05 DIAGNOSIS — I5032 Chronic diastolic (congestive) heart failure: Secondary | ICD-10-CM | POA: Diagnosis not present

## 2017-12-05 LAB — BASIC METABOLIC PANEL
ANION GAP: 6 (ref 5–15)
BUN: 16 mg/dL (ref 8–23)
CALCIUM: 8.7 mg/dL — AB (ref 8.9–10.3)
CHLORIDE: 107 mmol/L (ref 98–111)
CO2: 27 mmol/L (ref 22–32)
Creatinine, Ser: 0.95 mg/dL (ref 0.44–1.00)
GFR calc non Af Amer: 54 mL/min — ABNORMAL LOW (ref >60)
GLUCOSE: 117 mg/dL — AB (ref 70–99)
POTASSIUM: 5.2 mmol/L — AB (ref 3.5–5.1)
Sodium: 140 mmol/L (ref 135–145)

## 2017-12-05 MED ORDER — BUDESONIDE 3 MG PO CPEP
9.0000 mg | ORAL_CAPSULE | Freq: Every day | ORAL | Status: DC
  Administered 2017-12-05 – 2017-12-11 (×7): 9 mg via ORAL
  Filled 2017-12-05 (×7): qty 3

## 2017-12-05 MED ORDER — LOSARTAN POTASSIUM 50 MG PO TABS
25.0000 mg | ORAL_TABLET | Freq: Every day | ORAL | Status: DC
  Administered 2017-12-05 – 2017-12-11 (×7): 25 mg via ORAL
  Filled 2017-12-05 (×7): qty 1

## 2017-12-05 NOTE — Progress Notes (Addendum)
          Daily Rounding Note  12/05/2017, 9:35 AM  LOS: 0 days   SUBJECTIVE:   Chief complaint: none except wants to eat solid food, no problems with these PTA.  No BM today.  No abd pain, no nausea.       OBJECTIVE:         Vital signs in last 24 hours:    Temp:  [98 F (36.7 C)-98.2 F (36.8 C)] 98.1 F (36.7 C) (06/28 0809) Pulse Rate:  [68-83] 72 (06/28 0655) Resp:  [15-18] 18 (06/28 0809) BP: (146-181)/(71-95) 181/81 (06/28 0809) SpO2:  [95 %-99 %] 99 % (06/28 0809) Weight:  [112 lb 14 oz (51.2 kg)] 112 lb 14 oz (51.2 kg) (06/28 0811) Last BM Date: (pta) Filed Weights   12/04/17 0523 12/05/17 0811  Weight: 110 lb 0.2 oz (49.9 kg) 112 lb 14 oz (51.2 kg)   General: alert, conversant.looks well   Heart: RRR with soft 1/6 SEM Chest: clear bil.   Abdomen: mild distention, slightly tense in seated position.  NT.  Normal, active BS  Extremities: no CCE Neuro/Psych:  Alert.  Did not remember me from yesterday,  Oriented to place, time, self. No physical weakness apparent.    Lab Results: Recent Labs    12/03/17 2022 12/04/17 0643  WBC 8.6 8.6  HGB 16.9* 16.0*  HCT 50.9* 47.0*  PLT 281 258   BMET Recent Labs    12/03/17 2022 12/04/17 0643 12/05/17 0652  NA 143 142 140  K 3.8 3.8 5.2*  CL 105 103 107  CO2 28 30 27   GLUCOSE 131* 140* 117*  BUN 24* 19 16  CREATININE 0.89 0.97 0.95  CALCIUM 9.8 9.2 8.7*   LFT Recent Labs    12/03/17 2022 12/04/17 0643  PROT 7.5 6.7  ALBUMIN 3.9 3.6  AST 25 22  ALT 20 18  ALKPHOS 69 90  BILITOT 1.2 0.8    Studies/Results:   ASSESMENT:   *   Crohns ileitis.   Off meds (her decision) for at least several months to > 1 year.  Poor historian but may have occasional vomiting, no pain, no diarrhea or constipation.    *   UTI in pt with Pessary.    *   Mild CHF.  EF 70 to 82%, grade 1 diastolic dysfx.   Mild AoV regurge.    *   Hyperkalemia.    *   Likely  dementia.  Plan is discharge to SNF/rehab and ultimately return home.     PLAN   *   Has GI ROV set for 7/23 at 11 AM  *   Budesonide 9 mg daily.  Will start this now, afraid solumedrol could cause additional psych/mental health issues.  Allow soft diet.    *  From GI view: ok to go home.      Azucena Freed  12/05/2017, 9:35 AM Phone Achille Attending   I have taken an interval history, reviewed the chart and examined the patient. I agree with the Advanced Practitioner's note, impression and recommendations.   Will check TSH, ferritin and B12 in AM  Thanks for [facility] care  Does have E coli UTI  I agree w/ SNF stay and rehab  Gatha Mayer, MD, [facility] 12/05/2017 6:36 PM [phone]

## 2017-12-05 NOTE — Progress Notes (Signed)
Patient's family has selected [facility]. They will begin insurance authorization for possible Monday admit.   Percell Locus Chelsi Warr LCSW [phone]

## 2017-12-05 NOTE — Progress Notes (Signed)
PROGRESS NOTE  [name] SWF:[id number] DOB: 22-Aug-1932 DOA: 12/03/2017 PCP: Tonia Ghent, MD  HPI/Recap of past 60 hours: 84 year old female with past medical history of Crohn's disease and diastolic heart failure presented to the emergency room on the evening of 6/26 with 1 day history of nausea and vomiting.  No abdominal pain or diarrhea.  In the emergency room, patient found to have elevated blood pressures with a systolic ranging 220U-542H, tract infection and inflammation of the short segment of the small bowel of the lower abdomen with downstream stricture.  Patient started on gentle IV fluids, IV Rocephin.  Suspected to have heart failure given persistently elevated blood pressures and echocardiogram in 0623 noting diastolic dysfunction.  BNP mildly elevated and repeat echo done on 6/27 noted grade 1 diastolic dysfunction and good ejection fraction, actual improvement from previous echocardiogram.  Seen by GI and is suspected to have Crohn's ileitis.  Started on steroids.  Seen by PT who are recommending skilled nursing.  Patient resting comfortably this morning.  Only complaint is that she wants solid food.  Assessment/Plan: Principal Problem:    Weakness: Needs skilled nursing.  Currently looking for beds.   Acute cystitis without hematuria/Urinary tract infection: IV Rocephin.  Urine cultures pending  Moderate protein calorie malnutrition: Patient meets criteria as evidenced by energy intake less than 75% for greater than 1 month with moderate muscle depletion and also severe muscle depletion, related to her uncontrolled Crohn's disease.  Seen by nutrition.  Put on Ensure 3 times daily  Essential hypertension: In part from retained fluid.  Improved with diuresis.  Have also added low-dose Cozaar.  If blood pressures still staying elevated, will also add hydralazine.  Reportedly, patient not compliant with home medications.  It is unclear if she is ever been on  antihypertensive medications.   Acute on chronic diastolic heart failure: Echocardiogram done 6/27 notes grade 1 diastolic dysfunction.  Have started IV Lasix.  BNP mildly elevated.  Follow daily weights and strict input/output.  Have added low-dose Cozaar.  Worsening heart rate while sleeping is in the low 60s, so will not give beta-blocker.   Crohn's ileitis now presenting with nausea & vomiting: Appreciate GI help.  Solu-Medrol changed to budesonide.  Started on soft diet.   Code Status: Full code  Family Communication: Spoke with daughter by phone  Disposition Plan: For skilled nursing once bed approved   Consultants:  Gastroenterology  Procedures:  Echocardiogram 7/62: Grade 1 diastolic dysfunction.  Antimicrobials:  IV Rocephin 6/27-present  DVT prophylaxis: Lovenox   Objective: Vitals:   12/04/17 2100 12/05/17 0655 12/05/17 0809 12/05/17 0811  BP: (!) 146/74 (!) 169/95 (!) 181/81   Pulse: 68 72    Resp: 16 16 18    Temp: 98.2 F (36.8 C) 98 F (36.7 C) 98.1 F (36.7 C)   TempSrc: Oral Oral Oral   SpO2: 95% 97% 99%   Weight:    51.2 kg (112 lb 14 oz)  Height:        Intake/Output Summary (Last 24 hours) at 12/05/2017 1102 Last data filed at 12/05/2017 0527 Gross per 24 hour  Intake 1068 ml  Output 100 ml  Net 968 ml   Filed Weights   12/04/17 0523 12/05/17 0811  Weight: 49.9 kg (110 lb 0.2 oz) 51.2 kg (112 lb 14 oz)   Body mass index is 20.98 kg/m.  Exam:   General: Oriented x2, no acute distress  HEENT, normocephalic and atraumatic, mucous members moist  Neck:  Supple, no JVD  Cardiovascular: Regular rate and rhythm, 3 out of 6 holosystolic murmur  Respiratory: Clear to auscultation bilaterally  Abdomen: Soft, mild tenderness in the right side, hypoactive bowel sounds  Musculoskeletal: No clubbing or cyanosis or edema  Skin: No skin breaks, tears or lesions  Psychiatry: Appropriate, no evidence of psychoses  Neuro: No focal  deficits   Data Reviewed: CBC: Recent Labs  Lab 12/03/17 2022 12/04/17 0643  WBC 8.6 8.6  NEUTROABS  --  7.2  HGB 16.9* 16.0*  HCT 50.9* 47.0*  MCV 93.2 92.7  PLT 281 607   Basic Metabolic Panel: Recent Labs  Lab 12/03/17 2022 12/04/17 0643 12/05/17 0652  NA 143 142 140  K 3.8 3.8 5.2*  CL 105 103 107  CO2 28 30 27   GLUCOSE 131* 140* 117*  BUN 24* 19 16  CREATININE 0.89 0.97 0.95  CALCIUM 9.8 9.2 8.7*   GFR: Estimated Creatinine Clearance: 35.3 mL/min (by C-G formula based on SCr of 0.95 mg/dL). Liver Function Tests: Recent Labs  Lab 12/03/17 2022 12/04/17 0643  AST 25 22  ALT 20 18  ALKPHOS 69 90  BILITOT 1.2 0.8  PROT 7.5 6.7  ALBUMIN 3.9 3.6   Recent Labs  Lab 12/03/17 2022  LIPASE 43   No results for input(s): AMMONIA in the last 168 hours. Coagulation Profile: No results for input(s): INR, PROTIME in the last 168 hours. Cardiac Enzymes: Recent Labs  Lab 12/03/17 2022  TROPONINI <0.03   BNP (last 3 results) No results for input(s): PROBNP in the last 8760 hours. HbA1C: No results for input(s): HGBA1C in the last 72 hours. CBG: Recent Labs  Lab 12/04/17 0445  GLUCAP 115*   Lipid Profile: No results for input(s): CHOL, HDL, LDLCALC, TRIG, CHOLHDL, LDLDIRECT in the last 72 hours. Thyroid Function Tests: No results for input(s): TSH, T4TOTAL, FREET4, T3FREE, THYROIDAB in the last 72 hours. Anemia Panel: No results for input(s): VITAMINB12, FOLATE, FERRITIN, TIBC, IRON, RETICCTPCT in the last 72 hours. Urine analysis:    Component Value Date/Time   COLORURINE YELLOW 12/03/2017 2034   APPEARANCEUR CLOUDY (A) 12/03/2017 2034   LABSPEC 1.013 12/03/2017 2034   PHURINE 8.0 12/03/2017 2034   GLUCOSEU NEGATIVE 12/03/2017 2034   HGBUR NEGATIVE 12/03/2017 2034   BILIRUBINUR NEGATIVE 12/03/2017 2034   BILIRUBINUR + 07/14/2015 [place] 12/03/2017 2034   PROTEINUR 100 (A) 12/03/2017 2034   UROBILINOGEN negative 07/14/2015  1655   UROBILINOGEN 0.2 05/16/2013 1434   NITRITE NEGATIVE 12/03/2017 2034   LEUKOCYTESUR LARGE (A) 12/03/2017 2034   Sepsis Labs: @LABRCNTIP (procalcitonin:4,lacticidven:4)  )No results found for this or any previous visit (from the past 240 hour(s)).    Studies: No results found.  Scheduled Meds: . budesonide  9 mg Oral Daily  . enoxaparin (LOVENOX) injection  40 mg Subcutaneous Q24H  . feeding supplement (ENSURE ENLIVE)  237 mL Oral TID BM  . furosemide  20 mg Intravenous Q12H  . losartan  25 mg Oral Daily    Continuous Infusions: . cefTRIAXone (ROCEPHIN)  IV 1 g (12/05/17 0321)     LOS: 0 days     Annita Brod, MD [facility]  To reach me or the doctor on call, go to: [email or web address] Password [facility]  12/05/2017, 11:02 AM

## 2017-12-06 DIAGNOSIS — N390 Urinary tract infection, site not specified: Secondary | ICD-10-CM

## 2017-12-06 LAB — BASIC METABOLIC PANEL
ANION GAP: 13 (ref 5–15)
BUN: 29 mg/dL — ABNORMAL HIGH (ref 8–23)
CHLORIDE: 99 mmol/L (ref 98–111)
CO2: 30 mmol/L (ref 22–32)
Calcium: 9.4 mg/dL (ref 8.9–10.3)
Creatinine, Ser: 0.97 mg/dL (ref 0.44–1.00)
GFR calc non Af Amer: 53 mL/min — ABNORMAL LOW (ref >60)
Glucose, Bld: 98 mg/dL (ref 70–99)
POTASSIUM: 3.1 mmol/L — AB (ref 3.5–5.1)
SODIUM: 142 mmol/L (ref 135–145)

## 2017-12-06 LAB — VITAMIN B12: VITAMIN B 12: 613 pg/mL (ref 180–914)

## 2017-12-06 LAB — URINE CULTURE: Culture: >=100000 — AB

## 2017-12-06 LAB — FERRITIN: Ferritin: 428 ng/mL — ABNORMAL HIGH (ref 11–307)

## 2017-12-06 LAB — TSH: TSH: 1.756 u[IU]/mL (ref 0.350–4.500)

## 2017-12-06 MED ORDER — AMOXICILLIN 500 MG PO CAPS
500.0000 mg | ORAL_CAPSULE | Freq: Two times a day (BID) | ORAL | Status: AC
  Administered 2017-12-07 – 2017-12-09 (×5): 500 mg via ORAL
  Filled 2017-12-06 (×5): qty 1

## 2017-12-06 MED ORDER — HYDRALAZINE HCL 25 MG PO TABS
25.0000 mg | ORAL_TABLET | Freq: Three times a day (TID) | ORAL | Status: DC
  Administered 2017-12-06 – 2017-12-11 (×17): 25 mg via ORAL
  Filled 2017-12-06 (×17): qty 1

## 2017-12-06 NOTE — Progress Notes (Signed)
Pt's son came to visit. Told son that he is not allowed to visit per security. Pt's son got loud with nurse when she told him this. Son stated no one had told him this and he wanted to talk to security. Security called and came to bedside. Pt's son left with security. Will continue to monitor. Ranelle Oyster, RN

## 2017-12-06 NOTE — Progress Notes (Signed)
PROGRESS NOTE  [name] IPJ:[id number] DOB: October 01, 1932 DOA: 12/03/2017 PCP: Tonia Ghent, MD  HPI/Recap of past 16 hours: 84 year old female with past medical history of Crohn's disease and diastolic heart failure presented to the emergency room on the evening of 6/26 with 1 day history of nausea and vomiting.  No abdominal pain or diarrhea.  In the emergency room, patient found to have elevated blood pressures with a systolic ranging [id number], tract infection and inflammation of the short segment of the small bowel of the lower abdomen with downstream stricture.  Patient started on gentle IV fluids, IV Rocephin.  Suspected to have heart failure given persistently elevated blood pressures and echocardiogram in 8288 noting diastolic dysfunction.  BNP mildly elevated and repeat echo done on 6/27 noted grade 1 diastolic dysfunction and good ejection fraction, actual improvement from previous echocardiogram.  Seen by GI and is suspected to have Crohn's ileitis.  Started on steroids.  Seen by PT who are recommending skilled nursing.    Patient feeling better.  Tolerating some solid p.o. And appreciates nutrition shakes.  Wants to know when she can go home.  Assessment/Plan: Principal Problem:    Weakness: Needs skilled nursing.    Hopefully Monday.   Acute cystitis without hematuria/Urinary tract infection:   Urine cultures for E coli resistant only to Cipro.  Since she is stable, have changed IV Rocephin over to penicillin  Moderate protein calorie malnutrition: Patient meets criteria as evidenced by energy intake less than 75% for greater than 1 month with moderate muscle depletion and also severe muscle depletion, related to her uncontrolled Crohn's disease.  Seen by nutrition.  Put on Ensure 3 times daily  Essential hypertension: In part from retained fluid.  Improved with diuresis.  Have also added low-dose Cozaar.  With blood pressure still staying elevated, have added hydralazine.  Reportedly, patient not compliant with home medications.  It is unclear if she is ever been on antihypertensive medications.   Acute on chronic diastolic heart failure: Echocardiogram done 6/27 notes grade 1 diastolic dysfunction.  Have started IV Lasix.  BNP mildly elevated.  Follow daily weights and strict input/output.  Have added low-dose Cozaar.  Worsening heart rate while sleeping is in the low 60s, so will not give beta-blocker.   Crohn's ileitis now presenting with nausea & vomiting: Appreciate GI help.  Solu-Medrol changed to budesonide.   Starting to tolerate p.o. Started on soft diet.   Code Status: Full code  Family Communication: Spoke with daughter by phone  Disposition Plan: For skilled nursing once bed approved, possibly Monday   Consultants:  Gastroenterology  Procedures:  Echocardiogram 3/37: Grade 1 diastolic dysfunction.  Antimicrobials:  IV Rocephin 6/27- 6/29.    Ampicillin 6/29- 7/2  DVT prophylaxis: Lovenox   Objective: Vitals:   12/06/17 0507 12/06/17 0700 12/06/17 1044 12/06/17 1354  BP: (!) 154/123  (!) 133/94 (!) 149/88  Pulse:   90 87  Resp:    16  Temp:    99 F (37.2 C)  TempSrc:      SpO2:    96%  Weight:  51.4 kg (113 lb 5.1 oz)    Height:        Intake/Output Summary (Last 24 hours) at 12/06/2017 1852 Last data filed at 12/06/2017 1300 Gross per 24 hour  Intake 900 ml  Output 900 ml  Net 0 ml   Filed Weights   12/04/17 0523 12/05/17 0811 12/06/17 0700  Weight: 49.9 kg (110 lb 0.2 oz)  51.2 kg (112 lb 14 oz) 51.4 kg (113 lb 5.1 oz)   Body mass index is 21.06 kg/m.  Exam:   General: Oriented x2, no acute distress  HEENT, normocephalic and atraumatic, mucous members moist  Neck: Supple, no JVD  Cardiovascular: Regular rate and rhythm, 3 out of 6  Systolic ejection murmur  Respiratory: Clear to auscultation bilaterally  Abdomen: Soft, mild tenderness in the right side, hypoactive bowel sounds  Musculoskeletal: No  clubbing or cyanosis or edema  Skin: No skin breaks, tears or lesions  Psychiatry: Appropriate, no evidence of psychoses  Neuro: No focal deficits   Data Reviewed: CBC: Recent Labs  Lab 12/03/17 2022 12/04/17 0643  WBC 8.6 8.6  NEUTROABS  --  7.2  HGB 16.9* 16.0*  HCT 50.9* 47.0*  MCV 93.2 92.7  PLT 281 701   Basic Metabolic Panel: Recent Labs  Lab 12/03/17 2022 12/04/17 0643 12/05/17 0652 12/06/17 0924  NA 143 142 140 142  K 3.8 3.8 5.2* 3.1*  CL 105 103 107 99  CO2 28 30 27 30   GLUCOSE 131* 140* 117* 98  BUN 24* 19 16 29*  CREATININE 0.89 0.97 0.95 0.97  CALCIUM 9.8 9.2 8.7* 9.4   GFR: Estimated Creatinine Clearance: 34.6 mL/min (by C-G formula based on SCr of 0.97 mg/dL). Liver Function Tests: Recent Labs  Lab 12/03/17 2022 12/04/17 0643  AST 25 22  ALT 20 18  ALKPHOS 69 90  BILITOT 1.2 0.8  PROT 7.5 6.7  ALBUMIN 3.9 3.6   Recent Labs  Lab 12/03/17 2022  LIPASE 43   No results for input(s): AMMONIA in the last 168 hours. Coagulation Profile: No results for input(s): INR, PROTIME in the last 168 hours. Cardiac Enzymes: Recent Labs  Lab 12/03/17 2022  TROPONINI <0.03   BNP (last 3 results) No results for input(s): PROBNP in the last 8760 hours. HbA1C: No results for input(s): HGBA1C in the last 72 hours. CBG: Recent Labs  Lab 12/04/17 0445  GLUCAP 115*   Lipid Profile: No results for input(s): CHOL, HDL, LDLCALC, TRIG, CHOLHDL, LDLDIRECT in the last 72 hours. Thyroid Function Tests: Recent Labs    12/06/17 0514  TSH 1.756   Anemia Panel: Recent Labs    12/06/17 0514  VITAMINB12 613  FERRITIN 428*   Urine analysis:    Component Value Date/Time   COLORURINE YELLOW 12/03/2017 2034   APPEARANCEUR CLOUDY (A) 12/03/2017 2034   LABSPEC 1.013 12/03/2017 2034   PHURINE 8.0 12/03/2017 2034   GLUCOSEU NEGATIVE 12/03/2017 2034   HGBUR NEGATIVE 12/03/2017 2034   BILIRUBINUR NEGATIVE 12/03/2017 2034   BILIRUBINUR + 07/14/2015  [place] 12/03/2017 2034   PROTEINUR 100 (A) 12/03/2017 2034   UROBILINOGEN negative 07/14/2015 1655   UROBILINOGEN 0.2 05/16/2013 1434   NITRITE NEGATIVE 12/03/2017 2034   LEUKOCYTESUR LARGE (A) 12/03/2017 2034   Sepsis Labs: @LABRCNTIP (procalcitonin:4,lacticidven:4)  ) Recent Results (from the past 240 hour(s))  Urine culture     Status: Abnormal   Collection Time: 12/03/17  8:34 PM  Result Value Ref Range Status   Specimen Description URINE, CLEAN CATCH  Final   Special Requests   Final    NONE Performed at [facility], [place] [address] [place], [place] [address]    Culture >=100,000 COLONIES/mL ESCHERICHIA COLI (A)  Final   Report Status 12/06/2017 FINAL  Final   Organism ID, Bacteria ESCHERICHIA COLI (A)  Final      Susceptibility   Escherichia coli -  MIC*    AMPICILLIN <=2 SENSITIVE Sensitive     CEFAZOLIN <=4 SENSITIVE Sensitive     CEFTRIAXONE <=1 SENSITIVE Sensitive     CIPROFLOXACIN >=4 RESISTANT Resistant     GENTAMICIN <=1 SENSITIVE Sensitive     IMIPENEM <=0.25 SENSITIVE Sensitive     NITROFURANTOIN <=16 SENSITIVE Sensitive     TRIMETH/SULFA <=20 SENSITIVE Sensitive     AMPICILLIN/SULBACTAM <=2 SENSITIVE Sensitive     PIP/TAZO <=4 SENSITIVE Sensitive     Extended ESBL NEGATIVE Sensitive     * >=100,000 COLONIES/mL ESCHERICHIA COLI      Studies: No results found.  Scheduled Meds: . [START ON 12/07/2017] amoxicillin  500 mg Oral Q12H  . budesonide  9 mg Oral Daily  . enoxaparin (LOVENOX) injection  40 mg Subcutaneous Q24H  . feeding supplement (ENSURE ENLIVE)  237 mL Oral TID BM  . furosemide  20 mg Intravenous Q12H  . hydrALAZINE  25 mg Oral Q8H  . losartan  25 mg Oral Daily    Continuous Infusions:    LOS: 1 day     Annita Brod, MD [facility]  To reach me or the doctor on call, go to: [email or web address] Password [facility]  12/06/2017, 6:52 PM

## 2017-12-07 DIAGNOSIS — K50019 Crohn's disease of small intestine with unspecified complications: Secondary | ICD-10-CM

## 2017-12-07 LAB — BASIC METABOLIC PANEL WITH GFR
Anion gap: 10 (ref 5–15)
BUN: 47 mg/dL — ABNORMAL HIGH (ref 8–23)
CO2: 27 mmol/L (ref 22–32)
Calcium: 9.2 mg/dL (ref 8.9–10.3)
Chloride: 103 mmol/L (ref 98–111)
Creatinine, Ser: 1.07 mg/dL — ABNORMAL HIGH (ref 0.44–1.00)
GFR calc Af Amer: 54 mL/min — ABNORMAL LOW
GFR calc non Af Amer: 47 mL/min — ABNORMAL LOW
Glucose, Bld: 106 mg/dL — ABNORMAL HIGH (ref 70–99)
Potassium: 3.6 mmol/L (ref 3.5–5.1)
Sodium: 140 mmol/L (ref 135–145)

## 2017-12-07 NOTE — Progress Notes (Signed)
PROGRESS NOTE  [name] WJX:[id number] DOB: [birth year] DOA: 12/03/2017 PCP: Tonia Ghent, MD  HPI/Recap of past 69 hours: 84 year old female with past medical history of Crohn's disease and diastolic heart failure presented to the emergency room on the evening of 6/26 with 1 day history of nausea and vomiting.  No abdominal pain or diarrhea.  In the emergency room, patient found to have elevated blood pressures with a systolic ranging [id number]-[id number], tract infection and inflammation of the short segment of the small bowel of the lower abdomen with downstream stricture.  Patient started on gentle IV fluids, IV Rocephin.  Suspected to have heart failure given persistently elevated blood pressures and echocardiogram in 8469 noting diastolic dysfunction.  BNP mildly elevated and repeat echo done on 6/27 noted grade 1 diastolic dysfunction and good ejection fraction, actual improvement from previous echocardiogram.  Seen by GI and is suspected to have Crohn's ileitis.  Started on steroids.  Seen by PT who are recommending skilled nursing.  Patient doing okay.  No complaints.  Tolerating soft diet  Assessment/Plan: Principal Problem:    Weakness: Needs skilled nursing.    Hopefully Monday.   Acute cystitis without hematuria/Urinary tract infection:   Urine cultures for E coli resistant only to Cipro.  Since she is stable, have changed IV Rocephin over to penicillin  Moderate protein calorie malnutrition: Patient meets criteria as evidenced by energy intake less than 75% for greater than 1 month with moderate muscle depletion and also severe muscle depletion, related to her uncontrolled Crohn's disease.  Seen by nutrition.  Put on Ensure 3 times daily  Essential hypertension: In part from retained fluid.  Improved with diuresis.  Have also added low-dose Cozaar.  With blood pressure still staying elevated, have added hydralazine. Reportedly, patient not compliant with home medications.  It is  unclear if she is ever been on antihypertensive medications.   Acute on chronic diastolic heart failure: Echocardiogram done 6/27 notes grade 1 diastolic dysfunction.  Have started IV Lasix.  BNP mildly elevated.  She diuresed 1 to 2 L and since then has leveled off.  Creatinine ever so slightly starting to trend upward, so have discontinued Lasix.  Follow daily weights and strict input/output.  Have added low-dose Cozaar.  Worsening heart rate while sleeping is in the low 60s, so will not give beta-blocker.  Crohn's ileitis now presenting with nausea & vomiting: Appreciate GI help.  Solu-Medrol changed to budesonide.   Now tolerating soft diet without any further nausea and vomiting.  Code Status: Full code  Family Communication: Brother and daughter at the bedside  Disposition Plan: For skilled nursing once bed approved, possibly Monday   Consultants:  Gastroenterology  Procedures:  Echocardiogram 6/29: Grade 1 diastolic dysfunction.  Antimicrobials:  IV Rocephin 6/27- 6/29.    Ampicillin 6/29- 7/2  DVT prophylaxis: Lovenox   Objective: Vitals:   12/06/17 2152 12/07/17 0546 12/07/17 0608 12/07/17 1410  BP: 129/89  127/86 (!) 158/91  Pulse:   97 99  Resp: 18  18 16   Temp: 97.9 F (36.6 C)  98.4 F (36.9 C) 98.1 F (36.7 C)  TempSrc: Oral  Oral Oral  SpO2:   93% 95%  Weight:  50.5 kg (111 lb 5.3 oz)    Height:        Intake/Output Summary (Last 24 hours) at 12/07/2017 1555 Last data filed at 12/07/2017 1300 Gross per 24 hour  Intake 725 ml  Output 800 ml  Net -75 ml  Filed Weights   12/05/17 0811 12/06/17 0700 12/07/17 0546  Weight: 51.2 kg (112 lb 14 oz) 51.4 kg (113 lb 5.1 oz) 50.5 kg (111 lb 5.3 oz)   Body mass index is 20.7 kg/m.  Exam:   General: Oriented x2, no acute distress  HEENT, normocephalic and atraumatic, mucous members moist  Neck: Supple, no JVD  Cardiovascular: Regular rate and rhythm, 3 out of 6  Systolic ejection  murmur  Respiratory: Clear to auscultation bilaterally  Abdomen: Soft, nontender, nondistended, positive bowel sounds  Musculoskeletal: No clubbing or cyanosis or edema  Skin: No skin breaks, tears or lesions  Psychiatry: Appropriate, no evidence of psychoses  Neuro: No focal deficits   Data Reviewed: CBC: Recent Labs  Lab 12/03/17 2022 12/04/17 0643  WBC 8.6 8.6  NEUTROABS  --  7.2  HGB 16.9* 16.0*  HCT 50.9* 47.0*  MCV 93.2 92.7  PLT 281 888   Basic Metabolic Panel: Recent Labs  Lab 12/03/17 2022 12/04/17 0643 12/05/17 0652 12/06/17 0924 12/07/17 0458  NA 143 142 140 142 140  K 3.8 3.8 5.2* 3.1* 3.6  CL 105 103 107 99 103  CO2 28 30 27 30 27   GLUCOSE 131* 140* 117* 98 106*  BUN 24* 19 16 29* 47*  CREATININE 0.89 0.97 0.95 0.97 1.07*  CALCIUM 9.8 9.2 8.7* 9.4 9.2   GFR: Estimated Creatinine Clearance: 31.4 mL/min (A) (by C-G formula based on SCr of 1.07 mg/dL (H)). Liver Function Tests: Recent Labs  Lab 12/03/17 2022 12/04/17 0643  AST 25 22  ALT 20 18  ALKPHOS 69 90  BILITOT 1.2 0.8  PROT 7.5 6.7  ALBUMIN 3.9 3.6   Recent Labs  Lab 12/03/17 2022  LIPASE 43   No results for input(s): AMMONIA in the last 168 hours. Coagulation Profile: No results for input(s): INR, PROTIME in the last 168 hours. Cardiac Enzymes: Recent Labs  Lab 12/03/17 2022  TROPONINI <0.03   BNP (last 3 results) No results for input(s): PROBNP in the last 8760 hours. HbA1C: No results for input(s): HGBA1C in the last 72 hours. CBG: Recent Labs  Lab 12/04/17 0445  GLUCAP 115*   Lipid Profile: No results for input(s): CHOL, HDL, LDLCALC, TRIG, CHOLHDL, LDLDIRECT in the last 72 hours. Thyroid Function Tests: Recent Labs    12/06/17 0514  TSH 1.756   Anemia Panel: Recent Labs    12/06/17 0514  VITAMINB12 613  FERRITIN 428*   Urine analysis:    Component Value Date/Time   COLORURINE YELLOW 12/03/2017 2034   APPEARANCEUR CLOUDY (A) 12/03/2017 2034    LABSPEC 1.013 12/03/2017 2034   PHURINE 8.0 12/03/2017 2034   GLUCOSEU NEGATIVE 12/03/2017 2034   HGBUR NEGATIVE 12/03/2017 2034   BILIRUBINUR NEGATIVE 12/03/2017 2034   BILIRUBINUR + 07/14/2015 [place] 12/03/2017 2034   PROTEINUR 100 (A) 12/03/2017 2034   UROBILINOGEN negative 07/14/2015 1655   UROBILINOGEN 0.2 05/16/2013 1434   NITRITE NEGATIVE 12/03/2017 2034   LEUKOCYTESUR LARGE (A) 12/03/2017 2034   Sepsis Labs: @LABRCNTIP (procalcitonin:4,lacticidven:4)  ) Recent Results (from the past 240 hour(s))  Urine culture     Status: Abnormal   Collection Time: 12/03/17  8:34 PM  Result Value Ref Range Status   Specimen Description URINE, CLEAN CATCH  Final   Special Requests   Final    NONE Performed at [facility], [place] [address], [place], [place] [address]    Culture >=100,000 COLONIES/mL ESCHERICHIA COLI (A)  Final  Report Status 12/06/2017 FINAL  Final   Organism ID, Bacteria ESCHERICHIA COLI (A)  Final      Susceptibility   Escherichia coli - MIC*    AMPICILLIN <=2 SENSITIVE Sensitive     CEFAZOLIN <=4 SENSITIVE Sensitive     CEFTRIAXONE <=1 SENSITIVE Sensitive     CIPROFLOXACIN >=4 RESISTANT Resistant     GENTAMICIN <=1 SENSITIVE Sensitive     IMIPENEM <=0.25 SENSITIVE Sensitive     NITROFURANTOIN <=16 SENSITIVE Sensitive     TRIMETH/SULFA <=20 SENSITIVE Sensitive     AMPICILLIN/SULBACTAM <=2 SENSITIVE Sensitive     PIP/TAZO <=4 SENSITIVE Sensitive     Extended ESBL NEGATIVE Sensitive     * >=100,000 COLONIES/mL ESCHERICHIA COLI      Studies: No results found.  Scheduled Meds: . amoxicillin  500 mg Oral Q12H  . budesonide  9 mg Oral Daily  . enoxaparin (LOVENOX) injection  40 mg Subcutaneous Q24H  . feeding supplement (ENSURE ENLIVE)  237 mL Oral TID BM  . hydrALAZINE  25 mg Oral Q8H  . losartan  25 mg Oral Daily    Continuous Infusions:    LOS: 2 days     Annita Brod, MD [facility]  To reach me or the  doctor on call, go to: [email or web address] Password [email or web address]  12/07/2017, 3:55 PM

## 2017-12-07 NOTE — Progress Notes (Signed)
   Patient going to SNF this week  She should stay on budesonide to treat Crohn's  Has appt in my office w/ Amy Esterwood PA-C for 7/23.  Call if ? - signing off  Gatha Mayer, MD, [facility] 12/07/2017 2:38 PM [phone]

## 2017-12-08 LAB — BASIC METABOLIC PANEL
Anion gap: 9 (ref 5–15)
BUN: 46 mg/dL — AB (ref 8–23)
CHLORIDE: 100 mmol/L (ref 98–111)
CO2: 30 mmol/L (ref 22–32)
CREATININE: 0.91 mg/dL (ref 0.44–1.00)
Calcium: 9 mg/dL (ref 8.9–10.3)
GFR calc Af Amer: >60 mL/min (ref >60)
GFR calc non Af Amer: 57 mL/min — ABNORMAL LOW (ref >60)
GLUCOSE: 100 mg/dL — AB (ref 70–99)
POTASSIUM: 3.3 mmol/L — AB (ref 3.5–5.1)
SODIUM: 139 mmol/L (ref 135–145)

## 2017-12-08 MED ORDER — BUDESONIDE 3 MG PO CPEP: 9.0000 mg | ORAL_CAPSULE | Freq: Every day | ORAL | 0 refills | Status: DC

## 2017-12-08 MED ORDER — LOSARTAN POTASSIUM 25 MG PO TABS: 25.0000 mg | ORAL_TABLET | Freq: Every day | ORAL | 1 refills | Status: DC

## 2017-12-08 MED ORDER — POTASSIUM CHLORIDE CRYS ER 20 MEQ PO TBCR
40.0000 meq | EXTENDED_RELEASE_TABLET | Freq: Once | ORAL | Status: AC
  Administered 2017-12-08: 40 meq via ORAL
  Filled 2017-12-08: qty 2

## 2017-12-08 MED ORDER — ENSURE ENLIVE PO LIQD: 237.0000 mL | Freq: Three times a day (TID) | ORAL | 12 refills | Status: DC

## 2017-12-08 MED ORDER — POLYETHYLENE GLYCOL 3350 17 G PO PACK
17.0000 g | PACK | Freq: Every day | ORAL | Status: DC
  Administered 2017-12-08 – 2017-12-11 (×3): 17 g via ORAL
  Filled 2017-12-08 (×4): qty 1

## 2017-12-08 MED ORDER — HYDRALAZINE HCL 25 MG PO TABS: 25.0000 mg | ORAL_TABLET | Freq: Three times a day (TID) | ORAL | 1 refills | Status: DC

## 2017-12-08 MED ORDER — ACETAMINOPHEN 325 MG PO TABS: 650.0000 mg | ORAL_TABLET | Freq: Four times a day (QID) | ORAL | Status: DC | PRN

## 2017-12-08 NOTE — Progress Notes (Signed)
Son updated at bedside. He requested a notary to sign an insurance document. CSW explained that the hospital is only able to notarize health care power of attorney paperwork.  Caitlyn Locus Wilson Dusenbery LCSW [phone]

## 2017-12-08 NOTE — ED Notes (Signed)
Pt son at ER lobby desk c.o. Nursing staff not allowing him to see the patient. Pt wanting to speak to staff member from [facility] and upset that HR has not called him back to discuss his complaint on inpatient nursing staff. [place] [name] would like a call back from HR to discuss inpatient nursing "kicking him out of the pt room"

## 2017-12-08 NOTE — Progress Notes (Signed)
CSW awaiting authorization from [facility].   Percell Locus Irma Delancey LCSW [phone]

## 2017-12-08 NOTE — Progress Notes (Signed)
PROGRESS NOTE  [name] PJA:[id number] DOB: Jun 14, 1932 DOA: 12/03/2017 PCP: Tonia Ghent, MD  HPI/Recap of past 19 hours: 84 year old female with past medical history of Crohn's disease and diastolic heart failure presented to the emergency room on the evening of 6/26 with 1 day history of nausea and vomiting.  No abdominal pain or diarrhea.  In the emergency room, patient found to have elevated blood pressures with a systolic ranging 341P-379K, tract infection and inflammation of the short segment of the small bowel of the lower abdomen with downstream stricture.  Patient started on gentle IV fluids, IV Rocephin.  Suspected to have heart failure given persistently elevated blood pressures and echocardiogram in 2409 noting diastolic dysfunction.  BNP mildly elevated and repeat echo done on 6/27 noted grade 1 diastolic dysfunction and good ejection fraction, actual improvement from previous echocardiogram.  Fully diuresed by 6/30.  Seen by GI and is suspected to have Crohn's ileitis.  Started on steroids.  Seen by PT who are recommending skilled nursing.  Patient doing okay.  No complaints.  Tolerating soft diet  Assessment/Plan: Principal Problem:    Weakness: Needs skilled nursing.    Hopefully Monday.   Acute cystitis without hematuria/Urinary tract infection:   Urine cultures for E coli resistant only to Cipro.  Since she is stable, have changed IV Rocephin over to penicillin, which will be finished by tomorrow  Moderate protein calorie malnutrition: Patient meets criteria as evidenced by energy intake less than 75% for greater than 1 month with moderate muscle depletion and also severe muscle depletion, related to her uncontrolled Crohn's disease.  Seen by nutrition.  Put on Ensure 3 times daily  Essential hypertension: In part from retained fluid.  Improved with diuresis.  Have also added low-dose Cozaar.  With blood pressure still staying elevated, have added hydralazine.  Reportedly, patient not compliant with home medications.  It is unclear if she is ever been on antihypertensive medications.   Acute on chronic diastolic heart failure: Echocardiogram done 6/27 notes grade 1 diastolic dysfunction.  Have started IV Lasix.  BNP mildly elevated.  She diuresed 1 to 2 L and since then has leveled off.  Creatinine ever so slightly starting to trend upward, so have discontinued Lasix.  Creatinine since improved.  Follow daily weights and strict input/output.  Have added low-dose Cozaar.  Worsening heart rate while sleeping is in the low 60s, so will not give beta-blocker.  Crohn's ileitis now presenting with nausea & vomiting: Appreciate GI help.  Solu-Medrol changed to budesonide.   Now tolerating soft diet without any further nausea and vomiting.  Code Status: Full code  Family Communication: Left message for daughter  Disposition Plan: For skilled nursing once bed approved, hopefully within the next 24 hours   Consultants:  Gastroenterology  Procedures:  Echocardiogram 7/35: Grade 1 diastolic dysfunction.  Antimicrobials:  IV Rocephin 6/27- 6/29.    Ampicillin 6/29- 7/2  DVT prophylaxis: Lovenox   Objective: Vitals:   12/07/17 2154 12/07/17 2300 12/08/17 0535 12/08/17 1324  BP: (!) 147/97 (!) 142/90 118/80 129/76  Pulse: 83  82 98  Resp: 18  18 14   Temp: (!) 97.5 F (36.4 C)  99.1 F (37.3 C) 97.8 F (36.6 C)  TempSrc: Oral  Oral Oral  SpO2: 93%  96% 97%  Weight:   50.9 kg (112 lb 3.4 oz)   Height:   5' 1"  (1.549 m)     Intake/Output Summary (Last 24 hours) at 12/08/2017 1507 Last data  filed at 12/08/2017 0900 Gross per 24 hour  Intake 657 ml  Output -  Net 657 ml   Filed Weights   12/06/17 0700 12/07/17 0546 12/08/17 0535  Weight: 51.4 kg (113 lb 5.1 oz) 50.5 kg (111 lb 5.3 oz) 50.9 kg (112 lb 3.4 oz)   Body mass index is 21.2 kg/m.  Exam: No change from previous day  General: Oriented x2, no acute distress  HEENT,  normocephalic and atraumatic, mucous members moist  Neck: Supple, no JVD  Cardiovascular: Regular rate and rhythm, 3 out of 6  Systolic ejection murmur  Respiratory: Clear to auscultation bilaterally  Abdomen: Soft, nontender, nondistended, positive bowel sounds  Musculoskeletal: No clubbing or cyanosis or edema  Skin: No skin breaks, tears or lesions  Psychiatry: Appropriate, no evidence of psychoses  Neuro: No focal deficits   Data Reviewed: CBC: Recent Labs  Lab 12/03/17 2022 12/04/17 0643  WBC 8.6 8.6  NEUTROABS  --  7.2  HGB 16.9* 16.0*  HCT 50.9* 47.0*  MCV 93.2 92.7  PLT 281 161   Basic Metabolic Panel: Recent Labs  Lab 12/04/17 0643 12/05/17 0652 12/06/17 0924 12/07/17 0458 12/08/17 0500  NA 142 140 142 140 139  K 3.8 5.2* 3.1* 3.6 3.3*  CL 103 107 99 103 100  CO2 30 27 30 27 30   GLUCOSE 140* 117* 98 106* 100*  BUN 19 16 29* 47* 46*  CREATININE 0.97 0.95 0.97 1.07* 0.91  CALCIUM 9.2 8.7* 9.4 9.2 9.0   GFR: Estimated Creatinine Clearance: 36 mL/min (by C-G formula based on SCr of 0.91 mg/dL). Liver Function Tests: Recent Labs  Lab 12/03/17 2022 12/04/17 0643  AST 25 22  ALT 20 18  ALKPHOS 69 90  BILITOT 1.2 0.8  PROT 7.5 6.7  ALBUMIN 3.9 3.6   Recent Labs  Lab 12/03/17 2022  LIPASE 43   No results for input(s): AMMONIA in the last 168 hours. Coagulation Profile: No results for input(s): INR, PROTIME in the last 168 hours. Cardiac Enzymes: Recent Labs  Lab 12/03/17 2022  TROPONINI <0.03   BNP (last 3 results) No results for input(s): PROBNP in the last 8760 hours. HbA1C: No results for input(s): HGBA1C in the last 72 hours. CBG: Recent Labs  Lab 12/04/17 0445  GLUCAP 115*   Lipid Profile: No results for input(s): CHOL, HDL, LDLCALC, TRIG, CHOLHDL, LDLDIRECT in the last 72 hours. Thyroid Function Tests: Recent Labs    12/06/17 0514  TSH 1.756   Anemia Panel: Recent Labs    12/06/17 0514  VITAMINB12 613  FERRITIN  428*   Urine analysis:    Component Value Date/Time   COLORURINE YELLOW 12/03/2017 2034   APPEARANCEUR CLOUDY (A) 12/03/2017 2034   LABSPEC 1.013 12/03/2017 2034   PHURINE 8.0 12/03/2017 2034   GLUCOSEU NEGATIVE 12/03/2017 2034   HGBUR NEGATIVE 12/03/2017 2034   BILIRUBINUR NEGATIVE 12/03/2017 2034   BILIRUBINUR + 07/14/2015 [place] 12/03/2017 2034   PROTEINUR 100 (A) 12/03/2017 2034   UROBILINOGEN negative 07/14/2015 1655   UROBILINOGEN 0.2 05/16/2013 1434   NITRITE NEGATIVE 12/03/2017 2034   LEUKOCYTESUR LARGE (A) 12/03/2017 2034   Sepsis Labs: @LABRCNTIP (procalcitonin:4,lacticidven:4)  ) Recent Results (from the past 240 hour(s))  Urine culture     Status: Abnormal   Collection Time: 12/03/17  8:34 PM  Result Value Ref Range Status   Specimen Description URINE, CLEAN CATCH  Final   Special Requests   Final    NONE Performed at [facility]  [facility], [place] [address], [place], [place] [address]    Culture >=100,000 COLONIES/mL ESCHERICHIA COLI (A)  Final   Report Status 12/06/2017 FINAL  Final   Organism ID, Bacteria ESCHERICHIA COLI (A)  Final      Susceptibility   Escherichia coli - MIC*    AMPICILLIN <=2 SENSITIVE Sensitive     CEFAZOLIN <=4 SENSITIVE Sensitive     CEFTRIAXONE <=1 SENSITIVE Sensitive     CIPROFLOXACIN >=4 RESISTANT Resistant     GENTAMICIN <=1 SENSITIVE Sensitive     IMIPENEM <=0.25 SENSITIVE Sensitive     NITROFURANTOIN <=16 SENSITIVE Sensitive     TRIMETH/SULFA <=20 SENSITIVE Sensitive     AMPICILLIN/SULBACTAM <=2 SENSITIVE Sensitive     PIP/TAZO <=4 SENSITIVE Sensitive     Extended ESBL NEGATIVE Sensitive     * >=100,000 COLONIES/mL ESCHERICHIA COLI      Studies: No results found.  Scheduled Meds: . amoxicillin  500 mg Oral Q12H  . budesonide  9 mg Oral Daily  . enoxaparin (LOVENOX) injection  40 mg Subcutaneous Q24H  . feeding supplement (ENSURE ENLIVE)  237 mL Oral TID BM  . hydrALAZINE  25 mg Oral Q8H  . losartan   25 mg Oral Daily  . polyethylene glycol  17 g Oral Daily    Continuous Infusions:    LOS: 3 days     Annita Brod, MD [facility]  To reach me or the doctor on call, go to: [email or web address] Password [email or web address]  12/08/2017, 3:07 PM

## 2017-12-08 NOTE — Progress Notes (Signed)
Physical Therapy Treatment Patient Details Name: [name] MRN: [id number] DOB: April 28, 1933 Today's Date: 12/08/2017    History of Present Illness Patient is a 84 y/o female admitted wtih nausea and vomitting for ~2 days. In the ER patient had CT abdomen and pelvis done which shows inflammation of short segment of small bowel in the lower abdomen with downstream stricture with no inflammation.  Also has features of chronic Crohn's.  UA is compatible with UTI. Patient with a PMH significant for Crohn's disease.    PT Comments    [name] doing well this morning. Motivated to work with PT this AM. Patient continues to require consistent verbal cueing for safety and sequencing with all activities. Continued Min A for RW management with gait for safety. PT recommendation continue to be appropriate. Patient making progress towards goals.     Follow Up Recommendations  SNF;Supervision/Assistance - 24 hour     Equipment Recommendations  Other (comment)    Recommendations for Other Services       Precautions / Restrictions Precautions Precautions: Fall Restrictions Weight Bearing Restrictions: No    Mobility  Bed Mobility Overal bed mobility: Needs Assistance Bed Mobility: Supine to Sit;Sit to Supine     Supine to sit: Min guard Sit to supine: Min guard   General bed mobility comments: min guard for safety; increased time with consistent verbal cueing to perform activity  Transfers Overall transfer level: Needs assistance Equipment used: Rolling walker (2 wheeled) Transfers: Sit to/from Stand Sit to Stand: Min assist;Min guard         General transfer comment: verbal cueing to place feet on floor and for hand placement for safety. Min guard for immediate standing balance  Ambulation/Gait Ambulation/Gait assistance: Min assist;Min guard Gait Distance (Feet): 120 Feet Assistive device: Rolling walker (2 wheeled) Gait Pattern/deviations: Step-through  pattern;Decreased stride length;Drifts right/left;Trunk flexed Gait velocity: decreased   General Gait Details: Continued Min A for RW management; improved awareness of surroundings but still with unsteady gait pattern requiring Min guard/Min A for overall safety   Stairs             Wheelchair Mobility    Modified Rankin (Stroke Patients Only)       Balance Overall balance assessment: Needs assistance Sitting-balance support: No upper extremity supported;Feet supported Sitting balance-Leahy Scale: Fair     Standing balance support: Bilateral upper extremity supported;During functional activity Standing balance-Leahy Scale: Poor                              Cognition Arousal/Alertness: Awake/alert Behavior During Therapy: WFL for tasks assessed/performed Overall Cognitive Status: Impaired/Different from baseline Area of Impairment: Memory;Following commands;Safety/judgement;Problem solving                     Memory: Decreased short-term memory Following Commands: Follows one step commands with increased time Safety/Judgement: Decreased awareness of safety;Decreased awareness of deficits   Problem Solving: Slow processing;Decreased initiation;Difficulty sequencing;Requires verbal cues;Requires tactile cues        Exercises      General Comments        Pertinent Vitals/Pain Pain Assessment: No/denies pain    Home Living                      Prior Function            PT Goals (current goals can now be found in the care plan section)  Acute Rehab PT Goals Patient Stated Goal: return home PT Goal Formulation: With patient Time For Goal Achievement: 12/18/17 Potential to Achieve Goals: Good Progress towards PT goals: Progressing toward goals    Frequency    Min 3X/week      PT Plan Current plan remains appropriate    Co-evaluation              AM-PAC PT "6 Clicks" Daily Activity  Outcome Measure   Difficulty turning over in bed (including adjusting bedclothes, sheets and blankets)?: A Little Difficulty moving from lying on back to sitting on the side of the bed? : Unable Difficulty sitting down on and standing up from a chair with arms (e.g., wheelchair, bedside commode, etc,.)?: Unable Help needed moving to and from a bed to chair (including a wheelchair)?: A Little Help needed walking in hospital room?: A Little Help needed climbing 3-5 steps with a railing? : A Little 6 Click Score: 14    End of Session Equipment Utilized During Treatment: Gait belt Activity Tolerance: Patient tolerated treatment well Patient left: in bed;with call bell/phone within reach;with bed alarm set Nurse Communication: Mobility status PT Visit Diagnosis: Unsteadiness on feet (R26.81);Other abnormalities of gait and mobility (R26.89);Muscle weakness (generalized) (M62.81)     Time: 3112-1624 PT Time Calculation (min) (ACUTE ONLY): 17 min  Charges:  $Gait Training: 8-22 mins                    G Codes:       [name], PT, DPT 12/08/17 9:36 AM

## 2017-12-08 NOTE — Care Management Note (Signed)
Case Management Note  Patient Details  Name: [name] MRN: [id number] Date of Birth: [birth year]  Subjective/Objective:       Admitted with N/V, hx of crohn's disease .           Charleston Ropes (Daughter) Orlene Erm [id number] [phone]     PCP: Dr. Laverle Patter  Action/Plan: Per PT's evaluation: SNF;Supervision/Assistance - 24 hour. Pt has agreed to SNF/rehab placement. Awaiting insurance authorization. CSW managing disposition to SNF.           Expected Discharge Date:                  Expected Discharge Plan:  Skilled Nursing Facility  In-House Referral:  Clinical Social Work  Discharge planning Services     Post Acute Care Choice:    Choice offered to:     DME Arranged:   N/A DME Agency:   N/A  HH Arranged:    N/A HH Agency:   N/A  Status of Service:  Completed, signed off  If discussed at [place] of Stay Meetings, dates discussed:    Additional Comments:  Sharin Mons, RN 12/08/2017, 10:50 AM

## 2017-12-08 NOTE — Discharge Summary (Signed)
Discharge Summary  [name] CBJ:[id number] DOB: 02-Nov-1932  PCP: Tonia Ghent, MD  Admit date: 12/03/2017 Anticipated discharge date: 12/10/2017  Time spent: 25 minutes  Recommendations for Outpatient Follow-up:  1. New medication: Cozaar 50 mg p.o. daily 2. New medication: Hydralazine 25 mg p.o. 3 times daily 3. New medication: Budesonide 9 mg p.o. daily  Discharge Diagnoses:  Active Hospital Problems   Diagnosis Date Noted  . Non-intractable vomiting with nausea 12/04/2017  . Acute diastolic (congestive) heart failure ([place]) 12/05/2017  . Weakness 12/04/2017  . Acute cystitis without hematuria 12/04/2017  . Elevated blood pressure reading 12/04/2017  . Nausea & vomiting 12/04/2017  . Chronic diastolic CHF (congestive heart failure) ([place]) 12/04/2017  . Malnutrition of moderate degree 12/04/2017  . CROHN'S DISEASE, SMALL INTESTINE 01/04/2009    Resolved Hospital Problems  No resolved problems to display.    Discharge Condition: Improved, being discharged to skilled nursing  Diet recommendation: Heart healthy, soft  Vitals:   12/08/17 0535 12/08/17 1324  BP: 118/80 129/76  Pulse: 82 98  Resp: 18 14  Temp: 99.1 F (37.3 C) 97.8 F (36.6 C)  SpO2: 96% 97%    History of present illness:  84 year old female with past medical history of Crohn's disease and diastolic heart failure presented to the emergency room on the evening of 6/26 with 1 day history of nausea and vomiting.  No abdominal pain or diarrhea.  In the emergency room, patient found to have elevated blood pressures with a systolic ranging 160V-371G, tract infection and inflammation of the short segment of the small bowel of the lower abdomen with downstream stricture.  Patient started on gentle IV fluids, IV Rocephin.  Hospital Course:    Weakness: Needs skilled nursing.    Hopefully Monday.   Acute cystitis without hematuria/Urinary tract infection:   Urine cultures for E coli resistant only to  Cipro.  Since she is stable, have changed IV Rocephin over to ampicillin which patient completed full antibiotic course on 7/2.  Moderate protein calorie malnutrition: Patient meets criteria as evidenced by energy intake less than 75% for greater than 1 month with moderate muscle depletion and also severe muscle depletion, related to her uncontrolled Crohn's disease.  Seen by nutrition.  Put on Ensure 3 times daily  Essential hypertension: In part from retained fluid.  Improved with diuresis.  Have also added low-dose Cozaar.  With blood pressure still staying elevated, have added hydralazine. Reportedly, patient not compliant with home medications.  It is unclear if she is ever been on antihypertensive medications.   Acute on chronic diastolic heart failure: Suspected to have heart failure given persistently elevated blood pressures and echocardiogram in 6269 noting diastolic dysfunction.  BNP mildly elevated and repeat echo done on 6/27 noted grade 1 diastolic dysfunction and good ejection fraction, actual improvement from previous echocardiogram.  Patient responded well to IV Lasix and diuresed several liters and then leveled off.  Lasix discontinued.  Daily weights have been stable.  Added low-dose Cozaar.  Her heart rate while sleeping is in the low 60s, so no beta-blocker added.  Crohn's ileitis now presenting with nausea & vomiting: GI consulted, suspected to have Crohn's ileitis.   Initially started on IV Solu-Medrol and then changed to budesonide.   Now tolerating soft diet without any further nausea and vomiting.  GI recommends continuing patient on budesonide   Consultants:  Gastroenterology  Procedures:  Echocardiogram 4/85: Grade 1 diastolic dysfunction.   Discharge Exam: BP 129/76 (BP Location: Right  Arm)   Pulse 98   Temp 97.8 F (36.6 C) (Oral)   Resp 14   Ht 5' 1"  (1.549 m)   Wt 50.9 kg (112 lb 3.4 oz)   SpO2 97%   BMI 21.20 kg/m   General: Alert and oriented x2,  no acute distress Cardiovascular: Regular rate and rhythm, S1-S2 Respiratory: Clear to auscultation bilaterally  Discharge Instructions You were cared for by a hospitalist during your hospital stay. If you have any questions about your discharge medications or the care you received while you were in the hospital after you are discharged, you can call the unit and asked to speak with the hospitalist on call if the hospitalist that took care of you is not available. Once you are discharged, your primary care physician will handle any further medical issues. Please note that NO REFILLS for any discharge medications will be authorized once you are discharged, as it is imperative that you return to your primary care physician (or establish a relationship with a primary care physician if you do not have one) for your aftercare needs so that they can reassess your need for medications and monitor your lab values.   Allergies as of 12/09/2017   No Known Allergies     Medication List    STOP taking these medications   cholecalciferol 1000 units tablet Commonly known as:  VITAMIN D     TAKE these medications   acetaminophen 325 MG tablet Commonly known as:  TYLENOL Take 2 tablets (650 mg total) by mouth every 6 (six) hours as needed for mild pain (or Fever >/= 101).   budesonide 3 MG 24 hr capsule Commonly known as:  ENTOCORT EC Take 3 capsules (9 mg total) by mouth daily.   calcium-vitamin D 500-200 MG-UNIT tablet Commonly known as:  OSCAL WITH D Take 1 tablet by mouth 2 (two) times daily.   feeding supplement (ENSURE ENLIVE) Liqd Take 237 mLs by mouth 3 (three) times daily between meals.   hydrALAZINE 25 MG tablet Commonly known as:  APRESOLINE Take 1 tablet (25 mg total) by mouth every 8 (eight) hours.   losartan 25 MG tablet Commonly known as:  COZAAR Take 1 tablet (25 mg total) by mouth daily.   multivitamin tablet Take 1 tablet by mouth daily.      No Known Allergies   Contact information for follow-up providers    Tonia Ghent, MD. Schedule an appointment as soon as possible for a visit in 2 days.   Specialty:  Family Medicine Contact information: [place] [address] [phone]        Alfredia Ferguson, PA-C Follow up on 12/30/2017.   Specialty:  Gastroenterology Why:  11 AM.  follow up with GI for Crohn's disease.   Contact information: [address] [place] [address] [phone]            Contact information for after-discharge care    Destination    [facility] .   Service:  Skilled Nursing Contact information: [address] [place] [phone]                   The results of significant diagnostics from this hospitalization (including imaging, microbiology, ancillary and laboratory) are listed below for reference.    Significant Diagnostic Studies: Ct Abdomen Pelvis W Contrast  Result Date: 12/04/2017 CLINICAL DATA:  84 y/o F; generalized weakness, nausea, vomiting, diarrhea. History of Crohn's.  EXAM: CT ABDOMEN AND PELVIS WITH CONTRAST TECHNIQUE: Multidetector CT imaging of the abdomen and pelvis was performed using the standard protocol following bolus administration of intravenous contrast. CONTRAST:  156m OMNIPAQUE IOHEXOL 300 MG/ML  SOLN COMPARISON:  11/02/2009 CT abdomen and pelvis. FINDINGS: Lower chest: Bilateral breast prostheses. Bilateral lower lobe platelike atelectasis. Small hiatal hernia. Hepatobiliary: No focal liver abnormality is seen. No gallstones, gallbladder wall thickening, or biliary dilatation. Pancreas: Unremarkable. No pancreatic ductal dilatation or surrounding inflammatory changes. Spleen: Normal in size without focal abnormality. Adrenals/Urinary Tract: Adrenal glands are unremarkable. Kidneys are normal, without renal calculi, focal lesion, or hydronephrosis. Bladder is unremarkable. Stomach/Bowel: There is a short  segment of wall thickening enhancement of distal small bowel in the lower abdomen compatible with acute inflammation (series 3, image 55). There is a downstream segment of stricture without thickening compatible with chronic sequelae of Crohn's. Additionally, there is diffuse fatty wall thickening without stricture of small bowel in the right hemiabdomen compatible with chronic inflammatory change. No obstructive or inflammatory changes of the colon. Sigmoid diverticulosis without findings of acute diverticulitis. Vascular/Lymphatic: Aortic atherosclerosis. No enlarged abdominal or pelvic lymph nodes. Reproductive: Uterus and bilateral adnexa are unremarkable. Other: No abdominal wall hernia or abnormality. No abdominopelvic ascites. Musculoskeletal: Moderate levocurvature of the spine with apex at L2. No acute osseous abnormality identified. IMPRESSION: 1. Acute inflammation of short segment of small bowel in the lower abdomen. Downstream segment of stricture without inflammation compatible with fibrostenotic chronic Crohn's. No fistula identified. 2. Diffuse fatty wall thickening of small bowel in the right hemiabdomen indicating chronic inflammatory change. 3. Sigmoid diverticulosis without findings of acute diverticulitis. 4. Small hiatal hernia. Electronically Signed   By: LKristine GarbeM.D.   On: 12/04/2017 02:18    Microbiology: Recent Results (from the past 240 hour(s))  Urine culture     Status: Abnormal   Collection Time: 12/03/17  8:34 PM  Result Value Ref Range Status   Specimen Description URINE, CLEAN CATCH  Final   Special Requests   Final    NONE Performed [facility] [address] [place] [address]   Culture >=100,000 COLONIES/mL ESCHERICHIA COLI (A)  Final   Report Status 12/06/2017 FINAL  Final   Organism ID, Bacteria ESCHERICHIA COLI (A)  Final      Susceptibility   Escherichia coli - MIC*    AMPICILLIN <=2 SENSITIVE Sensitive     CEFAZOLIN <=4  SENSITIVE Sensitive     CEFTRIAXONE <=1 SENSITIVE Sensitive     CIPROFLOXACIN >=4 RESISTANT Resistant     GENTAMICIN <=1 SENSITIVE Sensitive     IMIPENEM <=0.25 SENSITIVE Sensitive     NITROFURANTOIN <=16 SENSITIVE Sensitive     TRIMETH/SULFA <=20 SENSITIVE Sensitive     AMPICILLIN/SULBACTAM <=2 SENSITIVE Sensitive     PIP/TAZO <=4 SENSITIVE Sensitive     Extended ESBL NEGATIVE Sensitive     * >=100,000 COLONIES/mL ESCHERICHIA COLI     Labs: Basic Metabolic Panel: Recent Labs  Lab 12/04/17 0643 12/05/17 0652 12/06/17 0924 12/07/17 0458 12/08/17 0500  NA 142 140 142 140 139  K 3.8 5.2* 3.1* 3.6 3.3*  CL 103 107 99 103 100  CO2 30 27 30 27 30   GLUCOSE 140* 117* 98 106* 100*  BUN 19 16 29* 47* 46*  CREATININE 0.97 0.95 0.97 1.07* 0.91  CALCIUM 9.2 8.7* 9.4 9.2 9.0   Liver Function Tests: Recent Labs  Lab 12/03/17 2022 12/04/17 0643  AST 25 22  ALT 20 18  ALKPHOS 69 90  BILITOT 1.2 0.8  PROT 7.5 6.7  ALBUMIN 3.9 3.6   Recent Labs  Lab 12/03/17 2022  LIPASE 43   No results for input(s): AMMONIA in the last 168 hours. CBC: Recent Labs  Lab 12/03/17 2022 12/04/17 0643  WBC 8.6 8.6  NEUTROABS  --  7.2  HGB 16.9* 16.0*  HCT 50.9* 47.0*  MCV 93.2 92.7  PLT 281 258   Cardiac Enzymes: Recent Labs  Lab 12/03/17 2022  TROPONINI <0.03   BNP: BNP (last 3 results) Recent Labs    12/04/17 0752  BNP 247.8*    ProBNP (last 3 results) No results for input(s): PROBNP in the last 8760 hours.  CBG: Recent Labs  Lab 12/04/17 0445  GLUCAP 115*       Signed:  Annita Brod, MD [facility] 12/08/2017, 3:02 PM

## 2017-12-09 DIAGNOSIS — K50018 Crohn's disease of small intestine with other complication: Secondary | ICD-10-CM

## 2017-12-09 NOTE — Progress Notes (Signed)
[employer] alerted snf that they would have decision on auth today but no answer from [facility]. No LOG facilities found due to risk of patient being denied. CSW to follow up tomorrow.   Percell Locus Ahmira Boisselle LCSW [phone]

## 2017-12-09 NOTE — Care Management Important Message (Signed)
Important Message  Patient Details  Name: [name] MRN: [id number] Date of Birth: 11-Dec-1932   Medicare Important Message Given:  Yes    Orbie Pyo 12/09/2017, 8:34 AM

## 2017-12-10 DIAGNOSIS — I5032 Chronic diastolic (congestive) heart failure: Secondary | ICD-10-CM

## 2017-12-10 DIAGNOSIS — N3000 Acute cystitis without hematuria: Principal | ICD-10-CM

## 2017-12-10 DIAGNOSIS — I5031 Acute diastolic (congestive) heart failure: Secondary | ICD-10-CM

## 2017-12-10 NOTE — Progress Notes (Addendum)
5:15pm-CSW received denial from [facility]. CSW updated daughter.   5pm-CSW still does not have authorization from [facility] and they are closed Thursday. CSW and [facility] liaison have contacted [employer] and left voicemails multiple times. Per MD, patient has been ready to discharge. CSW alerted patient's daughter that patient will need to pay privately to go to [facility] ($265/day with 5 days paid up front) or discharge home tomorrow with family. Patient is walking 185ft, which [place] may not approve and may be the reason for the delay. Patient's daughter reported understanding and will let her brother know. She warned CSW that her brother would be upset. She will discuss the plan with him and her other sister for discharge tomorrow.    Caitlyn Locus Laquisha Northcraft LCSW [phone]

## 2017-12-10 NOTE — Progress Notes (Signed)
Physical Therapy Treatment Patient Details Name: [name] MRN: [id number] DOB: Feb 06, 1933 Today's Date: 12/10/2017    History of Present Illness Patient is a 84 y/o female admitted wtih nausea and vomitting for ~2 days. In the ER patient had CT abdomen and pelvis done which shows inflammation of short segment of small bowel in the lower abdomen with downstream stricture with no inflammation.  Also has features of chronic Crohn's.  UA is compatible with UTI. Patient with a PMH significant for Crohn's disease.    PT Comments    Ms. Gulden doing well. PT session focusing on safe functional mobility. Motivation required for activity today as patient prefers to rest in bed. Continues to require Min A for RW management with gait as patient has poor awareness of her surroundings, and with poor safety awareness with AD.  Education on safe transfers for reduced fall risk as patient attempts to sit on bed prior to ensuring that it is behind her. PT recommendations remain appropriate.    Follow Up Recommendations  SNF;Supervision/Assistance - 24 hour     Equipment Recommendations  Other (comment)    Recommendations for Other Services       Precautions / Restrictions Precautions Precautions: Fall Restrictions Weight Bearing Restrictions: No    Mobility  Bed Mobility Overal bed mobility: Needs Assistance Bed Mobility: Supine to Sit;Sit to Supine     Supine to sit: Min guard Sit to supine: Min guard   General bed mobility comments: motivation to perform tasks as patient asks if shes allowed to nap  Transfers Overall transfer level: Needs assistance Equipment used: Rolling walker (2 wheeled) Transfers: Sit to/from Stand Sit to Stand: Min guard         General transfer comment: verbal cues for hand placement and sequencing.   Ambulation/Gait Ambulation/Gait assistance: Min assist;Min guard Gait Distance (Feet): 140 Feet Assistive device: Rolling walker (2 wheeled) Gait  Pattern/deviations: Step-through pattern;Decreased stride length;Drifts right/left;Trunk flexed Gait velocity: decreased   General Gait Details: Min A for RW management + verbal cueing for safety and sequencing with device with limited carryover. limited awareness of surroundings with unsteady gait pattern   Stairs             Wheelchair Mobility    Modified Rankin (Stroke Patients Only)       Balance Overall balance assessment: Needs assistance Sitting-balance support: No upper extremity supported;Feet supported Sitting balance-Leahy Scale: Fair     Standing balance support: Bilateral upper extremity supported;During functional activity Standing balance-Leahy Scale: Poor Standing balance comment: reliant on external support for balance                            Cognition Arousal/Alertness: Awake/alert Behavior During Therapy: WFL for tasks assessed/performed Overall Cognitive Status: Impaired/Different from baseline Area of Impairment: Memory;Following commands;Safety/judgement;Problem solving                     Memory: Decreased short-term memory Following Commands: Follows one step commands with increased time Safety/Judgement: Decreased awareness of safety;Decreased awareness of deficits   Problem Solving: Slow processing;Decreased initiation;Difficulty sequencing;Requires verbal cues;Requires tactile cues        Exercises      General Comments        Pertinent Vitals/Pain Pain Assessment: No/denies pain    Home Living                      Prior Function  PT Goals (current goals can now be found in the care plan section) Acute Rehab PT Goals Patient Stated Goal: return home PT Goal Formulation: With patient Time For Goal Achievement: 12/18/17 Potential to Achieve Goals: Good Progress towards PT goals: Progressing toward goals    Frequency    Min 3X/week      PT Plan Current plan remains  appropriate    Co-evaluation              AM-PAC PT "6 Clicks" Daily Activity  Outcome Measure  Difficulty turning over in bed (including adjusting bedclothes, sheets and blankets)?: A Little Difficulty moving from lying on back to sitting on the side of the bed? : Unable Difficulty sitting down on and standing up from a chair with arms (e.g., wheelchair, bedside commode, etc,.)?: Unable Help needed moving to and from a bed to chair (including a wheelchair)?: A Little Help needed walking in hospital room?: A Little Help needed climbing 3-5 steps with a railing? : A Little 6 Click Score: 14    End of Session Equipment Utilized During Treatment: Gait belt Activity Tolerance: Patient tolerated treatment well Patient left: in bed;with call bell/phone within reach;with bed alarm set Nurse Communication: Mobility status PT Visit Diagnosis: Unsteadiness on feet (R26.81);Other abnormalities of gait and mobility (R26.89);Muscle weakness (generalized) (M62.81)     Time: 8144-8185 PT Time Calculation (min) (ACUTE ONLY): 14 min  Charges:  $Gait Training: 8-22 mins                    G Codes:       Lanney Gins, PT, DPT 12/10/17 2:16 PM

## 2017-12-10 NOTE — Consult Note (Signed)
[facility] CM Primary Care Navigator  12/10/2017  [name] 1932-09-28 [id number]   Met with patient at the bedside to identify possible discharge needs.  Patient reports having "nausea and vomiting" thathad to this admission. (Acute cystitis without hematuria/ Urinary tract infection)  Patient endorsesDr.Graham Damita Dunnings with [facility] astheprimary care provider.   [facility] [place] [facility] without [facility] so far.  Patient states that she beenmanaging hermedications at homestraight out of the containers.  Her friend [name]- works for son) Marin Olp providing transportation to [employer]' appointments.  Patient reports that she lives alone and independent with self care at home. Her daughter [name]- RN), son [name]) and friend [name]) are able to provide assistance when needed.  Anticipated discharge plan is skilled nursing facility(SNF) for rehabilitation per therapy recommendation. Awaiting for insurance approval.  Patient voiced understanding to call primary care provider's office when she returns backhome,for a post discharge follow-up visitwithin1- 2 weeksor sooner if needs arise.Patient letter (with PCP's contact number) was provided asareminder.  Explained to patient about [facility] CM services available for health managementand resourcesat home, but patient is unsure what will be decided on regarding her discharge (pending insurance).  Patient was encouraged to discuss with primary care provider on her next visit about health management needs after rehab facility discharge.   Patient verbalizedunderstandingof needto seekreferral from primary care provider to [facility] care management ifdeemed necessary and appropriatefor anyservicesin the nearfuture- when she returnsback home.   [facility] care management information provided for future needs  thatpatient may have.   Primary care provider's office is listed as providing transition of care (TOC) follow-up.    For additional questions please contact:  Edwena Felty A. Lamyiah Crawshaw, BSN, RN-BC [facility] PRIMARY CARE Navigator Cell: [phone]

## 2017-12-10 NOTE — Progress Notes (Signed)
[facility]                                                                              Patient Demographics  [name], is a 84 y.o. female, DOB - 22-Apr-1933, QIH:[id number]  Admit date - 12/03/2017   Admitting Physician Rise Patience, MD  Outpatient Primary MD for the patient is Tonia Ghent, MD  Outpatient specialists:   LOS - 5  days   Medical records reviewed and are as summarized below:    Chief Complaint  Patient presents with  . Weakness  . Emesis       Brief summary   84 year old female with past medical history of Crohn's disease and diastolic heart failure presented to the emergency room on the evening of 6/26 with 1 day history of nausea and vomiting. No abdominal pain or diarrhea. In the emergency room, patient found to have elevated blood pressures with a systolic ranging 875I-433I, tract infection and inflammation of the short segment of the small bowel of the lower abdomen with downstream stricture. Patient was started on IV fluid hydration and antibiotics.   Assessment & Plan    Acute cystitis/UTI without hematuria -Urine culture was positive for E. coli resistant only to ciprofloxacin. -Patient was placed on IV Rocephin, changed over to ampicillin and has completed a full course of antibiotics on 7/2  Moderate protein calorie malnutrition possibly due to Crohn's disease -Patient was seen by nutrition and placed on nutritional supplements 3 times daily  Essential hypertension -BP currently stable, continue low-dose of Cozaar, hydralazine   Acute on chronic diastolic CHF  -BNP mildly elevated, 2D echo on 6/27 showed grade 1 diastolic dysfunction, EF 70 to 75% -Patient was placed on IV Lasix and diuresed, Lasix now discontinued -Continue low-dose Cozaar.  Crohn's ileitis, presented with nausea and vomiting -GI was consulted and initially started on IV Solu-Medrol now changed to budesonide -Patient tolerating soft  diet without any difficulty, outpatient follow-up with GI  Generalized debility -PT recommended skilled nursing facility   Code Status: Full CODE STATUS DVT Prophylaxis:  Lovenox Family Communication: Discussed in detail with the patient, all imaging results, lab results explained to the patient  Disposition Plan: Awaiting skilled nursing facility bed. DC summary done on 7/2 still stands, no changes  Time Spent in minutes  25 minutes  Procedures:    Consultants:   GI Antimicrobials:      Medications  Scheduled Meds: . budesonide  9 mg Oral Daily  . enoxaparin (LOVENOX) injection  40 mg Subcutaneous Q24H  . feeding supplement (ENSURE ENLIVE)  237 mL Oral TID BM  . hydrALAZINE  25 mg Oral Q8H  . losartan  25 mg Oral Daily  . polyethylene glycol  17 g Oral Daily   Continuous Infusions: PRN Meds:.acetaminophen **OR** acetaminophen, hydrALAZINE, ondansetron **OR** ondansetron (ZOFRAN) IV   Antibiotics   Anti-infectives (From admission, onward)   Start     Dose/Rate Route Frequency Ordered Stop   12/07/17 1000  amoxicillin (AMOXIL) capsule 500 mg     500 mg Oral Every 12 hours 12/06/17 1101 12/09/17 1109   12/05/17 0400  cefTRIAXone (ROCEPHIN)  1 g in sodium chloride 0.9 % 100 mL IVPB  Status:  Discontinued     1 g 200 mL/hr over 30 Minutes Intravenous Every 24 hours 12/04/17 0538 12/06/17 1100   12/04/17 0315  cefTRIAXone (ROCEPHIN) 2 g in sodium chloride 0.9 % 100 mL IVPB     2 g 200 mL/hr over 30 Minutes Intravenous  Once 12/04/17 0314 12/04/17 0418        Subjective:   Keryl Gholson was seen and examined today.  No acute complaints. Patient denies dizziness, chest pain, shortness of breath, abdominal pain, N/V/D/C, new weakness, numbess, tingling. No acute events overnight.    Objective:   Vitals:   12/09/17 1344 12/09/17 2148 12/09/17 2159 12/10/17 0445  BP: (!) 149/87 115/84 127/68 123/78  Pulse: 88 91 88 79  Resp: 20 18  18   Temp: 98.6 F (37 C) 97.7  F (36.5 C) (!) 97.5 F (36.4 C) 98.6 F (37 C)  TempSrc: Oral Oral Oral Oral  SpO2: 94% 96% 96% 99%  Weight:    51.7 kg (113 lb 15.7 oz)  Height:        Intake/Output Summary (Last 24 hours) at 12/10/2017 1357 Last data filed at 12/10/2017 1007 Gross per 24 hour  Intake 720 ml  Output 400 ml  Net 320 ml     Wt Readings from Last 3 Encounters:  12/10/17 51.7 kg (113 lb 15.7 oz)  04/02/17 49.9 kg (110 lb)  03/11/17 49.3 kg (108 lb 12 oz)     Exam  General: Alert and oriented x 3, NAD  Eyes: PERRLA, EOMI, Anicteric Sclera,  HEENT:  Atraumatic, normocephalic,  Cardiovascular: S1 S2 auscultated, Regular rate and rhythm.  Respiratory: Clear to auscultation bilaterally, no wheezing, rales or rhonchi  Gastrointestinal: Soft, nontender, nondistended, + bowel sounds  Ext: no pedal edema bilaterally  Neuro: no new deficits  Musculoskeletal: No digital cyanosis, clubbing  Skin: No rashes  Psych: Normal affect and demeanor, alert and oriented x3    Data Reviewed:  I have personally reviewed following labs and imaging studies  Micro Results Recent Results (from the past 240 hour(s))  Urine culture     Status: Abnormal   Collection Time: 12/03/17  8:34 PM  Result Value Ref Range Status   Specimen Description URINE, CLEAN CATCH  Final   Special Requests   Final    NONE Performed at [facility], [address], [place], [place] [address]    Culture >=100,000 COLONIES/mL ESCHERICHIA COLI (A)  Final   Report Status 12/06/2017 FINAL  Final   Organism ID, Bacteria ESCHERICHIA COLI (A)  Final      Susceptibility   Escherichia coli - MIC*    AMPICILLIN <=2 SENSITIVE Sensitive     CEFAZOLIN <=4 SENSITIVE Sensitive     CEFTRIAXONE <=1 SENSITIVE Sensitive     CIPROFLOXACIN >=4 RESISTANT Resistant     GENTAMICIN <=1 SENSITIVE Sensitive     IMIPENEM <=0.25 SENSITIVE Sensitive     NITROFURANTOIN <=16 SENSITIVE Sensitive     TRIMETH/SULFA <=20 SENSITIVE Sensitive      AMPICILLIN/SULBACTAM <=2 SENSITIVE Sensitive     PIP/TAZO <=4 SENSITIVE Sensitive     Extended ESBL NEGATIVE Sensitive     * >=100,000 COLONIES/mL ESCHERICHIA COLI    Radiology Reports Ct Abdomen Pelvis W Contrast  Result Date: 12/04/2017 CLINICAL DATA:  84 y/o F; generalized weakness, nausea, vomiting, diarrhea. History of Crohn's. EXAM: CT ABDOMEN AND PELVIS WITH CONTRAST TECHNIQUE: Multidetector CT imaging of  the abdomen and pelvis was performed using the standard protocol following bolus administration of intravenous contrast. CONTRAST:  186m OMNIPAQUE IOHEXOL 300 MG/ML  SOLN COMPARISON:  11/02/2009 CT abdomen and pelvis. FINDINGS: Lower chest: Bilateral breast prostheses. Bilateral lower lobe platelike atelectasis. Small hiatal hernia. Hepatobiliary: No focal liver abnormality is seen. No gallstones, gallbladder wall thickening, or biliary dilatation. Pancreas: Unremarkable. No pancreatic ductal dilatation or surrounding inflammatory changes. Spleen: Normal in size without focal abnormality. Adrenals/Urinary Tract: Adrenal glands are unremarkable. Kidneys are normal, without renal calculi, focal lesion, or hydronephrosis. Bladder is unremarkable. Stomach/Bowel: There is a short segment of wall thickening enhancement of distal small bowel in the lower abdomen compatible with acute inflammation (series 3, image 55). There is a downstream segment of stricture without thickening compatible with chronic sequelae of Crohn's. Additionally, there is diffuse fatty wall thickening without stricture of small bowel in the right hemiabdomen compatible with chronic inflammatory change. No obstructive or inflammatory changes of the colon. Sigmoid diverticulosis without findings of acute diverticulitis. Vascular/Lymphatic: Aortic atherosclerosis. No enlarged abdominal or pelvic lymph nodes. Reproductive: Uterus and bilateral adnexa are unremarkable. Other: No abdominal wall hernia or abnormality. No abdominopelvic  ascites. Musculoskeletal: Moderate levocurvature of the spine with apex at L2. No acute osseous abnormality identified. IMPRESSION: 1. Acute inflammation of short segment of small bowel in the lower abdomen. Downstream segment of stricture without inflammation compatible with fibrostenotic chronic Crohn's. No fistula identified. 2. Diffuse fatty wall thickening of small bowel in the right hemiabdomen indicating chronic inflammatory change. 3. Sigmoid diverticulosis without findings of acute diverticulitis. 4. Small hiatal hernia. Electronically Signed   By: LKristine GarbeM.D.   On: 12/04/2017 02:18    Lab Data:  CBC: Recent Labs  Lab 12/03/17 2022 12/04/17 0643  WBC 8.6 8.6  NEUTROABS  --  7.2  HGB 16.9* 16.0*  HCT 50.9* 47.0*  MCV 93.2 92.7  PLT 281 2662  Basic Metabolic Panel: Recent Labs  Lab 12/04/17 0643 12/05/17 0652 12/06/17 0924 12/07/17 0458 12/08/17 0500  NA 142 140 142 140 139  K 3.8 5.2* 3.1* 3.6 3.3*  CL 103 107 99 103 100  CO2 30 27 30 27 30   GLUCOSE 140* 117* 98 106* 100*  BUN 19 16 29* 47* 46*  CREATININE 0.97 0.95 0.97 1.07* 0.91  CALCIUM 9.2 8.7* 9.4 9.2 9.0   GFR: Estimated Creatinine Clearance: 36 mL/min (by C-G formula based on SCr of 0.91 mg/dL). Liver Function Tests: Recent Labs  Lab 12/03/17 2022 12/04/17 0643  AST 25 22  ALT 20 18  ALKPHOS 69 90  BILITOT 1.2 0.8  PROT 7.5 6.7  ALBUMIN 3.9 3.6   Recent Labs  Lab 12/03/17 2022  LIPASE 43   No results for input(s): AMMONIA in the last 168 hours. Coagulation Profile: No results for input(s): INR, PROTIME in the last 168 hours. Cardiac Enzymes: Recent Labs  Lab 12/03/17 2022  TROPONINI <0.03   BNP (last 3 results) No results for input(s): PROBNP in the last 8760 hours. HbA1C: No results for input(s): HGBA1C in the last 72 hours. CBG: Recent Labs  Lab 12/04/17 0445  GLUCAP 115*   Lipid Profile: No results for input(s): CHOL, HDL, LDLCALC, TRIG, CHOLHDL, LDLDIRECT  in the last 72 hours. Thyroid Function Tests: No results for input(s): TSH, T4TOTAL, FREET4, T3FREE, THYROIDAB in the last 72 hours. Anemia Panel: No results for input(s): VITAMINB12, FOLATE, FERRITIN, TIBC, IRON, RETICCTPCT in the last 72 hours. Urine analysis:    Component Value  Date/Time   COLORURINE YELLOW 12/03/2017 2034   APPEARANCEUR CLOUDY (A) 12/03/2017 2034   LABSPEC 1.013 12/03/2017 2034   PHURINE 8.0 12/03/2017 2034   GLUCOSEU NEGATIVE 12/03/2017 2034   HGBUR NEGATIVE 12/03/2017 2034   BILIRUBINUR NEGATIVE 12/03/2017 2034   BILIRUBINUR + 07/14/2015 [place] 12/03/2017 2034   PROTEINUR 100 (A) 12/03/2017 2034   UROBILINOGEN negative 07/14/2015 1655   UROBILINOGEN 0.2 05/16/2013 1434   NITRITE NEGATIVE 12/03/2017 2034   LEUKOCYTESUR LARGE (A) 12/03/2017 2034     Welcome Fults M.D. [facility] 12/10/2017, 1:57 PM  Pager: [phone] Between 7am to 7pm - call Pager - [phone]  After 7pm go to [email or web address] - password [email or web address]  Call night coverage person covering after 7pm

## 2017-12-10 NOTE — Progress Notes (Signed)
Family is wanting appeal the discharge and Appeal office at  [facility] [phone] will be open tomorrow. PT /OT needs to re-evaluate.

## 2017-12-11 LAB — CREATININE, SERUM
Creatinine, Ser: 0.93 mg/dL (ref 0.44–1.00)
GFR calc Af Amer: >60 mL/min (ref >60)
GFR, EST NON AFRICAN AMERICAN: 56 mL/min — AB (ref >60)

## 2017-12-11 NOTE — Progress Notes (Signed)
Attempted to call report to [facility] place. No one answered the phone.

## 2017-12-11 NOTE — Progress Notes (Signed)
Attempted to file appeal with [employer] [facility]- left voicemail but message states their offices are closed today.  Family agreeable to private pay while attempting to appeal [employer] decision.  Patient will discharge to [facility] Anticipated discharge date: 7/4 Family notified: pt son Dulce Sellar by [employer]- called at 1:30pm- anticipate pick up between 2:30-3pm  [place] signing off.  Jorge Ny, LCSW Clinical Social Worker [phone]

## 2017-12-11 NOTE — Progress Notes (Signed)
Physical Therapy Treatment Patient Details Name: [name] MRN: [id number] DOB: 12-Jul-1932 Today's Date: 12/11/2017    History of Present Illness Patient is a 84 y/o female admitted wtih nausea and vomitting for ~2 days. In the ER patient had CT abdomen and pelvis done which shows inflammation of short segment of small bowel in the lower abdomen with downstream stricture with no inflammation.  Also has features of chronic Crohn's.  UA is compatible with UTI. Patient with a PMH significant for Crohn's disease.    PT Comments    PT arrived with nurse tech assisting patient. PT session focusing on dynamic standing balance activities with patient requiring B UE support along with external support from PT, as well as improving safe functional mobility. Patient continues to require Min A with ambulation with patient demonstrating poor safety awareness even with max cueing for safety with AD, along with scanning of environment. PT highly recommending SNF at this time to maximize safe functional mobility for reduced fall risk, as patient currently high risk for falls and is very unsafe to return home with living independently.   Follow Up Recommendations  SNF;Supervision/Assistance - 24 hour     Equipment Recommendations  Other (comment)(TBD at next venue of care)    Recommendations for Other Services       Precautions / Restrictions Precautions Precautions: Fall Restrictions Weight Bearing Restrictions: No    Mobility  Bed Mobility Overal bed mobility: Needs Assistance Bed Mobility: Sit to Supine       Sit to supine: Min guard   General bed mobility comments: verbal cueing for safety and sequencing; once in bed required verbal cueing for repositioning  Transfers Overall transfer level: Needs assistance Equipment used: Rolling walker (2 wheeled) Transfers: Sit to/from Stand Sit to Stand: Min guard         General transfer comment: verbal cueing for hand placement to not  pull from RW; requires cueing to not reach for objects as they may move and increase her fall risk  Ambulation/Gait Ambulation/Gait assistance: Min assist;Min guard Gait Distance (Feet): 100 Feet Assistive device: Rolling walker (2 wheeled) Gait Pattern/deviations: Step-through pattern;Decreased stride length;Drifts right/left;Trunk flexed Gait velocity: decreased   General Gait Details: max cueing for safety with RW today; does at time run into objects without cueing for scanning of environment; poor safety awareness   Stairs             Wheelchair Mobility    Modified Rankin (Stroke Patients Only)       Balance Overall balance assessment: Needs assistance Sitting-balance support: No upper extremity supported;Feet supported Sitting balance-Leahy Scale: Fair     Standing balance support: Bilateral upper extremity supported;During functional activity Standing balance-Leahy Scale: Poor Standing balance comment: reliant on external support for balance                            Cognition Arousal/Alertness: Awake/alert Behavior During Therapy: WFL for tasks assessed/performed Overall Cognitive Status: Impaired/Different from baseline Area of Impairment: Memory;Following commands;Safety/judgement;Problem solving                     Memory: Decreased short-term memory Following Commands: Follows one step commands with increased time Safety/Judgement: Decreased awareness of safety;Decreased awareness of deficits   Problem Solving: Slow processing;Decreased initiation;Difficulty sequencing;Requires verbal cues;Requires tactile cues        Exercises      General Comments        Pertinent  Vitals/Pain Pain Assessment: No/denies pain    Home Living                      Prior Function            PT Goals (current goals can now be found in the care plan section) Acute Rehab PT Goals Patient Stated Goal: return home PT Goal  Formulation: With patient Time For Goal Achievement: 12/18/17 Potential to Achieve Goals: Good Progress towards PT goals: Progressing toward goals    Frequency    Min 3X/week      PT Plan Current plan remains appropriate    Co-evaluation              AM-PAC PT "6 Clicks" Daily Activity  Outcome Measure  Difficulty turning over in bed (including adjusting bedclothes, sheets and blankets)?: A Little Difficulty moving from lying on back to sitting on the side of the bed? : Unable Difficulty sitting down on and standing up from a chair with arms (e.g., wheelchair, bedside commode, etc,.)?: Unable Help needed moving to and from a bed to chair (including a wheelchair)?: A Little Help needed walking in hospital room?: A Little Help needed climbing 3-5 steps with a railing? : A Little 6 Click Score: 14    End of Session Equipment Utilized During Treatment: Gait belt Activity Tolerance: Patient tolerated treatment well Patient left: in bed;with call bell/phone within reach;with bed alarm set Nurse Communication: Mobility status PT Visit Diagnosis: Unsteadiness on feet (R26.81);Other abnormalities of gait and mobility (R26.89);Muscle weakness (generalized) (M62.81)     Time: 2446-2863 PT Time Calculation (min) (ACUTE ONLY): 28 min  Charges:  $Gait Training: 8-22 mins $Therapeutic Activity: 8-22 mins                    G Codes:       Lanney Gins, PT, DPT 12/11/17 3:23 PM

## 2017-12-11 NOTE — Progress Notes (Signed)
Pt is confused, offered bath, she said she is not dirty and does not see the point in a bath today because she might leave.  PT is on the phone with family because PT thinks she is discharged.  Will follow up about bath.

## 2017-12-11 NOTE — Progress Notes (Signed)
[facility]                                                                              Patient Demographics  Tailor Lucking, is a 84 y.o. female, DOB - 20-Mar-1933, GDJ:[id number]  Admit date - 12/03/2017   Admitting Physician Rise Patience, MD  Outpatient Primary MD for the patient is Tonia Ghent, MD  Outpatient specialists:   LOS - 6  days   Medical records reviewed and are as summarized below:    Chief Complaint  Patient presents with  . Weakness  . Emesis       Brief summary   84 year old female with past medical history of Crohn's disease and diastolic heart failure presented to the emergency room on the evening of 6/26 with 1 day history of nausea and vomiting. No abdominal pain or diarrhea. In the emergency room, patient found to have elevated blood pressures with a systolic ranging 622W-979G, tract infection and inflammation of the short segment of the small bowel of the lower abdomen with downstream stricture. Patient was started on IV fluid hydration and antibiotics.   Assessment & Plan    Acute cystitis/UTI without hematuria -Urine culture was positive for E. coli resistant only to ciprofloxacin. -Patient was placed on IV Rocephin, changed over to ampicillin and has completed a full course of antibiotics on 7/2  Moderate protein calorie malnutrition possibly due to Crohn's disease -Patient was seen by nutrition and placed on nutritional supplements 3 times daily  Essential hypertension -BP currently stable, continue Cozaar, hydralazine   Acute on chronic diastolic CHF  -BNP mildly elevated, 2D echo on 6/27 showed grade 1 diastolic dysfunction, EF 70 to 75% -Patient was placed on IV Lasix and diuresed, Lasix now discontinued -Continue low-dose Cozaar.  Crohn's ileitis, presented with nausea and vomiting -GI was consulted and initially started on IV Solu-Medrol now changed to budesonide -Patient tolerating soft diet without  any difficulty, outpatient follow-up with GI -No acute issues   Generalized debility -PT recommended skilled nursing facility.  Per physical therapy, has poor awareness of her surroundings and poor safety awareness.  Lives at home alone   Code Status: Full CODE STATUS DVT Prophylaxis:  Lovenox Family Communication: Discussed in detail with the patient, all imaging results, lab results explained to the patient  Disposition Plan: Patient was denied by her insurance for skilled nursing facility.  Family has decided to appeal the discharge, [place] office is currently closed [phone]  Time Spent in minutes 15 minutes  Procedures:    Consultants:   GI  Antimicrobials:      Medications  Scheduled Meds: . budesonide  9 mg Oral Daily  . enoxaparin (LOVENOX) injection  40 mg Subcutaneous Q24H  . feeding supplement (ENSURE ENLIVE)  237 mL Oral TID BM  . hydrALAZINE  25 mg Oral Q8H  . losartan  25 mg Oral Daily  . polyethylene glycol  17 g Oral Daily   Continuous Infusions: PRN Meds:.acetaminophen **OR** acetaminophen, hydrALAZINE, ondansetron **OR** ondansetron (ZOFRAN) IV   Antibiotics   Anti-infectives (From admission, onward)   Start     Dose/Rate Route Frequency Ordered Stop  12/07/17 1000  amoxicillin (AMOXIL) capsule 500 mg     500 mg Oral Every 12 hours 12/06/17 1101 12/09/17 1109   12/05/17 0400  cefTRIAXone (ROCEPHIN) 1 g in sodium chloride 0.9 % 100 mL IVPB  Status:  Discontinued     1 g 200 mL/hr over 30 Minutes Intravenous Every 24 hours 12/04/17 0538 12/06/17 1100   12/04/17 0315  cefTRIAXone (ROCEPHIN) 2 g in sodium chloride 0.9 % 100 mL IVPB     2 g 200 mL/hr over 30 Minutes Intravenous  Once 12/04/17 0314 12/04/17 0418        Subjective:   Roshana Shuffield was seen and examined today.  Currently no complaints, denies any dizziness, chest pain, shortness of breath, abdominal pain.  No diarrhea or constipation.  No acute events overnight.   Objective:    Vitals:   12/10/17 0445 12/10/17 1514 12/10/17 2153 12/11/17 0537  BP: 123/78 (!) 145/79 (!) 144/66 134/77  Pulse: 79 92 95 78  Resp: 18 19 18 16   Temp: 98.6 F (37 C) 99 F (37.2 C) 98.6 F (37 C) 98.2 F (36.8 C)  TempSrc: Oral  Oral Oral  SpO2: 99% 96% 97% 100%  Weight: 51.7 kg (113 lb 15.7 oz)   53.8 kg (118 lb 9.7 oz)  Height:        Intake/Output Summary (Last 24 hours) at 12/11/2017 1148 Last data filed at 12/11/2017 0542 Gross per 24 hour  Intake 240 ml  Output 400 ml  Net -160 ml     Wt Readings from Last 3 Encounters:  12/11/17 53.8 kg (118 lb 9.7 oz)  04/02/17 49.9 kg (110 lb)  03/11/17 49.3 kg (108 lb 12 oz)     Exam   General: Alert and oriented x 3, NAD  Eyes:   HEENT:  Atraumatic, normocephalic  Cardiovascular: S1 S2 auscultated, RRR, No pedal edema b/l  Respiratory: Clear to auscultation bilaterally, no wheezing, rales or rhonchi  Gastrointestinal: Soft, nontender, nondistended, + bowel sounds  Ext: no pedal edema bilaterally  Neuro: no new deficits  Musculoskeletal: No digital cyanosis, clubbing  Skin: No rashes  Psych: Normal affect and demeanor, alert and oriented x3    Data Reviewed:  I have personally reviewed following labs and imaging studies  Micro Results Recent Results (from the past 240 hour(s))  Urine culture     Status: Abnormal   Collection Time: 12/03/17  8:34 PM  Result Value Ref Range Status   Specimen Description URINE, CLEAN CATCH  Final   Special Requests   Final    NONE Performed at [facility], [address], [place], [place] [address]    Culture >=100,000 COLONIES/mL ESCHERICHIA COLI (A)  Final   Report Status 12/06/2017 FINAL  Final   Organism ID, Bacteria ESCHERICHIA COLI (A)  Final      Susceptibility   Escherichia coli - MIC*    AMPICILLIN <=2 SENSITIVE Sensitive     CEFAZOLIN <=4 SENSITIVE Sensitive     CEFTRIAXONE <=1 SENSITIVE Sensitive     CIPROFLOXACIN >=4 RESISTANT Resistant      GENTAMICIN <=1 SENSITIVE Sensitive     IMIPENEM <=0.25 SENSITIVE Sensitive     NITROFURANTOIN <=16 SENSITIVE Sensitive     TRIMETH/SULFA <=20 SENSITIVE Sensitive     AMPICILLIN/SULBACTAM <=2 SENSITIVE Sensitive     PIP/TAZO <=4 SENSITIVE Sensitive     Extended ESBL NEGATIVE Sensitive     * >=100,000 COLONIES/mL ESCHERICHIA COLI    Radiology Reports Ct Abdomen  Pelvis W Contrast  Result Date: 12/04/2017 CLINICAL DATA:  84 y/o F; generalized weakness, nausea, vomiting, diarrhea. History of Crohn's. EXAM: CT ABDOMEN AND PELVIS WITH CONTRAST TECHNIQUE: Multidetector CT imaging of the abdomen and pelvis was performed using the standard protocol following bolus administration of intravenous contrast. CONTRAST:  121m OMNIPAQUE IOHEXOL 300 MG/ML  SOLN COMPARISON:  11/02/2009 CT abdomen and pelvis. FINDINGS: Lower chest: Bilateral breast prostheses. Bilateral lower lobe platelike atelectasis. Small hiatal hernia. Hepatobiliary: No focal liver abnormality is seen. No gallstones, gallbladder wall thickening, or biliary dilatation. Pancreas: Unremarkable. No pancreatic ductal dilatation or surrounding inflammatory changes. Spleen: Normal in size without focal abnormality. Adrenals/Urinary Tract: Adrenal glands are unremarkable. Kidneys are normal, without renal calculi, focal lesion, or hydronephrosis. Bladder is unremarkable. Stomach/Bowel: There is a short segment of wall thickening enhancement of distal small bowel in the lower abdomen compatible with acute inflammation (series 3, image 55). There is a downstream segment of stricture without thickening compatible with chronic sequelae of Crohn's. Additionally, there is diffuse fatty wall thickening without stricture of small bowel in the right hemiabdomen compatible with chronic inflammatory change. No obstructive or inflammatory changes of the colon. Sigmoid diverticulosis without findings of acute diverticulitis. Vascular/Lymphatic: Aortic atherosclerosis. No  enlarged abdominal or pelvic lymph nodes. Reproductive: Uterus and bilateral adnexa are unremarkable. Other: No abdominal wall hernia or abnormality. No abdominopelvic ascites. Musculoskeletal: Moderate levocurvature of the spine with apex at L2. No acute osseous abnormality identified. IMPRESSION: 1. Acute inflammation of short segment of small bowel in the lower abdomen. Downstream segment of stricture without inflammation compatible with fibrostenotic chronic Crohn's. No fistula identified. 2. Diffuse fatty wall thickening of small bowel in the right hemiabdomen indicating chronic inflammatory change. 3. Sigmoid diverticulosis without findings of acute diverticulitis. 4. Small hiatal hernia. Electronically Signed   By: LKristine GarbeM.D.   On: 12/04/2017 02:18    Lab Data:  CBC: No results for input(s): WBC, NEUTROABS, HGB, HCT, MCV, PLT in the last 168 hours. Basic Metabolic Panel: Recent Labs  Lab 12/05/17 0652 12/06/17 0924 12/07/17 0458 12/08/17 0500 12/11/17 0422  NA 140 142 140 139  --   K 5.2* 3.1* 3.6 3.3*  --   CL 107 99 103 100  --   CO2 27 30 27 30   --   GLUCOSE 117* 98 106* 100*  --   BUN 16 29* 47* 46*  --   CREATININE 0.95 0.97 1.07* 0.91 0.93  CALCIUM 8.7* 9.4 9.2 9.0  --    GFR: Estimated Creatinine Clearance: 35.2 mL/min (by C-G formula based on SCr of 0.93 mg/dL). Liver Function Tests: No results for input(s): AST, ALT, ALKPHOS, BILITOT, PROT, ALBUMIN in the last 168 hours. No results for input(s): LIPASE, AMYLASE in the last 168 hours. No results for input(s): AMMONIA in the last 168 hours. Coagulation Profile: No results for input(s): INR, PROTIME in the last 168 hours. Cardiac Enzymes: No results for input(s): CKTOTAL, CKMB, CKMBINDEX, TROPONINI in the last 168 hours. BNP (last 3 results) No results for input(s): PROBNP in the last 8760 hours. HbA1C: No results for input(s): HGBA1C in the last 72 hours. CBG: No results for input(s): GLUCAP in  the last 168 hours. Lipid Profile: No results for input(s): CHOL, HDL, LDLCALC, TRIG, CHOLHDL, LDLDIRECT in the last 72 hours. Thyroid Function Tests: No results for input(s): TSH, T4TOTAL, FREET4, T3FREE, THYROIDAB in the last 72 hours. Anemia Panel: No results for input(s): VITAMINB12, FOLATE, FERRITIN, TIBC, IRON, RETICCTPCT in the last 72  hours. Urine analysis:    Component Value Date/Time   COLORURINE YELLOW 12/03/2017 2034   APPEARANCEUR CLOUDY (A) 12/03/2017 2034   LABSPEC 1.013 12/03/2017 2034   PHURINE 8.0 12/03/2017 2034   GLUCOSEU NEGATIVE 12/03/2017 2034   HGBUR NEGATIVE 12/03/2017 2034   BILIRUBINUR NEGATIVE 12/03/2017 2034   BILIRUBINUR + 07/14/2015 [place] 12/03/2017 2034   PROTEINUR 100 (A) 12/03/2017 2034   UROBILINOGEN negative 07/14/2015 1655   UROBILINOGEN 0.2 05/16/2013 1434   NITRITE NEGATIVE 12/03/2017 2034   LEUKOCYTESUR LARGE (A) 12/03/2017 2034     Graci Hulce M.D. [facility] 12/11/2017, 11:48 AM  Pager: [phone] Between 7am to 7pm - call Pager - [phone]  After 7pm go to [email or web address] - password [email or web address]  Call night coverage person covering after 7pm

## 2017-12-11 NOTE — Discharge Summary (Signed)
Physician Discharge Summary   Patient ID: [name] MRN: [id number] DOB/AGE: Dec 20, 1932 84 y.o.  Admit date: 12/03/2017 Discharge date: 12/11/2017  Primary Care Physician:  Tonia Ghent, MD   Recommendations for Outpatient Follow-up:  1. New medication: Cozaar 50 mg p.o. daily 2. New medication: Hydralazine 25 mg p.o. 3 times daily 3. New medication: Budesonide 9 mg p.o. daily   Home Health: None, patient being discharged to skilled nursing facility Equipment/Devices:   Discharge Condition: stable CODE STATUS:  DNR   Diet recommendation: Heart healthy, soft diet   Discharge Diagnoses:      . Crohn's ileitis with nausea and vomiting . Elevated blood pressure reading . Nausea & vomiting . Chronic diastolic CHF (congestive heart failure) ([place]) . Acute diastolic (congestive) heart failure ([facility])  Acute cystitis without hematuria  Moderate degree malnutrition  Essential hypertension  Consults: None    Allergies:  No Known Allergies   DISCHARGE MEDICATIONS: Allergies as of 12/11/2017   No Known Allergies     Medication List    STOP taking these medications   cholecalciferol 1000 units tablet Commonly known as:  VITAMIN D     TAKE these medications   acetaminophen 325 MG tablet Commonly known as:  TYLENOL Take 2 tablets (650 mg total) by mouth every 6 (six) hours as needed for mild pain (or Fever >/= 101).   budesonide 3 MG 24 hr capsule Commonly known as:  ENTOCORT EC Take 3 capsules (9 mg total) by mouth daily.   calcium-vitamin D 500-200 MG-UNIT tablet Commonly known as:  OSCAL WITH D Take 1 tablet by mouth 2 (two) times daily.   feeding supplement (ENSURE ENLIVE) Liqd Take 237 mLs by mouth 3 (three) times daily between meals.   hydrALAZINE 25 MG tablet Commonly known as:  APRESOLINE Take 1 tablet (25 mg total) by mouth every 8 (eight) hours.   losartan 25 MG tablet Commonly known as:  COZAAR Take 1 tablet (25 mg total) by mouth daily.    multivitamin tablet Take 1 tablet by mouth daily.        Brief H and P: For complete details please refer to admission H and P, but in brief 84 year old female with past medical history of Crohn's disease and diastolic heart failure presented to the emergency room on the evening of 6/26 with 1 day history of nausea and vomiting. No abdominal pain or diarrhea. In the emergency room, patient found to have elevated blood pressures with a systolic ranging 361W-431V, tract infection and inflammation of the short segment of the small bowel of the lower abdomen with downstream stricture. Patient was started on IV fluid hydration and antibiotics.    Hospital Course:  Acute cystitis/UTI without hematuria -Urine culture was positive for E. coli resistant only to ciprofloxacin. -Patient was placed on IV Rocephin, changed over to ampicillin and has completed a full course of antibiotics on 7/2  Moderate protein calorie malnutrition possibly due to Crohn's disease -Patient was seen by nutrition and placed on nutritional supplements 3 times daily  Essential hypertension -BP currently stable, continue Cozaar.  Hydralazine was added as BP was still somewhat elevated  Acute on chronic diastolic CHF  -BNP mildly elevated, 2D echo on 6/27 showed grade 1 diastolic dysfunction, EF 70 to 75% -Patient was placed on IV Lasix and diuresed, Lasix now discontinued -Continue low-dose Cozaar.  Crohn's ileitis, presented with nausea and vomiting -GI was consulted and initially started on IV Solu-Medrol now changed to budesonide -Patient tolerating soft  diet without any difficulty, outpatient follow-up with GI scheduled on 7/23 -No acute issues   Generalized debility -PT recommended skilled nursing facility.  Per physical therapy, has poor awareness of her surroundings and poor safety awareness.  Lives at home alone   Day of Discharge S: Feeling better, no acute issues, no nausea vomiting or  diarrhea.  BP 134/77 (BP Location: Right Arm)   Pulse 78   Temp 98.2 F (36.8 C) (Oral)   Resp 16   Ht 5' 1"  (1.549 m)   Wt 53.8 kg (118 lb 9.7 oz)   SpO2 100%   BMI 22.41 kg/m   Physical Exam: General: Alert and awake oriented x3 not in any acute distress. HEENT: anicteric sclera, pupils reactive to light and accommodation CVS: S1-S2 clear no murmur rubs or gallops Chest: clear to auscultation bilaterally, no wheezing rales or rhonchi Abdomen: soft nontender, nondistended, normal bowel sounds Extremities: no cyanosis, clubbing or edema noted bilaterally Neuro: Cranial nerves II-XII intact, no focal neurological deficits   The results of significant diagnostics from this hospitalization (including imaging, microbiology, ancillary and laboratory) are listed below for reference.      Procedures/Studies:  Ct Abdomen Pelvis W Contrast  Result Date: 12/04/2017 CLINICAL DATA:  84 y/o F; generalized weakness, nausea, vomiting, diarrhea. History of Crohn's. EXAM: CT ABDOMEN AND PELVIS WITH CONTRAST TECHNIQUE: Multidetector CT imaging of the abdomen and pelvis was performed using the standard protocol following bolus administration of intravenous contrast. CONTRAST:  115m OMNIPAQUE IOHEXOL 300 MG/ML  SOLN COMPARISON:  11/02/2009 CT abdomen and pelvis. FINDINGS: Lower chest: Bilateral breast prostheses. Bilateral lower lobe platelike atelectasis. Small hiatal hernia. Hepatobiliary: No focal liver abnormality is seen. No gallstones, gallbladder wall thickening, or biliary dilatation. Pancreas: Unremarkable. No pancreatic ductal dilatation or surrounding inflammatory changes. Spleen: Normal in size without focal abnormality. Adrenals/Urinary Tract: Adrenal glands are unremarkable. Kidneys are normal, without renal calculi, focal lesion, or hydronephrosis. Bladder is unremarkable. Stomach/Bowel: There is a short segment of wall thickening enhancement of distal small bowel in the lower abdomen  compatible with acute inflammation (series 3, image 55). There is a downstream segment of stricture without thickening compatible with chronic sequelae of Crohn's. Additionally, there is diffuse fatty wall thickening without stricture of small bowel in the right hemiabdomen compatible with chronic inflammatory change. No obstructive or inflammatory changes of the colon. Sigmoid diverticulosis without findings of acute diverticulitis. Vascular/Lymphatic: Aortic atherosclerosis. No enlarged abdominal or pelvic lymph nodes. Reproductive: Uterus and bilateral adnexa are unremarkable. Other: No abdominal wall hernia or abnormality. No abdominopelvic ascites. Musculoskeletal: Moderate levocurvature of the spine with apex at L2. No acute osseous abnormality identified. IMPRESSION: 1. Acute inflammation of short segment of small bowel in the lower abdomen. Downstream segment of stricture without inflammation compatible with fibrostenotic chronic Crohn's. No fistula identified. 2. Diffuse fatty wall thickening of small bowel in the right hemiabdomen indicating chronic inflammatory change. 3. Sigmoid diverticulosis without findings of acute diverticulitis. 4. Small hiatal hernia. Electronically Signed   By: LKristine GarbeM.D.   On: 12/04/2017 02:18       LAB RESULTS: Basic Metabolic Panel: Recent Labs  Lab 12/07/17 0458 12/08/17 0500 12/11/17 0422  NA 140 139  --   K 3.6 3.3*  --   CL 103 100  --   CO2 27 30  --   GLUCOSE 106* 100*  --   BUN 47* 46*  --   CREATININE 1.07* 0.91 0.93  CALCIUM 9.2 9.0  --  Liver Function Tests: No results for input(s): AST, ALT, ALKPHOS, BILITOT, PROT, ALBUMIN in the last 168 hours. No results for input(s): LIPASE, AMYLASE in the last 168 hours. No results for input(s): AMMONIA in the last 168 hours. CBC: No results for input(s): WBC, NEUTROABS, HGB, HCT, MCV, PLT in the last 168 hours. Cardiac Enzymes: No results for input(s): CKTOTAL, CKMB, CKMBINDEX,  TROPONINI in the last 168 hours. BNP: Invalid input(s): POCBNP CBG: No results for input(s): GLUCAP in the last 168 hours.    Disposition and Follow-up:    DISPOSITION: Skilled nursing facility  DISCHARGE FOLLOW-UP  Contact information for follow-up providers    Tonia Ghent, MD. Schedule an appointment as soon as possible for a visit in 2 days.   Specialty:  Family Medicine Contact information: [place] [address] [phone]        Alfredia Ferguson, PA-C Follow up on 12/30/2017.   Specialty:  Gastroenterology Why:  11 AM.  follow up with GI for Crohn's disease.   Contact information: [address] [place] [address] [phone]            Contact information for after-discharge care    Destination    [facility] .   Service:  Skilled Nursing Contact information: [address] [place] [phone]                   Time coordinating discharge:  25 minutes  Signed:   Estill Cotta M.D. [facility] 12/11/2017, 1:08 PM Pager: [phone]

## 2017-12-11 NOTE — Progress Notes (Signed)
Pt's son @ bedside requesting to speak with NCM.  NCM  @ bedside with son, requesting update on appeal/ [employer] denying SNF placement. Son provided NCM with [employer]'s appeal  # ,stating  [facility] needs more information. Stating MD needs to be more explicit with pt's condition in order to  get approval for SNF. NCM made CSW aware of  information and  # provided to to CSW. CSW to f/u with matter. While @ bedside son made call to [facility], insisting NCM not leave room and was told per Tammy @ [phone] the Medical Pre-certification  Department is waiiting for medical director to make decision on case. Son ready for pt go to next level of care. States pt can't be d/c to home.States pt lives alone and children all work. Pt would not have 24/7 supervision. NCM  explained to pt's son , mom is medically to go to next left of care . NCM informed pt/ family can d/cto SNF and privately pay while awaiting SNF appeal or pt can d/c to home with home health services with pt/family privately pay for 24/7 sitter service. Whitman Hero RN,CM

## 2017-12-12 DIAGNOSIS — I5032 Chronic diastolic (congestive) heart failure: Secondary | ICD-10-CM | POA: Diagnosis not present

## 2017-12-12 DIAGNOSIS — I10 Essential (primary) hypertension: Secondary | ICD-10-CM | POA: Diagnosis not present

## 2017-12-12 DIAGNOSIS — N390 Urinary tract infection, site not specified: Secondary | ICD-10-CM | POA: Diagnosis not present

## 2017-12-12 DIAGNOSIS — K5010 Crohn's disease of large intestine without complications: Secondary | ICD-10-CM | POA: Diagnosis not present

## 2017-12-15 DIAGNOSIS — R531 Weakness: Secondary | ICD-10-CM | POA: Diagnosis not present

## 2017-12-17 DIAGNOSIS — E440 Moderate protein-calorie malnutrition: Secondary | ICD-10-CM | POA: Diagnosis not present

## 2017-12-17 DIAGNOSIS — R6889 Other general symptoms and signs: Secondary | ICD-10-CM | POA: Diagnosis not present

## 2017-12-17 DIAGNOSIS — R413 Other amnesia: Secondary | ICD-10-CM | POA: Diagnosis not present

## 2017-12-17 DIAGNOSIS — R531 Weakness: Secondary | ICD-10-CM | POA: Diagnosis not present

## 2017-12-23 ENCOUNTER — Ambulatory Visit: Payer: Medicare HMO

## 2017-12-23 ENCOUNTER — Ambulatory Visit: Payer: Medicare HMO | Admitting: Family Medicine

## 2017-12-23 DIAGNOSIS — Z0289 Encounter for other administrative examinations: Secondary | ICD-10-CM

## 2017-12-24 DIAGNOSIS — R6889 Other general symptoms and signs: Secondary | ICD-10-CM | POA: Diagnosis not present

## 2017-12-24 DIAGNOSIS — R69 Illness, unspecified: Secondary | ICD-10-CM | POA: Diagnosis not present

## 2017-12-24 DIAGNOSIS — R5381 Other malaise: Secondary | ICD-10-CM | POA: Diagnosis not present

## 2017-12-26 ENCOUNTER — Telehealth: Payer: Self-pay | Admitting: Internal Medicine

## 2017-12-26 DIAGNOSIS — K5010 Crohn's disease of large intestine without complications: Secondary | ICD-10-CM | POA: Diagnosis not present

## 2017-12-26 DIAGNOSIS — R110 Nausea: Secondary | ICD-10-CM | POA: Diagnosis not present

## 2017-12-26 DIAGNOSIS — R630 Anorexia: Secondary | ICD-10-CM | POA: Diagnosis not present

## 2017-12-26 DIAGNOSIS — E440 Moderate protein-calorie malnutrition: Secondary | ICD-10-CM | POA: Diagnosis not present

## 2017-12-26 NOTE — Telephone Encounter (Signed)
Left message for patient to call back  

## 2017-12-26 NOTE — Telephone Encounter (Signed)
Daughter reports that her mother continues to have issues with nausea and vomiting.  She feels she may need to be seen prior to there OV on Tuesday with Amy Esterwood PA .  Daughter advised that if her mom needs medical tx prior to OV next week she should take her to to the ED for evaluation.  She is currently a resident in [facility]. Daughter was unaware of the appt next week.  She will discuss with her sisters and if mom continues to deteriorate they will take her to the ED

## 2017-12-29 DIAGNOSIS — R509 Fever, unspecified: Secondary | ICD-10-CM | POA: Diagnosis not present

## 2017-12-29 DIAGNOSIS — R110 Nausea: Secondary | ICD-10-CM | POA: Diagnosis not present

## 2017-12-29 DIAGNOSIS — R69 Illness, unspecified: Secondary | ICD-10-CM | POA: Diagnosis not present

## 2017-12-29 DIAGNOSIS — E440 Moderate protein-calorie malnutrition: Secondary | ICD-10-CM | POA: Diagnosis not present

## 2017-12-30 ENCOUNTER — Ambulatory Visit: Payer: Medicare HMO | Admitting: Physician Assistant

## 2017-12-30 ENCOUNTER — Ambulatory Visit (INDEPENDENT_AMBULATORY_CARE_PROVIDER_SITE_OTHER)
Admission: RE | Admit: 2017-12-30 | Discharge: 2017-12-30 | Disposition: A | Discharge status: Home / Self Care | Payer: Medicare HMO | Source: Ambulatory Visit | Attending: Physician Assistant | Admitting: Physician Assistant

## 2017-12-30 ENCOUNTER — Encounter: Payer: Self-pay | Admitting: Physician Assistant

## 2017-12-30 ENCOUNTER — Other Ambulatory Visit (INDEPENDENT_AMBULATORY_CARE_PROVIDER_SITE_OTHER): Payer: Medicare HMO

## 2017-12-30 VITALS — BP 130/72 | HR 76 | Ht 61.0 in | Wt 113.5 lb

## 2017-12-30 DIAGNOSIS — R5381 Other malaise: Secondary | ICD-10-CM

## 2017-12-30 DIAGNOSIS — R112 Nausea with vomiting, unspecified: Secondary | ICD-10-CM

## 2017-12-30 DIAGNOSIS — K50019 Crohn's disease of small intestine with unspecified complications: Secondary | ICD-10-CM

## 2017-12-30 LAB — COMPREHENSIVE METABOLIC PANEL
ALK PHOS: 77 U/L (ref 39–117)
ALT: 28 U/L (ref 0–35)
AST: 18 U/L (ref 0–37)
Albumin: 3.5 g/dL (ref 3.5–5.2)
BUN: 17 mg/dL (ref 6–23)
CO2: 27 mEq/L (ref 19–32)
Calcium: 9.4 mg/dL (ref 8.4–10.5)
Chloride: 102 mEq/L (ref 96–112)
Creatinine, Ser: 0.95 mg/dL (ref 0.40–1.20)
GFR: 59.77 mL/min — ABNORMAL LOW (ref >60.00)
GLUCOSE: 109 mg/dL — AB (ref 70–99)
Potassium: 3.6 mEq/L (ref 3.5–5.1)
Sodium: 137 mEq/L (ref 135–145)
TOTAL PROTEIN: 7.2 g/dL (ref 6.0–8.3)
Total Bilirubin: 0.8 mg/dL (ref 0.2–1.2)

## 2017-12-30 LAB — CBC WITH DIFFERENTIAL/PLATELET
BASOS PCT: 0.3 % (ref 0.0–3.0)
Basophils Absolute: 0 10*3/uL (ref 0.0–0.1)
EOS ABS: 0.4 10*3/uL (ref 0.0–0.7)
EOS PCT: 3.3 % (ref 0.0–5.0)
HCT: 43.7 % (ref 36.0–46.0)
HEMOGLOBIN: 15.2 g/dL — AB (ref 12.0–15.0)
Lymphocytes Relative: 6.2 % — ABNORMAL LOW (ref 12.0–46.0)
Lymphs Abs: 0.7 10*3/uL (ref 0.7–4.0)
MCHC: 34.7 g/dL (ref 30.0–36.0)
MCV: 91.6 fl (ref 78.0–100.0)
MONOS PCT: 9.8 % (ref 3.0–12.0)
Monocytes Absolute: 1 10*3/uL (ref 0.1–1.0)
NEUTROS ABS: 8.6 10*3/uL — AB (ref 1.4–7.7)
Neutrophils Relative %: 80.4 % — ABNORMAL HIGH (ref 43.0–77.0)
PLATELETS: 282 10*3/uL (ref 150.0–400.0)
RBC: 4.78 Mil/uL (ref 3.87–5.11)
RDW: 12.6 % (ref 11.5–15.5)
WBC: 10.7 10*3/uL — ABNORMAL HIGH (ref 4.0–10.5)

## 2017-12-30 LAB — SEDIMENTATION RATE: SED RATE: 76 mm/h — AB (ref 0–30)

## 2017-12-30 NOTE — Patient Instructions (Addendum)
Your provider has requested that you go to the basement level for lab work before leaving today. Press "B" on the elevator. The lab is located at the first door on the left as you exit the elevator.  Your provider has requested that you have an abdominal x ray before leaving today. Please go to the basement floor to our Radiology department for the test.  You have been scheduled for an office follow up with Dr Carlean Purl on Monday, 01/26/18 at 3:00 PM.  If you are age 82 or older, your body mass index should be between 23-30. Your Body mass index is 21.45 kg/m. If this is out of the aforementioned range listed, please consider follow up with your Primary Care Provider.  If you are age 37 or younger, your body mass index should be between 19-25. Your Body mass index is 21.45 kg/m. If this is out of the aformentioned range listed, please consider follow up with your Primary Care Provider.

## 2017-12-30 NOTE — Progress Notes (Signed)
Subjective:    Patient ID: [name], female    DOB: [birth year], 84 y.o.   MRN: [id number]  HPI Shaleigh is an 84 year old white female, known to Dr. Arelia Longest who has history of Crohn's ileitis.  She had been on 6-MP in the past but had been noncompliant with continuation of medication.  She had not been seen in our office since January 2018. She comes in today after recent hospitalization 627 through 12/11/2017 after she presented to the emergency room with nausea vomiting which apparently had been intractable for 1 day. She had CT of the abdomen and pelvis showed bilateral lower lobe platelike atelectasis and acute inflammation of the short segment of small bowel in the lower abdomen and downstream segment of stricture without thickening consistent with chronic sequelae of Crohn's There was also diffuse fatty wall thickening without stricture of the small bowel in the right hemiabdomen consistent with chronic inflammatory change.  She had sigmoid diverticulosis no diverticulitis. She was also found to have a urinary tract infection and was treated with IV antibiotics.  She was seen in consultation by Dr. Arelia Longest during her admission.  She was initially treated with IV steroids and then it was suggested that she transition to budesonide on discharge 9 mg p.o. Daily. Was also diuresed for mild congestive heart failure. Patient felt to be debilitated after admission.  She had evaluation by physical therapy who recommended skilled nursing.  Patient is now residing at [facility] short-term for 30-day stay. She comes in today with a son and daughter who were noted to be arguing in the lobby prior to patient's visit.  Apparently the son has been spending the nights with her and her daughter has been helping out during the daytime. They are concerned because she continues to have episodes of nausea and vomiting, not necessarily occurring every day but has occurred frequently since discharge from the  hospital.  Patient's daughter related that about 4 days ago this past Friday she had a fever to 101 at the nursing home.  This weekend she had a episode of nausea and vomiting on Sunday.  She has not been having any diarrhea and patient currently denies any abdominal pain.  He does not seem to be too concerned about her symptoms and does not offer much to the conversation.  Her daughter says she has been saying that she wants to die.  When this was brought up she says yes I want to die, I do not have any reason to be here. Apparently she has been working with physical therapy but still has not been ambulating much, had not been leaving her room much. Family has numerous questions about her medications, her illness, prognosis, all of her medications and the care at the nursing home.   Review of Systems Pertinent positive and negative review of systems were noted in the above HPI section.  All other review of systems was otherwise negative.  Outpatient Encounter Medications as of 12/30/2017  Medication Sig  . acetaminophen (TYLENOL) 325 MG tablet Take 2 tablets (650 mg total) by mouth every 6 (six) hours as needed for mild pain (or Fever >/= 101).  . budesonide (ENTOCORT EC) 3 MG 24 hr capsule Take 3 capsules (9 mg total) by mouth daily.  . calcium-vitamin D (OSCAL WITH D) 500-200 MG-UNIT tablet Take 1 tablet by mouth 2 (two) times daily.  . cefTRIAXone (ROCEPHIN) IVPB Inject 1 g into the vein daily. Reconstitute with Lidocane  . donepezil (ARICEPT) 5  MG tablet Take 5 mg by mouth at bedtime.  . feeding supplement, ENSURE ENLIVE, (ENSURE ENLIVE) LIQD Take 237 mLs by mouth 3 (three) times daily between meals.  . hydrALAZINE (APRESOLINE) 25 MG tablet Take 1 tablet (25 mg total) by mouth every 8 (eight) hours.  Marland Kitchen losartan (COZAAR) 25 MG tablet Take 1 tablet (25 mg total) by mouth daily.  . mirtazapine (REMERON) 15 MG tablet Take 15 mg by mouth at bedtime.  . Multiple Vitamin (MULTIVITAMIN) tablet Take 1  tablet by mouth daily.  . ondansetron (ZOFRAN) 4 MG tablet Take 4 mg by mouth as needed for nausea.  . promethazine (PHENERGAN) 25 MG tablet Take 25 mg by mouth every 6 (six) hours as needed for nausea or vomiting.  . sertraline (ZOLOFT) 25 MG tablet Take 25 mg by mouth daily.   No facility-administered encounter medications on file as of 12/30/2017.    No Known Allergies Patient Active Problem List   Diagnosis Date Noted  . Acute diastolic (congestive) heart failure ([place]) 12/05/2017  . Weakness 12/04/2017  . Non-intractable vomiting with nausea 12/04/2017  . Acute cystitis without hematuria 12/04/2017  . Elevated blood pressure reading 12/04/2017  . Nausea & vomiting 12/04/2017  . Chronic diastolic CHF (congestive heart failure) ([place]) 12/04/2017  . Malnutrition of moderate degree 12/04/2017  . Chest wall pain 04/03/2017  . Shingles 03/12/2017  . Healthcare maintenance 12/18/2016  . Advance care planning 12/18/2016  . Multiple pelvic fractures 07/02/2013  . Urinary incontinence 09/09/2011  . Osteoporosis 10/17/2009  . Primary hyperparathyroidism ([place]) 10/02/2009  . Vitamin D deficiency 01/06/2009  . CROHN'S DISEASE, SMALL INTESTINE 01/04/2009  . Bladder prolapse, female, acquired 01/04/2009   Social History   Socioeconomic History  . Marital status: Widowed    Spouse name: Not on file  . Number of children: 3  . Years of education: Not on file  . Highest education level: Not on file  Occupational History  . Occupation: Retired  Scientific laboratory technician  . Financial resource strain: Not on file  . Food insecurity:    Worry: Not on file    Inability: Not on file  . Transportation needs:    Medical: Not on file    Non-medical: Not on file  Tobacco Use  . Smoking status: Never Smoker  . Smokeless tobacco: Never Used  Substance and Sexual Activity  . Alcohol use: Yes    Alcohol/week: 0.0 oz    Comment: occ wine  . Drug use: No  . Sexual activity: Never    Birth  control/protection: Coitus interruptus    Comment: because of prolapse and pessary  Lifestyle  . Physical activity:    Days per week: Not on file    Minutes per session: Not on file  . Stress: Not on file  Relationships  . Social connections:    Talks on phone: Not on file    Gets together: Not on file    Attends religious service: Not on file    Active member of club or organization: Not on file    Attends meetings of clubs or organizations: Not on file    Relationship status: Not on file  . Intimate partner violence:    Fear of current or ex partner: Not on file    Emotionally abused: Not on file    Physically abused: Not on file    Forced sexual activity: Not on file  Other Topics Concern  . Not on file  Social History Narrative   Widowed.  Lives alone.     3 kids.     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    Ms. Joaquin family history includes Breast cancer in her mother; Colon cancer in her father; Heart disease in her father; Osteoporosis in her mother; Prostate cancer in her brother.      Objective:    Vitals:   12/30/17 1120  BP: 130/72  Pulse: 76    Physical Exam; well-developed elderly white female who comes in in a wheelchair, holding her head, looking down during most of the visit complaining of some neck pain and She is accompanied by a son and daughter.  Blood pressure 130/72 pulse 76, height 5 foot 1, weight 113 BMI 21.  HEENT; nontraumatic normocephalic EOMI PERRLA sclera anicteric, oropharynx clear, Cardiovascular; regular rate and rhythm with S1-S2 she has a soft systolic murmur, Pulmonary ;clear bilaterally, Abdomen ;soft, nondistended bowel sounds are present no focal tenderness today no guarding or rebound no palpable mass or hepatosplenomegaly.  Rectal ;exam not done, Extremities; thin, no clubbing cyanosis or edema, Neuro psych; patient is awake, alert oriented and participates in the  conversation though she allows her children to do most of the talking.,  Grossly nonfocal       Assessment & Plan:   #12 84 year old white female with history of Crohn's ileitis and recent hospitalization with nausea vomiting and abdominal pain. She had a urinary tract infection/E. coli which has been treated, and was found on CT scan to have active Crohn's of the distal small bowel, an area of stricturing without obstruction. Patient had been noncompliant with medications in the past. She is currently on budesonide 9 mg p.o. daily but continues to have regular episodes of nausea and vomiting and seems to be failing to thrive or improved since discharge from the hospital.  Patient is poor historian today, and patient's children seem to be in disagreement about multiple issues.  Rule out low-grade partial obstruction secondary to active Crohn's of the distal small bowel Patient had a fever about 4 days ago to 101-rule out recurrent UTI  #2 possible dementia and depression #3 congestive heart failure  Plan; CBC with differential today, sed rate, C met, will repeat UA with reflex culture Continue budesonide 9 mg p.o. daily.  I told the family I would discuss escalating to prednisone with Dr. Carlean Purl that she does not seem to be improving. Family asked about mercaptopurine, she was not to be restarted on that at present. Advised she be switched to a low residue diet, bland and orders were placed for [employer] or boost twice daily between meals Family was advised to make a very soon follow-up with her PCP Elsie Stain to discuss her longer-term needs after she is discharged from rehab.  She does not appear to be able to take care of herself at present. There is obvious family discord here, I will have a discussion with Dr. Carlean Purl regarding multiple questions and requests from the son today. Plan office follow-up in about 3 weeks.  I spent an hour in the room today.  Amy Genia Harold  PA-C 12/30/2017   Cc: Tonia Ghent, MD

## 2017-12-31 DIAGNOSIS — R509 Fever, unspecified: Secondary | ICD-10-CM | POA: Diagnosis not present

## 2017-12-31 DIAGNOSIS — N390 Urinary tract infection, site not specified: Secondary | ICD-10-CM | POA: Diagnosis not present

## 2017-12-31 DIAGNOSIS — K5010 Crohn's disease of large intestine without complications: Secondary | ICD-10-CM | POA: Diagnosis not present

## 2017-12-31 DIAGNOSIS — E440 Moderate protein-calorie malnutrition: Secondary | ICD-10-CM | POA: Diagnosis not present

## 2017-12-31 DIAGNOSIS — I10 Essential (primary) hypertension: Secondary | ICD-10-CM | POA: Diagnosis not present

## 2018-01-01 ENCOUNTER — Telehealth: Payer: Self-pay | Admitting: Physician Assistant

## 2018-01-01 ENCOUNTER — Telehealth: Payer: Self-pay | Admitting: Internal Medicine

## 2018-01-01 NOTE — Telephone Encounter (Signed)
Advised of the results. Talked with Nicoletta Ba, PA-C. Results of labs and imaging forwarded to primary GI Dr Carlean Purl.  Also [place], [employer] given orders to give Fleets enema x2 1 hour apart and to assist the patient to the bathroom to have her bowel movement.

## 2018-01-01 NOTE — Telephone Encounter (Signed)
Left message with stan that I was trying to reach him and would try again tomorrow re: his mom

## 2018-01-02 MED ORDER — MIRTAZAPINE 7.5 MG PO TABS: 7.5000 mg | ORAL_TABLET | Freq: Every day | ORAL | Status: DC

## 2018-01-02 NOTE — Telephone Encounter (Signed)
Appt changed, Order given to [facility] LPN over the phone to decrease Remeron to 7.60m q hs.

## 2018-01-02 NOTE — Telephone Encounter (Signed)
I spoke to [name] - her son ' Reviewed things  Her ESR is high but I do not think she has major Crohn's problems  I think UTI's related to bladder prolapse and procidentia  Will leave her on budesonide  She sees me 8/19 (PLEASE CHANGE THAT APPT TO A F/U - currently listed as [facility] appt  [name] is fatigued and tired  She is on Remeron which may be part of that so I have recommended that be reduced to 7.5 mg qhs  NEED TO CALL [facility] AND GIVE THEM AN ORDER FROM ME TO REDUCE MIRTAZAPINE (REMERON) TO 7.5 MG QHS

## 2018-01-05 DIAGNOSIS — R630 Anorexia: Secondary | ICD-10-CM | POA: Diagnosis not present

## 2018-01-05 DIAGNOSIS — R21 Rash and other nonspecific skin eruption: Secondary | ICD-10-CM | POA: Diagnosis not present

## 2018-01-05 DIAGNOSIS — F0391 Unspecified dementia with behavioral disturbance: Secondary | ICD-10-CM | POA: Diagnosis not present

## 2018-01-05 DIAGNOSIS — R69 Illness, unspecified: Secondary | ICD-10-CM | POA: Diagnosis not present

## 2018-01-07 DIAGNOSIS — K5010 Crohn's disease of large intestine without complications: Secondary | ICD-10-CM | POA: Diagnosis not present

## 2018-01-07 DIAGNOSIS — E440 Moderate protein-calorie malnutrition: Secondary | ICD-10-CM | POA: Diagnosis not present

## 2018-01-07 DIAGNOSIS — R5381 Other malaise: Secondary | ICD-10-CM | POA: Diagnosis not present

## 2018-01-07 DIAGNOSIS — I10 Essential (primary) hypertension: Secondary | ICD-10-CM | POA: Diagnosis not present

## 2018-01-14 ENCOUNTER — Telehealth: Payer: Self-pay

## 2018-01-14 NOTE — Telephone Encounter (Signed)
Appointment made for 01/19/18 and son Harolyn Rutherford, RMA

## 2018-01-14 NOTE — Telephone Encounter (Signed)
Copied from [place] [id number]. Topic: Appointment Scheduling - Scheduling Inquiry for Clinic >> Jan 14, 2018  1:22 PM [name] wrote: Reason for CRM:pt was discharged from [facility] rehab on 01-11-18 . Pt needs follow up on med. Pt son is requesting post hosp and rehab appt asap . Per son said pt is not doing well sleeping a lot and fatigue. Dr Damita Dunnings is PCP  No 30 minute appointments available but there are some same day appointments near a 15 minute appointment. Please review-[name], RMA

## 2018-01-14 NOTE — Telephone Encounter (Signed)
She needs a 30-minute appointment at minimum.  Please schedule her when enough time is available.  Thanks.

## 2018-01-15 DIAGNOSIS — K5010 Crohn's disease of large intestine without complications: Secondary | ICD-10-CM | POA: Diagnosis not present

## 2018-01-15 DIAGNOSIS — E441 Mild protein-calorie malnutrition: Secondary | ICD-10-CM | POA: Diagnosis not present

## 2018-01-15 DIAGNOSIS — I110 Hypertensive heart disease with heart failure: Secondary | ICD-10-CM | POA: Diagnosis not present

## 2018-01-15 DIAGNOSIS — F329 Major depressive disorder, single episode, unspecified: Secondary | ICD-10-CM | POA: Diagnosis not present

## 2018-01-15 DIAGNOSIS — R630 Anorexia: Secondary | ICD-10-CM | POA: Diagnosis not present

## 2018-01-15 DIAGNOSIS — R69 Illness, unspecified: Secondary | ICD-10-CM | POA: Diagnosis not present

## 2018-01-15 DIAGNOSIS — Z9181 History of falling: Secondary | ICD-10-CM | POA: Diagnosis not present

## 2018-01-15 DIAGNOSIS — Z87440 Personal history of urinary (tract) infections: Secondary | ICD-10-CM | POA: Diagnosis not present

## 2018-01-15 DIAGNOSIS — I5032 Chronic diastolic (congestive) heart failure: Secondary | ICD-10-CM | POA: Diagnosis not present

## 2018-01-15 DIAGNOSIS — G894 Chronic pain syndrome: Secondary | ICD-10-CM | POA: Diagnosis not present

## 2018-01-19 ENCOUNTER — Encounter: Payer: Self-pay | Admitting: Family Medicine

## 2018-01-19 ENCOUNTER — Ambulatory Visit (INDEPENDENT_AMBULATORY_CARE_PROVIDER_SITE_OTHER): Payer: Medicare HMO | Admitting: Family Medicine

## 2018-01-19 VITALS — BP 146/80 | HR 67 | Temp 98.0°F | Ht 61.0 in | Wt 107.5 lb

## 2018-01-19 DIAGNOSIS — Z7189 Other specified counseling: Secondary | ICD-10-CM | POA: Diagnosis not present

## 2018-01-19 DIAGNOSIS — Z9181 History of falling: Secondary | ICD-10-CM

## 2018-01-19 DIAGNOSIS — I5031 Acute diastolic (congestive) heart failure: Secondary | ICD-10-CM

## 2018-01-19 DIAGNOSIS — I5032 Chronic diastolic (congestive) heart failure: Secondary | ICD-10-CM | POA: Diagnosis not present

## 2018-01-19 DIAGNOSIS — E210 Primary hyperparathyroidism: Secondary | ICD-10-CM

## 2018-01-19 NOTE — Progress Notes (Signed)
Here today with her son.  Inpatient course d/w pt.  She was then discharged to [facility].  She possibly fell soon after discharge from [facility].  I asked if she had fallen, since she uses a walker.  Her son initially said that she did.  Then she denied fall and he didn't want this portion of the conversation entered in the chart.  Then per patient and son they both then denied her falling.    Living back at her home currently.  Her son is at home at nights.  She had a caregiver in the day.  She has home instead coming in.   She has fatigue prev.  Son reports she was more energetic after coming home.  She isn't having CP or SOB.  No BLE edema.  Sleeping "fine."  No fevers.  No vomiting.  No diarrhea.  No abd pain.  No blood in stool.  Mood is "Happy."  I asked about health care decision designation in case the patient were incapacitated.  I asked this as I ask adult patients routinely.  I said "if you got so sick that you could not talk for yourself, who would you want to make decisions for you?"  She clearly answered that she would want all 3 of her children designated if she were incapacitated.  She verified this.  She appeared to understand the question and did not hesitate in her answer.  At this point her son appeared to become agitated and spoke rapidly and incessantly.  He kept interrupting me and the patient.  He refused to stop talking.  He kept saying "this is going to mess everything up".  He was asking leading questions to the patient.  I asked him to stop talking.  He refused to stop talking.  I told him to stop talking.  At this point I asked for a witness to join the clinical exam. Beatriz Stallion volunteered.  She entered the room and identified her self.  She heard me asked the same question again to the patient.  The patient again said she would want all 3 of her kids designated if she were incapacitated.  The conversation is time-stamped below.  Advance directive d/w pt.   Would have  all three of her kids designated if patient were incapacitated.  3:02 PM 01/19/18  This was witnessed --------------------- By Beatriz Stallion, CMA 3:07 PM. 01/19/18 .  The patient's son continued to be disruptive and refused to stop talking.  I told him he would either have to sit quietly outside of the room so that we could perform routine patient care or he would have to leave the premises.  He finally got up and left the room after saying "I am not talking anymore.  I am not talking anymore.  I will stop talking."  I reentered the room and Larene Beach remained as a witness.  We talked to the patient about her routine care goals.  See after visit summary.  I did not make any decisions for the patient.  I did not ask any leading questions.  Physical exam was then performed.  PMH and SH reviewed  ROS: Per HPI unless specifically indicated in ROS section   Meds, vitals, and allergies reviewed.   GEN: nad, alert and pleasant in conversation.  She recalls recent events about her home care and rehab at [facility]. HEENT: mucous membranes moist NECK: supple w/o LA CV: rrr. PULM: ctab, no inc wob ABD: soft, +bs EXT:  no edema SKIN: no acute rash

## 2018-01-19 NOTE — Patient Instructions (Signed)
Go to the lab on the way out.  We'll contact you with your lab report.  Please update your children that you would have all three of you kids designated if you were incapacitated.   That should lead into a conversation about your goals and wishes.  If I can help facilitate that conversation, then please let me know.   I would not change your meds at this point.  I will update the GI clinic.   Take care.  Glad to see you.

## 2018-01-20 ENCOUNTER — Telehealth: Payer: Self-pay | Admitting: Family Medicine

## 2018-01-20 DIAGNOSIS — Z9181 History of falling: Secondary | ICD-10-CM | POA: Insufficient documentation

## 2018-01-20 LAB — COMPREHENSIVE METABOLIC PANEL
ALK PHOS: 71 U/L (ref 39–117)
ALT: 14 U/L (ref 0–35)
AST: 17 U/L (ref 0–37)
Albumin: 4 g/dL (ref 3.5–5.2)
BILIRUBIN TOTAL: 0.7 mg/dL (ref 0.2–1.2)
BUN: 23 mg/dL (ref 6–23)
CO2: 29 mEq/L (ref 19–32)
CREATININE: 0.89 mg/dL (ref 0.40–1.20)
Calcium: 10.3 mg/dL (ref 8.4–10.5)
Chloride: 104 mEq/L (ref 96–112)
GFR: 64.43 mL/min (ref >60.00)
Glucose, Bld: 89 mg/dL (ref 70–99)
Potassium: 4.1 mEq/L (ref 3.5–5.1)
Sodium: 139 mEq/L (ref 135–145)
TOTAL PROTEIN: 7.8 g/dL (ref 6.0–8.3)

## 2018-01-20 LAB — CBC WITH DIFFERENTIAL/PLATELET
BASOS ABS: 0 10*3/uL (ref 0.0–0.1)
Basophils Relative: 0.5 % (ref 0.0–3.0)
EOS ABS: 0.3 10*3/uL (ref 0.0–0.7)
Eosinophils Relative: 3.5 % (ref 0.0–5.0)
HCT: 41.9 % (ref 36.0–46.0)
Hemoglobin: 14.3 g/dL (ref 12.0–15.0)
Lymphocytes Relative: 9.9 % — ABNORMAL LOW (ref 12.0–46.0)
Lymphs Abs: 0.7 10*3/uL (ref 0.7–4.0)
MCHC: 34.2 g/dL (ref 30.0–36.0)
MCV: 92.9 fl (ref 78.0–100.0)
Monocytes Absolute: 0.5 10*3/uL (ref 0.1–1.0)
Monocytes Relative: 7.4 % (ref 3.0–12.0)
Neutro Abs: 5.7 10*3/uL (ref 1.4–7.7)
Neutrophils Relative %: 78.7 % — ABNORMAL HIGH (ref 43.0–77.0)
Platelets: 345 10*3/uL (ref 150.0–400.0)
RBC: 4.51 Mil/uL (ref 3.87–5.11)
RDW: 13 % (ref 11.5–15.5)
WBC: 7.3 10*3/uL (ref 4.0–10.5)

## 2018-01-20 LAB — PTH, INTACT AND CALCIUM
CALCIUM: 10.1 mg/dL (ref 8.6–10.4)
PTH: 38 pg/mL (ref 14–64)

## 2018-01-20 NOTE — Assessment & Plan Note (Addendum)
I asked about health care decision designation in case the patient were incapacitated.  I asked this as I ask adult patients routinely.  I said "if you got so sick that you could not talk for yourself, who would you want to make decisions for you?"  She clearly answered that she would want all 3 of her children designated if she were incapacitated.  She verified this.  She appeared to understand the question and did not hesitate in her answer.  At this point her son appeared to become agitated and spoke rapidly and incessantly.  He kept interrupting me and the patient.  He refused to stop talking.  He kept saying "this is going to mess everything up".  He was asking leading questions to the patient.  I asked him to stop talking.  He refused to stop talking.  I told him to stop talking.  At this point I asked for a witness to join the clinical exam. Beatriz Stallion volunteered.  She entered the room and identified her self.  She heard me asked the same question again to the patient.  The patient again said she would want all 3 of her kids designated if she were incapacitated.  The conversation is time-stamped below.  Advance directive d/w pt.   Would have all three of her kids designated if patient were incapacitated.  3:02 PM 01/19/18  This was witnessed --------------------- By Beatriz Stallion, CMA 3:07 PM. 01/19/18 .  The patient's son continued to be disruptive and refused to stop talking.  I told him he would either have to sit quietly outside of the room so that we could perform routine patient care or he would have to leave the premises.  He finally got up and left the room after saying "I am not talking anymore.  I am not talking anymore.  I will stop talking."  After the fact that I given a copy of the patient's general [place].  It does not specifically state that it is a healthcare power of attorney.  Either way, the patient gave a witnessed and time stamped statement clearly  stating that she would want her 3 children designated for healthcare decisions if she were incapacitated.

## 2018-01-20 NOTE — Assessment & Plan Note (Signed)
See above.  I cannot document whether or not she has fallen.  It is possible that she has fallen w/o apparent injury.  She is using a walker.  Routine cautions discussed with patient.

## 2018-01-20 NOTE — Telephone Encounter (Signed)
Mandy - The patient's son was disruptive at the clinic yesterday.  He is not invited back to the clinic in the future.  Please make sure he is aware that by whatever means necessary.    Please notify the patient's two daughters that the patient designated all 3 of her children for healthcare decisions if the patient becomes incapacitated.  Please notify them of the above.  After the fact that I given a copy of the patient's general [place].  It does not specifically state that it is a healthcare power of attorney.  Either way, the patient gave a witnessed and time stamped statement clearly stating that she would want her 3 children designated for healthcare decisions if she were incapacitated.  She appeared to understand the question at the time and appeared to be able to make an informed choice.  Designation of her 3 children for healthcare decisions may not affect any other non-healthcare/general DURABLE POWER OF ATTORNEY papers.   I asked the patient to notify her children of her wishes.  That should lead into a conversation about her goals and wishes. If I can help facilitate that conversation, then please let me know.  However I will not tolerate disruptive behavior from her son.  Thanks.

## 2018-01-20 NOTE — Assessment & Plan Note (Addendum)
Reportedly had diuresis done as inpatient.  Reasonable to recheck labs today.  No edema.  Lungs are clear. No change in meds at this point. >25 minutes spent in face to face time with patient, >50% spent in counselling or coordination of care.

## 2018-01-20 NOTE — Assessment & Plan Note (Signed)
Reasonable recheck.  See notes on labs.

## 2018-01-21 DIAGNOSIS — I110 Hypertensive heart disease with heart failure: Secondary | ICD-10-CM | POA: Diagnosis not present

## 2018-01-21 DIAGNOSIS — E441 Mild protein-calorie malnutrition: Secondary | ICD-10-CM | POA: Diagnosis not present

## 2018-01-21 DIAGNOSIS — K5010 Crohn's disease of large intestine without complications: Secondary | ICD-10-CM | POA: Diagnosis not present

## 2018-01-21 DIAGNOSIS — I5032 Chronic diastolic (congestive) heart failure: Secondary | ICD-10-CM | POA: Diagnosis not present

## 2018-01-21 DIAGNOSIS — G894 Chronic pain syndrome: Secondary | ICD-10-CM | POA: Diagnosis not present

## 2018-01-21 DIAGNOSIS — F329 Major depressive disorder, single episode, unspecified: Secondary | ICD-10-CM | POA: Diagnosis not present

## 2018-01-21 DIAGNOSIS — Z9181 History of falling: Secondary | ICD-10-CM | POA: Diagnosis not present

## 2018-01-21 DIAGNOSIS — R69 Illness, unspecified: Secondary | ICD-10-CM | POA: Diagnosis not present

## 2018-01-21 DIAGNOSIS — R630 Anorexia: Secondary | ICD-10-CM | POA: Diagnosis not present

## 2018-01-21 DIAGNOSIS — Z87440 Personal history of urinary (tract) infections: Secondary | ICD-10-CM | POA: Diagnosis not present

## 2018-01-21 NOTE — Telephone Encounter (Signed)
Thanks for checking on patient.  She has nothing to apologize for.

## 2018-01-21 NOTE — Telephone Encounter (Signed)
Called and spoke with patient regarding her son's behavior at office appointment on 01/19/18.  Patient states that she was surprised by the visit and is not quite sure herself what happened.  She states that her son felt "bullied" by Dr. Damita Dunnings and wants her to transfer to another physician.  She, also, wasn't sure why Dr. Damita Dunnings had asked him to leave the room.    I explained to patient that her son's constant interruption of both her and Dr. Josefine Class conversation along with his frequent talking over both patient and physician led to an unsupportive patient/physician visit.  Furthermore, when Dr. Damita Dunnings tried to calmly redirect her son and get him to stop talking the son became increasing agitated. He continued to talk without stopping to the point that the Dr had to dismiss him from the room in order to complete the visit.  This behavior impeded Dr. Josefine Class ability to adequately examine and consult with her and he did not feel that she was being allowed to express her questions, concerns or wishes. She is not mentally incapacitated and has the right to speak for herself.  Her son disagreed with what she was trying to tell Dr. Damita Dunnings in regards to end of life care and decisions.  Son spoke over her most of the conversation, disagreeing with what both she and Dr. Damita Dunnings were trying to say.    Patient states that her son is very protective but "gentle".  She feels that there were "mis-perceptions" on both sides and she was very sorry that the visit went so poorly.  I explained that she has absolutely nothing to apologize for and that Dr. Damita Dunnings is very happy to continue supporting and providing her care.  He was simply trying to protect her right to speak for herself and the appropriateness and confidentiality of the patient/physician relationship.  Also, I informed her that while he is happy to continue providing her care, we have to ask that her son not return with her to any future appointments as a result of  his behavior.  She is welcome and encouraged, as she sees fit, to bring any other family/friend support back with her to appointments, but her son is not allowed.  She was also informed that we will be sending her son a letter explaining this.  Patient verbalizes understanding, thanks me for my call and further explanation of what occurred and asked me to pass her apologies on to Dr. Damita Dunnings, to which, I again responded is not necessary and she did nothing wrong.  Patient remained kind, calm and pleasant throughout conversation.

## 2018-01-22 ENCOUNTER — Telehealth: Payer: Self-pay | Admitting: Family Medicine

## 2018-01-22 ENCOUNTER — Encounter: Payer: Self-pay | Admitting: *Deleted

## 2018-01-22 DIAGNOSIS — I5032 Chronic diastolic (congestive) heart failure: Secondary | ICD-10-CM | POA: Diagnosis not present

## 2018-01-22 DIAGNOSIS — R630 Anorexia: Secondary | ICD-10-CM | POA: Diagnosis not present

## 2018-01-22 DIAGNOSIS — K5010 Crohn's disease of large intestine without complications: Secondary | ICD-10-CM | POA: Diagnosis not present

## 2018-01-22 DIAGNOSIS — G894 Chronic pain syndrome: Secondary | ICD-10-CM | POA: Diagnosis not present

## 2018-01-22 DIAGNOSIS — R69 Illness, unspecified: Secondary | ICD-10-CM | POA: Diagnosis not present

## 2018-01-22 DIAGNOSIS — I110 Hypertensive heart disease with heart failure: Secondary | ICD-10-CM | POA: Diagnosis not present

## 2018-01-22 DIAGNOSIS — F329 Major depressive disorder, single episode, unspecified: Secondary | ICD-10-CM | POA: Diagnosis not present

## 2018-01-22 DIAGNOSIS — E441 Mild protein-calorie malnutrition: Secondary | ICD-10-CM | POA: Diagnosis not present

## 2018-01-22 DIAGNOSIS — Z87440 Personal history of urinary (tract) infections: Secondary | ICD-10-CM | POA: Diagnosis not present

## 2018-01-22 DIAGNOSIS — Z9181 History of falling: Secondary | ICD-10-CM | POA: Diagnosis not present

## 2018-01-22 NOTE — Telephone Encounter (Signed)
Tried to return patient's call, no answer, mailbox is full.

## 2018-01-22 NOTE — Telephone Encounter (Signed)
Copied from [place] [id number]. Topic: Quick Communication - Office Called Patient >> Jan 22, 2018 12:03 PM Neva Seat wrote: Pt returned missed call.  Office left a message.  Pt wants to a call back from Dr. Josefine Class nurse asap.

## 2018-01-23 DIAGNOSIS — K5010 Crohn's disease of large intestine without complications: Secondary | ICD-10-CM | POA: Diagnosis not present

## 2018-01-23 DIAGNOSIS — Z9181 History of falling: Secondary | ICD-10-CM | POA: Diagnosis not present

## 2018-01-23 DIAGNOSIS — R630 Anorexia: Secondary | ICD-10-CM | POA: Diagnosis not present

## 2018-01-23 DIAGNOSIS — I110 Hypertensive heart disease with heart failure: Secondary | ICD-10-CM | POA: Diagnosis not present

## 2018-01-23 DIAGNOSIS — R69 Illness, unspecified: Secondary | ICD-10-CM | POA: Diagnosis not present

## 2018-01-23 DIAGNOSIS — E441 Mild protein-calorie malnutrition: Secondary | ICD-10-CM | POA: Diagnosis not present

## 2018-01-23 DIAGNOSIS — Z87440 Personal history of urinary (tract) infections: Secondary | ICD-10-CM | POA: Diagnosis not present

## 2018-01-23 DIAGNOSIS — G894 Chronic pain syndrome: Secondary | ICD-10-CM | POA: Diagnosis not present

## 2018-01-23 DIAGNOSIS — F329 Major depressive disorder, single episode, unspecified: Secondary | ICD-10-CM | POA: Diagnosis not present

## 2018-01-23 DIAGNOSIS — I5032 Chronic diastolic (congestive) heart failure: Secondary | ICD-10-CM | POA: Diagnosis not present

## 2018-01-23 NOTE — Telephone Encounter (Signed)
Tried to return patient's call, no answer, mailbox is full.

## 2018-01-23 NOTE — Telephone Encounter (Signed)
patient called and stats she missed a call CB# [phone]

## 2018-01-23 NOTE — Telephone Encounter (Signed)
Patient's son sent an expensive fruit arrangement to office on 01/22/18 "thanking for the care of his mother".    Due to [place] professional policy regarding "gifts", Dr. Damita Dunnings was unable to accept this.

## 2018-01-23 NOTE — Telephone Encounter (Signed)
Patient advised about lab results

## 2018-01-26 ENCOUNTER — Encounter: Payer: Self-pay | Admitting: Internal Medicine

## 2018-01-26 ENCOUNTER — Ambulatory Visit: Payer: Medicare HMO | Admitting: Internal Medicine

## 2018-01-26 VITALS — BP 118/72 | HR 74 | Ht 61.0 in | Wt 107.5 lb

## 2018-01-26 DIAGNOSIS — K5000 Crohn's disease of small intestine without complications: Secondary | ICD-10-CM | POA: Diagnosis not present

## 2018-01-26 DIAGNOSIS — Z639 Problem related to primary support group, unspecified: Secondary | ICD-10-CM

## 2018-01-26 DIAGNOSIS — R69 Illness, unspecified: Secondary | ICD-10-CM | POA: Diagnosis not present

## 2018-01-26 DIAGNOSIS — R531 Weakness: Secondary | ICD-10-CM

## 2018-01-26 NOTE — Progress Notes (Signed)
[name] 84 y.o. April 14, 1933 [id number]  Assessment & Plan:   Encounter Diagnoses  Name Primary?  . Crohn's disease of small intestine without complication ([place]) Yes  . Weakness - improved   . Family dynamics problem      Prior impaction or colon full of stool probably related to immobility in my opinion. Observe for recurrence.  Continue Entocort at this time most likely.  Her Crohn's is relatively asymptomatic.  I am not sure it has anything to do with what led to her recent hospitalization. I will call her daughter to discuss the medications and be sure where she is. She has requested a new PCP.  I think she has a good one but once again interactions of her son and healthcare providers have led to her request for change.  A big part of her problems I think is her unwillingness to accept her illnesses and her level of function and debility.  I discussed the problems that stand has interacting with healthcare personnel and how he gets upset and argumentative which I have witnessed myself.  I told her I think he cares about her but the way he comes across can be threatening to others.  I do not think she accepts that.  She will see me again in a few months October 9. I appreciate the opportunity to care for this patient.   Subjective:   Chief Complaint: Follow-up of Crohn's disease  HPI The patient is here with her daughter Elmo Putt, reporting that she feels okay with respect to her GI tract.  She is a little weak but she is happy to be home and she is improving.  She feels like she has been "over managed by my children".  Her son [name] was asked to leave the room with Dr. Damita Dunnings and Ms. Pankratz is upset about that and would like to have a new primary care provider.  [name] was also asked to leave the hospital floor when she was hospitalized.  When she was at Dr. Josefine Class since she was fully competent Dr. Damita Dunnings was trying to have a conversation with her about what she wanted to do  with respect to [place] and also who she would want to make decisions for her and she indicated per his note that she wanted all 3 children to help make decisions.  She also does not think she needed to be hospitalized in June when she was.  She now has a man who is a friend of her son stand or at least an acquaintance, living in the house in the apartment over her garage she was cooking for her and looking out for her.  I think she is ambivalent over this and she still thinks she may be able to live alone.  She says she is taking her medicines but neither she nor Elmo Putt know exactly what she is taking. Wt Readings from Last 3 Encounters:  01/26/18 107 lb 8 oz (48.8 kg)  01/19/18 107 lb 8 oz (48.8 kg)  12/30/17 113 lb 8 oz (51.5 kg)    No Known Allergies Current Meds  Medication Sig  . budesonide (ENTOCORT EC) 3 MG 24 hr capsule Take 3 capsules (9 mg total) by mouth daily.  . calcium-vitamin D (OSCAL WITH D) 500-200 MG-UNIT tablet Take 1 tablet by mouth 2 (two) times daily.  Marland Kitchen donepezil (ARICEPT) 5 MG tablet Take 5 mg by mouth at bedtime.  . feeding supplement, ENSURE ENLIVE, (ENSURE ENLIVE) LIQD Take 237  mLs by mouth 3 (three) times daily between meals.  . hydrALAZINE (APRESOLINE) 25 MG tablet Take 1 tablet (25 mg total) by mouth every 8 (eight) hours.  Marland Kitchen losartan (COZAAR) 25 MG tablet Take 1 tablet (25 mg total) by mouth daily.  . mirtazapine (REMERON) 7.5 MG tablet Take 1 tablet (7.5 mg total) by mouth at bedtime.  . Multiple Vitamin (MULTIVITAMIN) tablet Take 1 tablet by mouth daily.  . sertraline (ZOLOFT) 25 MG tablet Take 25 mg by mouth daily.   Past Medical History:  Diagnosis Date  . Acute diastolic (congestive) heart failure ([place])   . Bladder prolapse, female, acquired    pessary in  . Chronic diastolic (congestive) heart failure ([place])   . Chronic pain syndrome   . Complete uterine prolapse   . Crohn's disease ([place]) 2010  . Diverticulosis   . E. coli UTI 06/09/2013  .  Fall 05/2013  . Hemorrhoids   . History of hypercalcemia   . History of hyperglycemia   . Hydronephrosis   . Hyperparathyroidism   . Hypertension   . Multiple pelvic fractures 07/02/2013  . Osteoporosis   . Pubic ramus fracture ([place])   . Urinary incontinence 09/09/2011  . Vitamin D deficiency    Past Surgical History:  Procedure Laterality Date  . COLONOSCOPY  12/23/2008   crohn's, diverticulosis, internal hemorrhoids  . HIP FRACTURE SURGERY Left    left, prosthesis  . LAPAROSCOPY  10/2009  . PARATHYROIDECTOMY Left 08/30/2013   Procedure: LEFT INFERIOR PARATHYROIDECTOMY;  Surgeon: Earnstine Regal, MD;  Location: [facility];  Service: General;  Laterality: Left;  . RHINOPLASTY    . TONSILLECTOMY     Social History   Social History Narrative   Widowed.  Lives alone.     3 kids.     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   family history includes Breast cancer in her mother; Colon cancer in her father; Heart disease in her father; Osteoporosis in her mother; Prostate cancer in her brother.   Review of Systems As per HPI  Objective:   Physical Exam BP 118/72   Pulse 74   Ht 5' 1"  (1.549 m)   Wt 107 lb 8 oz (48.8 kg)   BMI 20.31 kg/m   Asthenic moving slow needing assistance when she walks and gets on the exam table Eyes anicteric Lungs clear Normal heart sounds Abdomen is soft and nontender She answers questions appropriately mental status not formally tested  25 minutes time spent with patient > half in counseling coordination of care

## 2018-01-26 NOTE — Patient Instructions (Signed)
  Dr Carlean Purl will see you October 9th at 1:30, arrive at 1:15 pm.    I appreciate the opportunity to care for you. Silvano Rusk, MD, [facility]

## 2018-01-26 NOTE — Assessment & Plan Note (Signed)
OK 

## 2018-01-28 DIAGNOSIS — Z87440 Personal history of urinary (tract) infections: Secondary | ICD-10-CM | POA: Diagnosis not present

## 2018-01-28 DIAGNOSIS — R630 Anorexia: Secondary | ICD-10-CM | POA: Diagnosis not present

## 2018-01-28 DIAGNOSIS — I110 Hypertensive heart disease with heart failure: Secondary | ICD-10-CM | POA: Diagnosis not present

## 2018-01-28 DIAGNOSIS — Z9181 History of falling: Secondary | ICD-10-CM | POA: Diagnosis not present

## 2018-01-28 DIAGNOSIS — R69 Illness, unspecified: Secondary | ICD-10-CM | POA: Diagnosis not present

## 2018-01-28 DIAGNOSIS — K5010 Crohn's disease of large intestine without complications: Secondary | ICD-10-CM | POA: Diagnosis not present

## 2018-01-28 DIAGNOSIS — F329 Major depressive disorder, single episode, unspecified: Secondary | ICD-10-CM | POA: Diagnosis not present

## 2018-01-28 DIAGNOSIS — G894 Chronic pain syndrome: Secondary | ICD-10-CM | POA: Diagnosis not present

## 2018-01-28 DIAGNOSIS — E441 Mild protein-calorie malnutrition: Secondary | ICD-10-CM | POA: Diagnosis not present

## 2018-01-28 DIAGNOSIS — I5032 Chronic diastolic (congestive) heart failure: Secondary | ICD-10-CM | POA: Diagnosis not present

## 2018-01-29 DIAGNOSIS — I5032 Chronic diastolic (congestive) heart failure: Secondary | ICD-10-CM | POA: Diagnosis not present

## 2018-01-29 DIAGNOSIS — R69 Illness, unspecified: Secondary | ICD-10-CM | POA: Diagnosis not present

## 2018-01-29 DIAGNOSIS — Z87440 Personal history of urinary (tract) infections: Secondary | ICD-10-CM | POA: Diagnosis not present

## 2018-01-29 DIAGNOSIS — R630 Anorexia: Secondary | ICD-10-CM | POA: Diagnosis not present

## 2018-01-29 DIAGNOSIS — Z9181 History of falling: Secondary | ICD-10-CM | POA: Diagnosis not present

## 2018-01-29 DIAGNOSIS — G894 Chronic pain syndrome: Secondary | ICD-10-CM | POA: Diagnosis not present

## 2018-01-29 DIAGNOSIS — E441 Mild protein-calorie malnutrition: Secondary | ICD-10-CM | POA: Diagnosis not present

## 2018-01-29 DIAGNOSIS — I110 Hypertensive heart disease with heart failure: Secondary | ICD-10-CM | POA: Diagnosis not present

## 2018-01-29 DIAGNOSIS — K5010 Crohn's disease of large intestine without complications: Secondary | ICD-10-CM | POA: Diagnosis not present

## 2018-01-29 DIAGNOSIS — F329 Major depressive disorder, single episode, unspecified: Secondary | ICD-10-CM | POA: Diagnosis not present

## 2018-02-04 DIAGNOSIS — I110 Hypertensive heart disease with heart failure: Secondary | ICD-10-CM | POA: Diagnosis not present

## 2018-02-04 DIAGNOSIS — I5032 Chronic diastolic (congestive) heart failure: Secondary | ICD-10-CM | POA: Diagnosis not present

## 2018-02-04 DIAGNOSIS — Z87440 Personal history of urinary (tract) infections: Secondary | ICD-10-CM | POA: Diagnosis not present

## 2018-02-04 DIAGNOSIS — K5010 Crohn's disease of large intestine without complications: Secondary | ICD-10-CM | POA: Diagnosis not present

## 2018-02-04 DIAGNOSIS — R69 Illness, unspecified: Secondary | ICD-10-CM | POA: Diagnosis not present

## 2018-02-04 DIAGNOSIS — R630 Anorexia: Secondary | ICD-10-CM | POA: Diagnosis not present

## 2018-02-04 DIAGNOSIS — Z9181 History of falling: Secondary | ICD-10-CM | POA: Diagnosis not present

## 2018-02-04 DIAGNOSIS — E441 Mild protein-calorie malnutrition: Secondary | ICD-10-CM | POA: Diagnosis not present

## 2018-02-04 DIAGNOSIS — G894 Chronic pain syndrome: Secondary | ICD-10-CM | POA: Diagnosis not present

## 2018-02-04 DIAGNOSIS — F329 Major depressive disorder, single episode, unspecified: Secondary | ICD-10-CM | POA: Diagnosis not present

## 2018-02-05 DIAGNOSIS — E441 Mild protein-calorie malnutrition: Secondary | ICD-10-CM | POA: Diagnosis not present

## 2018-02-05 DIAGNOSIS — R630 Anorexia: Secondary | ICD-10-CM | POA: Diagnosis not present

## 2018-02-05 DIAGNOSIS — R69 Illness, unspecified: Secondary | ICD-10-CM | POA: Diagnosis not present

## 2018-02-05 DIAGNOSIS — F329 Major depressive disorder, single episode, unspecified: Secondary | ICD-10-CM | POA: Diagnosis not present

## 2018-02-05 DIAGNOSIS — I110 Hypertensive heart disease with heart failure: Secondary | ICD-10-CM | POA: Diagnosis not present

## 2018-02-05 DIAGNOSIS — I5032 Chronic diastolic (congestive) heart failure: Secondary | ICD-10-CM | POA: Diagnosis not present

## 2018-02-05 DIAGNOSIS — K5010 Crohn's disease of large intestine without complications: Secondary | ICD-10-CM | POA: Diagnosis not present

## 2018-02-05 DIAGNOSIS — G894 Chronic pain syndrome: Secondary | ICD-10-CM | POA: Diagnosis not present

## 2018-02-05 DIAGNOSIS — Z9181 History of falling: Secondary | ICD-10-CM | POA: Diagnosis not present

## 2018-02-05 DIAGNOSIS — Z87440 Personal history of urinary (tract) infections: Secondary | ICD-10-CM | POA: Diagnosis not present

## 2018-02-11 DIAGNOSIS — E441 Mild protein-calorie malnutrition: Secondary | ICD-10-CM | POA: Diagnosis not present

## 2018-02-11 DIAGNOSIS — Z9181 History of falling: Secondary | ICD-10-CM | POA: Diagnosis not present

## 2018-02-11 DIAGNOSIS — K5010 Crohn's disease of large intestine without complications: Secondary | ICD-10-CM | POA: Diagnosis not present

## 2018-02-11 DIAGNOSIS — R630 Anorexia: Secondary | ICD-10-CM | POA: Diagnosis not present

## 2018-02-11 DIAGNOSIS — I5032 Chronic diastolic (congestive) heart failure: Secondary | ICD-10-CM | POA: Diagnosis not present

## 2018-02-11 DIAGNOSIS — F329 Major depressive disorder, single episode, unspecified: Secondary | ICD-10-CM | POA: Diagnosis not present

## 2018-02-11 DIAGNOSIS — G894 Chronic pain syndrome: Secondary | ICD-10-CM | POA: Diagnosis not present

## 2018-02-11 DIAGNOSIS — I110 Hypertensive heart disease with heart failure: Secondary | ICD-10-CM | POA: Diagnosis not present

## 2018-02-11 DIAGNOSIS — R69 Illness, unspecified: Secondary | ICD-10-CM | POA: Diagnosis not present

## 2018-02-11 DIAGNOSIS — Z87440 Personal history of urinary (tract) infections: Secondary | ICD-10-CM | POA: Diagnosis not present

## 2018-02-12 ENCOUNTER — Telehealth: Payer: Self-pay

## 2018-02-12 NOTE — Telephone Encounter (Signed)
Patient with multiple days of constipation.  She has tried OTC products with no relief.  I spoke with the patient herself.  She is not sure what she drank as a laxative last night, "I just know it did not work.  Dr. Carlean Purl please advise on constipation regimen,

## 2018-02-12 NOTE — Telephone Encounter (Signed)
If she has home health we need to know that # and can do a tap water enema x 2   She needs to take 1 dose of MiraLAx daily and if no BM after 2 days tale 1-2 dulcolax prn  on an ongoing basis trying to have a BM at least every 2 days

## 2018-02-13 ENCOUNTER — Other Ambulatory Visit: Payer: Self-pay | Admitting: *Deleted

## 2018-02-13 MED ORDER — DONEPEZIL HCL 5 MG PO TABS: 5.0000 mg | ORAL_TABLET | Freq: Every day | ORAL | 0 refills | Status: DC

## 2018-02-13 MED ORDER — BUDESONIDE 3 MG PO CPEP
9.0000 mg | ORAL_CAPSULE | Freq: Every day | ORAL | 0 refills | Status: DC
Start: 2018-02-13 — End: 2018-04-15

## 2018-02-13 MED ORDER — SERTRALINE HCL 25 MG PO TABS: 25.0000 mg | ORAL_TABLET | Freq: Every day | ORAL | 0 refills | Status: DC

## 2018-02-13 MED ORDER — LOSARTAN POTASSIUM 25 MG PO TABS
25.0000 mg | ORAL_TABLET | Freq: Every day | ORAL | 0 refills | Status: DC
Start: 2018-02-13 — End: 2018-04-15

## 2018-02-13 MED ORDER — HYDRALAZINE HCL 25 MG PO TABS: 25.0000 mg | ORAL_TABLET | Freq: Three times a day (TID) | ORAL | 0 refills | Status: DC

## 2018-02-13 NOTE — Telephone Encounter (Signed)
Faxed refill request. Mirtazapine Last office visit:   01/19/18 Last Filled:   01/02/2018 by Silvano Rusk, MD Please advise.

## 2018-02-13 NOTE — Telephone Encounter (Signed)
Patient's son [name] notified.  He reports that she had a large BM today.  I relayed instructions to her son [name].  He verbalized understanding.

## 2018-02-15 MED ORDER — MIRTAZAPINE 7.5 MG PO TABS: 7.5000 mg | ORAL_TABLET | Freq: Every day | ORAL | 0 refills | Status: DC

## 2018-02-15 NOTE — Telephone Encounter (Signed)
Sent. Per GI notes on 01/26/18, she has requested another doc.  Please verify.  If this is the case, this okay to start discharge proceedings.  Thanks.

## 2018-02-16 DIAGNOSIS — F329 Major depressive disorder, single episode, unspecified: Secondary | ICD-10-CM | POA: Diagnosis not present

## 2018-02-16 DIAGNOSIS — G894 Chronic pain syndrome: Secondary | ICD-10-CM | POA: Diagnosis not present

## 2018-02-16 DIAGNOSIS — I5032 Chronic diastolic (congestive) heart failure: Secondary | ICD-10-CM | POA: Diagnosis not present

## 2018-02-16 DIAGNOSIS — I110 Hypertensive heart disease with heart failure: Secondary | ICD-10-CM | POA: Diagnosis not present

## 2018-02-16 DIAGNOSIS — R69 Illness, unspecified: Secondary | ICD-10-CM | POA: Diagnosis not present

## 2018-02-16 DIAGNOSIS — Z9181 History of falling: Secondary | ICD-10-CM | POA: Diagnosis not present

## 2018-02-16 DIAGNOSIS — E441 Mild protein-calorie malnutrition: Secondary | ICD-10-CM | POA: Diagnosis not present

## 2018-02-16 DIAGNOSIS — R630 Anorexia: Secondary | ICD-10-CM | POA: Diagnosis not present

## 2018-02-16 DIAGNOSIS — K5010 Crohn's disease of large intestine without complications: Secondary | ICD-10-CM | POA: Diagnosis not present

## 2018-02-16 DIAGNOSIS — Z87440 Personal history of urinary (tract) infections: Secondary | ICD-10-CM | POA: Diagnosis not present

## 2018-02-18 DIAGNOSIS — K5010 Crohn's disease of large intestine without complications: Secondary | ICD-10-CM | POA: Diagnosis not present

## 2018-02-18 DIAGNOSIS — Z87440 Personal history of urinary (tract) infections: Secondary | ICD-10-CM | POA: Diagnosis not present

## 2018-02-18 DIAGNOSIS — E441 Mild protein-calorie malnutrition: Secondary | ICD-10-CM | POA: Diagnosis not present

## 2018-02-18 DIAGNOSIS — R69 Illness, unspecified: Secondary | ICD-10-CM | POA: Diagnosis not present

## 2018-02-18 DIAGNOSIS — G894 Chronic pain syndrome: Secondary | ICD-10-CM | POA: Diagnosis not present

## 2018-02-18 DIAGNOSIS — Z9181 History of falling: Secondary | ICD-10-CM | POA: Diagnosis not present

## 2018-02-18 DIAGNOSIS — I110 Hypertensive heart disease with heart failure: Secondary | ICD-10-CM | POA: Diagnosis not present

## 2018-02-18 DIAGNOSIS — I5032 Chronic diastolic (congestive) heart failure: Secondary | ICD-10-CM | POA: Diagnosis not present

## 2018-02-18 DIAGNOSIS — R630 Anorexia: Secondary | ICD-10-CM | POA: Diagnosis not present

## 2018-02-18 DIAGNOSIS — F329 Major depressive disorder, single episode, unspecified: Secondary | ICD-10-CM | POA: Diagnosis not present

## 2018-02-23 DIAGNOSIS — I110 Hypertensive heart disease with heart failure: Secondary | ICD-10-CM | POA: Diagnosis not present

## 2018-02-23 DIAGNOSIS — G894 Chronic pain syndrome: Secondary | ICD-10-CM | POA: Diagnosis not present

## 2018-02-23 DIAGNOSIS — R630 Anorexia: Secondary | ICD-10-CM | POA: Diagnosis not present

## 2018-02-23 DIAGNOSIS — K5010 Crohn's disease of large intestine without complications: Secondary | ICD-10-CM | POA: Diagnosis not present

## 2018-02-23 DIAGNOSIS — I5032 Chronic diastolic (congestive) heart failure: Secondary | ICD-10-CM | POA: Diagnosis not present

## 2018-02-23 DIAGNOSIS — R69 Illness, unspecified: Secondary | ICD-10-CM | POA: Diagnosis not present

## 2018-02-23 DIAGNOSIS — Z9181 History of falling: Secondary | ICD-10-CM | POA: Diagnosis not present

## 2018-02-23 DIAGNOSIS — F329 Major depressive disorder, single episode, unspecified: Secondary | ICD-10-CM | POA: Diagnosis not present

## 2018-02-23 DIAGNOSIS — E441 Mild protein-calorie malnutrition: Secondary | ICD-10-CM | POA: Diagnosis not present

## 2018-02-23 DIAGNOSIS — Z87440 Personal history of urinary (tract) infections: Secondary | ICD-10-CM | POA: Diagnosis not present

## 2018-02-25 DIAGNOSIS — F329 Major depressive disorder, single episode, unspecified: Secondary | ICD-10-CM | POA: Diagnosis not present

## 2018-02-25 DIAGNOSIS — K5010 Crohn's disease of large intestine without complications: Secondary | ICD-10-CM | POA: Diagnosis not present

## 2018-02-25 DIAGNOSIS — Z87440 Personal history of urinary (tract) infections: Secondary | ICD-10-CM | POA: Diagnosis not present

## 2018-02-25 DIAGNOSIS — I110 Hypertensive heart disease with heart failure: Secondary | ICD-10-CM | POA: Diagnosis not present

## 2018-02-25 DIAGNOSIS — R69 Illness, unspecified: Secondary | ICD-10-CM | POA: Diagnosis not present

## 2018-02-25 DIAGNOSIS — I5032 Chronic diastolic (congestive) heart failure: Secondary | ICD-10-CM | POA: Diagnosis not present

## 2018-02-25 DIAGNOSIS — Z9181 History of falling: Secondary | ICD-10-CM | POA: Diagnosis not present

## 2018-02-25 DIAGNOSIS — G894 Chronic pain syndrome: Secondary | ICD-10-CM | POA: Diagnosis not present

## 2018-02-25 DIAGNOSIS — R630 Anorexia: Secondary | ICD-10-CM | POA: Diagnosis not present

## 2018-02-25 DIAGNOSIS — E441 Mild protein-calorie malnutrition: Secondary | ICD-10-CM | POA: Diagnosis not present

## 2018-02-26 ENCOUNTER — Telehealth: Payer: Self-pay | Admitting: Family Medicine

## 2018-02-26 NOTE — Telephone Encounter (Signed)
Per GI notes on 01/26/18, she has requested another doc.  Please verify.  If this is the case, this okay to start discharge proceedings.  Thanks.

## 2018-02-26 NOTE — Telephone Encounter (Signed)
Attempted to contact pt; vm full. Should pt return call, pls refer to Dr Josefine Class comments. CRM created

## 2018-03-02 DIAGNOSIS — K5010 Crohn's disease of large intestine without complications: Secondary | ICD-10-CM | POA: Diagnosis not present

## 2018-03-02 DIAGNOSIS — I110 Hypertensive heart disease with heart failure: Secondary | ICD-10-CM | POA: Diagnosis not present

## 2018-03-02 DIAGNOSIS — Z9181 History of falling: Secondary | ICD-10-CM | POA: Diagnosis not present

## 2018-03-02 DIAGNOSIS — I5032 Chronic diastolic (congestive) heart failure: Secondary | ICD-10-CM | POA: Diagnosis not present

## 2018-03-02 DIAGNOSIS — G894 Chronic pain syndrome: Secondary | ICD-10-CM | POA: Diagnosis not present

## 2018-03-02 DIAGNOSIS — R69 Illness, unspecified: Secondary | ICD-10-CM | POA: Diagnosis not present

## 2018-03-02 DIAGNOSIS — F329 Major depressive disorder, single episode, unspecified: Secondary | ICD-10-CM | POA: Diagnosis not present

## 2018-03-02 DIAGNOSIS — E441 Mild protein-calorie malnutrition: Secondary | ICD-10-CM | POA: Diagnosis not present

## 2018-03-02 DIAGNOSIS — Z87440 Personal history of urinary (tract) infections: Secondary | ICD-10-CM | POA: Diagnosis not present

## 2018-03-02 DIAGNOSIS — R630 Anorexia: Secondary | ICD-10-CM | POA: Diagnosis not present

## 2018-03-04 DIAGNOSIS — Z87440 Personal history of urinary (tract) infections: Secondary | ICD-10-CM | POA: Diagnosis not present

## 2018-03-04 DIAGNOSIS — I5032 Chronic diastolic (congestive) heart failure: Secondary | ICD-10-CM | POA: Diagnosis not present

## 2018-03-04 DIAGNOSIS — Z9181 History of falling: Secondary | ICD-10-CM | POA: Diagnosis not present

## 2018-03-04 DIAGNOSIS — R630 Anorexia: Secondary | ICD-10-CM | POA: Diagnosis not present

## 2018-03-04 DIAGNOSIS — E441 Mild protein-calorie malnutrition: Secondary | ICD-10-CM | POA: Diagnosis not present

## 2018-03-04 DIAGNOSIS — G894 Chronic pain syndrome: Secondary | ICD-10-CM | POA: Diagnosis not present

## 2018-03-04 DIAGNOSIS — R69 Illness, unspecified: Secondary | ICD-10-CM | POA: Diagnosis not present

## 2018-03-04 DIAGNOSIS — I110 Hypertensive heart disease with heart failure: Secondary | ICD-10-CM | POA: Diagnosis not present

## 2018-03-04 DIAGNOSIS — F329 Major depressive disorder, single episode, unspecified: Secondary | ICD-10-CM | POA: Diagnosis not present

## 2018-03-04 DIAGNOSIS — K5010 Crohn's disease of large intestine without complications: Secondary | ICD-10-CM | POA: Diagnosis not present

## 2018-03-04 NOTE — Telephone Encounter (Signed)
Please see note from Dr. Damita Dunnings.

## 2018-03-05 DIAGNOSIS — Z9181 History of falling: Secondary | ICD-10-CM | POA: Diagnosis not present

## 2018-03-05 DIAGNOSIS — G894 Chronic pain syndrome: Secondary | ICD-10-CM | POA: Diagnosis not present

## 2018-03-05 DIAGNOSIS — F329 Major depressive disorder, single episode, unspecified: Secondary | ICD-10-CM | POA: Diagnosis not present

## 2018-03-05 DIAGNOSIS — R630 Anorexia: Secondary | ICD-10-CM | POA: Diagnosis not present

## 2018-03-05 DIAGNOSIS — K5010 Crohn's disease of large intestine without complications: Secondary | ICD-10-CM | POA: Diagnosis not present

## 2018-03-05 DIAGNOSIS — Z87440 Personal history of urinary (tract) infections: Secondary | ICD-10-CM | POA: Diagnosis not present

## 2018-03-05 DIAGNOSIS — I110 Hypertensive heart disease with heart failure: Secondary | ICD-10-CM | POA: Diagnosis not present

## 2018-03-05 DIAGNOSIS — E441 Mild protein-calorie malnutrition: Secondary | ICD-10-CM | POA: Diagnosis not present

## 2018-03-05 DIAGNOSIS — I5032 Chronic diastolic (congestive) heart failure: Secondary | ICD-10-CM | POA: Diagnosis not present

## 2018-03-05 DIAGNOSIS — R69 Illness, unspecified: Secondary | ICD-10-CM | POA: Diagnosis not present

## 2018-03-06 NOTE — Telephone Encounter (Addendum)
LM for daughter [name] to please call back to discuss status of patient's current PCP wishes.  We also have home health orders that we need to have clarity on whether we are to sign or is she under the care of another PCP at this time.   I did try to call patient' s phone number first; however, her answering machine was full and no longer taking messages.   Thanks.

## 2018-03-09 DIAGNOSIS — G894 Chronic pain syndrome: Secondary | ICD-10-CM | POA: Diagnosis not present

## 2018-03-09 DIAGNOSIS — F329 Major depressive disorder, single episode, unspecified: Secondary | ICD-10-CM | POA: Diagnosis not present

## 2018-03-09 DIAGNOSIS — Z87440 Personal history of urinary (tract) infections: Secondary | ICD-10-CM | POA: Diagnosis not present

## 2018-03-09 DIAGNOSIS — R69 Illness, unspecified: Secondary | ICD-10-CM | POA: Diagnosis not present

## 2018-03-09 DIAGNOSIS — K5010 Crohn's disease of large intestine without complications: Secondary | ICD-10-CM | POA: Diagnosis not present

## 2018-03-09 DIAGNOSIS — R630 Anorexia: Secondary | ICD-10-CM | POA: Diagnosis not present

## 2018-03-09 DIAGNOSIS — E441 Mild protein-calorie malnutrition: Secondary | ICD-10-CM | POA: Diagnosis not present

## 2018-03-09 DIAGNOSIS — I5032 Chronic diastolic (congestive) heart failure: Secondary | ICD-10-CM | POA: Diagnosis not present

## 2018-03-09 DIAGNOSIS — Z9181 History of falling: Secondary | ICD-10-CM | POA: Diagnosis not present

## 2018-03-09 DIAGNOSIS — I110 Hypertensive heart disease with heart failure: Secondary | ICD-10-CM | POA: Diagnosis not present

## 2018-03-10 NOTE — Telephone Encounter (Signed)
I have called twice on 2 sep days and vm does not accept messages.

## 2018-03-11 DIAGNOSIS — I110 Hypertensive heart disease with heart failure: Secondary | ICD-10-CM | POA: Diagnosis not present

## 2018-03-11 DIAGNOSIS — I5032 Chronic diastolic (congestive) heart failure: Secondary | ICD-10-CM | POA: Diagnosis not present

## 2018-03-11 DIAGNOSIS — R630 Anorexia: Secondary | ICD-10-CM | POA: Diagnosis not present

## 2018-03-11 DIAGNOSIS — K5010 Crohn's disease of large intestine without complications: Secondary | ICD-10-CM | POA: Diagnosis not present

## 2018-03-11 DIAGNOSIS — Z9181 History of falling: Secondary | ICD-10-CM | POA: Diagnosis not present

## 2018-03-11 DIAGNOSIS — E441 Mild protein-calorie malnutrition: Secondary | ICD-10-CM | POA: Diagnosis not present

## 2018-03-11 DIAGNOSIS — R69 Illness, unspecified: Secondary | ICD-10-CM | POA: Diagnosis not present

## 2018-03-11 DIAGNOSIS — F329 Major depressive disorder, single episode, unspecified: Secondary | ICD-10-CM | POA: Diagnosis not present

## 2018-03-11 DIAGNOSIS — Z87440 Personal history of urinary (tract) infections: Secondary | ICD-10-CM | POA: Diagnosis not present

## 2018-03-11 DIAGNOSIS — G894 Chronic pain syndrome: Secondary | ICD-10-CM | POA: Diagnosis not present

## 2018-03-13 ENCOUNTER — Other Ambulatory Visit: Payer: Self-pay | Admitting: Family Medicine

## 2018-03-16 NOTE — Telephone Encounter (Signed)
I spoke to patient and she said she will not be back. She said her feelings were hurt because Dr. Damita Dunnings was so rude to her son. I explained Dr. Damita Dunnings needed to speak to her about her health and her son was not allowing her to answer questions. She said she is not actively looking for a new PCP currently but she would not be back to see Dr. Damita Dunnings at [facility]. OK to remove PCP from chart at this point?

## 2018-03-17 NOTE — Telephone Encounter (Addendum)
Please remove me as the PCP.  To be clear, I was not rude to her son.  His behavior was appalling and he earned the right never to come back to this clinic.  I wish her the best.  As a courtesy, please let her daughter know.  Thanks.

## 2018-03-18 ENCOUNTER — Ambulatory Visit: Payer: Medicare HMO | Admitting: Internal Medicine

## 2018-03-18 DIAGNOSIS — R630 Anorexia: Secondary | ICD-10-CM | POA: Diagnosis not present

## 2018-03-18 DIAGNOSIS — I5032 Chronic diastolic (congestive) heart failure: Secondary | ICD-10-CM | POA: Diagnosis not present

## 2018-03-18 DIAGNOSIS — F0391 Unspecified dementia with behavioral disturbance: Secondary | ICD-10-CM | POA: Diagnosis not present

## 2018-03-18 DIAGNOSIS — Z9181 History of falling: Secondary | ICD-10-CM | POA: Diagnosis not present

## 2018-03-18 DIAGNOSIS — I110 Hypertensive heart disease with heart failure: Secondary | ICD-10-CM | POA: Diagnosis not present

## 2018-03-18 DIAGNOSIS — F329 Major depressive disorder, single episode, unspecified: Secondary | ICD-10-CM | POA: Diagnosis not present

## 2018-03-18 DIAGNOSIS — R69 Illness, unspecified: Secondary | ICD-10-CM | POA: Diagnosis not present

## 2018-03-18 DIAGNOSIS — Z87440 Personal history of urinary (tract) infections: Secondary | ICD-10-CM | POA: Diagnosis not present

## 2018-03-18 DIAGNOSIS — E441 Mild protein-calorie malnutrition: Secondary | ICD-10-CM | POA: Diagnosis not present

## 2018-03-18 DIAGNOSIS — G894 Chronic pain syndrome: Secondary | ICD-10-CM | POA: Diagnosis not present

## 2018-03-18 DIAGNOSIS — K5010 Crohn's disease of large intestine without complications: Secondary | ICD-10-CM | POA: Diagnosis not present

## 2018-03-22 ENCOUNTER — Emergency Department (HOSPITAL_COMMUNITY): Payer: Medicare HMO

## 2018-03-22 ENCOUNTER — Encounter (HOSPITAL_COMMUNITY): Payer: Self-pay

## 2018-03-22 ENCOUNTER — Emergency Department (HOSPITAL_COMMUNITY)
Admission: EM | Admit: 2018-03-22 | Discharge: 2018-03-22 | Disposition: A | Discharge status: Home / Self Care | Payer: Medicare HMO | Attending: Emergency Medicine | Admitting: Emergency Medicine

## 2018-03-22 ENCOUNTER — Other Ambulatory Visit: Payer: Self-pay

## 2018-03-22 DIAGNOSIS — R4182 Altered mental status, unspecified: Secondary | ICD-10-CM | POA: Diagnosis not present

## 2018-03-22 DIAGNOSIS — I110 Hypertensive heart disease with heart failure: Secondary | ICD-10-CM | POA: Insufficient documentation

## 2018-03-22 DIAGNOSIS — Z79899 Other long term (current) drug therapy: Secondary | ICD-10-CM | POA: Insufficient documentation

## 2018-03-22 DIAGNOSIS — E213 Hyperparathyroidism, unspecified: Secondary | ICD-10-CM | POA: Insufficient documentation

## 2018-03-22 DIAGNOSIS — S92352A Displaced fracture of fifth metatarsal bone, left foot, initial encounter for closed fracture: Secondary | ICD-10-CM | POA: Diagnosis not present

## 2018-03-22 DIAGNOSIS — R531 Weakness: Secondary | ICD-10-CM | POA: Diagnosis not present

## 2018-03-22 DIAGNOSIS — I5032 Chronic diastolic (congestive) heart failure: Secondary | ICD-10-CM | POA: Insufficient documentation

## 2018-03-22 DIAGNOSIS — Y929 Unspecified place or not applicable: Secondary | ICD-10-CM | POA: Diagnosis not present

## 2018-03-22 DIAGNOSIS — S79912A Unspecified injury of left hip, initial encounter: Secondary | ICD-10-CM | POA: Diagnosis not present

## 2018-03-22 DIAGNOSIS — Y999 Unspecified external cause status: Secondary | ICD-10-CM | POA: Diagnosis not present

## 2018-03-22 DIAGNOSIS — W19XXXA Unspecified fall, initial encounter: Secondary | ICD-10-CM | POA: Insufficient documentation

## 2018-03-22 DIAGNOSIS — Y939 Activity, unspecified: Secondary | ICD-10-CM | POA: Insufficient documentation

## 2018-03-22 LAB — CBC WITH DIFFERENTIAL/PLATELET
Abs Immature Granulocytes: 0.05 10*3/uL (ref 0.00–0.07)
Basophils Absolute: 0.1 10*3/uL (ref 0.0–0.1)
Basophils Relative: 1 %
Eosinophils Absolute: 0.1 10*3/uL (ref 0.0–0.5)
Eosinophils Relative: 2 %
HCT: 45.9 % (ref 36.0–46.0)
Hemoglobin: 15.1 g/dL — ABNORMAL HIGH (ref 12.0–15.0)
Immature Granulocytes: 1 %
Lymphocytes Relative: 10 %
Lymphs Abs: 0.7 10*3/uL (ref 0.7–4.0)
MCH: 30.8 pg (ref 26.0–34.0)
MCHC: 32.9 g/dL (ref 30.0–36.0)
MCV: 93.7 fL (ref 80.0–100.0)
Monocytes Absolute: 0.5 10*3/uL (ref 0.1–1.0)
Monocytes Relative: 7 %
Neutro Abs: 5.8 10*3/uL (ref 1.7–7.7)
Neutrophils Relative %: 79 %
Platelets: 250 10*3/uL (ref 150–400)
RBC: 4.9 MIL/uL (ref 3.87–5.11)
RDW: 12.5 % (ref 11.5–15.5)
WBC: 7.3 10*3/uL (ref 4.0–10.5)
nRBC: 0 % (ref 0.0–0.2)

## 2018-03-22 LAB — COMPREHENSIVE METABOLIC PANEL
ALT: 37 U/L (ref 0–44)
AST: 52 U/L — ABNORMAL HIGH (ref 15–41)
Albumin: 3.8 g/dL (ref 3.5–5.0)
Alkaline Phosphatase: 76 U/L (ref 38–126)
Anion gap: 6 (ref 5–15)
BUN: 22 mg/dL (ref 8–23)
CO2: 24 mmol/L (ref 22–32)
Calcium: 9.4 mg/dL (ref 8.9–10.3)
Chloride: 110 mmol/L (ref 98–111)
Creatinine, Ser: 0.82 mg/dL (ref 0.44–1.00)
GFR calc Af Amer: >60 mL/min (ref >60)
GFR calc non Af Amer: >60 mL/min (ref >60)
Glucose, Bld: 106 mg/dL — ABNORMAL HIGH (ref 70–99)
Potassium: 4.8 mmol/L (ref 3.5–5.1)
Sodium: 140 mmol/L (ref 135–145)
Total Bilirubin: 1.2 mg/dL (ref 0.3–1.2)
Total Protein: 6.7 g/dL (ref 6.5–8.1)

## 2018-03-22 MED ORDER — TRAMADOL HCL 50 MG PO TABS: 50.0000 mg | ORAL_TABLET | Freq: Two times a day (BID) | ORAL | 0 refills | Status: DC | PRN

## 2018-03-22 NOTE — ED Notes (Signed)
ED Provider at bedside. 

## 2018-03-22 NOTE — Discharge Instructions (Signed)
You can take 500 to 650 mg of Tylenol every 6 hours as needed for pain.  You can take tramadol as needed for severe pain but do not drive, drink alcohol, operate heavy machinery, or make important decisions while taking this medicine as it may make you drowsy.  Wear the postop shoe and continue to use your walker to help you get around.  Follow-up with orthopedics for reevaluation of the fracture in your foot.  Return to the emergency department immediately for any concerning signs or symptoms develop such as fevers, severe swelling, weakness, altered mental status, or persistent vomiting.

## 2018-03-22 NOTE — ED Triage Notes (Signed)
Pt from home with c.o foot pain from a fall that occurred yesterday. Pt was walking with walker when she fell. Denies LOC. Left foot swollen, pulses palpable, +ROM. Pt alert, nad

## 2018-03-22 NOTE — ED Notes (Signed)
Patient transported to X-ray 

## 2018-03-22 NOTE — ED Provider Notes (Signed)
Medical screening examination/treatment/procedure(s) were conducted as a shared visit with non-physician practitioner(s) and myself.  I personally evaluated the patient during the encounter.  EKG Interpretation  Date/Time:  Sunday March 22 2018 17:52:00 EDT Ventricular Rate:  64 PR Interval:    QRS Duration: 75 QT Interval:  435 QTC Calculation: 449 R Axis:   13 Text Interpretation:  Sinus rhythm Confirmed by Fredia Sorrow [phone]) on 03/22/2018 6:04:39 PM   Patient seen by me along with physician assistant.  Patient brought in from home by family members.  Patient had a fall at home.  Family members are pretty certain it did not include syncope.  Patient lives at home by herself on the first floor.  Does have a roommate situation.  The patient has had difficulty with frequent falls.  Triage note states that it occurred yesterday.  Patient's obvious injuries to the left foot the lateral aspect of it has some swelling.  Patient with complaint of some pain there.  Patient probably fell in the bathroom.  Is what family believes.  Work-up here without any significant findings other than a slightly displaced distal fifth metatarsal fracture on the left foot.  This can be followed up by greens per orthopedics that is seen the patient in the past.  And can be treated with a postop shoe.  Patient probably will require some tramadol for some mild pain relief.  Discussed also elevation of the foot is much as possible.  Results for orders placed or performed during the hospital encounter of 03/22/18  CBC with Differential  Result Value Ref Range   WBC 7.3 4.0 - 10.5 K/uL   RBC 4.90 3.87 - 5.11 MIL/uL   Hemoglobin 15.1 (H) 12.0 - 15.0 g/dL   HCT 45.9 36.0 - 46.0 %   MCV 93.7 80.0 - 100.0 fL   MCH 30.8 26.0 - 34.0 pg   MCHC 32.9 30.0 - 36.0 g/dL   RDW 12.5 11.5 - 15.5 %   Platelets 250 150 - 400 K/uL   nRBC 0.0 0.0 - 0.2 %   Neutrophils Relative % 79 %   Neutro Abs 5.8 1.7 - 7.7 K/uL   Lymphocytes Relative 10 %   Lymphs Abs 0.7 0.7 - 4.0 K/uL   Monocytes Relative 7 %   Monocytes Absolute 0.5 0.1 - 1.0 K/uL   Eosinophils Relative 2 %   Eosinophils Absolute 0.1 0.0 - 0.5 K/uL   Basophils Relative 1 %   Basophils Absolute 0.1 0.0 - 0.1 K/uL   Immature Granulocytes 1 %   Abs Immature Granulocytes 0.05 0.00 - 0.07 K/uL  Comprehensive metabolic panel  Result Value Ref Range   Sodium 140 135 - 145 mmol/L   Potassium 4.8 3.5 - 5.1 mmol/L   Chloride 110 98 - 111 mmol/L   CO2 24 22 - 32 mmol/L   Glucose, Bld 106 (H) 70 - 99 mg/dL   BUN 22 8 - 23 mg/dL   Creatinine, Ser 0.82 0.44 - 1.00 mg/dL   Calcium 9.4 8.9 - 10.3 mg/dL   Total Protein 6.7 6.5 - 8.1 g/dL   Albumin 3.8 3.5 - 5.0 g/dL   AST 52 (H) 15 - 41 U/L   ALT 37 0 - 44 U/L   Alkaline Phosphatase 76 38 - 126 U/L   Total Bilirubin 1.2 0.3 - 1.2 mg/dL   GFR calc non Af Amer >60 >60 mL/min   GFR calc Af Amer >60 >60 mL/min   Anion gap 6 5 - 15  Ct Head Wo Contrast  Result Date: 03/22/2018 CLINICAL DATA:  Patient with altered mental status. EXAM: CT HEAD WITHOUT CONTRAST TECHNIQUE: Contiguous axial images were obtained from the base of the skull through the vertex without intravenous contrast. COMPARISON:  MRI brain 03/19/2009; CT brain 03/18/2009 FINDINGS: Brain: Ventricles and sulci are prominent compatible with atrophy. Periventricular and subcortical white matter hypodensity compatible with chronic microvascular ischemic changes. No evidence for acute cortically based infarct, intracranial hemorrhage, mass lesion or mass-effect. Vascular: Unremarkable. Skull: Intact Sinuses/Orbits: Opacification of the left mastoid air cells. Paranasal sinuses are well aerated. Other: None. IMPRESSION: No acute intracranial process. Atrophy and chronic microvascular ischemic changes. Electronically Signed   By: Lovey Newcomer M.D.   On: 03/22/2018 17:45   Dg Foot Complete Left  Result Date: 03/22/2018 CLINICAL DATA:  Pain, swelling,  and bruising secondary to a fall yesterday. EXAM: LEFT FOOT - COMPLETE 3+ VIEW COMPARISON:  None. FINDINGS: There is a slightly displaced spiral fracture of the distal shaft of the fifth metatarsal. No other acute bone abnormality. Diffuse osteopenia. IMPRESSION: Fracture of the distal fifth metatarsal. Electronically Signed   By: Lorriane Shire M.D.   On: 03/22/2018 18:06   Dg Hip Unilat With Pelvis 2-3 Views Left  Result Date: 03/22/2018 CLINICAL DATA:  Trauma secondary to a fall yesterday. EXAM: DG HIP (WITH OR WITHOUT PELVIS) 2-3V LEFT COMPARISON:  Radiographs dated 06/20/2004 FINDINGS: Left hemiarthroplasty, unchanged since the prior study. No fracture or dislocation. Osteopenia. IMPRESSION: No acute abnormalities. Electronically Signed   By: Lorriane Shire M.D.   On: 03/22/2018 18:08   Patient had CT of the head and x-rays of the left hip and pelvis without any acute findings there.  Patient has had a hip replacement on that side in the past.  No new acute injury.  Urinalysis is not back yet.  But family does not want to wait for urinalysis.  Patient is nontoxic no acute distress.  She is mentally alert.  Cardiac monitoring is showed no arrhythmias.  Labs without any significant abnormalities.  Patient can be discharged home with follow-up with [facility] orthopedics and tramadol for pain postop shoe and elevation of the left foot is much as possible.  Left foot has good cap refill to the toes.  There is some evidence of some fungal infection to the toenails.  Nothing severe.   Fredia Sorrow, MD 03/22/18 [phone]

## 2018-03-22 NOTE — ED Notes (Signed)
Pt experienced trouble standing during orthostatics. Pt stated she was not able to give urine sample at the moment. Bed is locked and pt is in positron of comfort with call light within reach. Family stepped out for food.

## 2018-03-22 NOTE — ED Provider Notes (Signed)
[facility] EMERGENCY DEPARTMENT Provider Note   CSN: [id number] Arrival date & time: 03/22/18  1624     History   Chief Complaint Chief Complaint  Patient presents with  . Foot Pain    HPI [name] is a 84 y.o. female with history of CHF, Crohn's disease, diverticulosis, hyperparathyroidism, hypertension, osteoporosis presents for evaluation after fall yesterday.  The patient tells me that she is ambulatory with the aid of a walker and was attempting to go to the kitchen when she fell.  She does not remember how she fell but does not think there was any prodrome leading up to the fall.  The patient's daughter at bedside states that in fact she fell in the bathroom at around 5 PM yesterday.  Fall was unwitnessed but she thinks the patient was attempting to clean herself as there was feces on the floor.  The patient then called her grandson and immediately asked for her daughter.  She was found on the couch in her diaper and a shirt.  She presently endorses pain only to the left foot which occurs with attempts to weight-bear or ambulate.  There is swelling and ecchymosis to the dorsum of the left foot.  She also feels as though her left lower extremity is heavier.  She denies headache, lightheadedness, vision changes, nausea, vomiting, chest pain, shortness of breath, or abdominal pain.  She does not think she hit her ahead but does not really remember the fall.  She is not on any bloodthinners. Daughter notes patient has had worsening problems with memory but is more lucid than she is not.   The history is provided by the patient.    Past Medical History:  Diagnosis Date  . Acute diastolic (congestive) heart failure ([place])   . Bladder prolapse, female, acquired    pessary in  . Chronic diastolic (congestive) heart failure ([place])   . Chronic pain syndrome   . Complete uterine prolapse   . Crohn's disease ([place]) 2010  . Diverticulosis   . E. coli UTI 06/09/2013    . Fall 05/2013  . Hemorrhoids   . History of hypercalcemia   . History of hyperglycemia   . Hydronephrosis   . Hyperparathyroidism   . Hypertension   . Multiple pelvic fractures 07/02/2013  . Osteoporosis   . Pubic ramus fracture ([place])   . Urinary incontinence 09/09/2011  . Vitamin D deficiency     Patient Active Problem List   Diagnosis Date Noted  . Risk for falls 01/20/2018  . Weakness 12/04/2017  . Acute cystitis without hematuria 12/04/2017  . Elevated blood pressure reading 12/04/2017  . Chronic diastolic CHF (congestive heart failure) ([place]) 12/04/2017  . Malnutrition of moderate degree 12/04/2017  . Chest wall pain 04/03/2017  . Shingles 03/12/2017  . Healthcare maintenance 12/18/2016  . Advance care planning 12/18/2016  . Multiple pelvic fractures 07/02/2013  . Urinary incontinence 09/09/2011  . Osteoporosis 10/17/2009  . Primary hyperparathyroidism ([place]) 10/02/2009  . Vitamin D deficiency 01/06/2009  . CROHN'S DISEASE, SMALL INTESTINE 01/04/2009  . Bladder prolapse, female, acquired 01/04/2009    Past Surgical History:  Procedure Laterality Date  . COLONOSCOPY  12/23/2008   crohn's, diverticulosis, internal hemorrhoids  . HIP FRACTURE SURGERY Left    left, prosthesis  . LAPAROSCOPY  10/2009  . PARATHYROIDECTOMY Left 08/30/2013   Procedure: LEFT INFERIOR PARATHYROIDECTOMY;  Surgeon: Earnstine Regal, MD;  Location: [facility];  Service: General;  Laterality: Left;  . RHINOPLASTY    .  TONSILLECTOMY       OB History    Gravida  3   Para  3   Term  3   Preterm      AB      Living  3     SAB      TAB      Ectopic      Multiple      Live Births               Home Medications    Prior to Admission medications   Medication Sig Start Date End Date Taking? Authorizing Provider  budesonide (ENTOCORT EC) 3 MG 24 hr capsule Take 3 capsules (9 mg total) by mouth daily. 02/13/18  Yes Tonia Ghent, MD  calcium-vitamin D (OSCAL WITH D) 500-200 MG-UNIT  tablet Take 1 tablet by mouth 2 (two) times daily.   Yes [provider]  donepezil (ARICEPT) 5 MG tablet Take 1 tablet (5 mg total) by mouth at bedtime. 02/13/18  Yes Tonia Ghent, MD  feeding supplement, ENSURE ENLIVE, (ENSURE ENLIVE) LIQD Take 237 mLs by mouth 3 (three) times daily between meals. Patient taking differently: Take 237 mLs by mouth 3 (three) times daily between meals. Vanilla 12/08/17  Yes Annita Brod, MD  hydrALAZINE (APRESOLINE) 25 MG tablet Take 1 tablet (25 mg total) by mouth every 8 (eight) hours. 02/13/18  Yes Tonia Ghent, MD  losartan (COZAAR) 25 MG tablet Take 1 tablet (25 mg total) by mouth daily. 02/13/18  Yes Tonia Ghent, MD  mirtazapine (REMERON) 7.5 MG tablet Take 1 tablet (7.5 mg total) by mouth at bedtime. 02/15/18  Yes Tonia Ghent, MD  Multiple Vitamin (MULTIVITAMIN) tablet Take 1 tablet by mouth daily.   Yes [provider]  sertraline (ZOLOFT) 25 MG tablet TAKE 1 TABLET BY MOUTH ONCE DAILY Patient taking differently: Take 25 mg by mouth daily.  03/13/18  Yes Tonia Ghent, MD  traMADol (ULTRAM) 50 MG tablet Take 1 tablet (50 mg total) by mouth every 12 (twelve) hours as needed for severe pain. 03/22/18   Renita Papa, PA-C    Family History Family History  Problem Relation Age of Onset  . Heart disease Father   . Colon cancer Father   . Osteoporosis Mother   . Breast cancer Mother   . Prostate cancer Brother     Social History Social History   Tobacco Use  . Smoking status: Never Smoker  . Smokeless tobacco: Never Used  Substance Use Topics  . Alcohol use: Yes    Alcohol/week: 0.0 standard drinks    Comment: occ wine  . Drug use: No     Allergies   Patient has no known allergies.   Review of Systems Review of Systems  Constitutional: Negative for chills and fever.  Respiratory: Negative for shortness of breath.   Cardiovascular: Negative for chest pain.  Musculoskeletal: Positive for arthralgias.  Negative for back pain and neck pain.  Neurological: Positive for syncope (? possible) and weakness. Negative for numbness and headaches.  All other systems reviewed and are negative.    Physical Exam Updated Vital Signs BP (!) 142/70   Pulse 66   Temp 98.2 F (36.8 C) (Oral)   Resp (!) 22   SpO2 97%   Physical Exam  Constitutional: She is oriented to person, place, and time. She appears well-developed and well-nourished. No distress.  HENT:  Head: Normocephalic and atraumatic.  No Battle's signs, no  raccoon's eyes, no rhinorrhea. No hemotympanum. No tenderness to palpation of the face or skull. No deformity, crepitus, or swelling noted.   Eyes: Pupils are equal, round, and reactive to light. Conjunctivae and EOM are normal. Right eye exhibits no discharge. Left eye exhibits no discharge.  Neck: Normal range of motion. Neck supple. No JVD present. No tracheal deviation present.  No midline spine TTP, no paraspinal muscle tenderness, no deformity, crepitus, or step-off noted   Cardiovascular: Normal rate, regular rhythm, normal heart sounds and intact distal pulses.  2+ radial and DP/PT pulses bilaterally, Homans sign absent bilaterally, no lower extremity edema, no palpable cords, compartments are soft   Pulmonary/Chest: Effort normal and breath sounds normal. No stridor. No respiratory distress. She has no wheezes. She has no rales. She exhibits no tenderness.  Abdominal: Soft. Bowel sounds are normal. She exhibits no distension. There is no tenderness. There is no guarding.  Musculoskeletal: Normal range of motion. She exhibits no edema.  No midline spine TTP, no paraspinal muscle tenderness, no deformity, crepitus, or step-off noted.  Pelvis appears stable.  4+/5 strength of the left hip flexor, otherwise 5/5 strength of BUE and BLE major muscle groups.  Ecchymosis and swelling noted to the dorsum of the left foot, tender to palpation overlying the lateral aspect of the left foot.   No crepitus or deformity noted.  Neurological: She is alert and oriented to person, place, and time. No cranial nerve deficit or sensory deficit. She exhibits normal muscle tone.  Mental Status:  Alert, thought content appropriate, somewhat confused. Speech fluent without evidence of aphasia. Able to follow 2 step commands without difficulty.  Cranial Nerves:  II:  Peripheral visual fields grossly normal, pupils equal, round, reactive to light III,IV, VI: ptosis not present, extra-ocular motions intact bilaterally  V,VII: smile symmetric, facial light touch sensation equal VIII: hearing grossly normal to voice  X: uvula elevates symmetrically  XI: bilateral shoulder shrug symmetric and strong XII: midline tongue extension without fassiculations Motor:  Normal tone. Strong and equal grip strength and dorsiflexion/plantar flexion Sensory: light touch normal in all extremities.   Skin: Skin is warm and dry. No erythema.  Psychiatric: She has a normal mood and affect. Her behavior is normal.  Nursing note and vitals reviewed.    ED Treatments / Results  Labs (all labs ordered are listed, but only abnormal results are displayed) Labs Reviewed  CBC WITH DIFFERENTIAL/PLATELET - Abnormal; Notable for the following components:      Result Value   Hemoglobin 15.1 (*)    All other components within normal limits  COMPREHENSIVE METABOLIC PANEL - Abnormal; Notable for the following components:   Glucose, Bld 106 (*)    AST 52 (*)    All other components within normal limits  URINALYSIS, ROUTINE W REFLEX MICROSCOPIC    EKG EKG Interpretation  Date/Time:  Sunday March 22 2018 17:52:00 EDT Ventricular Rate:  64 PR Interval:    QRS Duration: 75 QT Interval:  435 QTC Calculation: 449 R Axis:   13 Text Interpretation:  Sinus rhythm Confirmed by Fredia Sorrow [phone]) on 03/22/2018 6:04:39 PM   Radiology Ct Head Wo Contrast  Result Date: 03/22/2018 CLINICAL DATA:  Patient with  altered mental status. EXAM: CT HEAD WITHOUT CONTRAST TECHNIQUE: Contiguous axial images were obtained from the base of the skull through the vertex without intravenous contrast. COMPARISON:  MRI brain 03/19/2009; CT brain 03/18/2009 FINDINGS: Brain: Ventricles and sulci are prominent compatible with atrophy. Periventricular and subcortical white  matter hypodensity compatible with chronic microvascular ischemic changes. No evidence for acute cortically based infarct, intracranial hemorrhage, mass lesion or mass-effect. Vascular: Unremarkable. Skull: Intact Sinuses/Orbits: Opacification of the left mastoid air cells. Paranasal sinuses are well aerated. Other: None. IMPRESSION: No acute intracranial process. Atrophy and chronic microvascular ischemic changes. Electronically Signed   By: Lovey Newcomer M.D.   On: 03/22/2018 17:45   Dg Foot Complete Left  Result Date: 03/22/2018 CLINICAL DATA:  Pain, swelling, and bruising secondary to a fall yesterday. EXAM: LEFT FOOT - COMPLETE 3+ VIEW COMPARISON:  None. FINDINGS: There is a slightly displaced spiral fracture of the distal shaft of the fifth metatarsal. No other acute bone abnormality. Diffuse osteopenia. IMPRESSION: Fracture of the distal fifth metatarsal. Electronically Signed   By: Lorriane Shire M.D.   On: 03/22/2018 18:06   Dg Hip Unilat With Pelvis 2-3 Views Left  Result Date: 03/22/2018 CLINICAL DATA:  Trauma secondary to a fall yesterday. EXAM: DG HIP (WITH OR WITHOUT PELVIS) 2-3V LEFT COMPARISON:  Radiographs dated 06/20/2004 FINDINGS: Left hemiarthroplasty, unchanged since the prior study. No fracture or dislocation. Osteopenia. IMPRESSION: No acute abnormalities. Electronically Signed   By: Lorriane Shire M.D.   On: 03/22/2018 18:08    Procedures Procedures (including critical care time)  Medications Ordered in ED Medications - No data to display   Initial Impression / Assessment and Plan / ED Course  I have reviewed the triage vital signs  and the nursing notes.  Pertinent labs & imaging results that were available during my care of the patient were reviewed by me and considered in my medical decision making (see chart for details).     Patient presents for evaluation of left foot pain after fall yesterday.  She is a poor historian and has some worsening memory issues.  Unclear if she lost consciousness although less likely as it sounds that she was able to get a hold of family on the phone rather quickly after the fall.  She is afebrile, mildly hypertensive in the ED.  She is nontoxic in appearance.  She is neurovascularly intact.  Ambulates with a walker at baseline.  Head CT shows no acute intracranial abnormality, no evidence of skull fracture, ICH, SAH, or CVA.  Radiographs significant for acute fracture of the distal fifth metatarsal.  EKG shows normal sinus rhythm, no arrhythmia or ischemic changes noted.  Lab work reviewed by me shows no leukocytosis, anemia, or metabolic derangements.  Evaluation she is resting comfortably no apparent distress.  Will give postop shoe and tramadol for pain control.  She does have a roommate who helps care for her and family is quite attentive and visit her frequently.  She has had home health in the past but refused their care.  Recommend follow-up with orthopedist for reevaluation of fracture.  Discussed strict ED return precautions.  Patient and patient's family verbalized understanding of and agreement with plan and patient is stable for discharge home at this time.  Patient was seen and evaluated by Dr. Rogene Houston who agrees with assessment and plan at this time.  Final Clinical Impressions(s) / ED Diagnoses   Final diagnoses:  Closed displaced fracture of fifth metatarsal bone of left foot, initial encounter  Fall, initial encounter    ED Discharge Orders         Ordered    traMADol (ULTRAM) 50 MG tablet  Every 12 hours PRN     03/22/18 [place],  Gavin Pound, PA-C 03/22/18  2024    Fredia Sorrow, MD 03/31/18 0200

## 2018-03-30 DIAGNOSIS — S92352A Displaced fracture of fifth metatarsal bone, left foot, initial encounter for closed fracture: Secondary | ICD-10-CM | POA: Diagnosis not present

## 2018-03-30 DIAGNOSIS — M79672 Pain in left foot: Secondary | ICD-10-CM | POA: Diagnosis not present

## 2018-03-31 ENCOUNTER — Other Ambulatory Visit: Payer: Self-pay | Admitting: Family Medicine

## 2018-03-31 NOTE — Telephone Encounter (Signed)
Electronic refill request Last office visit 01/19/18 See phone notes regarding PCP change.

## 2018-04-01 NOTE — Telephone Encounter (Signed)
I have left a message for daughter, [name], to discuss this with me in further detail as per our last conversation, she felt that her mother did want to continue under Dr. Josefine Class care.

## 2018-04-01 NOTE — Telephone Encounter (Signed)
This lady stated she wasn't going to come back to the clinic here and didn't want me as PCP.  Please remove me as PCP.   I denied the rx.   Let her daughter know that her refills will need to come through outside clinic.  Do not call her son.  Thanks.

## 2018-04-01 NOTE — Telephone Encounter (Signed)
Per rx encounter I have left a message for [name] (daughter) to discuss this further as it was her understanding that mother wanted to continue coming to Dr. Damita Dunnings and she would be bringing her to her appointments.  Will clarify and determine best next steps with those involved.  Thanks.

## 2018-04-06 ENCOUNTER — Telehealth: Payer: Self-pay | Admitting: Family Medicine

## 2018-04-06 NOTE — Telephone Encounter (Signed)
Please refer to refill/phone encounter for 03/31/18.  Thanks.

## 2018-04-06 NOTE — Telephone Encounter (Signed)
Routed to wrong office 

## 2018-04-06 NOTE — Telephone Encounter (Signed)
Copied from [place] [phone]. Topic: Quick Communication - See Telephone Encounter >> Apr 06, 2018  2:09 PM Blase Mess A wrote: CRM for notification. See Telephone encounter for: 04/06/18.  Lyn Beson [phone] is returning [place].

## 2018-04-06 NOTE — Telephone Encounter (Signed)
Spoke with daughter.  She has asked that I call and discuss this in detail with her mother as they have had further discussion at home and would like for her mother to express her wishes.

## 2018-04-07 ENCOUNTER — Telehealth: Payer: Self-pay | Admitting: Family Medicine

## 2018-04-07 NOTE — Telephone Encounter (Signed)
See previous messages about patient changing PCP. Please advise.

## 2018-04-07 NOTE — Telephone Encounter (Signed)
Copied from [place] [phone]. Topic: Quick Communication - See Telephone Encounter >> Apr 07, 2018 11:49 AM Conception Chancy, NT wrote: CRM for notification. See Telephone encounter for: 04/07/18.  Patient is calling and requesting a refill on the following medications.  budesonide (ENTOCORT EC) 3 MG 24 hr capsule  (2nd day without medication) mirtazapine (REMERON) 7.5 MG tablet  donepezil (ARICEPT) 5 MG tablet  losartan (COZAAR) 25 MG tablet  hydrALAZINE (APRESOLINE) 25 MG tablet  [facility] ([facility] [place] [address] [place] [address] Phone: [phone] Fax: [phone]

## 2018-04-08 DIAGNOSIS — N813 Complete uterovaginal prolapse: Secondary | ICD-10-CM | POA: Diagnosis not present

## 2018-04-08 DIAGNOSIS — R54 Age-related physical debility: Secondary | ICD-10-CM | POA: Diagnosis not present

## 2018-04-08 DIAGNOSIS — Z9181 History of falling: Secondary | ICD-10-CM | POA: Diagnosis not present

## 2018-04-08 DIAGNOSIS — Z4689 Encounter for fitting and adjustment of other specified devices: Secondary | ICD-10-CM | POA: Diagnosis not present

## 2018-04-09 NOTE — Telephone Encounter (Signed)
Please refer to medication refill encounter for 04/07/18 for further documentation around this discussion.

## 2018-04-09 NOTE — Telephone Encounter (Signed)
No answer at patient's home number. I have spoken with the daughter several times over past 2-4 weeks regarding this situation.Daughter was under the impression that mother did want to continue coming to see Dr. Damita Dunnings.   I was instructed by daughter to please discuss her mothers wishes with her directly as she does not know that her mother was clear on what we were saying when she spoke with [name] and stated she wanted to see another doctor.  I am still trying to reach mother to discuss further.  In the interim, regardless of whether patient is transferring, we would still need to manage her medications for 30 days to give her time to transfer.  I have notified the daughter of this and will speak with the mother about this as soon as I can get in touch with her.  Dr. Damita Dunnings, are you comfortable giving patient 1 more month of medications stating that this is the last refill, either she will need to be seen within 30 days or transfer care under another physician???  I will continue to try and reach her.

## 2018-04-10 NOTE — Telephone Encounter (Signed)
Okay to send 30 day supply but the patient was clear with [name] that she wasn't coming back.  She needs to est care at another clinic.

## 2018-04-15 MED ORDER — BUDESONIDE 3 MG PO CPEP: 9.0000 mg | ORAL_CAPSULE | Freq: Every day | ORAL | 0 refills | Status: DC

## 2018-04-15 MED ORDER — DONEPEZIL HCL 5 MG PO TABS: 5.0000 mg | ORAL_TABLET | Freq: Every day | ORAL | 0 refills | Status: DC

## 2018-04-15 MED ORDER — MIRTAZAPINE 7.5 MG PO TABS: 7.5000 mg | ORAL_TABLET | Freq: Every day | ORAL | 0 refills | Status: DC

## 2018-04-15 MED ORDER — LOSARTAN POTASSIUM 25 MG PO TABS: 25.0000 mg | ORAL_TABLET | Freq: Every day | ORAL | 0 refills | Status: DC

## 2018-04-15 MED ORDER — HYDRALAZINE HCL 25 MG PO TABS: 25.0000 mg | ORAL_TABLET | Freq: Three times a day (TID) | ORAL | 0 refills | Status: DC

## 2018-04-15 NOTE — Telephone Encounter (Signed)
Spoke with patient and caregiver Mortimer Fries at daughter Lizabeth Leyden request to finalize patient desires and what meds are needed for refill.  Patient confirms CLEARLY that she wants to transfer care to another provider.  She was informed that we will give her 30 days on her medications to last through the first week of December;however, at that time we will no longer be prescribing her medications.  She has until then to work with her son and daughter on transferring her care to another provider.  Patient agrees with plan and verbalizes understanding.    I followed up this conversation and informed daughter [name] of the above decision that her mother made.   I informed her that at this point we do not need to continue going back and forth and the transfer needs to occur as it is clear her mother's feelings and perception of the visit with Dr. Damita Dunnings continue to be negative and it will not do any good to continue down this path.  A break needs to occur at this point.  Daughter verbalizes understanding.  She is aware that they have 30 days to get her mother under another PCP to continue care and r/x management.    Daughter thanks [place] for all of the care and attention given to her mother and this situation. Patient's chart has been flagged that she no longer wants Dr. Damita Dunnings as her PCP.  At end of 30 day period, Dr. Damita Dunnings to be removed as PCP.     Per Caregiver, patient is needing refills on her hydralazine, mirtazapine and losartan.30 day R/X's given for each and sent in to the pharmacy.

## 2018-04-15 NOTE — Telephone Encounter (Signed)
[name], Triage Nurse, spoke with patient regarding her wishes and she continues to acknowledge wish to transfer care as long as she can get her medications.    R/X's being refilled today for 30 day period.    I also tried to call mother back to discuss further per daughter [name]'s request but no answer and VM not set up to receive messages.  At this point we will go ahead with transfer of care plans and 30 day r/x's sent in for requested meds.    I will f/u with daughter [name].  Thanks.

## 2018-04-15 NOTE — Telephone Encounter (Signed)
[name] pts son (DPR signed) left v/m requesting cb from [facility] re; confusion about getting refills.

## 2018-04-15 NOTE — Telephone Encounter (Signed)
Thanks

## 2018-04-15 NOTE — Telephone Encounter (Signed)
[name] and [name] both called the office today to clarify needs and wishes moving forward.  First I spoke with [name] and he expressed his regret of how prior visit went but states that his mother has made her own mind to not continue care here.  He does want to be sure that her medications are sent him and I assured him that they were.  (R/X's being ordered today for donepezil and budesonide).  He also would like for Dr. Damita Dunnings to consider referring her to Neuro before her 30 days is up.  I explained that as the patient is not wanting to see Dr. Damita Dunnings moving forward that she has a 30 day period of fill for medications and will need to establish elsewhere in that time period.  [name] verbalizes understanding and wishes to be removed from the equation and ended conversation with me today with the understanding that I would be communicating wishes further with his mother who is now on the other line to speak with me.

## 2018-04-15 NOTE — Telephone Encounter (Signed)
Pt called to verify going to get her meds refilled.  Mandy RN said OK to refill donepezil  And Budesonide for 30 day supply. Refilled to [facility] [address] as instructed.

## 2018-04-27 DIAGNOSIS — S92352D Displaced fracture of fifth metatarsal bone, left foot, subsequent encounter for fracture with routine healing: Secondary | ICD-10-CM | POA: Diagnosis not present

## 2018-04-27 DIAGNOSIS — M79672 Pain in left foot: Secondary | ICD-10-CM | POA: Diagnosis not present

## 2018-04-28 ENCOUNTER — Encounter: Payer: Self-pay | Admitting: Internal Medicine

## 2018-04-28 ENCOUNTER — Ambulatory Visit: Payer: Medicare HMO | Admitting: Internal Medicine

## 2018-04-28 DIAGNOSIS — K5000 Crohn's disease of small intestine without complications: Secondary | ICD-10-CM

## 2018-04-28 DIAGNOSIS — E210 Primary hyperparathyroidism: Secondary | ICD-10-CM

## 2018-04-28 NOTE — Assessment & Plan Note (Addendum)
Stable Continue current care she seems well on budesonide 9 mg daily.  I know she has osteoporosis but I think that the best we can do in this situation as far as her treatment.  should see me in about 6 months sooner if needed

## 2018-04-28 NOTE — Progress Notes (Signed)
[name] 84 y.o. May 15, 1933 [id number]  Assessment & Plan:   CROHN'S DISEASE, SMALL INTESTINE Stable Continue current care she seems well on budesonide 9 mg daily.  I know she has osteoporosis but I think that the best we can do in this situation as far as her treatment.  should see me in about 6 months sooner if needed   Primary hyperparathyroidism Last labs were ok    I told the patient and her caregiver, [name], that I was willing to fill prescriptions in the interim while she searches for a new PCP.    Subjective:   Chief Complaint: Follow-up of Crohn's disease  HPI [name] is here with her caregiver named [name] who is living with her.  She is in a wheelchair, she had a fracture of her fifth metatarsal of the left foot.  Apparently she fell or tripped at home without loss of consciousness.  She is in a walking type bracel.  He denies any blood with her Crohn's disease and is still taking the budesonide. She reiterates that she will not see [name] again because he asked her [name] to leave the room and they had a disagreement. No Known Allergies Current Meds  Medication Sig  . budesonide (ENTOCORT EC) 3 MG 24 hr capsule Take 3 capsules (9 mg total) by mouth daily.  . calcium-vitamin D (OSCAL WITH D) 500-200 MG-UNIT tablet Take 1 tablet by mouth 2 (two) times daily.  Marland Kitchen donepezil (ARICEPT) 5 MG tablet Take 1 tablet (5 mg total) by mouth at bedtime.  . feeding supplement, ENSURE ENLIVE, (ENSURE ENLIVE) LIQD Take 237 mLs by mouth 3 (three) times daily between meals. (Patient taking differently: Take 237 mLs by mouth 3 (three) times daily between meals. Vanilla)  . hydrALAZINE (APRESOLINE) 25 MG tablet Take 1 tablet (25 mg total) by mouth every 8 (eight) hours.  Marland Kitchen losartan (COZAAR) 25 MG tablet Take 1 tablet (25 mg total) by mouth daily.  . mirtazapine (REMERON) 7.5 MG tablet Take 1 tablet (7.5 mg total) by mouth at bedtime.  . Multiple Vitamin (MULTIVITAMIN)  tablet Take 1 tablet by mouth daily.  . sertraline (ZOLOFT) 25 MG tablet TAKE 1 TABLET BY MOUTH ONCE DAILY (Patient taking differently: Take 25 mg by mouth daily. )   Past Medical History:  Diagnosis Date  . Acute diastolic (congestive) heart failure ([place])   . Bladder prolapse, female, acquired    pessary in  . Chronic diastolic (congestive) heart failure ([place])   . Chronic pain syndrome   . Complete uterine prolapse   . Crohn's disease ([place]) 2010  . Diverticulosis   . E. coli UTI 06/09/2013  . Fall 05/2013  . Hemorrhoids   . History of hypercalcemia   . History of hyperglycemia   . Hydronephrosis   . Hyperparathyroidism   . Hypertension   . Multiple pelvic fractures 07/02/2013  . Osteoporosis   . Pubic ramus fracture ([place])   . Urinary incontinence 09/09/2011  . Vitamin D deficiency    Past Surgical History:  Procedure Laterality Date  . COLONOSCOPY  12/23/2008   crohn's, diverticulosis, internal hemorrhoids  . HIP FRACTURE SURGERY Left    left, prosthesis  . LAPAROSCOPY  10/2009  . PARATHYROIDECTOMY Left 08/30/2013   Procedure: LEFT INFERIOR PARATHYROIDECTOMY;  Surgeon: Earnstine Regal, MD;  Location: [facility];  Service: General;  Laterality: Left;  . RHINOPLASTY    . TONSILLECTOMY     Social History   Social History Narrative  Widowed.  Lives with a caregiver   3 kids.     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   family history includes Breast cancer in her mother; Colon cancer in her father; Heart disease in her father; Osteoporosis in her mother; Prostate cancer in her brother.   Review of Systems As above  Objective:   Physical Exam BP 130/88   Pulse 67   Ht 5' 1"  (1.549 m)   Wt 113 lb (51.3 kg)   BMI 21.35 kg/m  Elderly in w/c Lefyt foot in walking cast Lungs cta Ht sounds NL abd soft and NT She is alert - has forgotten that I know/met all 3 of her children   Lab Results  Component Value Date     WBC 7.3 03/22/2018   HGB 15.1 (H) 03/22/2018   HCT 45.9 03/22/2018   MCV 93.7 03/22/2018   PLT 250 03/22/2018   Lab Results  Component Value Date   CREATININE 0.82 03/22/2018   BUN 22 03/22/2018   NA 140 03/22/2018   K 4.8 03/22/2018   CL 110 03/22/2018   CO2 24 03/22/2018    Lab Results  Component Value Date   ALT 37 03/22/2018   AST 52 (H) 03/22/2018   ALKPHOS 76 03/22/2018   BILITOT 1.2 03/22/2018

## 2018-04-28 NOTE — Patient Instructions (Signed)
  Dr Carlean Purl will be in touch about a primary care recommendation.   Have your pharmacy contact us with medicine refill requests until we can get you to primary care.    I appreciate the opportunity to care for you. Silvano Rusk, MD, [facility]

## 2018-04-28 NOTE — Assessment & Plan Note (Signed)
Last labs were ok

## 2018-05-11 ENCOUNTER — Telehealth: Payer: Self-pay | Admitting: Internal Medicine

## 2018-05-11 MED ORDER — BUDESONIDE 3 MG PO CPEP: 9.0000 mg | ORAL_CAPSULE | Freq: Every day | ORAL | 0 refills | Status: DC

## 2018-05-11 MED ORDER — MIRTAZAPINE 7.5 MG PO TABS: 7.5000 mg | ORAL_TABLET | Freq: Every day | ORAL | 2 refills | Status: DC

## 2018-05-11 MED ORDER — SERTRALINE HCL 25 MG PO TABS: 25.0000 mg | ORAL_TABLET | Freq: Every day | ORAL | 2 refills | Status: DC

## 2018-05-11 MED ORDER — HYDRALAZINE HCL 25 MG PO TABS: 25.0000 mg | ORAL_TABLET | Freq: Three times a day (TID) | ORAL | 2 refills | Status: DC

## 2018-05-11 MED ORDER — LOSARTAN POTASSIUM 25 MG PO TABS: 25.0000 mg | ORAL_TABLET | Freq: Every day | ORAL | 2 refills | Status: DC

## 2018-05-11 MED ORDER — DONEPEZIL HCL 5 MG PO TABS: 5.0000 mg | ORAL_TABLET | Freq: Every day | ORAL | 2 refills | Status: DC

## 2018-05-11 NOTE — Telephone Encounter (Signed)
Please advise Sir, thank you. 

## 2018-05-11 NOTE — Telephone Encounter (Signed)
[name] the caretaker called me back and said everything on her list other than the OTC products needs refilled. Per Dr Carlean Purl all were refilled x 3 months supply.

## 2018-05-11 NOTE — Telephone Encounter (Signed)
Tried to call her back and see what she needs refilled. Voice mail full. Called her daughter [name] and she said to call the caretaker Mortimer Fries for what she needs refilled. I got his voice mail and left him a message to call me back with the list of what she needs refilled. I did confirm with [name] that the pharmacy is the [facility] listed.

## 2018-05-11 NOTE — Telephone Encounter (Signed)
X 3 mos

## 2018-06-15 DIAGNOSIS — M79672 Pain in left foot: Secondary | ICD-10-CM | POA: Diagnosis not present

## 2018-06-15 DIAGNOSIS — S92352D Displaced fracture of fifth metatarsal bone, left foot, subsequent encounter for fracture with routine healing: Secondary | ICD-10-CM | POA: Diagnosis not present

## 2018-06-16 ENCOUNTER — Other Ambulatory Visit: Payer: Self-pay | Admitting: Family Medicine

## 2018-06-16 ENCOUNTER — Other Ambulatory Visit: Payer: Self-pay | Admitting: Internal Medicine

## 2018-06-16 NOTE — Telephone Encounter (Signed)
How many refills Sir? 

## 2018-06-16 NOTE — Telephone Encounter (Signed)
Refill x 3 months 

## 2018-07-01 DIAGNOSIS — R400 Somnolence: Secondary | ICD-10-CM | POA: Diagnosis not present

## 2018-07-01 DIAGNOSIS — Z23 Encounter for immunization: Secondary | ICD-10-CM | POA: Diagnosis not present

## 2018-07-01 DIAGNOSIS — I10 Essential (primary) hypertension: Secondary | ICD-10-CM | POA: Diagnosis not present

## 2018-07-01 DIAGNOSIS — R69 Illness, unspecified: Secondary | ICD-10-CM | POA: Diagnosis not present

## 2018-07-01 DIAGNOSIS — Z79899 Other long term (current) drug therapy: Secondary | ICD-10-CM | POA: Diagnosis not present

## 2018-07-01 DIAGNOSIS — R011 Cardiac murmur, unspecified: Secondary | ICD-10-CM | POA: Diagnosis not present

## 2018-07-06 ENCOUNTER — Encounter: Payer: Self-pay | Admitting: Neurology

## 2018-07-14 ENCOUNTER — Telehealth: Payer: Self-pay

## 2018-07-14 NOTE — Telephone Encounter (Signed)
[name] from [facility] left a message on VM stating she had a fracture in [place] and it was recommended that she have a DEXA within 6 months of the fracture and wanted to make sure Dr Damita Dunnings was going to order it. I left a message for her to advise that the pt is no longer under Dr Josefine Class care per 04-07-18 phone note.

## 2018-07-23 DIAGNOSIS — I509 Heart failure, unspecified: Secondary | ICD-10-CM | POA: Diagnosis not present

## 2018-07-23 DIAGNOSIS — M810 Age-related osteoporosis without current pathological fracture: Secondary | ICD-10-CM | POA: Diagnosis not present

## 2018-07-23 DIAGNOSIS — Z803 Family history of malignant neoplasm of breast: Secondary | ICD-10-CM | POA: Diagnosis not present

## 2018-07-23 DIAGNOSIS — K5090 Crohn's disease, unspecified, without complications: Secondary | ICD-10-CM | POA: Diagnosis not present

## 2018-07-23 DIAGNOSIS — Z7952 Long term (current) use of systemic steroids: Secondary | ICD-10-CM | POA: Diagnosis not present

## 2018-07-23 DIAGNOSIS — I110 Hypertensive heart disease with heart failure: Secondary | ICD-10-CM | POA: Diagnosis not present

## 2018-07-23 DIAGNOSIS — R69 Illness, unspecified: Secondary | ICD-10-CM | POA: Diagnosis not present

## 2018-07-23 DIAGNOSIS — R32 Unspecified urinary incontinence: Secondary | ICD-10-CM | POA: Diagnosis not present

## 2018-07-27 ENCOUNTER — Other Ambulatory Visit: Payer: Self-pay | Admitting: *Deleted

## 2018-07-27 MED ORDER — LOSARTAN POTASSIUM 25 MG PO TABS: 25.0000 mg | ORAL_TABLET | Freq: Every day | ORAL | 1 refills | Status: DC

## 2018-08-13 ENCOUNTER — Other Ambulatory Visit: Payer: Self-pay | Admitting: Internal Medicine

## 2018-08-13 ENCOUNTER — Other Ambulatory Visit: Payer: Self-pay | Admitting: Family Medicine

## 2018-08-13 NOTE — Telephone Encounter (Signed)
Refill x 3 mos Let her know I expect her to get a new PCP so they can start refilling these meds

## 2018-08-13 NOTE — Telephone Encounter (Signed)
Please advise Sir? 

## 2018-08-13 NOTE — Telephone Encounter (Signed)
Tried to reach [name] and her voicemail is full and not accepting new messages. I left her daughter [name] a detailed message about the refills and to try and get a PCP soon.

## 2018-08-14 ENCOUNTER — Other Ambulatory Visit: Payer: Self-pay

## 2018-08-14 NOTE — Telephone Encounter (Signed)
[name] the care taker called and he let me know that [name] got a PCP and she loves him- Dr Lajean Manes. I told him that I sent in refills yesterday for her. He said he had meant for those to go thru Dr Felipa Eth and next time they will.  Dr Carlean Purl informed of this information.

## 2018-08-20 DIAGNOSIS — R011 Cardiac murmur, unspecified: Secondary | ICD-10-CM | POA: Diagnosis not present

## 2018-08-20 DIAGNOSIS — L853 Xerosis cutis: Secondary | ICD-10-CM | POA: Diagnosis not present

## 2018-08-20 DIAGNOSIS — I872 Venous insufficiency (chronic) (peripheral): Secondary | ICD-10-CM | POA: Diagnosis not present

## 2018-08-20 DIAGNOSIS — L89151 Pressure ulcer of sacral region, stage 1: Secondary | ICD-10-CM | POA: Diagnosis not present

## 2018-08-21 ENCOUNTER — Other Ambulatory Visit (HOSPITAL_COMMUNITY): Payer: Self-pay | Admitting: Geriatric Medicine

## 2018-08-21 ENCOUNTER — Other Ambulatory Visit: Payer: Self-pay | Admitting: Geriatric Medicine

## 2018-08-21 DIAGNOSIS — R011 Cardiac murmur, unspecified: Secondary | ICD-10-CM

## 2018-08-24 ENCOUNTER — Other Ambulatory Visit: Payer: Self-pay

## 2018-08-24 ENCOUNTER — Ambulatory Visit (HOSPITAL_COMMUNITY): Payer: Medicare HMO | Attending: Cardiology

## 2018-08-24 DIAGNOSIS — R011 Cardiac murmur, unspecified: Secondary | ICD-10-CM | POA: Insufficient documentation

## 2018-08-28 ENCOUNTER — Telehealth: Payer: Self-pay

## 2018-08-28 NOTE — Telephone Encounter (Signed)
Called pt.  No answer.  VM box full.  Unable to leave message.   Called pt's daughter, [name]. No answer.  LMOM advising that we are limiting in-person office visits.  Asked that [facility] call office.

## 2018-08-31 ENCOUNTER — Encounter

## 2018-08-31 ENCOUNTER — Ambulatory Visit: Payer: Medicare HMO | Admitting: Neurology

## 2018-09-29 DIAGNOSIS — R269 Unspecified abnormalities of gait and mobility: Secondary | ICD-10-CM | POA: Diagnosis not present

## 2018-09-29 DIAGNOSIS — I10 Essential (primary) hypertension: Secondary | ICD-10-CM | POA: Diagnosis not present

## 2018-09-29 DIAGNOSIS — R69 Illness, unspecified: Secondary | ICD-10-CM | POA: Diagnosis not present

## 2018-10-07 DIAGNOSIS — Z4689 Encounter for fitting and adjustment of other specified devices: Secondary | ICD-10-CM | POA: Diagnosis not present

## 2018-10-07 DIAGNOSIS — R54 Age-related physical debility: Secondary | ICD-10-CM | POA: Diagnosis not present

## 2018-10-07 DIAGNOSIS — N813 Complete uterovaginal prolapse: Secondary | ICD-10-CM | POA: Diagnosis not present

## 2018-10-09 ENCOUNTER — Telehealth: Payer: Self-pay | Admitting: Neurology

## 2018-10-09 NOTE — Telephone Encounter (Signed)
noted 

## 2018-10-09 NOTE — Telephone Encounter (Signed)
Patient left VM about appt for today. I called back and son answered and said that patient is very confused he has told her multiple times she did not have appt until 11/09/18. He wanted all this documented for future. Just FYI. Thanks!

## 2018-10-16 ENCOUNTER — Other Ambulatory Visit: Payer: Self-pay | Admitting: Internal Medicine

## 2018-10-16 NOTE — Telephone Encounter (Signed)
Dr Carlean Purl. I wasn't sure on the refill for hydralazine HCI 25 mg tab 1 every 8 hours. I see where you filled for her once back in NOv 2019 while she was loooking for a PCP. Please advise. Thank you

## 2018-10-19 NOTE — Telephone Encounter (Signed)
Needs to go to PCP

## 2018-10-23 DIAGNOSIS — N3000 Acute cystitis without hematuria: Secondary | ICD-10-CM | POA: Diagnosis not present

## 2018-10-23 DIAGNOSIS — I10 Essential (primary) hypertension: Secondary | ICD-10-CM | POA: Diagnosis not present

## 2018-11-05 ENCOUNTER — Other Ambulatory Visit: Payer: Self-pay

## 2018-11-09 ENCOUNTER — Telehealth (INDEPENDENT_AMBULATORY_CARE_PROVIDER_SITE_OTHER): Payer: Medicare HMO | Admitting: Neurology

## 2018-11-09 ENCOUNTER — Other Ambulatory Visit: Payer: Self-pay

## 2018-11-09 DIAGNOSIS — F03A0 Unspecified dementia, mild, without behavioral disturbance, psychotic disturbance, mood disturbance, and anxiety: Secondary | ICD-10-CM

## 2018-11-09 DIAGNOSIS — F0390 Unspecified dementia without behavioral disturbance: Secondary | ICD-10-CM | POA: Diagnosis not present

## 2018-11-09 DIAGNOSIS — R69 Illness, unspecified: Secondary | ICD-10-CM | POA: Diagnosis not present

## 2018-11-09 NOTE — Progress Notes (Signed)
Virtual Visit via Video Note The purpose of this virtual visit is to provide medical care while limiting exposure to the novel coronavirus.    Consent was obtained for video visit:  Yes.   Answered questions that patient had about telehealth interaction:  Yes.   I discussed the limitations, risks, security and privacy concerns of performing an evaluation and management service by telemedicine. I also discussed with the patient that there may be a patient responsible charge related to this service. The patient expressed understanding and agreed to proceed.  Pt location: Home Physician Location: office Name of referring provider:  Lajean Manes, MD I connected with [name] at patients initiation/request on 11/09/2018 at 10:30 AM EDT by video enabled telemedicine application and verified that I am speaking with the correct person using two identifiers. Pt MRN:  [id number] Pt DOB:  1933-03-12 Video Participants:  [name];  Orlene Erm (Son)   History of Present Illness:  This is an 85 year old right-handed woman with a history of hypertension, hyperparathyroidism, CHF, anxiety, presenting for evaluation of dementia. Her son [name] is present during the e-visit to provide additional information. She feels her memory is fairly good. She had been living alone until she was admitted to the hospital in June 2019 for acute cystitis. Her son reports that she took a real challenge that summer "with mini-strokes or something that affected her." He also notes that she has been under an undue amount of stress from family issues. She was discharged to rehab, they report it was not a good situation for her there where she was diagnosed with dementia and started on Donepezil 5mg  daily. She went home where she now has a 24/7 caregiver, she is doing better health-wise but her memory has progressively worsened that it is now quite noticeable. She would repeat herself several times and be unable to  process answers. She would call him multiple times at his work. When she was living alone, she denies missing bills or medications, her son notes she was missing bill payments and he took over in June 2019. He states she had refused to take medications and he reports she has Crohn's disease, she denies this. Caregiver now administers medications. She had a fall in October 2019 which they believe is because she was not eating well. Caregiver helps with meals and assists with bathing. She has not been driving since June 2130. Her son is concerned she is not competent enough to make decisions. One of her daughters has taken over finances, but there have been several occasions that she has asked the caregiver to take her to withdraw money. [name] would like there to be a safeguard in effect so that her mental status would not be taken advantage of. [name] reports that she is very anxious and "lives life with constant angst." This has been a chronic issue. She was previously on a higher dose of Sertraline which made her drowsy, currently on 25mg  daily. She was also previously on mirtazapine which has been discontinued. She gets easily irritated when people tell her something different from what she thinks and "can dig her heels in." No paranoia or hallucinations. Sleep is good, no wandering behavior. No family history of dementia. No history of significant head injuries or alcohol use. She denies any headaches, dizziness, vision changes, focal numbness/tingling/weakness, neck/back pain, anosmia, or tremors. She has urinary and bowel incontinence at times.   PAST MEDICAL HISTORY: Past Medical History:  Diagnosis Date  .  Acute diastolic (congestive) heart failure ([place])   . Bladder prolapse, female, acquired    pessary in  . Chronic diastolic (congestive) heart failure ([place])   . Chronic pain syndrome   . Complete uterine prolapse   . Crohn's disease ([place]) 2010  . Diverticulosis   . E. coli UTI 06/09/2013  . Fall  05/2013  . Hemorrhoids   . History of hypercalcemia   . History of hyperglycemia   . Hydronephrosis   . Hyperparathyroidism   . Hypertension   . Multiple pelvic fractures 07/02/2013  . Osteoporosis   . Pubic ramus fracture ([place])   . Urinary incontinence 09/09/2011  . Vitamin D deficiency     PAST SURGICAL HISTORY: Past Surgical History:  Procedure Laterality Date  . COLONOSCOPY  12/23/2008   crohn's, diverticulosis, internal hemorrhoids  . HIP FRACTURE SURGERY Left    left, prosthesis  . LAPAROSCOPY  10/2009  . PARATHYROIDECTOMY Left 08/30/2013   Procedure: LEFT INFERIOR PARATHYROIDECTOMY;  Surgeon: Earnstine Regal, MD;  Location: [facility];  Service: General;  Laterality: Left;  . RHINOPLASTY    . TONSILLECTOMY      MEDICATIONS: Current Outpatient Medications on File Prior to Visit  Medication Sig Dispense Refill  . budesonide (ENTOCORT EC) 3 MG 24 hr capsule TAKE 3 CAPSULES BY MOUTH ONCE DAILY 90 capsule 0  . calcium-vitamin D (OSCAL WITH D) 500-200 MG-UNIT tablet Take 1 tablet by mouth 2 (two) times daily.    Marland Kitchen donepezil (ARICEPT) 5 MG tablet TAKE 1 TABLET BY MOUTH AT BEDTIME 30 tablet 2  . hydrALAZINE (APRESOLINE) 25 MG tablet Take 1 tablet (25 mg total) by mouth every 8 (eight) hours. 90 tablet 2  . losartan (COZAAR) 25 MG tablet Take 1 tablet by mouth once daily 30 tablet 2  . Multiple Vitamin (MULTIVITAMIN) tablet Take 1 tablet by mouth daily.    . sertraline (ZOLOFT) 25 MG tablet Take 1 tablet by mouth once daily 30 tablet 2   No current facility-administered medications on file prior to visit.     ALLERGIES: Allergies  Allergen Reactions  . Other     FAMILY HISTORY: Family History  Problem Relation Age of Onset  . Heart disease Father   . Colon cancer Father   . Osteoporosis Mother   . Breast cancer Mother   . Prostate cancer Brother      Observations/Objective:   GEN:  The patient appears stated age and is in NAD.  Neurological examination: Patient is  awake, alert, oriented x 3. No aphasia or dysarthria. Reduced fluency. Intactcomprehension. Remote and recent memory impaired. Able to name and repeat. Cranial nerves: Extraocular movements intact with no nystagmus. No facial asymmetry. Motor: moves all extremities symmetrically, at least anti-gravity x 4. No incoordination on finger to nose testing. Gait: narrow-based and steady, able to tandem walk adequately. Negative Romberg test.  Montreal Cognitive Assessment  11/16/2018  Visuospatial/ Executive (0/5) 4  Naming (0/3) 3  Attention: Read list of digits (0/2) 2  Attention: Read list of letters (0/1) 1  Attention: Serial 7 subtraction starting at 100 (0/3) 1  Language: Repeat phrase (0/2) 2  Language : Fluency (0/1) 0  Abstraction (0/2) 2  Delayed Recall (0/5) 1  Orientation (0/6) 5  Total 21    Assessment and Plan:   This is an 85 year old right-handed woman with a history of hypertension, hyperparathyroidism, CHF, anxiety, presenting for evaluation of dementia. Her neurological exam today is non-focal, MOCA score 21/30. Symptoms suggestive of  mild dementia, likely Alzheimer's disease. MRI brain without contrast will be ordered to assess for underlying structural abnormality. Increase Donepezil to 10mg  daily. Her son is requesting for a competency evaluation, resources will be provided. I discussed the diagnosis of dementia, we discussed the importance of continued close supervision, and eventual need potentially for higher level of care (ALF/Memory care), which she is understandably resistant to. She does not drive. Follow-up in 6 months, they know to call for any changes.   Follow Up Instructions:   -I discussed the assessment and treatment plan with the patient/son. The patient/son were provided an opportunity to ask questions and all were answered. The patient/son agreed with the plan and demonstrated an understanding of the instructions.   The patient was advised to call back or seek an  in-person evaluation if the symptoms worsen or if the condition fails to improve as anticipated.   Cameron Sprang, MD

## 2018-11-10 ENCOUNTER — Other Ambulatory Visit: Payer: Self-pay | Admitting: Internal Medicine

## 2018-11-10 NOTE — Telephone Encounter (Signed)
Would you like her PCP to do this Sir?

## 2018-11-11 NOTE — Telephone Encounter (Signed)
Yes

## 2018-11-13 ENCOUNTER — Telehealth: Payer: Self-pay | Admitting: Internal Medicine

## 2018-11-13 NOTE — Telephone Encounter (Signed)
All non-GI meds need to go through PCP

## 2018-11-13 NOTE — Telephone Encounter (Signed)
Left detailed message for the care giver Diane to ask for those thru PCP.

## 2018-11-13 NOTE — Telephone Encounter (Signed)
Please advise Sir? 

## 2018-11-13 NOTE — Telephone Encounter (Signed)
Patient needs refills on medications donepezil, hydralazine and zoloft per care giver Diane. Patient uses [facility].

## 2018-11-16 ENCOUNTER — Other Ambulatory Visit: Payer: Self-pay

## 2018-11-16 DIAGNOSIS — F0390 Unspecified dementia without behavioral disturbance: Secondary | ICD-10-CM

## 2018-11-16 DIAGNOSIS — F03A0 Unspecified dementia, mild, without behavioral disturbance, psychotic disturbance, mood disturbance, and anxiety: Secondary | ICD-10-CM

## 2018-11-16 MED ORDER — BUDESONIDE 3 MG PO CPEP: 9.0000 mg | ORAL_CAPSULE | Freq: Every day | ORAL | 3 refills | Status: AC

## 2018-11-16 MED ORDER — DONEPEZIL HCL 10 MG PO TABS: ORAL_TABLET | ORAL | 3 refills | Status: AC

## 2018-11-16 NOTE — Telephone Encounter (Signed)
The medication they requested Friday was budesonide. They did not get it. Is frustrated because they went to the pharmacy three times.

## 2018-11-16 NOTE — Telephone Encounter (Signed)
I spoke with [name] (daughter) and told her we didn't know she needed the budesonide Friday but I refilled it while we were on the phone. She is going to follow up with Dr Felipa Eth about the other meds.

## 2018-11-16 NOTE — Addendum Note (Signed)
Addended by: [place], Susanna Benge E on: 11/16/2018 02:09 PM   Modules accepted: Orders

## 2018-12-15 ENCOUNTER — Other Ambulatory Visit: Payer: Self-pay | Admitting: Family Medicine

## 2019-01-01 DIAGNOSIS — I10 Essential (primary) hypertension: Secondary | ICD-10-CM | POA: Diagnosis not present

## 2019-01-01 DIAGNOSIS — L299 Pruritus, unspecified: Secondary | ICD-10-CM | POA: Diagnosis not present

## 2019-01-01 DIAGNOSIS — K50118 Crohn's disease of large intestine with other complication: Secondary | ICD-10-CM | POA: Diagnosis not present

## 2019-01-06 DIAGNOSIS — R69 Illness, unspecified: Secondary | ICD-10-CM | POA: Diagnosis not present

## 2019-01-06 DIAGNOSIS — K50914 Crohn's disease, unspecified, with abscess: Secondary | ICD-10-CM | POA: Diagnosis not present

## 2019-01-06 DIAGNOSIS — Z79899 Other long term (current) drug therapy: Secondary | ICD-10-CM | POA: Diagnosis not present

## 2019-01-06 DIAGNOSIS — I10 Essential (primary) hypertension: Secondary | ICD-10-CM | POA: Diagnosis not present

## 2019-02-21 ENCOUNTER — Other Ambulatory Visit: Payer: Self-pay | Admitting: Family Medicine

## 2019-02-23 ENCOUNTER — Other Ambulatory Visit: Payer: Self-pay | Admitting: *Deleted

## 2019-02-23 ENCOUNTER — Telehealth: Payer: Self-pay | Admitting: Internal Medicine

## 2019-02-23 NOTE — Telephone Encounter (Signed)
Spoke with her daughter [name] and she will get the caregiver to call Dr Carlyle Lipa office tomorrow. We had refilled when she didn't have a PCP so [name] said that is probably why Mom called [place].

## 2019-02-23 NOTE — Telephone Encounter (Signed)
Faxed refill request. Sertraline 25 mg Last office visit:   01/19/2018  No upcoming appts scheduled Last Filled:   By Silvano Rusk, MD 30 tablet 2 08/13/2018  Please advise.

## 2019-02-23 NOTE — Telephone Encounter (Signed)
Needs to get from PCP or whomever is rxing

## 2019-02-23 NOTE — Telephone Encounter (Signed)
Please advise Sir, thank you. 

## 2019-02-23 NOTE — Telephone Encounter (Signed)
Pt needs rf for Sertraline sent to [facility]. She is requesting a 3 month-supply if possible due to cheaper copay.

## 2019-04-21 DIAGNOSIS — N8111 Cystocele, midline: Secondary | ICD-10-CM | POA: Diagnosis not present

## 2019-05-17 DIAGNOSIS — I10 Essential (primary) hypertension: Secondary | ICD-10-CM | POA: Diagnosis not present

## 2019-05-17 DIAGNOSIS — Z23 Encounter for immunization: Secondary | ICD-10-CM | POA: Diagnosis not present

## 2019-05-17 DIAGNOSIS — J3089 Other allergic rhinitis: Secondary | ICD-10-CM | POA: Diagnosis not present

## 2019-05-17 DIAGNOSIS — Z0000 Encounter for general adult medical examination without abnormal findings: Secondary | ICD-10-CM | POA: Diagnosis not present

## 2019-07-12 DIAGNOSIS — Z0000 Encounter for general adult medical examination without abnormal findings: Secondary | ICD-10-CM | POA: Diagnosis not present

## 2019-07-12 DIAGNOSIS — I10 Essential (primary) hypertension: Secondary | ICD-10-CM | POA: Diagnosis not present

## 2019-07-12 DIAGNOSIS — K50118 Crohn's disease of large intestine with other complication: Secondary | ICD-10-CM | POA: Diagnosis not present

## 2019-07-12 DIAGNOSIS — Z79899 Other long term (current) drug therapy: Secondary | ICD-10-CM | POA: Diagnosis not present

## 2019-07-12 DIAGNOSIS — H6122 Impacted cerumen, left ear: Secondary | ICD-10-CM | POA: Diagnosis not present

## 2019-07-12 DIAGNOSIS — G301 Alzheimer's disease with late onset: Secondary | ICD-10-CM | POA: Diagnosis not present

## 2019-07-12 DIAGNOSIS — Z1389 Encounter for screening for other disorder: Secondary | ICD-10-CM | POA: Diagnosis not present

## 2019-07-14 ENCOUNTER — Other Ambulatory Visit: Payer: Self-pay

## 2019-07-14 NOTE — Patient Outreach (Signed)
Screening:  New referral from MD office for social worker assistance for caregiver services due to dementia. Spoke with patients [name] who is health care power of attorney.  Mr. [name] reports patient is running out of money for private caregivers. Reports currently has 24/7 care. Family is concerned about need for assistance with incontinence supplies, additional caregivers and medicaid process.   PLAN: no nursing needs at this time. Will refer to [facility] social worker.  Reviewed plan with son and he is in agreement.  Caitlyn Rand, RN, BSN, CEN [facility] [employer] [phone]

## 2019-07-15 IMAGING — CT CT ABD-PELV W/ CM
2 of 5 series · 16 of 46 positions shown, 18 images · IV contrast (Omni 300)
Comparison: 11/02/2009 CT abdomen and pelvis.

CLINICAL DATA: 84 y/o F; generalized weakness, nausea, vomiting,
diarrhea. History of Crohn's.

EXAM:
CT ABDOMEN AND PELVIS WITH CONTRAST
TECHNIQUE: Multidetector CT imaging of the abdomen and pelvis was performed
using the standard protocol following bolus administration of
intravenous contrast.
CONTRAST:  100mL OMNIPAQUE IOHEXOL 300 MG/ML  SOLN

[Series 3: a/p w/ 5mm · axial · 0.64mm/px · z∈[+843,+1198]mm · 13 of 81 slices shown, 15 images]
[im 5/81  soft-tissue]
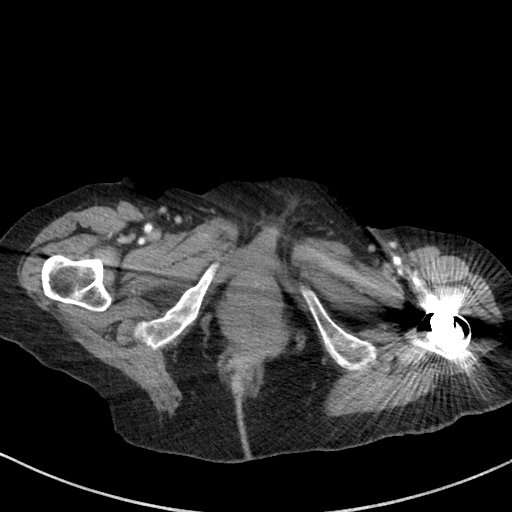
[im 5/81  bone]
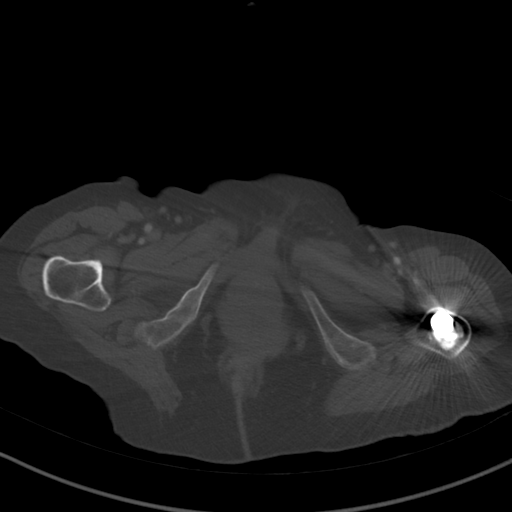
[im 13/81  soft-tissue]
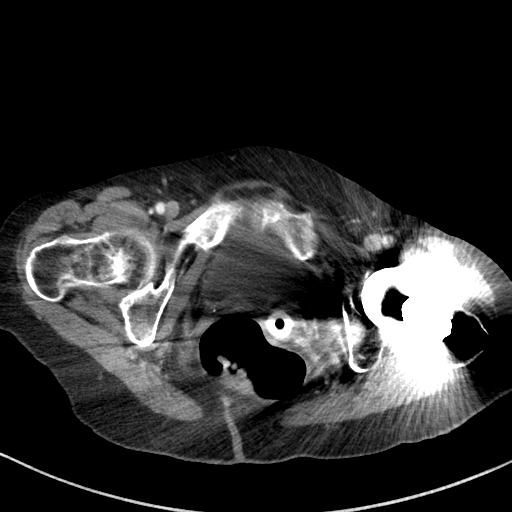
[im 17/81  soft-tissue]
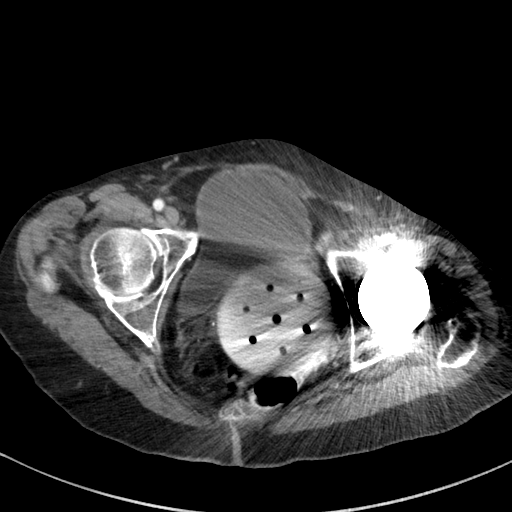
[im 22/81  soft-tissue]
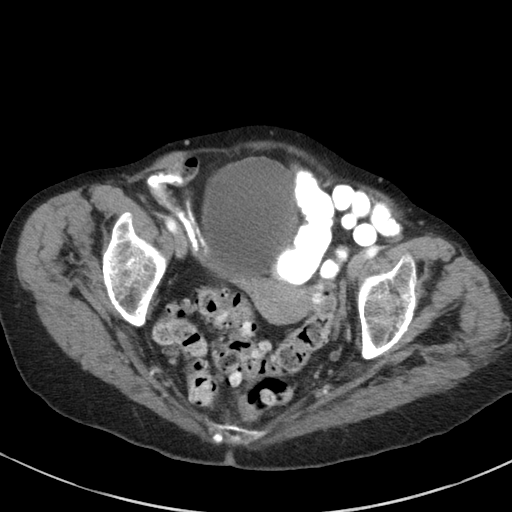
[im 30/81  soft-tissue]
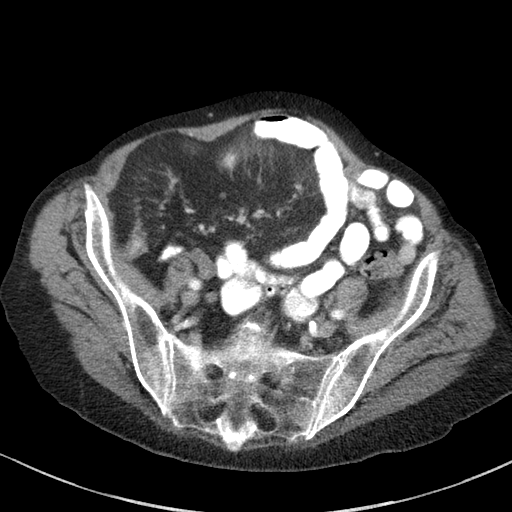
[im 34/81  soft-tissue]
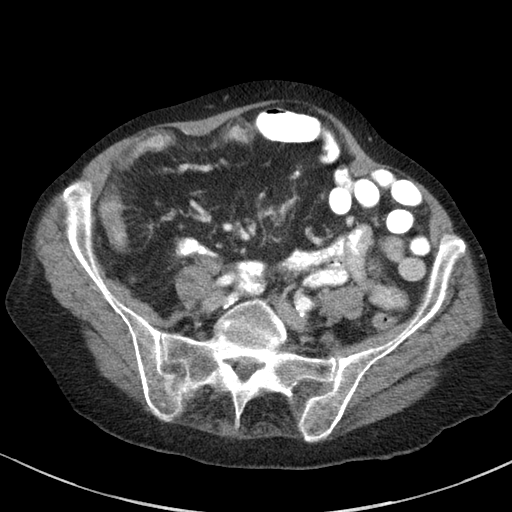
[im 43/81  soft-tissue]
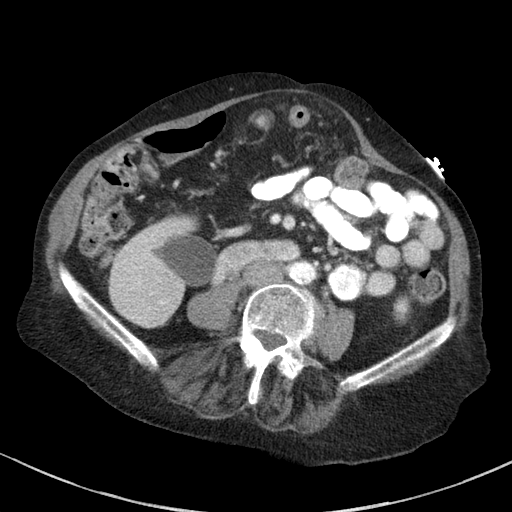
[im 47/81  soft-tissue]
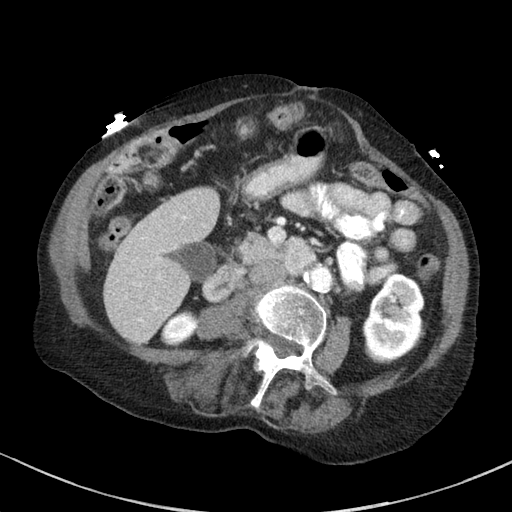
[im 51/81  soft-tissue]
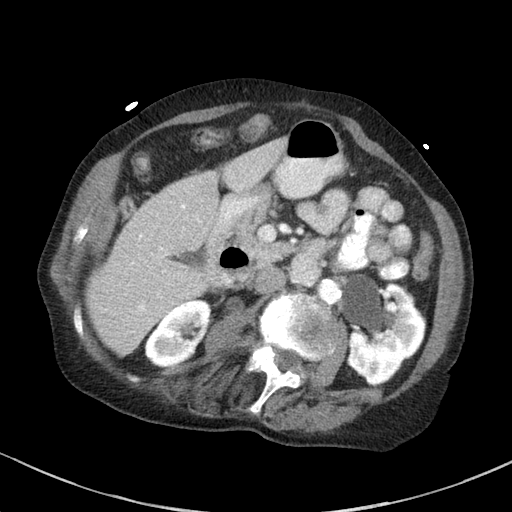
[im 51/81  bone]
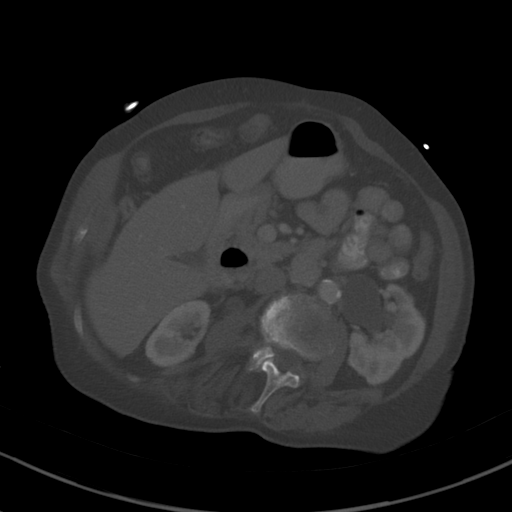
[im 59/81  soft-tissue]
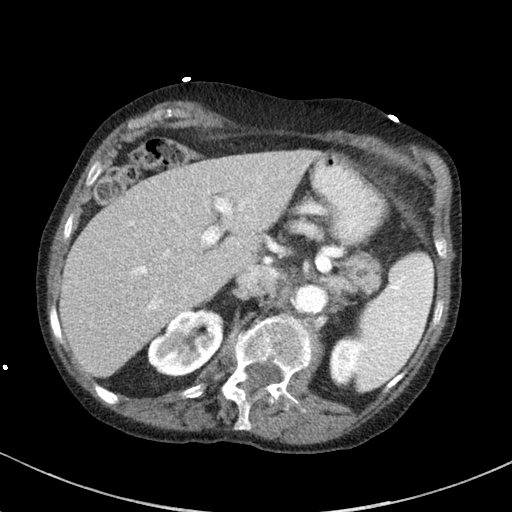
[im 64/81  soft-tissue]
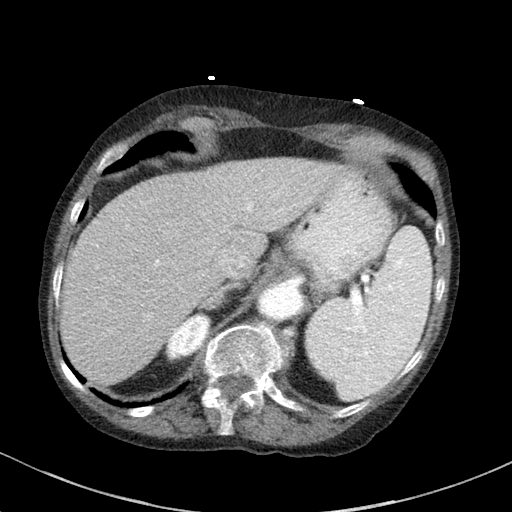
[im 68/81  soft-tissue]
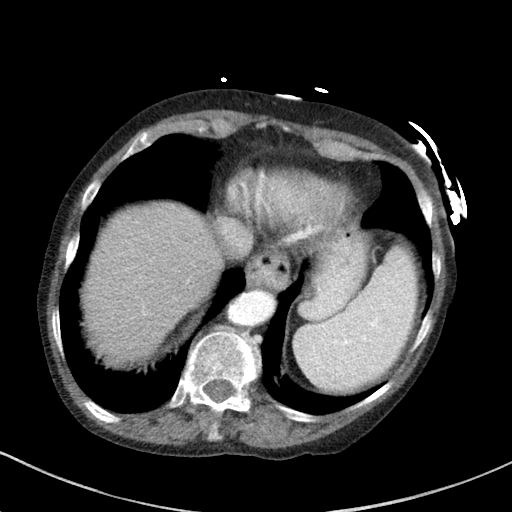
[im 76/81  soft-tissue]
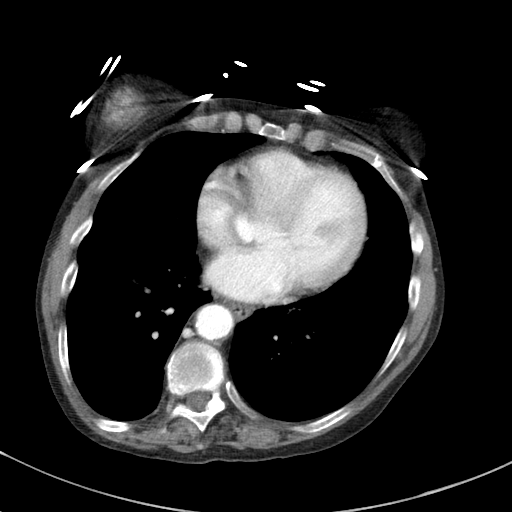

[Series 6: a/p w/ cor · coronal · 0.63mm/px · 3 of 123 slices shown]
[im 41/123  soft-tissue]
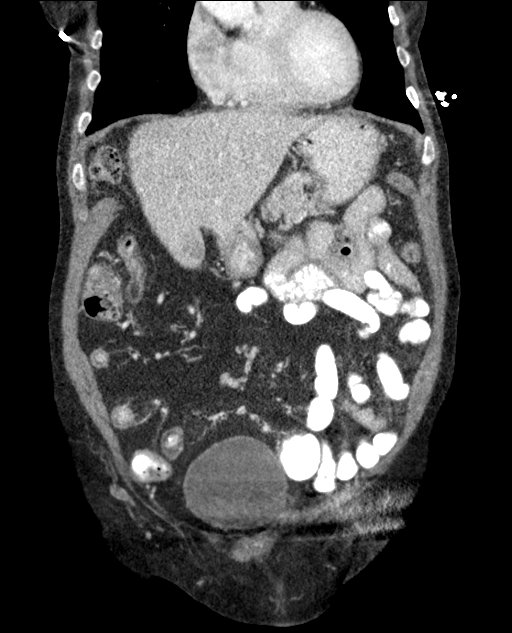
[im 55/123  soft-tissue]
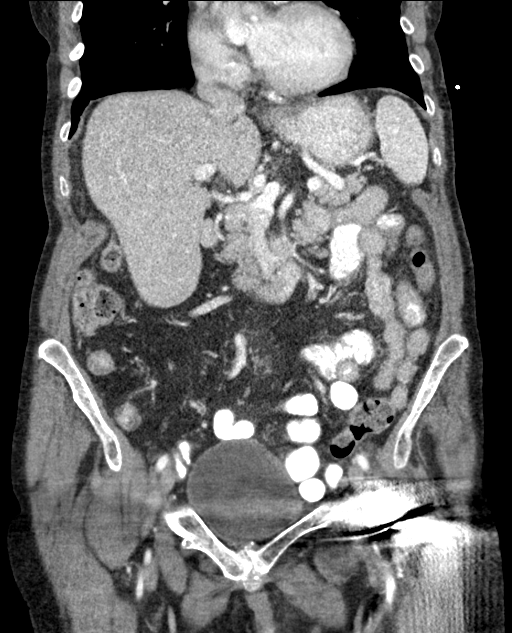
[im 68/123  soft-tissue]
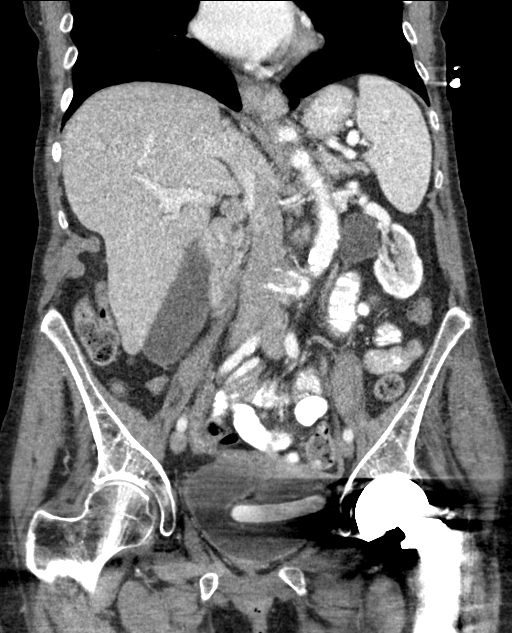

[16 of 46 positions shown; findings below may reference images not displayed]

FINDINGS: Lower chest: Bilateral breast prostheses. Bilateral lower lobe
platelike atelectasis. Small hiatal hernia.

Hepatobiliary: No focal liver abnormality is seen. No gallstones,
gallbladder wall thickening, or biliary dilatation.

Pancreas: Unremarkable. No pancreatic ductal dilatation or
surrounding inflammatory changes.

Spleen: Normal in size without focal abnormality.

Adrenals/Urinary Tract: Adrenal glands are unremarkable. Kidneys are
normal, without renal calculi, focal lesion, or hydronephrosis.
Bladder is unremarkable.

Stomach/Bowel: There is a short segment of wall thickening
enhancement of distal small bowel in the lower abdomen compatible
with acute inflammation (series 3, image 55). There is a downstream
segment of stricture without thickening compatible with chronic
sequelae of Crohn's. Additionally, there is diffuse fatty wall
thickening without stricture of small bowel in the right hemiabdomen
compatible with chronic inflammatory change. No obstructive or
inflammatory changes of the colon. Sigmoid diverticulosis without
findings of acute diverticulitis.

Vascular/Lymphatic: Aortic atherosclerosis. No enlarged abdominal or
pelvic lymph nodes.

Reproductive: Uterus and bilateral adnexa are unremarkable.

Other: No abdominal wall hernia or abnormality. No abdominopelvic
ascites.

Musculoskeletal: Moderate levocurvature of the spine with apex at
L2. No acute osseous abnormality identified.
IMPRESSION: 1. Acute inflammation of short segment of small bowel in the lower
abdomen. Downstream segment of stricture without inflammation
compatible with fibrostenotic chronic Crohn's. No fistula
identified.
2. Diffuse fatty wall thickening of small bowel in the right
hemiabdomen indicating chronic inflammatory change.
3. Sigmoid diverticulosis without findings of acute diverticulitis.
4. Small hiatal hernia.

By: Dleke Tiger M.D.

## 2019-07-22 DIAGNOSIS — I110 Hypertensive heart disease with heart failure: Secondary | ICD-10-CM | POA: Diagnosis not present

## 2019-07-22 DIAGNOSIS — I509 Heart failure, unspecified: Secondary | ICD-10-CM | POA: Diagnosis not present

## 2019-07-22 DIAGNOSIS — I739 Peripheral vascular disease, unspecified: Secondary | ICD-10-CM | POA: Diagnosis not present

## 2019-07-22 DIAGNOSIS — Z7951 Long term (current) use of inhaled steroids: Secondary | ICD-10-CM | POA: Diagnosis not present

## 2019-07-22 DIAGNOSIS — Z7409 Other reduced mobility: Secondary | ICD-10-CM | POA: Diagnosis not present

## 2019-07-22 DIAGNOSIS — R32 Unspecified urinary incontinence: Secondary | ICD-10-CM | POA: Diagnosis not present

## 2019-07-22 DIAGNOSIS — Z008 Encounter for other general examination: Secondary | ICD-10-CM | POA: Diagnosis not present

## 2019-07-22 DIAGNOSIS — J309 Allergic rhinitis, unspecified: Secondary | ICD-10-CM | POA: Diagnosis not present

## 2019-07-22 DIAGNOSIS — R69 Illness, unspecified: Secondary | ICD-10-CM | POA: Diagnosis not present

## 2019-07-23 DIAGNOSIS — N3281 Overactive bladder: Secondary | ICD-10-CM | POA: Diagnosis not present

## 2019-07-23 DIAGNOSIS — N813 Complete uterovaginal prolapse: Secondary | ICD-10-CM | POA: Diagnosis not present

## 2019-07-23 DIAGNOSIS — Z4689 Encounter for fitting and adjustment of other specified devices: Secondary | ICD-10-CM | POA: Diagnosis not present

## 2019-07-23 DIAGNOSIS — N952 Postmenopausal atrophic vaginitis: Secondary | ICD-10-CM | POA: Diagnosis not present

## 2019-10-26 DIAGNOSIS — Z4689 Encounter for fitting and adjustment of other specified devices: Secondary | ICD-10-CM | POA: Diagnosis not present

## 2019-10-26 DIAGNOSIS — N3281 Overactive bladder: Secondary | ICD-10-CM | POA: Diagnosis not present

## 2019-10-26 DIAGNOSIS — R82998 Other abnormal findings in urine: Secondary | ICD-10-CM | POA: Diagnosis not present

## 2019-10-26 DIAGNOSIS — N813 Complete uterovaginal prolapse: Secondary | ICD-10-CM | POA: Diagnosis not present

## 2019-11-29 ENCOUNTER — Telehealth: Payer: Self-pay | Admitting: Neurology

## 2019-11-29 NOTE — Telephone Encounter (Signed)
I haven't seen her in a year, on my note he was asking for a competency evaluation, if this is the information he meant, pls let him know I sent him Dr. Leona Carry information on MyChart, looks like he read it. Pls provide him the info: Dr. Marylynn Pearson ([place]) [phone]. Thanks!

## 2019-11-29 NOTE — Telephone Encounter (Signed)
Son notified

## 2019-11-29 NOTE — Telephone Encounter (Signed)
Patient's son called in wanting to find out about some paperwork that was talked about at the patient's last visit. He is not sure if it was ever mailed to him due to him moving and having some issues getting some mail. The paperwork was to protect the patient from having to make decisions she is not capable of making. Something about unwilling to comply. He would like to pick up that paperwork in our office once it's ready.

## 2019-12-29 ENCOUNTER — Other Ambulatory Visit: Payer: Self-pay | Admitting: Internal Medicine

## 2019-12-29 NOTE — Telephone Encounter (Signed)
Left message for her daughter [name] to call me back to discuss this.

## 2019-12-29 NOTE — Telephone Encounter (Signed)
No refill Not seen since 2019 Needs an office visit or try PCP

## 2019-12-29 NOTE — Telephone Encounter (Signed)
May I refill Sir? Thank you.

## 2019-12-31 NOTE — Telephone Encounter (Signed)
Spoke with her son [name] and appointment has been made for 01/25/2020 at 2:50pm

## 2020-01-18 ENCOUNTER — Inpatient Hospital Stay (HOSPITAL_COMMUNITY)
Admission: EM | Admit: 2020-01-18 | Discharge: 2020-01-27 | DRG: 184 | Disposition: A | Discharge status: Skilled Nursing Facility | Payer: Medicare HMO | Attending: General Surgery | Admitting: General Surgery

## 2020-01-18 ENCOUNTER — Emergency Department (HOSPITAL_COMMUNITY): Payer: Medicare HMO

## 2020-01-18 ENCOUNTER — Encounter (HOSPITAL_COMMUNITY): Payer: Self-pay

## 2020-01-18 ENCOUNTER — Other Ambulatory Visit: Payer: Self-pay

## 2020-01-18 DIAGNOSIS — R410 Disorientation, unspecified: Secondary | ICD-10-CM | POA: Diagnosis not present

## 2020-01-18 DIAGNOSIS — K5090 Crohn's disease, unspecified, without complications: Secondary | ICD-10-CM | POA: Diagnosis present

## 2020-01-18 DIAGNOSIS — R41841 Cognitive communication deficit: Secondary | ICD-10-CM | POA: Diagnosis not present

## 2020-01-18 DIAGNOSIS — I5032 Chronic diastolic (congestive) heart failure: Secondary | ICD-10-CM | POA: Diagnosis not present

## 2020-01-18 DIAGNOSIS — M810 Age-related osteoporosis without current pathological fracture: Secondary | ICD-10-CM | POA: Diagnosis present

## 2020-01-18 DIAGNOSIS — I7090 Unspecified atherosclerosis: Secondary | ICD-10-CM | POA: Diagnosis not present

## 2020-01-18 DIAGNOSIS — I110 Hypertensive heart disease with heart failure: Secondary | ICD-10-CM | POA: Diagnosis present

## 2020-01-18 DIAGNOSIS — S199XXA Unspecified injury of neck, initial encounter: Secondary | ICD-10-CM | POA: Diagnosis not present

## 2020-01-18 DIAGNOSIS — S42101A Fracture of unspecified part of scapula, right shoulder, initial encounter for closed fracture: Secondary | ICD-10-CM | POA: Diagnosis present

## 2020-01-18 DIAGNOSIS — Z8249 Family history of ischemic heart disease and other diseases of the circulatory system: Secondary | ICD-10-CM

## 2020-01-18 DIAGNOSIS — I10 Essential (primary) hypertension: Secondary | ICD-10-CM | POA: Diagnosis not present

## 2020-01-18 DIAGNOSIS — S2231XA Fracture of one rib, right side, initial encounter for closed fracture: Secondary | ICD-10-CM | POA: Diagnosis not present

## 2020-01-18 DIAGNOSIS — F0390 Unspecified dementia without behavioral disturbance: Secondary | ICD-10-CM | POA: Diagnosis present

## 2020-01-18 DIAGNOSIS — I6529 Occlusion and stenosis of unspecified carotid artery: Secondary | ICD-10-CM | POA: Diagnosis not present

## 2020-01-18 DIAGNOSIS — W1830XA Fall on same level, unspecified, initial encounter: Secondary | ICD-10-CM | POA: Diagnosis present

## 2020-01-18 DIAGNOSIS — M79603 Pain in arm, unspecified: Secondary | ICD-10-CM | POA: Diagnosis not present

## 2020-01-18 DIAGNOSIS — R52 Pain, unspecified: Secondary | ICD-10-CM | POA: Diagnosis not present

## 2020-01-18 DIAGNOSIS — Z79899 Other long term (current) drug therapy: Secondary | ICD-10-CM | POA: Diagnosis not present

## 2020-01-18 DIAGNOSIS — E213 Hyperparathyroidism, unspecified: Secondary | ICD-10-CM | POA: Diagnosis present

## 2020-01-18 DIAGNOSIS — S2241XA Multiple fractures of ribs, right side, initial encounter for closed fracture: Secondary | ICD-10-CM | POA: Diagnosis not present

## 2020-01-18 DIAGNOSIS — R109 Unspecified abdominal pain: Secondary | ICD-10-CM | POA: Diagnosis not present

## 2020-01-18 DIAGNOSIS — J90 Pleural effusion, not elsewhere classified: Secondary | ICD-10-CM | POA: Diagnosis not present

## 2020-01-18 DIAGNOSIS — R2681 Unsteadiness on feet: Secondary | ICD-10-CM | POA: Diagnosis not present

## 2020-01-18 DIAGNOSIS — W19XXXA Unspecified fall, initial encounter: Secondary | ICD-10-CM

## 2020-01-18 DIAGNOSIS — R278 Other lack of coordination: Secondary | ICD-10-CM | POA: Diagnosis not present

## 2020-01-18 DIAGNOSIS — S42111A Displaced fracture of body of scapula, right shoulder, initial encounter for closed fracture: Secondary | ICD-10-CM | POA: Diagnosis not present

## 2020-01-18 DIAGNOSIS — Z20822 Contact with and (suspected) exposure to covid-19: Secondary | ICD-10-CM | POA: Diagnosis present

## 2020-01-18 DIAGNOSIS — S0990XA Unspecified injury of head, initial encounter: Secondary | ICD-10-CM | POA: Diagnosis not present

## 2020-01-18 DIAGNOSIS — R0902 Hypoxemia: Secondary | ICD-10-CM | POA: Diagnosis not present

## 2020-01-18 DIAGNOSIS — R69 Illness, unspecified: Secondary | ICD-10-CM | POA: Diagnosis not present

## 2020-01-18 DIAGNOSIS — Z87440 Personal history of urinary (tract) infections: Secondary | ICD-10-CM

## 2020-01-18 DIAGNOSIS — E559 Vitamin D deficiency, unspecified: Secondary | ICD-10-CM | POA: Diagnosis present

## 2020-01-18 DIAGNOSIS — Y92009 Unspecified place in unspecified non-institutional (private) residence as the place of occurrence of the external cause: Secondary | ICD-10-CM | POA: Diagnosis not present

## 2020-01-18 DIAGNOSIS — R079 Chest pain, unspecified: Secondary | ICD-10-CM | POA: Diagnosis present

## 2020-01-18 DIAGNOSIS — Z8262 Family history of osteoporosis: Secondary | ICD-10-CM | POA: Diagnosis not present

## 2020-01-18 DIAGNOSIS — R296 Repeated falls: Secondary | ICD-10-CM | POA: Diagnosis not present

## 2020-01-18 DIAGNOSIS — S42101D Fracture of unspecified part of scapula, right shoulder, subsequent encounter for fracture with routine healing: Secondary | ICD-10-CM | POA: Diagnosis not present

## 2020-01-18 DIAGNOSIS — S2241XD Multiple fractures of ribs, right side, subsequent encounter for fracture with routine healing: Secondary | ICD-10-CM | POA: Diagnosis not present

## 2020-01-18 DIAGNOSIS — G894 Chronic pain syndrome: Secondary | ICD-10-CM | POA: Diagnosis not present

## 2020-01-18 DIAGNOSIS — U071 COVID-19: Secondary | ICD-10-CM | POA: Diagnosis not present

## 2020-01-18 DIAGNOSIS — M6281 Muscle weakness (generalized): Secondary | ICD-10-CM | POA: Diagnosis not present

## 2020-01-18 DIAGNOSIS — Z7401 Bed confinement status: Secondary | ICD-10-CM | POA: Diagnosis not present

## 2020-01-18 DIAGNOSIS — S270XXA Traumatic pneumothorax, initial encounter: Secondary | ICD-10-CM | POA: Diagnosis not present

## 2020-01-18 DIAGNOSIS — R633 Feeding difficulties, unspecified: Secondary | ICD-10-CM | POA: Diagnosis not present

## 2020-01-18 DIAGNOSIS — Z4789 Encounter for other orthopedic aftercare: Secondary | ICD-10-CM | POA: Diagnosis not present

## 2020-01-18 DIAGNOSIS — S2249XA Multiple fractures of ribs, unspecified side, initial encounter for closed fracture: Secondary | ICD-10-CM | POA: Diagnosis present

## 2020-01-18 DIAGNOSIS — M2550 Pain in unspecified joint: Secondary | ICD-10-CM | POA: Diagnosis not present

## 2020-01-18 DIAGNOSIS — R2689 Other abnormalities of gait and mobility: Secondary | ICD-10-CM | POA: Diagnosis not present

## 2020-01-18 DIAGNOSIS — J9811 Atelectasis: Secondary | ICD-10-CM | POA: Diagnosis not present

## 2020-01-18 LAB — BASIC METABOLIC PANEL
Anion gap: 10 (ref 5–15)
BUN: 24 mg/dL — ABNORMAL HIGH (ref 8–23)
CO2: 23 mmol/L (ref 22–32)
Calcium: 9.3 mg/dL (ref 8.9–10.3)
Chloride: 107 mmol/L (ref 98–111)
Creatinine, Ser: 0.76 mg/dL (ref 0.44–1.00)
GFR calc Af Amer: >60 mL/min (ref >60)
GFR calc non Af Amer: >60 mL/min (ref >60)
Glucose, Bld: 117 mg/dL — ABNORMAL HIGH (ref 70–99)
Potassium: 4.1 mmol/L (ref 3.5–5.1)
Sodium: 140 mmol/L (ref 135–145)

## 2020-01-18 LAB — CBC WITH DIFFERENTIAL/PLATELET
Abs Immature Granulocytes: 0.03 10*3/uL (ref 0.00–0.07)
Basophils Absolute: 0 10*3/uL (ref 0.0–0.1)
Basophils Relative: 0 %
Eosinophils Absolute: 0 10*3/uL (ref 0.0–0.5)
Eosinophils Relative: 0 %
HCT: 44.2 % (ref 36.0–46.0)
Hemoglobin: 15.2 g/dL — ABNORMAL HIGH (ref 12.0–15.0)
Immature Granulocytes: 0 %
Lymphocytes Relative: 8 %
Lymphs Abs: 0.7 10*3/uL (ref 0.7–4.0)
MCH: 32 pg (ref 26.0–34.0)
MCHC: 34.4 g/dL (ref 30.0–36.0)
MCV: 93.1 fL (ref 80.0–100.0)
Monocytes Absolute: 0.6 10*3/uL (ref 0.1–1.0)
Monocytes Relative: 7 %
Neutro Abs: 7.4 10*3/uL (ref 1.7–7.7)
Neutrophils Relative %: 85 %
Platelets: 209 10*3/uL (ref 150–400)
RBC: 4.75 MIL/uL (ref 3.87–5.11)
RDW: 12.4 % (ref 11.5–15.5)
WBC: 8.7 10*3/uL (ref 4.0–10.5)
nRBC: 0 % (ref 0.0–0.2)

## 2020-01-18 LAB — HEPATIC FUNCTION PANEL
ALT: 18 U/L (ref 0–44)
AST: 25 U/L (ref 15–41)
Albumin: 4.1 g/dL (ref 3.5–5.0)
Alkaline Phosphatase: 57 U/L (ref 38–126)
Bilirubin, Direct: 0.3 mg/dL — ABNORMAL HIGH (ref 0.0–0.2)
Indirect Bilirubin: 1.3 mg/dL — ABNORMAL HIGH (ref 0.3–0.9)
Total Bilirubin: 1.6 mg/dL — ABNORMAL HIGH (ref 0.3–1.2)
Total Protein: 6.8 g/dL (ref 6.5–8.1)

## 2020-01-18 LAB — PROTIME-INR
INR: 1.1 (ref 0.8–1.2)
Prothrombin Time: 13.4 seconds (ref 11.4–15.2)

## 2020-01-18 LAB — WET PREP, GENITAL
Sperm: NONE SEEN
Trich, Wet Prep: NONE SEEN
WBC, Wet Prep HPF POC: NONE SEEN
Yeast Wet Prep HPF POC: NONE SEEN

## 2020-01-18 LAB — SARS CORONAVIRUS 2 BY RT PCR (HOSPITAL ORDER, PERFORMED IN ~~LOC~~ HOSPITAL LAB): SARS Coronavirus 2: NEGATIVE

## 2020-01-18 LAB — CK: Total CK: 205 U/L (ref 38–234)

## 2020-01-18 MED ORDER — ADULT MULTIVITAMIN W/MINERALS CH
1.0000 | ORAL_TABLET | Freq: Every day | ORAL | Status: DC
  Administered 2020-01-19 – 2020-01-27 (×9): 1 via ORAL
  Filled 2020-01-18 (×9): qty 1

## 2020-01-18 MED ORDER — SERTRALINE HCL 25 MG PO TABS
25.0000 mg | ORAL_TABLET | Freq: Every day | ORAL | Status: DC
  Administered 2020-01-18 – 2020-01-27 (×10): 25 mg via ORAL
  Filled 2020-01-18 (×10): qty 1

## 2020-01-18 MED ORDER — IOHEXOL 300 MG/ML  SOLN
100.0000 mL | Freq: Once | INTRAMUSCULAR | Status: AC | PRN
  Administered 2020-01-18: 100 mL via INTRAVENOUS

## 2020-01-18 MED ORDER — ACETAMINOPHEN 325 MG PO TABS: 650.0000 mg | ORAL_TABLET | ORAL | Status: DC | PRN

## 2020-01-18 MED ORDER — ACETAMINOPHEN 325 MG PO TABS
650.0000 mg | ORAL_TABLET | Freq: Once | ORAL | Status: AC
  Administered 2020-01-18: 650 mg via ORAL
  Filled 2020-01-18: qty 2

## 2020-01-18 MED ORDER — TRAMADOL HCL 50 MG PO TABS
50.0000 mg | ORAL_TABLET | Freq: Four times a day (QID) | ORAL | Status: DC | PRN
  Administered 2020-01-20 – 2020-01-27 (×5): 50 mg via ORAL
  Filled 2020-01-18 (×6): qty 1

## 2020-01-18 MED ORDER — ONDANSETRON 4 MG PO TBDP
4.0000 mg | ORAL_TABLET | Freq: Four times a day (QID) | ORAL | Status: DC | PRN
  Administered 2020-01-20: 4 mg via ORAL
  Filled 2020-01-18: qty 1

## 2020-01-18 MED ORDER — HYDRALAZINE HCL 25 MG PO TABS
25.0000 mg | ORAL_TABLET | Freq: Three times a day (TID) | ORAL | Status: DC
  Administered 2020-01-18 – 2020-01-27 (×25): 25 mg via ORAL
  Filled 2020-01-18 (×25): qty 1

## 2020-01-18 MED ORDER — CALCIUM CARBONATE-VITAMIN D 500-200 MG-UNIT PO TABS
1.0000 | ORAL_TABLET | Freq: Two times a day (BID) | ORAL | Status: DC
  Administered 2020-01-18 – 2020-01-27 (×18): 1 via ORAL
  Filled 2020-01-18 (×18): qty 1

## 2020-01-18 MED ORDER — HYDROCODONE-ACETAMINOPHEN 5-325 MG PO TABS: 1.0000 | ORAL_TABLET | ORAL | Status: DC | PRN

## 2020-01-18 MED ORDER — ENOXAPARIN SODIUM 30 MG/0.3ML ~~LOC~~ SOLN
30.0000 mg | SUBCUTANEOUS | Status: DC
  Administered 2020-01-20 – 2020-01-26 (×7): 30 mg via SUBCUTANEOUS
  Filled 2020-01-18 (×9): qty 0.3

## 2020-01-18 MED ORDER — LOSARTAN POTASSIUM 50 MG PO TABS
25.0000 mg | ORAL_TABLET | Freq: Every day | ORAL | Status: DC
  Administered 2020-01-18 – 2020-01-27 (×10): 25 mg via ORAL
  Filled 2020-01-18 (×10): qty 1

## 2020-01-18 MED ORDER — POTASSIUM CHLORIDE IN NACL 20-0.9 MEQ/L-% IV SOLN
INTRAVENOUS | Status: DC
  Filled 2020-01-18 (×2): qty 1000

## 2020-01-18 MED ORDER — ONDANSETRON HCL 4 MG/2ML IJ SOLN: 4.0000 mg | Freq: Four times a day (QID) | INTRAMUSCULAR | Status: DC | PRN

## 2020-01-18 MED ORDER — DONEPEZIL HCL 10 MG PO TABS
10.0000 mg | ORAL_TABLET | Freq: Every day | ORAL | Status: DC
  Administered 2020-01-18 – 2020-01-26 (×9): 10 mg via ORAL
  Filled 2020-01-18 (×9): qty 1

## 2020-01-18 MED ORDER — HYDROMORPHONE HCL 1 MG/ML IJ SOLN: 0.5000 mg | INTRAMUSCULAR | Status: DC | PRN

## 2020-01-18 MED ORDER — SODIUM CHLORIDE (PF) 0.9 % IJ SOLN
INTRAMUSCULAR | Status: AC
  Filled 2020-01-18: qty 50

## 2020-01-18 MED ORDER — BUDESONIDE 3 MG PO CPEP
9.0000 mg | ORAL_CAPSULE | Freq: Every day | ORAL | Status: DC
  Administered 2020-01-19 – 2020-01-27 (×9): 9 mg via ORAL
  Filled 2020-01-18 (×10): qty 3

## 2020-01-18 NOTE — ED Provider Notes (Signed)
[facility] DEPT Provider Note   CSN: [id number] Arrival date & time: 01/18/20  1317     History Chief Complaint  Patient presents with  . Fall    [name] is a 86 y.o. female with a past medical history of dementia, Crohn's disease, pelvic fractures, osteoporosis.  History is obtained by EMS.  Apparently the patient fell last night returned to the bathroom.  She is caretakers during the day who noticed that she was on the floor this morning when they arrived.  She was unable to get herself back up.  She does not remember hitting her head or losing consciousness.  She does not appear to be on any blood thinners.  HPI     Past Medical History:  Diagnosis Date  . Acute diastolic (congestive) heart failure ([place])   . Bladder prolapse, female, acquired    pessary in  . Chronic diastolic (congestive) heart failure ([place])   . Chronic pain syndrome   . Complete uterine prolapse   . Crohn's disease ([place]) 2010  . Diverticulosis   . E. coli UTI 06/09/2013  . Fall 05/2013  . Hemorrhoids   . History of hypercalcemia   . History of hyperglycemia   . Hydronephrosis   . Hyperparathyroidism   . Hypertension   . Multiple pelvic fractures ([place]) 07/02/2013  . Osteoporosis   . Pubic ramus fracture ([place])   . Urinary incontinence 09/09/2011  . Vitamin D deficiency     Patient Active Problem List   Diagnosis Date Noted  . Risk for falls 01/20/2018  . Weakness 12/04/2017  . Elevated blood pressure reading 12/04/2017  . Chronic diastolic CHF (congestive heart failure) ([place]) 12/04/2017  . Malnutrition of moderate degree 12/04/2017  . Chest wall pain 04/03/2017  . Shingles 03/12/2017  . Healthcare maintenance 12/18/2016  . Advance care planning 12/18/2016  . OAB (overactive bladder) 09/20/2015  . Procidentia of uterus 02/15/2015  . Vaginal vault prolapse 11/16/2014  . Multiple pelvic fractures ([place]) 07/02/2013  . Urinary incontinence 09/09/2011  .  Osteoporosis 10/17/2009  . Primary hyperparathyroidism ([place]) 10/02/2009  . Vitamin D deficiency 01/06/2009  . CROHN'S DISEASE, SMALL INTESTINE 01/04/2009  . Bladder prolapse, female, acquired 01/04/2009    Past Surgical History:  Procedure Laterality Date  . COLONOSCOPY  12/23/2008   crohn's, diverticulosis, internal hemorrhoids  . HIP FRACTURE SURGERY Left    left, prosthesis  . LAPAROSCOPY  10/2009  . PARATHYROIDECTOMY Left 08/30/2013   Procedure: LEFT INFERIOR PARATHYROIDECTOMY;  Surgeon: Earnstine Regal, MD;  Location: [facility] ORS;  Service: General;  Laterality: Left;  . RHINOPLASTY    . TONSILLECTOMY       OB History    Gravida  3   Para  3   Term  3   Preterm      AB      Living  3     SAB      TAB      Ectopic      Multiple      Live Births              Family History  Problem Relation Age of Onset  . Heart disease Father   . Colon cancer Father   . Osteoporosis Mother   . Breast cancer Mother   . Prostate cancer Brother     Social History   Tobacco Use  . Smoking status: Never Smoker  . Smokeless tobacco: Never Used  Vaping Use  .  Vaping Use: Never used  Substance Use Topics  . Alcohol use: Yes    Alcohol/week: 0.0 standard drinks    Comment: occ wine  . Drug use: No    Home Medications Prior to Admission medications   Medication Sig Start Date End Date Taking? Authorizing Provider  budesonide (ENTOCORT EC) 3 MG 24 hr capsule Take 3 capsules (9 mg total) by mouth daily. 11/16/18   Gatha Mayer, MD  calcium-vitamin D (OSCAL WITH D) 500-200 MG-UNIT tablet Take 1 tablet by mouth 2 (two) times daily.    [provider]  donepezil (ARICEPT) 10 MG tablet Take 1 tablet daily 11/16/18   Cameron Sprang, MD  hydrALAZINE (APRESOLINE) 25 MG tablet Take 1 tablet (25 mg total) by mouth every 8 (eight) hours. 05/11/18   Gatha Mayer, MD  losartan (COZAAR) 25 MG tablet Take 1 tablet by mouth once daily 12/15/18   Tonia Ghent, MD  Multiple  Vitamin (MULTIVITAMIN) tablet Take 1 tablet by mouth daily.    [provider]  sertraline (ZOLOFT) 25 MG tablet Take 1 tablet by mouth once daily 08/13/18   Gatha Mayer, MD    Allergies    Other  Review of Systems   Review of Systems  Ten systems reviewed and are negative for acute change, except as noted in the HPI.    Physical Exam Updated Vital Signs BP (!) 159/82 (BP Location: Left Arm)   Pulse 63   Temp 98.6 F (37 C) (Oral)   Resp 15   SpO2 95%   Physical Exam Vitals and nursing note reviewed.  Constitutional:      General: She is not in acute distress.    Appearance: She is well-developed. She is not diaphoretic.  HENT:     Head: Normocephalic and atraumatic.  Eyes:     General: No scleral icterus.    Conjunctiva/sclera: Conjunctivae normal.  Cardiovascular:     Rate and Rhythm: Normal rate and regular rhythm.     Heart sounds: Normal heart sounds. No murmur heard.  No friction rub. No gallop.   Pulmonary:     Effort: Pulmonary effort is normal. No respiratory distress.     Breath sounds: Normal breath sounds.  Abdominal:     General: Bowel sounds are normal. There is no distension.     Palpations: Abdomen is soft. There is no mass.     Tenderness: There is no abdominal tenderness. There is no guarding.  Musculoskeletal:     Cervical back: Normal range of motion.     Comments: Patient with large hematoma on the lateral aspect of the right upper arm.  Tender to palpation.  She is guarding movement of the right shoulder.  No bony tenderness in the right elbow, wrist and hands. nvi  Skin:    General: Skin is warm and dry.  Neurological:     Mental Status: She is alert and oriented to person, place, and time.  Psychiatric:        Behavior: Behavior normal.     ED Results / Procedures / Treatments   Labs (all labs ordered are listed, but only abnormal results are displayed) Labs Reviewed - No data to display  EKG None  Radiology No  results found.  Procedures Procedures (including critical care time)  Medications Ordered in ED Medications - No data to display  ED Course  I have reviewed the triage vital signs and the nursing notes.  Pertinent labs & imaging results  that were available during my care of the patient were reviewed by me and considered in my medical decision making (see chart for details).    MDM Rules/Calculators/A&P                          86 year old female with mechanical fall.  I ordered interpreted reviewed the patient's labs which shows slightly elevated blood glucose, mildly elevated BUN not of significant value.  CBC she is elevated hemoglobin likely due to volume contraction.  CK within normal limits no evidence of rhabdomyolysis.  Imaging is currently pending.  Have given signout to Johnson who will assume care of the patient. Final Clinical Impression(s) / ED Diagnoses Final diagnoses:  None    Rx / DC Orders ED Discharge Orders    None       Margarita Mail, PA-C 01/18/20 1514    Veryl Speak, MD 02/08/20 1514

## 2020-01-18 NOTE — ED Notes (Signed)
Pt BIB EMS. Pt had a mechanical fall last night when trying to walk to bed. Pt was found on floor this morning by her aide at 1100. Pt is complaining of right arm pain; bruising noted. No LOC, no blood thinners.   BP-166/80 HR-64 O2-98% CBG-107

## 2020-01-18 NOTE — ED Provider Notes (Addendum)
Patient received at shift change from [facility].  Please see her note for full HPI.  In short, 86 year old with a fall last night on her way back from the bathroom, caretakers found her on the floor this morning.  Unable to get up, denied any head injury or LOC.  Not on any blood thinners.  Denied any chest pain or shortness of breath.  No headaches.  No dizziness.  Care received pending imaging.  LABS:  I personally reviewed and interpreted her labs CBC without leukocytosis, slightly increased hemoglobin of 15.2. BMP without any electrolyte abnormalities, normal renal function other than a slightly increased BUN.  Normal anion gap.  Hepatic function panel with slight increase of total bili at 1.6, previous values from a year ago were normal.   CK normal at 205. Normal PT/INR. Covid pending.  IMAGING:  I personally reviewed and interpreted her imaging Plain films of the right humerus and shoulder without evidence of fracture to the humerus, however she does have a right scapular fracture. CT head and C-spine without evidence of acute injury. CT chest abdomen pelvis with trace pneumo on the right, however very faint.  Patient is unsure short of breath.  She also has multiple displaced and nondisplaced rib fractures from ribs 2 through 5. Sub q gas noted on the right flank   MDM:  Patient with multiple displaced rib fractures and right scapular fracture. Patient was also seen and evaluated by Dr. Ralene Bathe. Consulted Dr. Ninfa Linden with general surgery, who would like the patient to be transferred over to [facility]. I also spoke with Dr. Stann Mainland with orthopedics per general surgery request and had made him aware of her scapula fractures   DISPOSITION" Transfer to [facility], to be evaluated by general surgery and ortho   Pt has remained hemodynamically stable, pain treated with Tylenol.    Physical Exam  BP (!) 170/77 (BP Location: Right Arm)   Pulse 85   Temp 98.7 F (37.1  C) (Oral)   Resp (!) 30   Ht 5' 2"  (1.575 m)   Wt 47.6 kg   SpO2 96%   BMI 19.20 kg/m   Physical Exam Vitals and nursing note reviewed.  Constitutional:      General: She is not in acute distress.    Appearance: Normal appearance. She is well-developed.  HENT:     Head: Normocephalic and atraumatic.  Eyes:     Conjunctiva/sclera: Conjunctivae normal.  Cardiovascular:     Rate and Rhythm: Normal rate and regular rhythm.     Heart sounds: No murmur heard.   Pulmonary:     Effort: Pulmonary effort is normal. No respiratory distress.     Breath sounds: Normal breath sounds.  Abdominal:     Palpations: Abdomen is soft.     Tenderness: There is no abdominal tenderness.  Musculoskeletal:     Cervical back: Neck supple.  Skin:    General: Skin is warm and dry.  Neurological:     Mental Status: She is alert.     ED Course/Procedures   Procedures CRITICAL CARE Performed by: Polo Riley   Total critical care time: 35 minutes  Critical care time was exclusive of separately billable procedures and treating other patients.  Critical care was necessary to treat or prevent imminent or life-threatening deterioration.  Critical care was time spent personally by me on the following activities: development of treatment plan with patient and/or surrogate as well as nursing, discussions with consultants, evaluation  of patient's response to treatment, examination of patient, obtaining history from patient or surrogate, ordering and performing treatments and interventions, ordering and review of laboratory studies, ordering and review of radiographic studies, pulse oximetry and re-evaluation of patient's condition.  Results for orders placed or performed during the hospital encounter of 29/51/88  Basic metabolic panel  Result Value Ref Range   Sodium 140 135 - 145 mmol/L   Potassium 4.1 3.5 - 5.1 mmol/L   Chloride 107 98 - 111 mmol/L   CO2 23 22 - 32 mmol/L   Glucose, Bld 117  (H) 70 - 99 mg/dL   BUN 24 (H) 8 - 23 mg/dL   Creatinine, Ser 0.76 0.44 - 1.00 mg/dL   Calcium 9.3 8.9 - 10.3 mg/dL   GFR calc non Af Amer >60 >60 mL/min   GFR calc Af Amer >60 >60 mL/min   Anion gap 10 5 - 15  CBC with Differential  Result Value Ref Range   WBC 8.7 4.0 - 10.5 K/uL   RBC 4.75 3.87 - 5.11 MIL/uL   Hemoglobin 15.2 (H) 12.0 - 15.0 g/dL   HCT 44.2 36 - 46 %   MCV 93.1 80.0 - 100.0 fL   MCH 32.0 26.0 - 34.0 pg   MCHC 34.4 30.0 - 36.0 g/dL   RDW 12.4 11.5 - 15.5 %   Platelets 209 150 - 400 K/uL   nRBC 0.0 0.0 - 0.2 %   Neutrophils Relative % 85 %   Neutro Abs 7.4 1.7 - 7.7 K/uL   Lymphocytes Relative 8 %   Lymphs Abs 0.7 0.7 - 4.0 K/uL   Monocytes Relative 7 %   Monocytes Absolute 0.6 0 - 1 K/uL   Eosinophils Relative 0 %   Eosinophils Absolute 0.0 0 - 0 K/uL   Basophils Relative 0 %   Basophils Absolute 0.0 0 - 0 K/uL   Immature Granulocytes 0 %   Abs Immature Granulocytes 0.03 0.00 - 0.07 K/uL  CK  Result Value Ref Range   Total CK 205 38.0 - 234.0 U/L  Hepatic function panel  Result Value Ref Range   Total Protein 6.8 6.5 - 8.1 g/dL   Albumin 4.1 3.5 - 5.0 g/dL   AST 25 15 - 41 U/L   ALT 18 0 - 44 U/L   Alkaline Phosphatase 57 38 - 126 U/L   Total Bilirubin 1.6 (H) 0.3 - 1.2 mg/dL   Bilirubin, Direct 0.3 (H) 0.0 - 0.2 mg/dL   Indirect Bilirubin 1.3 (H) 0.3 - 0.9 mg/dL  Protime-INR  Result Value Ref Range   Prothrombin Time 13.4 11.4 - 15.2 seconds   INR 1.1 0.8 - 1.2   DG Shoulder Right  Result Date: 01/18/2020 CLINICAL DATA:  Fall, right shoulder pain EXAM: RIGHT SHOULDER - 2+ VIEW COMPARISON:  None. FINDINGS: Two view radiograph of the right shoulder demonstrates a a transverse fracture of the body of the scapula. Additionally, there are fractures of the right second, third, fourth, and fifth ribs laterally, with fractures of the third and fourth ribs in multiple locations. There is associated right pleural thickening. No definite pneumothorax.  Visualized proximal right humerus is intact and seated within the glenoid fossa. Clavicle is intact. IMPRESSION: 1. Transverse fracture of the body of the scapula. 2. Multiple right-sided rib fractures with associated pleural thickening. No definite pneumothorax. Electronically Signed   By: Fidela Salisbury MD   On: 01/18/2020 16:34   CT Head Wo Contrast  Result Date: 01/18/2020 CLINICAL  DATA:  Head injury, mechanical fall EXAM: CT HEAD WITHOUT CONTRAST TECHNIQUE: Contiguous axial images were obtained from the base of the skull through the vertex without intravenous contrast. COMPARISON:  03/22/2018 FINDINGS: Brain: There is normal anatomic configuration of the brain. Moderate parenchymal volume loss is again noted and appears stable since prior examination. Extensive periventricular and subcortical white matter changes are present likely reflecting the sequela of small vessel ischemia. There is mild ventriculomegaly, stable since prior examination. No evidence of acute intracranial hemorrhage or infarct. No abnormal mass effect or midline shift. No abnormal intra or extra-axial mass lesion or fluid collection. The cerebellum is unremarkable Vascular: Extensive atherosclerotic calcification is seen within the carotid siphons. No asymmetric hyperdense vasculature at the skull base. Skull: Intact Sinuses/Orbits: Orbits are unremarkable. Visualized paranasal sinuses are clear. Other: There is fluid opacification of several inferior ethmoid air cells bilaterally without associated osseous erosion. Middle ear cavities are clear. IMPRESSION: 1. No acute intracranial abnormality. 2. Stable mild ventriculomegaly. Electronically Signed   By: Fidela Salisbury MD   On: 01/18/2020 16:38   CT Cervical Spine Wo Contrast  Addendum Date: 01/18/2020   ADDENDUM REPORT: 01/18/2020 17:00 ADDENDUM: These results were called by telephone at the time of interpretation on 01/18/2020 at 4:59 pm to provider ABIGAIL HARRIS , who verbally  acknowledged these results. Electronically Signed   By: Fidela Salisbury MD   On: 01/18/2020 17:00   Result Date: 01/18/2020 CLINICAL DATA:  Mechanical fall, neck trauma EXAM: CT CERVICAL SPINE WITHOUT CONTRAST TECHNIQUE: Multidetector CT imaging of the cervical spine was performed without intravenous contrast. Multiplanar CT image reconstructions were also generated. COMPARISON:  None. FINDINGS: Alignment: There is normal cervical lordosis. No listhesis of the cervical spine. Skull base and vertebrae: The craniocervical junction is unremarkable. The atlantodental interval is normal. Vertebral body height has been preserved. No acute fracture of the cervical spine. No lytic or blastic bone lesions are identified. Soft tissues and spinal canal: No prevertebral fluid or swelling. No visible canal hematoma. Disc levels: Sagittal reformats demonstrates moderate degenerate arthritis at the atlantodental articulation. There is moderate intervertebral disc space narrowing and endplate remodeling at D3-2 and C6-7 in keeping with changes of moderate degenerative disc disease. Intra discal calcifications with a small calcified posterior disc herniation is noted at C2-3. Review of the axial images demonstrates: C2-3: Small posterior calcified central disc herniation. No significant canal stenosis. Minimal bilateral facet arthrosis. No significant neural foraminal narrowing. C3-4: Moderate left and mild right facet arthrosis. Mild bilateral neural foraminal narrowing. No significant canal stenosis. C4-5: Moderate bilateral facet arthrosis. Mild left and moderate right neural foraminal narrowing. No significant canal stenosis. C5-6: Mild uncovertebral arthrosis and facet arthrosis results in mild bilateral neural foraminal narrowing. No significant canal stenosis. C6-7: Mild uncovertebral and facet arthrosis results in mild left neural foraminal narrowing. No significant canal stenosis. C7-T1: Moderate right facet arthrosis with  hypertrophic osteophyte formation abuts the thecal sac laterally and slightly deforms the thecal sac. No significant canal stenosis. No significant neural foraminal narrowing. Upper chest: Tiny right apical pneumothorax may be present, incompletely evaluated on this examination Other: None significant IMPRESSION: 1. No acute fracture or subluxation of the cervical spine. 2. Moderate degenerative disc disease and facet arthrosis as above. 3. Tiny right apical pneumothorax may be present, incompletely evaluated on this examination. Dedicated CT imaging of the chest is recommended for further evaluation. Electronically Signed: By: Fidela Salisbury MD On: 01/18/2020 16:55   CT CHEST ABDOMEN PELVIS W CONTRAST  Result Date: 01/18/2020 CLINICAL DATA:  Pain status post fall right arm pain. Right scapular fracture and right rib fractures noted. EXAM: CT CHEST, ABDOMEN, AND PELVIS WITH CONTRAST TECHNIQUE: Multidetector CT imaging of the chest, abdomen and pelvis was performed following the standard protocol during bolus administration of intravenous contrast. CONTRAST:  151m OMNIPAQUE IOHEXOL 300 MG/ML  SOLN COMPARISON:  CT dated December 04, 2017 FINDINGS: CT CHEST FINDINGS Cardiovascular: The heart size is normal. Mild atherosclerotic changes are noted of the thoracic aorta. There is no evidence for a dissection or aneurysm. There is no large centrally located pulmonary embolism. Mediastinum/Nodes: -- No mediastinal lymphadenopathy. -- No hilar lymphadenopathy. -- No axillary lymphadenopathy. -- No supraclavicular lymphadenopathy. --there multiple thyroid nodules measuring up to approximately 1.2 cm. No followup recommended (ref: J Am Coll Radiol. 2015 Feb;12(2): 143-50). -there is a small hiatal hernia. Lungs/Pleura: There is a trace right-sided pneumothorax. There is airspace disease at the lung bases bilaterally favored to represent atelectasis. There are small bilateral pleural effusions. The trachea is unremarkable.  Musculoskeletal: Again noted is an acute, minimally displaced fracture through the scapula body. There are multiple displaced and nondisplaced right-sided rib fractures involving ribs 2 through 5. There are additional chronic appearing right-sided rib fractures. There is subcutaneous gas along the patient's right flank. There are old healed left-sided rib fractures. CT ABDOMEN PELVIS FINDINGS Hepatobiliary: The liver is normal. Normal gallbladder.There is no biliary ductal dilation. Pancreas: Normal contours without ductal dilatation. No peripancreatic fluid collection. Spleen: Unremarkable. Adrenals/Urinary Tract: --Adrenal glands: Unremarkable. --Right kidney/ureter: No hydronephrosis or radiopaque kidney stones. --Left kidney/ureter: No hydronephrosis or radiopaque kidney stones. --Urinary bladder: Unremarkable. Stomach/Bowel: --Stomach/Duodenum: There is a small hiatal hernia. --Small bowel: There are mild scattered areas of small-bowel wall thickening which are felt to be related to the patient's history of Crohn's disease. --Colon: Rectosigmoid diverticulosis without acute inflammation. --Appendix: Normal. Vascular/Lymphatic: Atherosclerotic calcification is present within the non-aneurysmal abdominal aorta, without hemodynamically significant stenosis. --No retroperitoneal lymphadenopathy. --No mesenteric lymphadenopathy. --No pelvic or inguinal lymphadenopathy. Reproductive: A pelvic floor support devices noted. There is an a left ovarian cystic mass measuring approximately 2.1 cm (axial series 2, image 91). No further follow-up is recommended for this finding. Other: No ascites or free air. The abdominal wall is normal. Musculoskeletal. The patient is status post prior total hip arthroplasty on the left. There are old healed pelvic fractures on the right. There is no definite acute displaced fracture on today's exam. IMPRESSION: 1. Trace right-sided pneumothorax. 2. Multiple displaced and nondisplaced  right-sided rib fractures involving ribs 2 through 5. There is subcutaneous gas along the patient's right flank. 3. Acute, mildly displaced right scapular fracture. 4. Small bilateral pleural effusions with associated atelectasis. 5. No acute intra-abdominal or pelvic injury. 6. Rectosigmoid diverticulosis without acute inflammation. Aortic Atherosclerosis (ICD10-I70.0). Electronically Signed   By: CConstance HolsterM.D.   On: 01/18/2020 18:22   DG Humerus Right  Result Date: 01/18/2020 CLINICAL DATA:  Mechanical fall last night walking to bed. Right arm pain and bruising. EXAM: RIGHT HUMERUS - 2+ VIEW COMPARISON:  Shoulder radiograph performed concurrently. FINDINGS: Cortical margins of the humerus are intact. There is no evidence of fracture or other focal bone lesions. Elbow alignment is maintained. Displaced scapular fracture, better assessed on concurrent shoulder exam. There are right rib fractures. Soft tissues are unremarkable. IMPRESSION: 1. No fracture of the right humerus. 2. Right scapular fracture, better assessed on concurrent shoulder exam. Electronically Signed   By: MAurther LoftD.  On: 01/18/2020 16:32    MDM         Garald Balding, PA-C 01/18/20 2123    Quintella Reichert, MD 01/19/20 2348

## 2020-01-18 NOTE — H&P (Signed)
History   [name] is an 86 y.o. female.   Chief Complaint:  Chief Complaint  Patient presents with  . Fall    Fall  This is an 86 year old female who lives alone and fell last evening. She was found by caregiver this morning and brought by EMS to the hospital. She reports minimal chest pain. She denies neck pain or abdominal pain. She has some short-term memory loss which is chronic but denies loss of consciousness. She denies headache. She denies shortness of breath as well.  Past Medical History:  Diagnosis Date  . Acute diastolic (congestive) heart failure ([place])   . Bladder prolapse, female, acquired    pessary in  . Chronic diastolic (congestive) heart failure ([place])   . Chronic pain syndrome   . Complete uterine prolapse   . Crohn's disease ([place]) 2010  . Diverticulosis   . E. coli UTI 06/09/2013  . Fall 05/2013  . Hemorrhoids   . History of hypercalcemia   . History of hyperglycemia   . Hydronephrosis   . Hyperparathyroidism   . Hypertension   . Multiple pelvic fractures ([place]) 07/02/2013  . Osteoporosis   . Pubic ramus fracture ([place])   . Urinary incontinence 09/09/2011  . Vitamin D deficiency     Past Surgical History:  Procedure Laterality Date  . COLONOSCOPY  12/23/2008   crohn's, diverticulosis, internal hemorrhoids  . HIP FRACTURE SURGERY Left    left, prosthesis  . LAPAROSCOPY  10/2009  . PARATHYROIDECTOMY Left 08/30/2013   Procedure: LEFT INFERIOR PARATHYROIDECTOMY;  Surgeon: Earnstine Regal, MD;  Location: [facility];  Service: General;  Laterality: Left;  . RHINOPLASTY    . TONSILLECTOMY      Family History  Problem Relation Age of Onset  . Heart disease Father   . Colon cancer Father   . Osteoporosis Mother   . Breast cancer Mother   . Prostate cancer Brother    Social History:  reports that she has never smoked. She has never used smokeless tobacco. She reports current alcohol use. She reports that she does not use drugs.  Allergies   Allergies   Allergen Reactions  . Other     Home Medications  (Not in a hospital admission)   Trauma Course   Results for orders placed or performed during the hospital encounter of 01/18/20 (from the past 48 hour(s))  Basic metabolic panel     Status: Abnormal   Collection Time: 01/18/20  2:18 PM  Result Value Ref Range   Sodium 140 135 - 145 mmol/L   Potassium 4.1 3.5 - 5.1 mmol/L   Chloride 107 98 - 111 mmol/L   CO2 23 22 - 32 mmol/L   Glucose, Bld 117 (H) 70 - 99 mg/dL    Comment: Glucose reference range applies only to samples taken after fasting for at least 8 hours.   BUN 24 (H) 8 - 23 mg/dL   Creatinine, Ser 0.76 0.44 - 1.00 mg/dL   Calcium 9.3 8.9 - 10.3 mg/dL   GFR calc non Af Amer >60 >60 mL/min   GFR calc Af Amer >60 >60 mL/min   Anion gap 10 5 - 15    Comment: Performed at [facility], [place] [address], [place], [place] [address]  CBC with Differential     Status: Abnormal   Collection Time: 01/18/20  2:18 PM  Result Value Ref Range   WBC 8.7 4.0 - 10.5 K/uL   RBC 4.75 3.87 - 5.11  MIL/uL   Hemoglobin 15.2 (H) 12.0 - 15.0 g/dL   HCT 44.2 36 - 46 %   MCV 93.1 80.0 - 100.0 fL   MCH 32.0 26.0 - 34.0 pg   MCHC 34.4 30.0 - 36.0 g/dL   RDW 12.4 11.5 - 15.5 %   Platelets 209 150 - 400 K/uL   nRBC 0.0 0.0 - 0.2 %   Neutrophils Relative % 85 %   Neutro Abs 7.4 1.7 - 7.7 K/uL   Lymphocytes Relative 8 %   Lymphs Abs 0.7 0.7 - 4.0 K/uL   Monocytes Relative 7 %   Monocytes Absolute 0.6 0 - 1 K/uL   Eosinophils Relative 0 %   Eosinophils Absolute 0.0 0 - 0 K/uL   Basophils Relative 0 %   Basophils Absolute 0.0 0 - 0 K/uL   Immature Granulocytes 0 %   Abs Immature Granulocytes 0.03 0.00 - 0.07 K/uL    Comment: Performed at [facility], [place] [address] [place], [place] [address]  CK     Status: None   Collection Time: 01/18/20  2:18 PM  Result Value Ref Range   Total CK 205 38.0 - 234.0 U/L    Comment: Performed at [facility], [place] [address] [place], [place] [address]  Hepatic function panel     Status: Abnormal   Collection Time: 01/18/20  7:03 PM  Result Value Ref Range   Total Protein 6.8 6.5 - 8.1 g/dL   Albumin 4.1 3.5 - 5.0 g/dL   AST 25 15 - 41 U/L   ALT 18 0 - 44 U/L   Alkaline Phosphatase 57 38 - 126 U/L   Total Bilirubin 1.6 (H) 0.3 - 1.2 mg/dL   Bilirubin, Direct 0.3 (H) 0.0 - 0.2 mg/dL   Indirect Bilirubin 1.3 (H) 0.3 - 0.9 mg/dL    Comment: Performed at [facility], [place] [address], [place], [place] [address]  Protime-INR     Status: None   Collection Time: 01/18/20  7:03 PM  Result Value Ref Range   Prothrombin Time 13.4 11.4 - 15.2 seconds   INR 1.1 0.8 - 1.2    Comment: (NOTE) INR goal varies based on device and disease states. Performed at [facility], [place] [address] [place], [place] [address]    DG Shoulder Right  Result Date: 01/18/2020 CLINICAL DATA:  Fall, right shoulder pain EXAM: RIGHT SHOULDER - 2+ VIEW COMPARISON:  None. FINDINGS: Two view radiograph of the right shoulder demonstrates a a transverse fracture of the body of the scapula. Additionally, there are fractures of the right second, third, fourth, and fifth ribs laterally, with fractures of the third and fourth ribs in multiple locations. There is associated right pleural thickening. No definite pneumothorax. Visualized proximal right humerus is intact and seated within the glenoid fossa. Clavicle is intact. IMPRESSION: 1. Transverse fracture of the body of the scapula. 2. Multiple right-sided rib fractures with associated pleural thickening. No definite pneumothorax. Electronically Signed   By: Fidela Salisbury MD   On: 01/18/2020 16:34   CT Head Wo Contrast  Result Date: 01/18/2020 CLINICAL DATA:  Head injury, mechanical fall EXAM: CT HEAD WITHOUT CONTRAST TECHNIQUE: Contiguous axial images were obtained from the base of the skull through the vertex without intravenous  contrast. COMPARISON:  03/22/2018 FINDINGS: Brain: There is normal anatomic configuration of the brain. Moderate parenchymal volume loss is again noted and appears stable since prior examination. Extensive periventricular and subcortical  white matter changes are present likely reflecting the sequela of small vessel ischemia. There is mild ventriculomegaly, stable since prior examination. No evidence of acute intracranial hemorrhage or infarct. No abnormal mass effect or midline shift. No abnormal intra or extra-axial mass lesion or fluid collection. The cerebellum is unremarkable Vascular: Extensive atherosclerotic calcification is seen within the carotid siphons. No asymmetric hyperdense vasculature at the skull base. Skull: Intact Sinuses/Orbits: Orbits are unremarkable. Visualized paranasal sinuses are clear. Other: There is fluid opacification of several inferior ethmoid air cells bilaterally without associated osseous erosion. Middle ear cavities are clear. IMPRESSION: 1. No acute intracranial abnormality. 2. Stable mild ventriculomegaly. Electronically Signed   By: Fidela Salisbury MD   On: 01/18/2020 16:38   CT Cervical Spine Wo Contrast  Addendum Date: 01/18/2020   ADDENDUM REPORT: 01/18/2020 17:00 ADDENDUM: These results were called by telephone at the time of interpretation on 01/18/2020 at 4:59 pm to provider ABIGAIL HARRIS , who verbally acknowledged these results. Electronically Signed   By: Fidela Salisbury MD   On: 01/18/2020 17:00   Result Date: 01/18/2020 CLINICAL DATA:  Mechanical fall, neck trauma EXAM: CT CERVICAL SPINE WITHOUT CONTRAST TECHNIQUE: Multidetector CT imaging of the cervical spine was performed without intravenous contrast. Multiplanar CT image reconstructions were also generated. COMPARISON:  None. FINDINGS: Alignment: There is normal cervical lordosis. No listhesis of the cervical spine. Skull base and vertebrae: The craniocervical junction is unremarkable. The atlantodental  interval is normal. Vertebral body height has been preserved. No acute fracture of the cervical spine. No lytic or blastic bone lesions are identified. Soft tissues and spinal canal: No prevertebral fluid or swelling. No visible canal hematoma. Disc levels: Sagittal reformats demonstrates moderate degenerate arthritis at the atlantodental articulation. There is moderate intervertebral disc space narrowing and endplate remodeling at H6-0 and C6-7 in keeping with changes of moderate degenerative disc disease. Intra discal calcifications with a small calcified posterior disc herniation is noted at C2-3. Review of the axial images demonstrates: C2-3: Small posterior calcified central disc herniation. No significant canal stenosis. Minimal bilateral facet arthrosis. No significant neural foraminal narrowing. C3-4: Moderate left and mild right facet arthrosis. Mild bilateral neural foraminal narrowing. No significant canal stenosis. C4-5: Moderate bilateral facet arthrosis. Mild left and moderate right neural foraminal narrowing. No significant canal stenosis. C5-6: Mild uncovertebral arthrosis and facet arthrosis results in mild bilateral neural foraminal narrowing. No significant canal stenosis. C6-7: Mild uncovertebral and facet arthrosis results in mild left neural foraminal narrowing. No significant canal stenosis. C7-T1: Moderate right facet arthrosis with hypertrophic osteophyte formation abuts the thecal sac laterally and slightly deforms the thecal sac. No significant canal stenosis. No significant neural foraminal narrowing. Upper chest: Tiny right apical pneumothorax may be present, incompletely evaluated on this examination Other: None significant IMPRESSION: 1. No acute fracture or subluxation of the cervical spine. 2. Moderate degenerative disc disease and facet arthrosis as above. 3. Tiny right apical pneumothorax may be present, incompletely evaluated on this examination. Dedicated CT imaging of the chest  is recommended for further evaluation. Electronically Signed: By: Fidela Salisbury MD On: 01/18/2020 16:55   CT CHEST ABDOMEN PELVIS W CONTRAST  Result Date: 01/18/2020 CLINICAL DATA:  Pain status post fall right arm pain. Right scapular fracture and right rib fractures noted. EXAM: CT CHEST, ABDOMEN, AND PELVIS WITH CONTRAST TECHNIQUE: Multidetector CT imaging of the chest, abdomen and pelvis was performed following the standard protocol during bolus administration of intravenous contrast. CONTRAST:  188m OMNIPAQUE IOHEXOL 300 MG/ML  SOLN COMPARISON:  CT dated December 04, 2017 FINDINGS: CT CHEST FINDINGS Cardiovascular: The heart size is normal. Mild atherosclerotic changes are noted of the thoracic aorta. There is no evidence for a dissection or aneurysm. There is no large centrally located pulmonary embolism. Mediastinum/Nodes: -- No mediastinal lymphadenopathy. -- No hilar lymphadenopathy. -- No axillary lymphadenopathy. -- No supraclavicular lymphadenopathy. --there multiple thyroid nodules measuring up to approximately 1.2 cm. No followup recommended (ref: J Am Coll Radiol. 2015 Feb;12(2): 143-50). -there is a small hiatal hernia. Lungs/Pleura: There is a trace right-sided pneumothorax. There is airspace disease at the lung bases bilaterally favored to represent atelectasis. There are small bilateral pleural effusions. The trachea is unremarkable. Musculoskeletal: Again noted is an acute, minimally displaced fracture through the scapula body. There are multiple displaced and nondisplaced right-sided rib fractures involving ribs 2 through 5. There are additional chronic appearing right-sided rib fractures. There is subcutaneous gas along the patient's right flank. There are old healed left-sided rib fractures. CT ABDOMEN PELVIS FINDINGS Hepatobiliary: The liver is normal. Normal gallbladder.There is no biliary ductal dilation. Pancreas: Normal contours without ductal dilatation. No peripancreatic fluid  collection. Spleen: Unremarkable. Adrenals/Urinary Tract: --Adrenal glands: Unremarkable. --Right kidney/ureter: No hydronephrosis or radiopaque kidney stones. --Left kidney/ureter: No hydronephrosis or radiopaque kidney stones. --Urinary bladder: Unremarkable. Stomach/Bowel: --Stomach/Duodenum: There is a small hiatal hernia. --Small bowel: There are mild scattered areas of small-bowel wall thickening which are felt to be related to the patient's history of Crohn's disease. --Colon: Rectosigmoid diverticulosis without acute inflammation. --Appendix: Normal. Vascular/Lymphatic: Atherosclerotic calcification is present within the non-aneurysmal abdominal aorta, without hemodynamically significant stenosis. --No retroperitoneal lymphadenopathy. --No mesenteric lymphadenopathy. --No pelvic or inguinal lymphadenopathy. Reproductive: A pelvic floor support devices noted. There is an a left ovarian cystic mass measuring approximately 2.1 cm (axial series 2, image 91). No further follow-up is recommended for this finding. Other: No ascites or free air. The abdominal wall is normal. Musculoskeletal. The patient is status post prior total hip arthroplasty on the left. There are old healed pelvic fractures on the right. There is no definite acute displaced fracture on today's exam. IMPRESSION: 1. Trace right-sided pneumothorax. 2. Multiple displaced and nondisplaced right-sided rib fractures involving ribs 2 through 5. There is subcutaneous gas along the patient's right flank. 3. Acute, mildly displaced right scapular fracture. 4. Small bilateral pleural effusions with associated atelectasis. 5. No acute intra-abdominal or pelvic injury. 6. Rectosigmoid diverticulosis without acute inflammation. Aortic Atherosclerosis (ICD10-I70.0). Electronically Signed   By: Constance Holster M.D.   On: 01/18/2020 18:22   DG Humerus Right  Result Date: 01/18/2020 CLINICAL DATA:  Mechanical fall last night walking to bed. Right arm pain  and bruising. EXAM: RIGHT HUMERUS - 2+ VIEW COMPARISON:  Shoulder radiograph performed concurrently. FINDINGS: Cortical margins of the humerus are intact. There is no evidence of fracture or other focal bone lesions. Elbow alignment is maintained. Displaced scapular fracture, better assessed on concurrent shoulder exam. There are right rib fractures. Soft tissues are unremarkable. IMPRESSION: 1. No fracture of the right humerus. 2. Right scapular fracture, better assessed on concurrent shoulder exam. Electronically Signed   By: Keith Rake M.D.   On: 01/18/2020 16:32    Review of Systems  All other systems reviewed and are negative.   Blood pressure (!) 163/95, pulse 72, temperature 98.7 F (37.1 C), temperature source Oral, resp. rate (!) 27, height 5' 2"  (1.575 m), weight 47.6 kg, SpO2 97 %. Physical Exam Constitutional:      General: She is  not in acute distress.    Appearance: Normal appearance. She is not diaphoretic.  HENT:     Head: Normocephalic and atraumatic.     Right Ear: External ear normal.     Left Ear: External ear normal.     Nose: Nose normal.     Mouth/Throat:     Mouth: Mucous membranes are moist.  Eyes:     General: No scleral icterus.    Pupils: Pupils are equal, round, and reactive to light.  Cardiovascular:     Rate and Rhythm: Normal rate and regular rhythm.     Pulses: Normal pulses.     Heart sounds: No murmur heard.   Pulmonary:     Effort: Pulmonary effort is normal. No respiratory distress.     Breath sounds: Normal breath sounds.     Comments: There is minimal right-sided chest tenderness Chest:     Chest wall: Tenderness present.  Abdominal:     General: Abdomen is flat.     Palpations: Abdomen is soft.     Tenderness: There is no abdominal tenderness.  Musculoskeletal:        General: Tenderness present. No swelling or deformity. Normal range of motion.     Cervical back: Normal range of motion and neck supple. No tenderness.     Right  lower leg: No edema.     Left lower leg: No edema.     Comments: There is tenderness of the right shoulder  Skin:    General: Skin is warm and dry.     Coloration: Skin is not pale.  Neurological:     General: No focal deficit present.     Mental Status: She is alert and oriented to person, place, and time. Mental status is at baseline.  Psychiatric:        Mood and Affect: Mood normal.        Judgment: Judgment normal.     Assessment/Plan 86 year old female status post fall with injuries including: Multiple right rib fractures some of which are displaced Tiny right pneumothorax Right scapula fracture  I have discussed the situation with her daughter. She needs admission to the trauma hospital for further care. We discussed the multiple injuries. Currently she does not need a chest tube. The PA here at [facility] ER is going to notify orthopedics in case they cannot immediately get a bed over at [facility]. She will need to be admitted to at least telemetry over there or may need an ER to ER transfer. She needs aggressive pulmonary toilet and pain control. PT and OT will be consulted as well. We discussed the risks of developing pneumonia without aggressive pulmonary toilet.  Coralie Keens 01/18/2020, 7:49 PM   Procedures

## 2020-01-18 NOTE — ED Notes (Signed)
[facility] dispatch notified for need of transport.  

## 2020-01-18 NOTE — Progress Notes (Signed)
Case discussed with EDP.  Images reviewed.  Right scapular body fracture to be managed without surgery.  Sling for comfort only.  May dc prn and may WBAT to RUE.  Full consult to come tomorrow.

## 2020-01-19 ENCOUNTER — Inpatient Hospital Stay (HOSPITAL_COMMUNITY): Payer: Medicare HMO

## 2020-01-19 LAB — BASIC METABOLIC PANEL
Anion gap: 10 (ref 5–15)
BUN: 20 mg/dL (ref 8–23)
CO2: 22 mmol/L (ref 22–32)
Calcium: 9.5 mg/dL (ref 8.9–10.3)
Chloride: 103 mmol/L (ref 98–111)
Creatinine, Ser: 0.98 mg/dL (ref 0.44–1.00)
GFR calc Af Amer: >60 mL/min (ref >60)
GFR calc non Af Amer: 53 mL/min — ABNORMAL LOW (ref >60)
Glucose, Bld: 114 mg/dL — ABNORMAL HIGH (ref 70–99)
Potassium: 4 mmol/L (ref 3.5–5.1)
Sodium: 135 mmol/L (ref 135–145)

## 2020-01-19 LAB — CBC
HCT: 46.2 % — ABNORMAL HIGH (ref 36.0–46.0)
Hemoglobin: 16.3 g/dL — ABNORMAL HIGH (ref 12.0–15.0)
MCH: 32.1 pg (ref 26.0–34.0)
MCHC: 35.3 g/dL (ref 30.0–36.0)
MCV: 90.9 fL (ref 80.0–100.0)
Platelets: 244 10*3/uL (ref 150–400)
RBC: 5.08 MIL/uL (ref 3.87–5.11)
RDW: 12.3 % (ref 11.5–15.5)
WBC: 9 10*3/uL (ref 4.0–10.5)
nRBC: 0 % (ref 0.0–0.2)

## 2020-01-19 MED ORDER — AMLODIPINE BESYLATE 2.5 MG PO TABS
2.5000 mg | ORAL_TABLET | Freq: Every day | ORAL | Status: DC
  Administered 2020-01-19 – 2020-01-27 (×9): 2.5 mg via ORAL
  Filled 2020-01-19 (×9): qty 1

## 2020-01-19 MED ORDER — ENSURE ENLIVE PO LIQD
237.0000 mL | Freq: Two times a day (BID) | ORAL | Status: DC
  Administered 2020-01-19 – 2020-01-27 (×15): 237 mL via ORAL

## 2020-01-19 MED ORDER — METHOCARBAMOL 500 MG PO TABS
500.0000 mg | ORAL_TABLET | Freq: Three times a day (TID) | ORAL | Status: DC | PRN
  Administered 2020-01-19 – 2020-01-24 (×4): 500 mg via ORAL
  Filled 2020-01-19 (×4): qty 1

## 2020-01-19 MED ORDER — ACETAMINOPHEN 325 MG PO TABS
650.0000 mg | ORAL_TABLET | Freq: Four times a day (QID) | ORAL | Status: DC
  Administered 2020-01-19 – 2020-01-27 (×31): 650 mg via ORAL
  Filled 2020-01-19 (×31): qty 2

## 2020-01-19 NOTE — Plan of Care (Signed)

## 2020-01-19 NOTE — Plan of Care (Signed)

## 2020-01-19 NOTE — Evaluation (Signed)
Occupational Therapy Evaluation Patient Details Name: [name] MRN: [id number] DOB: 11/17/1932 Today's Date: 01/19/2020    History of Present Illness 86 year old female who lives alone and fell last evening. She was found by caregiver on 01/18/20 and brought by EMS to the hospital. Pt found to have R rib fxs, R PTX, and R scapula fx. PMH: CHF, HTN, pelvic fractures, Crohn's disease, osteoporosis.   Clinical Impression   Pt ambulated with a walker and had a caregiver to assist her with showering and IADL. Per chart, pt has baseline memory deficits. She was pleasant and easily engaged in OT evaluation. She presents with R shoulder and rib pain, decreased standing balance and generalized weakness. She requires min to total assist for ADL and moderate assist for transfers. Recommend SNF for rehab upon discharge. Will follow acutely.    Follow Up Recommendations  SNF;Supervision/Assistance - 24 hour    Equipment Recommendations   (defer to next venue)    Recommendations for Other Services       Precautions / Restrictions Precautions Precautions: Fall Required Braces or Orthoses: Sling Restrictions Weight Bearing Restrictions: Yes RUE Weight Bearing: Weight bearing as tolerated      Mobility Bed Mobility               General bed mobility comments: pt received in chair  Transfers Overall transfer level: Needs assistance Equipment used: 1 person hand held assist Transfers: Sit to/from Stand;Stand Pivot Transfers Sit to Stand: Min assist Stand pivot transfers: Mod assist       General transfer comment: assist to rise and steady    Balance Overall balance assessment: Needs assistance   Sitting balance-Leahy Scale: Fair       Standing balance-Leahy Scale: Poor Standing balance comment: mod hand held assist for dynamic balance                           ADL either performed or assessed with clinical judgement   ADL Overall ADL's : Needs  assistance/impaired Eating/Feeding: Minimal assistance;Sitting   Grooming: Oral care;Minimal assistance;Sitting   Upper Body Bathing: Moderate assistance;Sitting   Lower Body Bathing: Total assistance;Sit to/from stand   Upper Body Dressing : Moderate assistance;Sitting   Lower Body Dressing: Total assistance;Sit to/from stand                       Vision Baseline Vision/History: Wears glasses Wears Glasses: Reading only Patient Visual Report: No change from baseline       Perception     Praxis      Pertinent Vitals/Pain Pain Assessment: Faces Faces Pain Scale: Hurts even more Pain Location: R shoulder and side Pain Descriptors / Indicators: Aching Pain Intervention(s): Monitored during session;Repositioned     Hand Dominance Right   Extremity/Trunk Assessment Upper Extremity Assessment Upper Extremity Assessment: RUE deficits/detail RUE Deficits / Details: pain in shoulder, not tolerating any ROM, full AROM elbow to hand RUE: Unable to fully assess due to immobilization;Unable to fully assess due to pain RUE Coordination: decreased gross motor   Lower Extremity Assessment Lower Extremity Assessment: Defer to PT evaluation   Cervical / Trunk Assessment Cervical / Trunk Assessment: Kyphotic;Other exceptions (rib fractures)   Communication Communication Communication: No difficulties   Cognition Arousal/Alertness: Awake/alert Behavior During Therapy: WFL for tasks assessed/performed Overall Cognitive Status: Impaired/Different from baseline Area of Impairment: Memory;Safety/judgement;Problem solving;Orientation;Following commands;Awareness  Orientation Level: Disoriented to;Time   Memory: Decreased short-term memory;Decreased recall of precautions Following Commands: Follows one step commands with increased time;Follows one step commands inconsistently Safety/Judgement: Decreased awareness of safety;Decreased awareness of  deficits Awareness: Intellectual Problem Solving: Slow processing;Decreased initiation;Difficulty sequencing;Requires verbal cues;Requires tactile cues General Comments: pt repeating herself frequently, recalled events leading up to hospitalization   General Comments       Exercises     Shoulder Instructions      Home Living Family/patient expects to be discharged to:: Skilled nursing facility Living Arrangements: Alone Available Help at Discharge: Family;Available PRN/intermittently;Personal care attendant Type of Home: House Home Access: Stairs to enter [employer] of Steps: 2 Entrance Stairs-Rails: None Home Layout: Two level;Able to live on main level with bedroom/bathroom Alternate Level Stairs-Number of Steps: 12 Alternate Level Stairs-Rails: Can reach both           Home Equipment: Walker - 2 wheels;Shower seat          Prior Functioning/Environment Level of Independence: Needs assistance  Gait / Transfers Assistance Needed: pt ambulates with use of RW ADL's / Homemaking Assistance Needed: requires assistance with bathing and all IADLs            OT Problem List: Decreased strength;Decreased activity tolerance;Impaired balance (sitting and/or standing);Decreased coordination;Decreased cognition;Decreased safety awareness;Decreased knowledge of use of DME or AE;Impaired UE functional use;Pain      OT Treatment/Interventions: Self-care/ADL training;DME and/or AE instruction;Therapeutic activities;Patient/family education;Balance training;Therapeutic exercise    OT Goals(Current goals can be found in the care plan section) Acute Rehab OT Goals Patient Stated Goal: my son wants me to go to [employer]" but I want to go home OT Goal Formulation: With patient Time For Goal Achievement: 02/02/20 Potential to Achieve Goals: Good ADL Goals Pt Will Perform Eating: sitting;with set-up Pt Will Perform Grooming: with set-up;sitting Pt Will Perform  Upper Body Dressing: sitting;with min assist Pt Will Transfer to Toilet: with min assist;ambulating;bedside commode Pt Will Perform Toileting - Clothing Manipulation and hygiene: with min assist;sit to/from stand Pt/caregiver will Perform Home Exercise Program: Increased ROM;Right Upper extremity;With minimal assist (within pain tolerance)  OT Frequency: Min 2X/week   Barriers to D/C: Decreased caregiver support          Co-evaluation              AM-PAC OT "6 Clicks" Daily Activity     Outcome Measure Help from another person eating meals?: A Little Help from another person taking care of personal grooming?: A Little Help from another person toileting, which includes using toliet, bedpan, or urinal?: A Lot Help from another person bathing (including washing, rinsing, drying)?: A Lot Help from another person to put on and taking off regular upper body clothing?: A Lot Help from another person to put on and taking off regular lower body clothing?: Total 6 Click Score: 13   End of Session Equipment Utilized During Treatment: Gait belt  Activity Tolerance: Patient tolerated treatment well Patient left: in chair;with call bell/phone within reach;with chair alarm set  OT Visit Diagnosis: Unsteadiness on feet (R26.81);Other abnormalities of gait and mobility (R26.89);Pain;Muscle weakness (generalized) (M62.81);Other symptoms and signs involving cognitive function Pain - Right/Left: Right Pain - part of body: Shoulder                Time: 2353-6144 OT Time Calculation (min): 19 min Charges:  OT General Charges $OT Visit: 1 Visit OT Evaluation $OT Eval Moderate Complexity: 1 Mod  Nestor Lewandowsky, OTR/L Acute Rehabilitation  Services Pager: [phone] Office: [phone]  Malka So 01/19/2020, 2:01 PM

## 2020-01-19 NOTE — TOC Initial Note (Signed)
Transition of Care [facility]) - Initial/Assessment Note    Patient Details  Name: [name] MRN: [id number] Date of Birth: 1933-01-09  Transition of Care [facility]) CM/SW Contact:    Ella Bodo, RN Phone Number: 01/19/2020, 3:23 PM  Clinical Narrative: Patient admitted on 01/18/2020 status post fall at home, sustaining right rib fractures, right pneumothorax, and right scapula fracture.  Prior to admission patient resides at home alone with intermittent assistance from hired caregivers.  She ambulates with a walker, and per son, was in process of transitioning to an assisted living facility.  Spoke with patient's son, Orlene Erm [phone]: We discussed PT/OT recommendations of SNF at discharge for rehab.  Son/POA agreeable to SNF bed search.  Will initiate FL 2 and send out patient information; will follow up with son tomorrow with bed offers.                  Expected Discharge Plan: Skilled Nursing Facility Barriers to Discharge: Continued Medical Work up   Patient Goals and CMS Choice   CMS Medicare.gov Compare Post Acute Care list provided to:: Patient Represenative (must comment) (son) Choice offered to / list presented to : [facility] POA / Guardian  Expected Discharge Plan and Services Expected Discharge Plan: [place]   Discharge Planning Services: CM Consult Post Acute Care Choice: [place] Living arrangements for the past 2 months: Single Family Home                                      Prior Living Arrangements/Services Living arrangements for the past 2 months: Single Family Home Lives with:: Self, Other (Comment) (caregiver intermittently) Patient language and need for interpreter reviewed:: Yes Do you feel safe going back to the place where you live?: Yes      Need for Family Participation in Patient Care: Yes (Comment) Care giver support system in place?: Yes (comment)   Criminal Activity/Legal Involvement Pertinent to  Current Situation/Hospitalization: No - Comment as needed  Activities of Daily Living Home Assistive Devices/Equipment: Walker (specify type), Raised toilet seat with rails, Shower chair with back, Eyeglasses (reading glasses) ADL Screening (condition at time of admission) Patient's cognitive ability adequate to safely complete daily activities?: No Is the patient deaf or have difficulty hearing?: No Does the patient have difficulty seeing, even when wearing glasses/contacts?: No Does the patient have difficulty concentrating, remembering, or making decisions?: Yes Patient able to express need for assistance with ADLs?: Yes Does the patient have difficulty dressing or bathing?: No Independently performs ADLs?: No Communication: Independent Dressing (OT): Needs assistance Is this a change from baseline?: Change from baseline, expected to last >3 days Grooming: Independent Feeding: Independent Bathing: Needs assistance Is this a change from baseline?: Change from baseline, expected to last >3 days Toileting: Needs assistance Is this a change from baseline?: Change from baseline, expected to last >3days In/Out Bed: Needs assistance Is this a change from baseline?: Change from baseline, expected to last >3 days Walks in Home: Needs assistance Is this a change from baseline?: Pre-admission baseline Weakness of Legs: Both Weakness of Arms/Hands: Right  Permission Sought/Granted Permission sought to share information with : Facility Art therapist granted to share information with : Yes, Verbal Permission Granted              Emotional Assessment Appearance:: Appears stated age Attitude/Demeanor/Rapport: Engaged Affect (typically observed): Appropriate Orientation: : Oriented  to Self, Oriented to Place, Oriented to Situation      Admission diagnosis:  Fall [W19.XXXA] Multiple rib fractures [S22.49XA] Patient Active Problem List   Diagnosis Date Noted  .  Multiple rib fractures 01/18/2020  . Risk for falls 01/20/2018  . Weakness 12/04/2017  . Elevated blood pressure reading 12/04/2017  . Chronic diastolic CHF (congestive heart failure) ([place]) 12/04/2017  . Malnutrition of moderate degree 12/04/2017  . Chest wall pain 04/03/2017  . Shingles 03/12/2017  . Healthcare maintenance 12/18/2016  . Advance care planning 12/18/2016  . OAB (overactive bladder) 09/20/2015  . Procidentia of uterus 02/15/2015  . Vaginal vault prolapse 11/16/2014  . Multiple pelvic fractures ([place]) 07/02/2013  . Urinary incontinence 09/09/2011  . Osteoporosis 10/17/2009  . Primary hyperparathyroidism ([place]) 10/02/2009  . Vitamin D deficiency 01/06/2009  . CROHN'S DISEASE, SMALL INTESTINE 01/04/2009  . Bladder prolapse, female, acquired 01/04/2009   PCP:  Lajean Manes, MD Pharmacy:   [facility] [address] [place]), [place] - [address] [place]) [place] [address] Phone: [phone] Fax: [phone]     Social Determinants of Health (SDOH) Interventions    Readmission Risk Interventions No flowsheet data found.  Reinaldo Raddle, RN, BSN  Trauma/Neuro ICU Case Manager [phone]

## 2020-01-19 NOTE — Plan of Care (Signed)

## 2020-01-19 NOTE — NC FL2 (Addendum)
[place] LEVEL OF CARE SCREENING TOOL     IDENTIFICATION  Patient Name: [name] Birthdate: 07-28-1932 Sex: female Admission Date (Current Location): 01/18/2020  [facility] and [place] Number:  Herbalist and Address:  [facility], [place] [address] [place], [place] [address]      Provider Number: [id number]  Attending Physician Name and Address:  Md, Trauma, MD  Relative Name and Phone Number:  Orlene Erm, son [phone]    Current Level of Care: Hospital Recommended Level of Care: [place] Prior Approval Number:    Date Approved/Denied:   PASRR Number: [id number]  Discharge Plan: SNF    Current Diagnoses: Patient Active Problem List   Diagnosis Date Noted   Multiple rib fractures 01/18/2020   Risk for falls 01/20/2018   Weakness 12/04/2017   Elevated blood pressure reading 12/04/2017   Chronic diastolic CHF (congestive heart failure) ([place]) 12/04/2017   Malnutrition of moderate degree 12/04/2017   Chest wall pain 04/03/2017   Shingles 03/12/2017   Healthcare maintenance 12/18/2016   Advance care planning 12/18/2016   OAB (overactive bladder) 09/20/2015   Procidentia of uterus 02/15/2015   Vaginal vault prolapse 11/16/2014   Multiple pelvic fractures ([place]) 07/02/2013   Urinary incontinence 09/09/2011   Osteoporosis 10/17/2009   Primary hyperparathyroidism ([place]) 10/02/2009   Vitamin D deficiency 01/06/2009   CROHN'S DISEASE, SMALL INTESTINE 01/04/2009   Bladder prolapse, female, acquired 01/04/2009    Orientation RESPIRATION BLADDER Height & Weight     Self, Situation, Place  Normal Continent Weight: 47.6 kg Height:  5' 2"  (157.5 cm)  BEHAVIORAL SYMPTOMS/MOOD NEUROLOGICAL BOWEL NUTRITION STATUS      Continent Diet (regular, thin liquids)  AMBULATORY STATUS COMMUNICATION OF NEEDS Skin   Limited Assist Verbally Normal                       Personal Care  Assistance Level of Assistance  Bathing, Feeding, Dressing Bathing Assistance: Limited assistance Feeding assistance: Independent Dressing Assistance: Limited assistance     Functional Limitations Info  Sight (reading glasses) Sight Info: Adequate        SPECIAL CARE FACTORS FREQUENCY  PT (By licensed PT), OT (By licensed OT)     PT Frequency: 5 times weekly OT Frequency: 5 times weekly            Contractures Contractures Info: Not present    Additional Factors Info  Code Status, Allergies Code Status Info: Full code Allergies Info: other- not specified           Current Medications (01/19/2020):  This is the current hospital active medication list Current Facility-Administered Medications  Medication Dose Route Frequency Provider Last Rate Last Admin   0.9 % NaCl with KCl 20 mEq/ L  infusion   Intravenous Continuous Coralie Keens, MD 50 mL/hr at 01/19/20 1150 Rate Verify at 01/19/20 1150   acetaminophen (TYLENOL) tablet 650 mg  650 mg Oral Q6H Norm Parcel, PA-C   650 mg at 01/19/20 1213   amLODipine (NORVASC) tablet 2.5 mg  2.5 mg Oral Daily Norm Parcel, PA-C   2.5 mg at 01/19/20 1213   budesonide (ENTOCORT EC) 24 hr capsule 9 mg  9 mg Oral Daily Coralie Keens, MD   9 mg at 01/19/20 0851   calcium-vitamin D (OSCAL WITH D) 500-200 MG-UNIT per tablet 1 tablet  1 tablet Oral BID Coralie Keens, MD   1 tablet at 01/19/20 [phone]  donepezil (ARICEPT) tablet 10 mg  10 mg Oral QHS Coralie Keens, MD   10 mg at 01/18/20 2300   enoxaparin (LOVENOX) injection 30 mg  30 mg Subcutaneous Q24H Coralie Keens, MD       hydrALAZINE (APRESOLINE) tablet 25 mg  25 mg Oral Q8H Coralie Keens, MD   25 mg at 01/19/20 1419   HYDROmorphone (DILAUDID) injection 0.5 mg  0.5 mg Intravenous Q2H PRN Coralie Keens, MD       losartan (COZAAR) tablet 25 mg  25 mg Oral Daily Coralie Keens, MD   25 mg at 01/19/20 0851   methocarbamol (ROBAXIN) tablet 500  mg  500 mg Oral Q8H PRN Norm Parcel, PA-C       multivitamin with minerals tablet 1 tablet  1 tablet Oral Daily Coralie Keens, MD   1 tablet at 01/19/20 0851   ondansetron (ZOFRAN-ODT) disintegrating tablet 4 mg  4 mg Oral Q6H PRN Coralie Keens, MD       Or   ondansetron [facility]) injection 4 mg  4 mg Intravenous Q6H PRN Coralie Keens, MD       sertraline (ZOLOFT) tablet 25 mg  25 mg Oral Daily Coralie Keens, MD   25 mg at 01/19/20 2423   traMADol (ULTRAM) tablet 50 mg  50 mg Oral Q6H PRN Coralie Keens, MD         Discharge Medications: Please see discharge summary for a list of discharge medications.  Relevant Imaging Results:  Relevant Lab Results:   Additional Information [id number]    Reinaldo Raddle, RN, BSN  Trauma/Neuro ICU Case Manager [phone]

## 2020-01-19 NOTE — Evaluation (Signed)
Physical Therapy Evaluation Patient Details Name: [name] MRN: [id number] DOB: 1932-11-06 Today's Date: 01/19/2020   History of Present Illness  86 year old female who lives alone and fell last evening. She was found by caregiver this morning and brought by EMS to the hospital. Pt found to have R rib fxs, R PTX, and R scapula fx.  Clinical Impression  Pt presents to PT with deficits in functional mobility, gait, balance, strength, power, endurance, cognition, and with significant pain. Pt is limited by R shoulder pain at this time, and demonstrates poor memory and limited recall of precautions or situation during session. Pt requires physical assistance and verbal/tactile cues for all mobility throughout session to reduce falls risk. PT recommends the use of STEDY device for transfers with nursing staff at this time. PT recommends SNF placement at the time of discharge at this time as the pt has no caregiver support at night and remains at a high falls risk currently, even with caregiver support present.    Follow Up Recommendations SNF    Equipment Recommendations  3in1 (PT);Wheelchair (measurements PT) (if D/C home)    Recommendations for Other Services       Precautions / Restrictions Precautions Precautions: Fall Restrictions Weight Bearing Restrictions: Yes RUE Weight Bearing: Weight bearing as tolerated      Mobility  Bed Mobility Overal bed mobility: Needs Assistance Bed Mobility: Supine to Sit     Supine to sit: Mod assist        Transfers Overall transfer level: Needs assistance Equipment used: 1 person hand held assist Transfers: Sit to/from Stand;Stand Pivot Transfers Sit to Stand: Min assist Stand pivot transfers: Mod assist       General transfer comment: pt with impaired safety awareness during transfers, attempting to turn buttocks 270 degrees around to chair instead of 90 degree transfer toward R side despite PT verbal and tactile  cueing  Ambulation/Gait                Stairs            Wheelchair Mobility    Modified Rankin (Stroke Patients Only)       Balance Overall balance assessment: Needs assistance Sitting-balance support: Single extremity supported;Feet supported;No upper extremity supported Sitting balance-Leahy Scale: Fair     Standing balance support: Single extremity supported;Bilateral upper extremity supported Standing balance-Leahy Scale: Poor Standing balance comment: modA to maintain dynamic standing during transfer attempt                             Pertinent Vitals/Pain Pain Assessment: Faces Faces Pain Scale: Hurts whole lot Pain Location: R shoulder and side Pain Descriptors / Indicators: Aching Pain Intervention(s): Monitored during session    Home Living Family/patient expects to be discharged to:: Unsure Living Arrangements: Alone Available Help at Discharge: Family;Available PRN/intermittently;Personal care attendant (pt reports assistance during day, none at night) Type of Home: House Home Access: Stairs to enter Entrance Stairs-Rails: None Entrance Stairs-Number of Steps: 2 Home Layout: Two level;Able to live on main level with bedroom/bathroom Home Equipment: Gilford Rile - 2 wheels;Shower seat      Prior Function Level of Independence: Needs assistance   Gait / Transfers Assistance Needed: pt ambulates with use of RW  ADL's / Homemaking Assistance Needed: requires assistance with bathing and all IADLs        Hand Dominance   Dominant Hand: Right    Extremity/Trunk Assessment   Upper Extremity Assessment  Upper Extremity Assessment: RUE deficits/detail RUE Deficits / Details: Pt tolerating limited PROM and AROM of RUE due to pain, AROM R wrist WFL, elbow flexion/extension WFL, pt not tolerating attempts at active or passive shoulder flexion    Lower Extremity Assessment Lower Extremity Assessment: Generalized weakness    Cervical /  Trunk Assessment Cervical / Trunk Assessment: Kyphotic  Communication   Communication: No difficulties  Cognition Arousal/Alertness: Awake/alert Behavior During Therapy: WFL for tasks assessed/performed Overall Cognitive Status: No family/caregiver present to determine baseline cognitive functioning                                 General Comments: pt is disoriented to situation, she knows she fell but is unable to recall any sustained injuries. Pt with impaired short term memory, awareness, and knowledge of precautions. Pt asks this PT where he is from at least 5 times during the session, forgetting the PT's answer each time. Pt does acknowledge that she feels her thinking is off.      General Comments General comments (skin integrity, edema, etc.): VSS on RA, pt without sling in room and may benefit from one for comfort    Exercises     Assessment/Plan    PT Assessment Patient needs continued PT services  PT Problem List Decreased strength;Decreased activity tolerance;Decreased balance;Decreased mobility;Decreased range of motion;Decreased cognition;Decreased knowledge of use of DME;Decreased safety awareness;Decreased knowledge of precautions;Pain       PT Treatment Interventions DME instruction;Gait training;Stair training;Functional mobility training;Therapeutic activities;Therapeutic exercise;Balance training;Neuromuscular re-education;Cognitive remediation;Patient/family education    PT Goals (Current goals can be found in the Care Plan section)  Acute Rehab PT Goals Patient Stated Goal: To improve mobility and reduce pain PT Goal Formulation: With patient Time For Goal Achievement: 02/02/20 Potential to Achieve Goals: Good    Frequency Min 2X/week   Barriers to discharge        Co-evaluation               AM-PAC PT "6 Clicks" Mobility  Outcome Measure Help needed turning from your back to your side while in a flat bed without using bedrails?: A  Lot Help needed moving from lying on your back to sitting on the side of a flat bed without using bedrails?: A Lot Help needed moving to and from a bed to a chair (including a wheelchair)?: A Lot Help needed standing up from a chair using your arms (e.g., wheelchair or bedside chair)?: A Little Help needed to walk in hospital room?: A Lot Help needed climbing 3-5 steps with a railing? : Total 6 Click Score: 12    End of Session   Activity Tolerance: Patient limited by pain Patient left: in chair;with call bell/phone within reach;with chair alarm set Nurse Communication: Mobility status;Need for lift equipment (use of STEDY) PT Visit Diagnosis: Unsteadiness on feet (R26.81);Other abnormalities of gait and mobility (R26.89);Muscle weakness (generalized) (M62.81);History of falling (Z91.81);Pain Pain - Right/Left: Right Pain - part of body: Shoulder    Time: 9147-8295 PT Time Calculation (min) (ACUTE ONLY): 31 min   Charges:   PT Evaluation $PT Eval Moderate Complexity: 1 Mod PT Treatments $Therapeutic Activity: 8-22 mins        Zenaida Niece, PT, DPT Acute Rehabilitation Pager: [phone]   Zenaida Niece 01/19/2020, 10:21 AM

## 2020-01-19 NOTE — Progress Notes (Signed)
Initial Nutrition Assessment  DOCUMENTATION CODES:   Not applicable  INTERVENTION:   -Ensure Enlive po BID, each supplement provides 350 kcal and 20 grams of protein -MVI with minerals daily  NUTRITION DIAGNOSIS:   Increased nutrient needs related to acute illness (trauma) as evidenced by estimated needs.  GOAL:   Patient will meet greater than or equal to 90% of their needs  MONITOR:   PO intake, Supplement acceptance, Diet advancement, Labs, Weight trends, Skin, I & O's  REASON FOR ASSESSMENT:   Malnutrition Screening Tool    ASSESSMENT:   This is an 86 year old female who lives alone and fell last evening.  Pt admitted s/p fall, with injuries including multiple rib fractures (some displaced), rt tiny pneumothorax, and rt scapula fracture.   Per orthopedics notes, rt scapula fracture to be managed non-operatively.   Attempted to speak with pt via phone call to hospital room, however, no answer.   Reviewed meal completion records; noted PO 50%.   No recent wt hx to assess weight changes.   Per TOC notes, plan for SNF placement at discharge.   Medications reviewed and include 0.9% NaCl with KCl 20 mEq/L infusion @ 50 ml/hr.   Labs reviewed.   Diet Order:   Diet Order            Diet regular Room service appropriate? Yes; Fluid consistency: Thin  Diet effective now                 EDUCATION NEEDS:   No education needs have been identified at this time  Skin:  Skin Assessment: Reviewed RN Assessment  Last BM:  Unknown  Height:   Ht Readings from Last 1 Encounters:  01/18/20 5' 2"  (1.575 m)    Weight:   Wt Readings from Last 1 Encounters:  01/18/20 47.6 kg    Ideal Body Weight:  50 kg  BMI:  Body mass index is 19.2 kg/m.  Estimated Nutritional Needs:   Kcal:  1400-1600  Protein:  65-80 grams  Fluid:  > 1.4 L    Loistine Chance, RD, LDN, [place] Registered Dietitian II Certified Diabetes Care and Education Specialist Please refer to  [facility] for RD and/or RD on-call/weekend/after hours pager

## 2020-01-19 NOTE — Consult Note (Signed)
Reason for Consult:Right scap fx Referring Physician: D Lindsi [name] is an 86 y.o. female.  HPI: [name] fell at home night before last and could not get up. She was found the next morning by a caregiver and brought to the ED at [facility] for evaluation. Workup showed a right scapula and multiple rib fxs. She was admitted to the trauma service at [facility] and orthopedic surgery was consulted. She is RHD and lives alone.  Past Medical History:  Diagnosis Date   Acute diastolic (congestive) heart failure (HCC)    Bladder prolapse, female, acquired    pessary in   Chronic diastolic (congestive) heart failure (HCC)    Chronic pain syndrome    Complete uterine prolapse    Crohn's disease ([place]) 2010   Diverticulosis    E. coli UTI 06/09/2013   Fall 05/2013   Hemorrhoids    History of hypercalcemia    History of hyperglycemia    Hydronephrosis    Hyperparathyroidism    Hypertension    Multiple pelvic fractures ([place]) 07/02/2013   Osteoporosis    Pubic ramus fracture ([place])    Urinary incontinence 09/09/2011   Vitamin D deficiency     Past Surgical History:  Procedure Laterality Date   COLONOSCOPY  12/23/2008   crohn's, diverticulosis, internal hemorrhoids   HIP FRACTURE SURGERY Left    left, prosthesis   LAPAROSCOPY  10/2009   PARATHYROIDECTOMY Left 08/30/2013   Procedure: LEFT INFERIOR PARATHYROIDECTOMY;  Surgeon: Earnstine Regal, MD;  Location: [facility] ORS;  Service: General;  Laterality: Left;   RHINOPLASTY     TONSILLECTOMY      Family History  Problem Relation Age of Onset   Heart disease Father    Colon cancer Father    Osteoporosis Mother    Breast cancer Mother    Prostate cancer Brother     Social History:  reports that she has never smoked. She has never used smokeless tobacco. She reports current alcohol use. She reports that she does not use drugs.  Allergies:  Allergies  Allergen Reactions   Other     Medications: I have reviewed the  patient's current medications.  Results for orders placed or performed during the hospital encounter of 01/18/20 (from the past 48 hour(s))  Basic metabolic panel     Status: Abnormal   Collection Time: 01/18/20  2:18 PM  Result Value Ref Range   Sodium 140 135 - 145 mmol/L   Potassium 4.1 3.5 - 5.1 mmol/L   Chloride 107 98 - 111 mmol/L   CO2 23 22 - 32 mmol/L   Glucose, Bld 117 (H) 70 - 99 mg/dL    Comment: Glucose reference range applies only to samples taken after fasting for at least 8 hours.   BUN 24 (H) 8 - 23 mg/dL   Creatinine, Ser 0.76 0.44 - 1.00 mg/dL   Calcium 9.3 8.9 - 10.3 mg/dL   GFR calc non Af Amer >60 >60 mL/min   GFR calc Af Amer >60 >60 mL/min   Anion gap 10 5 - 15    Comment: Performed at [facility], [place] [address] [place], [place] [address]  CBC with Differential     Status: Abnormal   Collection Time: 01/18/20  2:18 PM  Result Value Ref Range   WBC 8.7 4.0 - 10.5 K/uL   RBC 4.75 3.87 - 5.11 MIL/uL   Hemoglobin 15.2 (H) 12.0 - 15.0 g/dL   HCT 44.2 36 - 46 %  MCV 93.1 80.0 - 100.0 fL   MCH 32.0 26.0 - 34.0 pg   MCHC 34.4 30.0 - 36.0 g/dL   RDW 12.4 11.5 - 15.5 %   Platelets 209 150 - 400 K/uL   nRBC 0.0 0.0 - 0.2 %   Neutrophils Relative % 85 %   Neutro Abs 7.4 1.7 - 7.7 K/uL   Lymphocytes Relative 8 %   Lymphs Abs 0.7 0.7 - 4.0 K/uL   Monocytes Relative 7 %   Monocytes Absolute 0.6 0 - 1 K/uL   Eosinophils Relative 0 %   Eosinophils Absolute 0.0 0 - 0 K/uL   Basophils Relative 0 %   Basophils Absolute 0.0 0 - 0 K/uL   Immature Granulocytes 0 %   Abs Immature Granulocytes 0.03 0.00 - 0.07 K/uL    Comment: Performed at [facility], [place] [address] [place], [place] [address]  CK     Status: None   Collection Time: 01/18/20  2:18 PM  Result Value Ref Range   Total CK 205 38.0 - 234.0 U/L    Comment: Performed at [facility], [place] [address], [place], [place] [address]  Hepatic function  panel     Status: Abnormal   Collection Time: 01/18/20  7:03 PM  Result Value Ref Range   Total Protein 6.8 6.5 - 8.1 g/dL   Albumin 4.1 3.5 - 5.0 g/dL   AST 25 15 - 41 U/L   ALT 18 0 - 44 U/L   Alkaline Phosphatase 57 38 - 126 U/L   Total Bilirubin 1.6 (H) 0.3 - 1.2 mg/dL   Bilirubin, Direct 0.3 (H) 0.0 - 0.2 mg/dL   Indirect Bilirubin 1.3 (H) 0.3 - 0.9 mg/dL    Comment: Performed at [facility], [place] [address] [place], [place] [address]  Protime-INR     Status: None   Collection Time: 01/18/20  7:03 PM  Result Value Ref Range   Prothrombin Time 13.4 11.4 - 15.2 seconds   INR 1.1 0.8 - 1.2    Comment: (NOTE) INR goal varies based on device and disease states. Performed at [facility], [place] [address], [place], [place] [address]   SARS Coronavirus 2 by RT PCR (hospital order, performed in [facility] hospital lab) Nasopharyngeal Nasopharyngeal Swab     Status: None   Collection Time: 01/18/20  8:03 PM   Specimen: Nasopharyngeal Swab  Result Value Ref Range   SARS Coronavirus 2 NEGATIVE NEGATIVE    Comment: (NOTE) SARS-CoV-2 target nucleic acids are NOT DETECTED.  The SARS-CoV-2 RNA is generally detectable in upper and lower respiratory specimens during the acute phase of infection. The lowest concentration of SARS-CoV-2 viral copies this assay can detect is 250 copies / mL. A negative result does not preclude SARS-CoV-2 infection and should not be used as the sole basis for treatment or other patient management decisions.  A negative result may occur with improper specimen collection / handling, submission of specimen other than nasopharyngeal swab, presence of viral mutation(s) within the areas targeted by this assay, and inadequate number of viral copies (<250 copies / mL). A negative result must be combined with clinical observations, patient history, and epidemiological information.  Fact Sheet for Patients:    StrictlyIdeas.no  Fact Sheet for Healthcare Providers: BankingDealers.co.za  This test is not yet approved or  cleared by the [place] FDA and has been authorized for detection and/or diagnosis of SARS-CoV-2 by FDA under an Emergency  Use Authorization (EUA).  This EUA will remain in effect (meaning this test can be used) for the duration of the COVID-19 declaration under Section 564(b)(1) of the Act, 21 U.S.C. section 360bbb-3(b)(1), unless the authorization is terminated or revoked sooner.  Performed at [facility], [place] [address], [place], [place] [address]   Wet prep, genital     Status: Abnormal   Collection Time: 01/18/20 10:45 PM  Result Value Ref Range   Yeast Wet Prep HPF POC NONE SEEN NONE SEEN    Comment: Swab received with less than 0.5 mL of saline, saline added to specimen, interpret results with caution.   Trich, Wet Prep NONE SEEN NONE SEEN   Clue Cells Wet Prep HPF POC PRESENT (A) NONE SEEN   WBC, Wet Prep HPF POC NONE SEEN NONE SEEN   Sperm NONE SEEN     Comment: Performed at [facility], [place] [address] [place], [place] [address]    DG Shoulder Right  Result Date: 01/18/2020 CLINICAL DATA:  Fall, right shoulder pain EXAM: RIGHT SHOULDER - 2+ VIEW COMPARISON:  None. FINDINGS: Two view radiograph of the right shoulder demonstrates a a transverse fracture of the body of the scapula. Additionally, there are fractures of the right second, third, fourth, and fifth ribs laterally, with fractures of the third and fourth ribs in multiple locations. There is associated right pleural thickening. No definite pneumothorax. Visualized proximal right humerus is intact and seated within the glenoid fossa. Clavicle is intact. IMPRESSION: 1. Transverse fracture of the body of the scapula. 2. Multiple right-sided rib fractures with associated pleural thickening. No definite pneumothorax.  Electronically Signed   By: Fidela Salisbury MD   On: 01/18/2020 16:34   CT Head Wo Contrast  Result Date: 01/18/2020 CLINICAL DATA:  Head injury, mechanical fall EXAM: CT HEAD WITHOUT CONTRAST TECHNIQUE: Contiguous axial images were obtained from the base of the skull through the vertex without intravenous contrast. COMPARISON:  03/22/2018 FINDINGS: Brain: There is normal anatomic configuration of the brain. Moderate parenchymal volume loss is again noted and appears stable since prior examination. Extensive periventricular and subcortical white matter changes are present likely reflecting the sequela of small vessel ischemia. There is mild ventriculomegaly, stable since prior examination. No evidence of acute intracranial hemorrhage or infarct. No abnormal mass effect or midline shift. No abnormal intra or extra-axial mass lesion or fluid collection. The cerebellum is unremarkable Vascular: Extensive atherosclerotic calcification is seen within the carotid siphons. No asymmetric hyperdense vasculature at the skull base. Skull: Intact Sinuses/Orbits: Orbits are unremarkable. Visualized paranasal sinuses are clear. Other: There is fluid opacification of several inferior ethmoid air cells bilaterally without associated osseous erosion. Middle ear cavities are clear. IMPRESSION: 1. No acute intracranial abnormality. 2. Stable mild ventriculomegaly. Electronically Signed   By: Fidela Salisbury MD   On: 01/18/2020 16:38   CT Cervical Spine Wo Contrast  Addendum Date: 01/18/2020   ADDENDUM REPORT: 01/18/2020 17:00 ADDENDUM: These results were called by telephone at the time of interpretation on 01/18/2020 at 4:59 pm to provider ABIGAIL HARRIS , who verbally acknowledged these results. Electronically Signed   By: Fidela Salisbury MD   On: 01/18/2020 17:00   Result Date: 01/18/2020 CLINICAL DATA:  Mechanical fall, neck trauma EXAM: CT CERVICAL SPINE WITHOUT CONTRAST TECHNIQUE: Multidetector CT imaging of the cervical  spine was performed without intravenous contrast. Multiplanar CT image reconstructions were also generated. COMPARISON:  None. FINDINGS: Alignment: There is normal cervical lordosis. No listhesis of  the cervical spine. Skull base and vertebrae: The craniocervical junction is unremarkable. The atlantodental interval is normal. Vertebral body height has been preserved. No acute fracture of the cervical spine. No lytic or blastic bone lesions are identified. Soft tissues and spinal canal: No prevertebral fluid or swelling. No visible canal hematoma. Disc levels: Sagittal reformats demonstrates moderate degenerate arthritis at the atlantodental articulation. There is moderate intervertebral disc space narrowing and endplate remodeling at H4-1 and C6-7 in keeping with changes of moderate degenerative disc disease. Intra discal calcifications with a small calcified posterior disc herniation is noted at C2-3. Review of the axial images demonstrates: C2-3: Small posterior calcified central disc herniation. No significant canal stenosis. Minimal bilateral facet arthrosis. No significant neural foraminal narrowing. C3-4: Moderate left and mild right facet arthrosis. Mild bilateral neural foraminal narrowing. No significant canal stenosis. C4-5: Moderate bilateral facet arthrosis. Mild left and moderate right neural foraminal narrowing. No significant canal stenosis. C5-6: Mild uncovertebral arthrosis and facet arthrosis results in mild bilateral neural foraminal narrowing. No significant canal stenosis. C6-7: Mild uncovertebral and facet arthrosis results in mild left neural foraminal narrowing. No significant canal stenosis. C7-T1: Moderate right facet arthrosis with hypertrophic osteophyte formation abuts the thecal sac laterally and slightly deforms the thecal sac. No significant canal stenosis. No significant neural foraminal narrowing. Upper chest: Tiny right apical pneumothorax may be present, incompletely evaluated on  this examination Other: None significant IMPRESSION: 1. No acute fracture or subluxation of the cervical spine. 2. Moderate degenerative disc disease and facet arthrosis as above. 3. Tiny right apical pneumothorax may be present, incompletely evaluated on this examination. Dedicated CT imaging of the chest is recommended for further evaluation. Electronically Signed: By: Fidela Salisbury MD On: 01/18/2020 16:55   CT CHEST ABDOMEN PELVIS W CONTRAST  Result Date: 01/18/2020 CLINICAL DATA:  Pain status post fall right arm pain. Right scapular fracture and right rib fractures noted. EXAM: CT CHEST, ABDOMEN, AND PELVIS WITH CONTRAST TECHNIQUE: Multidetector CT imaging of the chest, abdomen and pelvis was performed following the standard protocol during bolus administration of intravenous contrast. CONTRAST:  125m OMNIPAQUE IOHEXOL 300 MG/ML  SOLN COMPARISON:  CT dated December 04, 2017 FINDINGS: CT CHEST FINDINGS Cardiovascular: The heart size is normal. Mild atherosclerotic changes are noted of the thoracic aorta. There is no evidence for a dissection or aneurysm. There is no large centrally located pulmonary embolism. Mediastinum/Nodes: -- No mediastinal lymphadenopathy. -- No hilar lymphadenopathy. -- No axillary lymphadenopathy. -- No supraclavicular lymphadenopathy. --there multiple thyroid nodules measuring up to approximately 1.2 cm. No followup recommended (ref: J Am Coll Radiol. 2015 Feb;12(2): 143-50). -there is a small hiatal hernia. Lungs/Pleura: There is a trace right-sided pneumothorax. There is airspace disease at the lung bases bilaterally favored to represent atelectasis. There are small bilateral pleural effusions. The trachea is unremarkable. Musculoskeletal: Again noted is an acute, minimally displaced fracture through the scapula body. There are multiple displaced and nondisplaced right-sided rib fractures involving ribs 2 through 5. There are additional chronic appearing right-sided rib fractures.  There is subcutaneous gas along the patient's right flank. There are old healed left-sided rib fractures. CT ABDOMEN PELVIS FINDINGS Hepatobiliary: The liver is normal. Normal gallbladder.There is no biliary ductal dilation. Pancreas: Normal contours without ductal dilatation. No peripancreatic fluid collection. Spleen: Unremarkable. Adrenals/Urinary Tract: --Adrenal glands: Unremarkable. --Right kidney/ureter: No hydronephrosis or radiopaque kidney stones. --Left kidney/ureter: No hydronephrosis or radiopaque kidney stones. --Urinary bladder: Unremarkable. Stomach/Bowel: --Stomach/Duodenum: There is a small hiatal hernia. --Small bowel: There are  mild scattered areas of small-bowel wall thickening which are felt to be related to the patient's history of Crohn's disease. --Colon: Rectosigmoid diverticulosis without acute inflammation. --Appendix: Normal. Vascular/Lymphatic: Atherosclerotic calcification is present within the non-aneurysmal abdominal aorta, without hemodynamically significant stenosis. --No retroperitoneal lymphadenopathy. --No mesenteric lymphadenopathy. --No pelvic or inguinal lymphadenopathy. Reproductive: A pelvic floor support devices noted. There is an a left ovarian cystic mass measuring approximately 2.1 cm (axial series 2, image 91). No further follow-up is recommended for this finding. Other: No ascites or free air. The abdominal wall is normal. Musculoskeletal. The patient is status post prior total hip arthroplasty on the left. There are old healed pelvic fractures on the right. There is no definite acute displaced fracture on today's exam. IMPRESSION: 1. Trace right-sided pneumothorax. 2. Multiple displaced and nondisplaced right-sided rib fractures involving ribs 2 through 5. There is subcutaneous gas along the patient's right flank. 3. Acute, mildly displaced right scapular fracture. 4. Small bilateral pleural effusions with associated atelectasis. 5. No acute intra-abdominal or pelvic  injury. 6. Rectosigmoid diverticulosis without acute inflammation. Aortic Atherosclerosis (ICD10-I70.0). Electronically Signed   By: Constance Holster M.D.   On: 01/18/2020 18:22   DG Chest Port 1 View  Result Date: 01/19/2020 CLINICAL DATA:  Multiple right rib fractures. EXAM: PORTABLE CHEST 1 VIEW COMPARISON:  Chest CT 01/18/2020 FINDINGS: The heart is normal in size. Stable mild tortuosity and calcification of the thoracic aorta. Numerous displaced right upper rib fractures are again noted. No definite right-sided pneumothorax. Loss of volume in the right hemithorax. The left lung is clear except for streaky left basilar atelectasis. Significant scoliosis. IMPRESSION: Numerous displaced right upper rib fractures. No definite right-sided pneumothorax. Electronically Signed   By: Marijo Sanes M.D.   On: 01/19/2020 07:33   DG Humerus Right  Result Date: 01/18/2020 CLINICAL DATA:  Mechanical fall last night walking to bed. Right arm pain and bruising. EXAM: RIGHT HUMERUS - 2+ VIEW COMPARISON:  Shoulder radiograph performed concurrently. FINDINGS: Cortical margins of the humerus are intact. There is no evidence of fracture or other focal bone lesions. Elbow alignment is maintained. Displaced scapular fracture, better assessed on concurrent shoulder exam. There are right rib fractures. Soft tissues are unremarkable. IMPRESSION: 1. No fracture of the right humerus. 2. Right scapular fracture, better assessed on concurrent shoulder exam. Electronically Signed   By: Keith Rake M.D.   On: 01/18/2020 16:32    Review of Systems  HENT: Negative for ear discharge, ear pain, hearing loss and tinnitus.   Eyes: Negative for photophobia and pain.  Respiratory: Negative for cough and shortness of breath.   Cardiovascular: Positive for chest pain.  Gastrointestinal: Negative for abdominal pain, nausea and vomiting.  Genitourinary: Negative for dysuria, flank pain, frequency and urgency.  Musculoskeletal:  Positive for arthralgias (Right shoulder). Negative for back pain, myalgias and neck pain.  Neurological: Negative for dizziness and headaches.  Hematological: Does not bruise/bleed easily.  Psychiatric/Behavioral: The patient is not nervous/anxious.    Blood pressure 117/74, pulse 92, temperature 98.5 F (36.9 C), temperature source Oral, resp. rate 16, height 5' 2"  (1.575 m), weight 47.6 kg, SpO2 95 %. Physical Exam Constitutional:      General: She is not in acute distress.    Appearance: She is well-developed. She is not diaphoretic.  HENT:     Head: Normocephalic and atraumatic.  Eyes:     General: No scleral icterus.       Right eye: No discharge.  Left eye: No discharge.     Conjunctiva/sclera: Conjunctivae normal.  Cardiovascular:     Rate and Rhythm: Normal rate and regular rhythm.  Pulmonary:     Effort: Pulmonary effort is normal. No respiratory distress.  Musculoskeletal:     Cervical back: Normal range of motion.     Comments: Right shoulder, elbow, wrist, digits- no skin wounds, posterior shoulder mod TTP, no instability, no blocks to motion  Sens  Ax/R/M/U intact  Mot   Ax/ R/ PIN/ M/ AIN/ U intact  Rad 2+  Skin:    General: Skin is warm and dry.  Neurological:     Mental Status: She is alert.  Psychiatric:        Behavior: Behavior normal.     Assessment/Plan: Right scapula fx -- May be WBAT, no restrictions on motion. May use sling for comfort. F/u with Dr. Stann Mainland in 3 weeks. Multiple right rib fxs -- per trauma service Multiple medical problems including dementia, Crohn's disease, pelvic fractures, and osteoporosis -- per trauma service    Lisette Abu, PA-C Orthopedic Surgery [phone] 01/19/2020, 9:13 AM

## 2020-01-19 NOTE — Progress Notes (Signed)
[facility] Progress Note     Subjective: Patient found sitting in chair resting with no acute distress. Her breathing is nonlabored and she complains of general right sided pain and right upper extremity pain. She states no abdominal pain, and is tolerating diet with no n/v. Patient states she has not used IS yet since she has been here. Patient reports that she lives alone with a daughter that lives locally and a son that lives in [place]. Son has HCPOA.  Objective: Vital signs in last 24 hours: Temp:  [98 F (36.7 C)-98.7 F (37.1 C)] 98.5 F (36.9 C) (08/11 0849) Pulse Rate:  [63-92] 92 (08/11 0849) Resp:  [15-32] 16 (08/11 0849) BP: (117-170)/(74-98) 117/74 (08/11 0849) SpO2:  [95 %-97 %] 95 % (08/11 0849) Weight:  [47.6 kg] 47.6 kg (08/10 1857)    Intake/Output from previous day: No intake/output data recorded. Intake/Output this shift: Total I/O In: 240 [P.O.:240] Out: -   PE: General: pleasant, WD, WN female who is sitting in her chair in NAD HEENT: head is normocephalic, atraumatic.  Sclera are noninjected.  PERRL.  Ears and nose without any masses or lesions.  Mouth is pink and moist Heart: regular, rate, and rhythm. Systolic murmur head, Palpable radial and pedal pulses bilaterally Lungs: CTAB, no wheezes, rhonchi, or rales noted.  Respiratory effort nonlabored. Pulled 500 on IS Abd: soft, NT, ND, +BS, no masses, hernias, or organomegaly MS: right upped ext has limited ROS due to pain otherwise symmetrical with no cyanosis, clubbing, or edema. Skin: warm and dry with no masses, lesions, or rashes. Bruises to the right upper ext medially.     Lab Results:  Recent Labs    01/18/20 1418  WBC 8.7  HGB 15.2*  HCT 44.2  PLT 209   BMET Recent Labs    01/18/20 1418  NA 140  K 4.1  CL 107  CO2 23  GLUCOSE 117*  BUN 24*  CREATININE 0.76  CALCIUM 9.3   PT/INR Recent Labs    01/18/20 1903  LABPROT 13.4  INR 1.1   CMP     Component  Value Date/Time   NA 140 01/18/2020 1418   K 4.1 01/18/2020 1418   CL 107 01/18/2020 1418   CO2 23 01/18/2020 1418   GLUCOSE 117 (H) 01/18/2020 1418   BUN 24 (H) 01/18/2020 1418   CREATININE 0.76 01/18/2020 1418   CREATININE 0.74 12/01/2012 1235   CALCIUM 9.3 01/18/2020 1418   CALCIUM 11.4 (H) 10/02/2009 2302   PROT 6.8 01/18/2020 1903   ALBUMIN 4.1 01/18/2020 1903   AST 25 01/18/2020 1903   ALT 18 01/18/2020 1903   ALKPHOS 57 01/18/2020 1903   BILITOT 1.6 (H) 01/18/2020 1903   GFRNONAA >60 01/18/2020 1418   GFRAA >60 01/18/2020 1418   Lipase     Component Value Date/Time   LIPASE 43 12/03/2017 2022       Studies/Results: DG Shoulder Right  Result Date: 01/18/2020 CLINICAL DATA:  Fall, right shoulder pain EXAM: RIGHT SHOULDER - 2+ VIEW COMPARISON:  None. FINDINGS: Two view radiograph of the right shoulder demonstrates a a transverse fracture of the body of the scapula. Additionally, there are fractures of the right second, third, fourth, and fifth ribs laterally, with fractures of the third and fourth ribs in multiple locations. There is associated right pleural thickening. No definite pneumothorax. Visualized proximal right humerus is intact and seated within the glenoid fossa. Clavicle is intact. IMPRESSION: 1. Transverse fracture of the body  of the scapula. 2. Multiple right-sided rib fractures with associated pleural thickening. No definite pneumothorax. Electronically Signed   By: Fidela Salisbury MD   On: 01/18/2020 16:34   CT Head Wo Contrast  Result Date: 01/18/2020 CLINICAL DATA:  Head injury, mechanical fall EXAM: CT HEAD WITHOUT CONTRAST TECHNIQUE: Contiguous axial images were obtained from the base of the skull through the vertex without intravenous contrast. COMPARISON:  03/22/2018 FINDINGS: Brain: There is normal anatomic configuration of the brain. Moderate parenchymal volume loss is again noted and appears stable since prior examination. Extensive periventricular and  subcortical white matter changes are present likely reflecting the sequela of small vessel ischemia. There is mild ventriculomegaly, stable since prior examination. No evidence of acute intracranial hemorrhage or infarct. No abnormal mass effect or midline shift. No abnormal intra or extra-axial mass lesion or fluid collection. The cerebellum is unremarkable Vascular: Extensive atherosclerotic calcification is seen within the carotid siphons. No asymmetric hyperdense vasculature at the skull base. Skull: Intact Sinuses/Orbits: Orbits are unremarkable. Visualized paranasal sinuses are clear. Other: There is fluid opacification of several inferior ethmoid air cells bilaterally without associated osseous erosion. Middle ear cavities are clear. IMPRESSION: 1. No acute intracranial abnormality. 2. Stable mild ventriculomegaly. Electronically Signed   By: Fidela Salisbury MD   On: 01/18/2020 16:38   CT Cervical Spine Wo Contrast  Addendum Date: 01/18/2020   ADDENDUM REPORT: 01/18/2020 17:00 ADDENDUM: These results were called by telephone at the time of interpretation on 01/18/2020 at 4:59 pm to provider ABIGAIL HARRIS , who verbally acknowledged these results. Electronically Signed   By: Fidela Salisbury MD   On: 01/18/2020 17:00   Result Date: 01/18/2020 CLINICAL DATA:  Mechanical fall, neck trauma EXAM: CT CERVICAL SPINE WITHOUT CONTRAST TECHNIQUE: Multidetector CT imaging of the cervical spine was performed without intravenous contrast. Multiplanar CT image reconstructions were also generated. COMPARISON:  None. FINDINGS: Alignment: There is normal cervical lordosis. No listhesis of the cervical spine. Skull base and vertebrae: The craniocervical junction is unremarkable. The atlantodental interval is normal. Vertebral body height has been preserved. No acute fracture of the cervical spine. No lytic or blastic bone lesions are identified. Soft tissues and spinal canal: No prevertebral fluid or swelling. No visible  canal hematoma. Disc levels: Sagittal reformats demonstrates moderate degenerate arthritis at the atlantodental articulation. There is moderate intervertebral disc space narrowing and endplate remodeling at V4-0 and C6-7 in keeping with changes of moderate degenerative disc disease. Intra discal calcifications with a small calcified posterior disc herniation is noted at C2-3. Review of the axial images demonstrates: C2-3: Small posterior calcified central disc herniation. No significant canal stenosis. Minimal bilateral facet arthrosis. No significant neural foraminal narrowing. C3-4: Moderate left and mild right facet arthrosis. Mild bilateral neural foraminal narrowing. No significant canal stenosis. C4-5: Moderate bilateral facet arthrosis. Mild left and moderate right neural foraminal narrowing. No significant canal stenosis. C5-6: Mild uncovertebral arthrosis and facet arthrosis results in mild bilateral neural foraminal narrowing. No significant canal stenosis. C6-7: Mild uncovertebral and facet arthrosis results in mild left neural foraminal narrowing. No significant canal stenosis. C7-T1: Moderate right facet arthrosis with hypertrophic osteophyte formation abuts the thecal sac laterally and slightly deforms the thecal sac. No significant canal stenosis. No significant neural foraminal narrowing. Upper chest: Tiny right apical pneumothorax may be present, incompletely evaluated on this examination Other: None significant IMPRESSION: 1. No acute fracture or subluxation of the cervical spine. 2. Moderate degenerative disc disease and facet arthrosis as above. 3. Tiny right  apical pneumothorax may be present, incompletely evaluated on this examination. Dedicated CT imaging of the chest is recommended for further evaluation. Electronically Signed: By: Fidela Salisbury MD On: 01/18/2020 16:55   CT CHEST ABDOMEN PELVIS W CONTRAST  Result Date: 01/18/2020 CLINICAL DATA:  Pain status post fall right arm pain.  Right scapular fracture and right rib fractures noted. EXAM: CT CHEST, ABDOMEN, AND PELVIS WITH CONTRAST TECHNIQUE: Multidetector CT imaging of the chest, abdomen and pelvis was performed following the standard protocol during bolus administration of intravenous contrast. CONTRAST:  128m OMNIPAQUE IOHEXOL 300 MG/ML  SOLN COMPARISON:  CT dated December 04, 2017 FINDINGS: CT CHEST FINDINGS Cardiovascular: The heart size is normal. Mild atherosclerotic changes are noted of the thoracic aorta. There is no evidence for a dissection or aneurysm. There is no large centrally located pulmonary embolism. Mediastinum/Nodes: -- No mediastinal lymphadenopathy. -- No hilar lymphadenopathy. -- No axillary lymphadenopathy. -- No supraclavicular lymphadenopathy. --there multiple thyroid nodules measuring up to approximately 1.2 cm. No followup recommended (ref: J Am Coll Radiol. 2015 Feb;12(2): 143-50). -there is a small hiatal hernia. Lungs/Pleura: There is a trace right-sided pneumothorax. There is airspace disease at the lung bases bilaterally favored to represent atelectasis. There are small bilateral pleural effusions. The trachea is unremarkable. Musculoskeletal: Again noted is an acute, minimally displaced fracture through the scapula body. There are multiple displaced and nondisplaced right-sided rib fractures involving ribs 2 through 5. There are additional chronic appearing right-sided rib fractures. There is subcutaneous gas along the patient's right flank. There are old healed left-sided rib fractures. CT ABDOMEN PELVIS FINDINGS Hepatobiliary: The liver is normal. Normal gallbladder.There is no biliary ductal dilation. Pancreas: Normal contours without ductal dilatation. No peripancreatic fluid collection. Spleen: Unremarkable. Adrenals/Urinary Tract: --Adrenal glands: Unremarkable. --Right kidney/ureter: No hydronephrosis or radiopaque kidney stones. --Left kidney/ureter: No hydronephrosis or radiopaque kidney stones.  --Urinary bladder: Unremarkable. Stomach/Bowel: --Stomach/Duodenum: There is a small hiatal hernia. --Small bowel: There are mild scattered areas of small-bowel wall thickening which are felt to be related to the patient's history of Crohn's disease. --Colon: Rectosigmoid diverticulosis without acute inflammation. --Appendix: Normal. Vascular/Lymphatic: Atherosclerotic calcification is present within the non-aneurysmal abdominal aorta, without hemodynamically significant stenosis. --No retroperitoneal lymphadenopathy. --No mesenteric lymphadenopathy. --No pelvic or inguinal lymphadenopathy. Reproductive: A pelvic floor support devices noted. There is an a left ovarian cystic mass measuring approximately 2.1 cm (axial series 2, image 91). No further follow-up is recommended for this finding. Other: No ascites or free air. The abdominal wall is normal. Musculoskeletal. The patient is status post prior total hip arthroplasty on the left. There are old healed pelvic fractures on the right. There is no definite acute displaced fracture on today's exam. IMPRESSION: 1. Trace right-sided pneumothorax. 2. Multiple displaced and nondisplaced right-sided rib fractures involving ribs 2 through 5. There is subcutaneous gas along the patient's right flank. 3. Acute, mildly displaced right scapular fracture. 4. Small bilateral pleural effusions with associated atelectasis. 5. No acute intra-abdominal or pelvic injury. 6. Rectosigmoid diverticulosis without acute inflammation. Aortic Atherosclerosis (ICD10-I70.0). Electronically Signed   By: CConstance HolsterM.D.   On: 01/18/2020 18:22   DG Chest Port 1 View  Result Date: 01/19/2020 CLINICAL DATA:  Multiple right rib fractures. EXAM: PORTABLE CHEST 1 VIEW COMPARISON:  Chest CT 01/18/2020 FINDINGS: The heart is normal in size. Stable mild tortuosity and calcification of the thoracic aorta. Numerous displaced right upper rib fractures are again noted. No definite right-sided  pneumothorax. Loss of volume in the right hemithorax. The  left lung is clear except for streaky left basilar atelectasis. Significant scoliosis. IMPRESSION: Numerous displaced right upper rib fractures. No definite right-sided pneumothorax. Electronically Signed   By: Marijo Sanes M.D.   On: 01/19/2020 07:33   DG Humerus Right  Result Date: 01/18/2020 CLINICAL DATA:  Mechanical fall last night walking to bed. Right arm pain and bruising. EXAM: RIGHT HUMERUS - 2+ VIEW COMPARISON:  Shoulder radiograph performed concurrently. FINDINGS: Cortical margins of the humerus are intact. There is no evidence of fracture or other focal bone lesions. Elbow alignment is maintained. Displaced scapular fracture, better assessed on concurrent shoulder exam. There are right rib fractures. Soft tissues are unremarkable. IMPRESSION: 1. No fracture of the right humerus. 2. Right scapular fracture, better assessed on concurrent shoulder exam. Electronically Signed   By: Keith Rake M.D.   On: 01/18/2020 16:32    Anti-infectives: Anti-infectives (From admission, onward)   None       Assessment/Plan Fall Right scapula fx -- May be WBAT, no restrictions on motion. May use sling for comfort. F/u with Dr. Stann Mainland in 3 weeks. Multiple right rib fxs - shows no obvious PTX shown this morning. PT, OT, IS, pain control. Hypertension- home medications Dementia Heat failure (diastolic)- home medications Osteoporosis   FEN: reg diet, IVF VTE: SCD and lovenox ordered  ID: nothing current   Plan: Changes to medication management for pain control include: scheduled Tylenol and Robaxin. Continue to use IS. PT recommend SNF and the families goal is to get her into a long term facility as per son.    LOS: 1 day    Dalene Seltzer , PA-S 01/19/2020, 10:14 AM Please see Amion for pager number during day hours 7:00am-4:30pm

## 2020-01-19 NOTE — Progress Notes (Signed)
Orthopedic Tech Progress Note Patient Details:  [name] 04-Aug-1932 [id number]  Ortho Devices Type of Ortho Device: Shoulder immobilizer Ortho Device/Splint Location: RUE Ortho Device/Splint Interventions: Ordered   Post Interventions Patient Tolerated: Well Instructions Provided: Care of device   [name] 01/19/2020, 1:08 PM

## 2020-01-20 LAB — CBC
HCT: 41.4 % (ref 36.0–46.0)
Hemoglobin: 14 g/dL (ref 12.0–15.0)
MCH: 31.1 pg (ref 26.0–34.0)
MCHC: 33.8 g/dL (ref 30.0–36.0)
MCV: 92 fL (ref 80.0–100.0)
Platelets: 215 10*3/uL (ref 150–400)
RBC: 4.5 MIL/uL (ref 3.87–5.11)
RDW: 12.3 % (ref 11.5–15.5)
WBC: 8.2 10*3/uL (ref 4.0–10.5)
nRBC: 0 % (ref 0.0–0.2)

## 2020-01-20 LAB — BASIC METABOLIC PANEL
Anion gap: 7 (ref 5–15)
BUN: 25 mg/dL — ABNORMAL HIGH (ref 8–23)
CO2: 24 mmol/L (ref 22–32)
Calcium: 9.4 mg/dL (ref 8.9–10.3)
Chloride: 110 mmol/L (ref 98–111)
Creatinine, Ser: 0.95 mg/dL (ref 0.44–1.00)
GFR calc Af Amer: >60 mL/min (ref >60)
GFR calc non Af Amer: 55 mL/min — ABNORMAL LOW (ref >60)
Glucose, Bld: 103 mg/dL — ABNORMAL HIGH (ref 70–99)
Potassium: 4.1 mmol/L (ref 3.5–5.1)
Sodium: 141 mmol/L (ref 135–145)

## 2020-01-20 MED ORDER — DOCUSATE SODIUM 100 MG PO CAPS
100.0000 mg | ORAL_CAPSULE | Freq: Two times a day (BID) | ORAL | Status: DC
  Administered 2020-01-20 – 2020-01-27 (×15): 100 mg via ORAL
  Filled 2020-01-20 (×15): qty 1

## 2020-01-20 MED ORDER — POLYETHYLENE GLYCOL 3350 17 G PO PACK: 17.0000 g | PACK | Freq: Every day | ORAL | Status: DC | PRN

## 2020-01-20 MED ORDER — LIDOCAINE 5 % EX PTCH
1.0000 | MEDICATED_PATCH | CUTANEOUS | Status: DC
  Administered 2020-01-20 – 2020-01-27 (×8): 1 via TRANSDERMAL
  Filled 2020-01-20 (×8): qty 1

## 2020-01-20 NOTE — Plan of Care (Signed)
°  Problem: Safety: Goal: Ability to remain free from injury will improve Outcome: Progressing

## 2020-01-20 NOTE — Progress Notes (Addendum)
[facility] Progress Note     Subjective: Patient reports pain on the right side with moving around but was able to rest comfortably overnight. She denies SOB. She is oriented to self, place and situation.   Objective: Vital signs in last 24 hours: Temp:  [97.7 F (36.5 C)-98.2 F (36.8 C)] 98 F (36.7 C) (08/12 0847) Pulse Rate:  [71-82] 82 (08/12 0847) Resp:  [15-17] 16 (08/12 0847) BP: (130-155)/(68-88) 130/68 (08/12 0847) SpO2:  [94 %-97 %] 97 % (08/12 0847)    Intake/Output from previous day: 08/11 0701 - 08/12 0700 In: 1666.1 [P.O.:830; I.V.:836.1] Out: -  Intake/Output this shift: Total I/O In: 120 [P.O.:120] Out: -   PE: General: pleasant, WD, WN female who is lying in bed in NAD HEENT: head is normocephalic, atraumatic.  Sclera are noninjected.  PERRL.  Ears and nose without any masses or lesions.  Mouth is pink and moist Heart: regular, rate, and rhythm. Systolic murmur, Palpable radial and pedal pulses bilaterally Lungs: CTAB, no wheezes, rhonchi, or rales noted.  Respiratory effort nonlabored. Abd: soft, NT, ND, +BS, no masses, hernias, or organomegaly MS: right upped ext has limited ROS due to pain otherwise symmetrical with no cyanosis, clubbing, or edema. Skin: warm and dry with no masses, lesions, or rashes.    Lab Results:  Recent Labs    01/19/20 1110 01/20/20 0316  WBC 9.0 8.2  HGB 16.3* 14.0  HCT 46.2* 41.4  PLT 244 215   BMET Recent Labs    01/19/20 1110 01/20/20 0316  NA 135 141  K 4.0 4.1  CL 103 110  CO2 22 24  GLUCOSE 114* 103*  BUN 20 25*  CREATININE 0.98 0.95  CALCIUM 9.5 9.4   PT/INR Recent Labs    01/18/20 1903  LABPROT 13.4  INR 1.1   CMP     Component Value Date/Time   NA 141 01/20/2020 0316   K 4.1 01/20/2020 0316   CL 110 01/20/2020 0316   CO2 24 01/20/2020 0316   GLUCOSE 103 (H) 01/20/2020 0316   BUN 25 (H) 01/20/2020 0316   CREATININE 0.95 01/20/2020 0316   CREATININE 0.74 12/01/2012 1235    CALCIUM 9.4 01/20/2020 0316   CALCIUM 11.4 (H) 10/02/2009 2302   PROT 6.8 01/18/2020 1903   ALBUMIN 4.1 01/18/2020 1903   AST 25 01/18/2020 1903   ALT 18 01/18/2020 1903   ALKPHOS 57 01/18/2020 1903   BILITOT 1.6 (H) 01/18/2020 1903   GFRNONAA 55 (L) 01/20/2020 0316   GFRAA >60 01/20/2020 0316   Lipase     Component Value Date/Time   LIPASE 43 12/03/2017 2022       Studies/Results: DG Shoulder Right  Result Date: 01/18/2020 CLINICAL DATA:  Fall, right shoulder pain EXAM: RIGHT SHOULDER - 2+ VIEW COMPARISON:  None. FINDINGS: Two view radiograph of the right shoulder demonstrates a a transverse fracture of the body of the scapula. Additionally, there are fractures of the right second, third, fourth, and fifth ribs laterally, with fractures of the third and fourth ribs in multiple locations. There is associated right pleural thickening. No definite pneumothorax. Visualized proximal right humerus is intact and seated within the glenoid fossa. Clavicle is intact. IMPRESSION: 1. Transverse fracture of the body of the scapula. 2. Multiple right-sided rib fractures with associated pleural thickening. No definite pneumothorax. Electronically Signed   By: Fidela Salisbury MD   On: 01/18/2020 16:34   CT Head Wo Contrast  Result Date: 01/18/2020 CLINICAL DATA:  Head injury, mechanical fall EXAM: CT HEAD WITHOUT CONTRAST TECHNIQUE: Contiguous axial images were obtained from the base of the skull through the vertex without intravenous contrast. COMPARISON:  03/22/2018 FINDINGS: Brain: There is normal anatomic configuration of the brain. Moderate parenchymal volume loss is again noted and appears stable since prior examination. Extensive periventricular and subcortical white matter changes are present likely reflecting the sequela of small vessel ischemia. There is mild ventriculomegaly, stable since prior examination. No evidence of acute intracranial hemorrhage or infarct. No abnormal mass effect or  midline shift. No abnormal intra or extra-axial mass lesion or fluid collection. The cerebellum is unremarkable Vascular: Extensive atherosclerotic calcification is seen within the carotid siphons. No asymmetric hyperdense vasculature at the skull base. Skull: Intact Sinuses/Orbits: Orbits are unremarkable. Visualized paranasal sinuses are clear. Other: There is fluid opacification of several inferior ethmoid air cells bilaterally without associated osseous erosion. Middle ear cavities are clear. IMPRESSION: 1. No acute intracranial abnormality. 2. Stable mild ventriculomegaly. Electronically Signed   By: Fidela Salisbury MD   On: 01/18/2020 16:38   CT Cervical Spine Wo Contrast  Addendum Date: 01/18/2020   ADDENDUM REPORT: 01/18/2020 17:00 ADDENDUM: These results were called by telephone at the time of interpretation on 01/18/2020 at 4:59 pm to provider ABIGAIL HARRIS , who verbally acknowledged these results. Electronically Signed   By: Fidela Salisbury MD   On: 01/18/2020 17:00   Result Date: 01/18/2020 CLINICAL DATA:  Mechanical fall, neck trauma EXAM: CT CERVICAL SPINE WITHOUT CONTRAST TECHNIQUE: Multidetector CT imaging of the cervical spine was performed without intravenous contrast. Multiplanar CT image reconstructions were also generated. COMPARISON:  None. FINDINGS: Alignment: There is normal cervical lordosis. No listhesis of the cervical spine. Skull base and vertebrae: The craniocervical junction is unremarkable. The atlantodental interval is normal. Vertebral body height has been preserved. No acute fracture of the cervical spine. No lytic or blastic bone lesions are identified. Soft tissues and spinal canal: No prevertebral fluid or swelling. No visible canal hematoma. Disc levels: Sagittal reformats demonstrates moderate degenerate arthritis at the atlantodental articulation. There is moderate intervertebral disc space narrowing and endplate remodeling at I5-0 and C6-7 in keeping with changes of  moderate degenerative disc disease. Intra discal calcifications with a small calcified posterior disc herniation is noted at C2-3. Review of the axial images demonstrates: C2-3: Small posterior calcified central disc herniation. No significant canal stenosis. Minimal bilateral facet arthrosis. No significant neural foraminal narrowing. C3-4: Moderate left and mild right facet arthrosis. Mild bilateral neural foraminal narrowing. No significant canal stenosis. C4-5: Moderate bilateral facet arthrosis. Mild left and moderate right neural foraminal narrowing. No significant canal stenosis. C5-6: Mild uncovertebral arthrosis and facet arthrosis results in mild bilateral neural foraminal narrowing. No significant canal stenosis. C6-7: Mild uncovertebral and facet arthrosis results in mild left neural foraminal narrowing. No significant canal stenosis. C7-T1: Moderate right facet arthrosis with hypertrophic osteophyte formation abuts the thecal sac laterally and slightly deforms the thecal sac. No significant canal stenosis. No significant neural foraminal narrowing. Upper chest: Tiny right apical pneumothorax may be present, incompletely evaluated on this examination Other: None significant IMPRESSION: 1. No acute fracture or subluxation of the cervical spine. 2. Moderate degenerative disc disease and facet arthrosis as above. 3. Tiny right apical pneumothorax may be present, incompletely evaluated on this examination. Dedicated CT imaging of the chest is recommended for further evaluation. Electronically Signed: By: Fidela Salisbury MD On: 01/18/2020 16:55   CT CHEST ABDOMEN PELVIS W CONTRAST  Result Date:  01/18/2020 CLINICAL DATA:  Pain status post fall right arm pain. Right scapular fracture and right rib fractures noted. EXAM: CT CHEST, ABDOMEN, AND PELVIS WITH CONTRAST TECHNIQUE: Multidetector CT imaging of the chest, abdomen and pelvis was performed following the standard protocol during bolus administration of  intravenous contrast. CONTRAST:  168m OMNIPAQUE IOHEXOL 300 MG/ML  SOLN COMPARISON:  CT dated December 04, 2017 FINDINGS: CT CHEST FINDINGS Cardiovascular: The heart size is normal. Mild atherosclerotic changes are noted of the thoracic aorta. There is no evidence for a dissection or aneurysm. There is no large centrally located pulmonary embolism. Mediastinum/Nodes: -- No mediastinal lymphadenopathy. -- No hilar lymphadenopathy. -- No axillary lymphadenopathy. -- No supraclavicular lymphadenopathy. --there multiple thyroid nodules measuring up to approximately 1.2 cm. No followup recommended (ref: J Am Coll Radiol. 2015 Feb;12(2): 143-50). -there is a small hiatal hernia. Lungs/Pleura: There is a trace right-sided pneumothorax. There is airspace disease at the lung bases bilaterally favored to represent atelectasis. There are small bilateral pleural effusions. The trachea is unremarkable. Musculoskeletal: Again noted is an acute, minimally displaced fracture through the scapula body. There are multiple displaced and nondisplaced right-sided rib fractures involving ribs 2 through 5. There are additional chronic appearing right-sided rib fractures. There is subcutaneous gas along the patient's right flank. There are old healed left-sided rib fractures. CT ABDOMEN PELVIS FINDINGS Hepatobiliary: The liver is normal. Normal gallbladder.There is no biliary ductal dilation. Pancreas: Normal contours without ductal dilatation. No peripancreatic fluid collection. Spleen: Unremarkable. Adrenals/Urinary Tract: --Adrenal glands: Unremarkable. --Right kidney/ureter: No hydronephrosis or radiopaque kidney stones. --Left kidney/ureter: No hydronephrosis or radiopaque kidney stones. --Urinary bladder: Unremarkable. Stomach/Bowel: --Stomach/Duodenum: There is a small hiatal hernia. --Small bowel: There are mild scattered areas of small-bowel wall thickening which are felt to be related to the patient's history of Crohn's disease.  --Colon: Rectosigmoid diverticulosis without acute inflammation. --Appendix: Normal. Vascular/Lymphatic: Atherosclerotic calcification is present within the non-aneurysmal abdominal aorta, without hemodynamically significant stenosis. --No retroperitoneal lymphadenopathy. --No mesenteric lymphadenopathy. --No pelvic or inguinal lymphadenopathy. Reproductive: A pelvic floor support devices noted. There is an a left ovarian cystic mass measuring approximately 2.1 cm (axial series 2, image 91). No further follow-up is recommended for this finding. Other: No ascites or free air. The abdominal wall is normal. Musculoskeletal. The patient is status post prior total hip arthroplasty on the left. There are old healed pelvic fractures on the right. There is no definite acute displaced fracture on today's exam. IMPRESSION: 1. Trace right-sided pneumothorax. 2. Multiple displaced and nondisplaced right-sided rib fractures involving ribs 2 through 5. There is subcutaneous gas along the patient's right flank. 3. Acute, mildly displaced right scapular fracture. 4. Small bilateral pleural effusions with associated atelectasis. 5. No acute intra-abdominal or pelvic injury. 6. Rectosigmoid diverticulosis without acute inflammation. Aortic Atherosclerosis (ICD10-I70.0). Electronically Signed   By: CConstance HolsterM.D.   On: 01/18/2020 18:22   DG Chest Port 1 View  Result Date: 01/19/2020 CLINICAL DATA:  Multiple right rib fractures. EXAM: PORTABLE CHEST 1 VIEW COMPARISON:  Chest CT 01/18/2020 FINDINGS: The heart is normal in size. Stable mild tortuosity and calcification of the thoracic aorta. Numerous displaced right upper rib fractures are again noted. No definite right-sided pneumothorax. Loss of volume in the right hemithorax. The left lung is clear except for streaky left basilar atelectasis. Significant scoliosis. IMPRESSION: Numerous displaced right upper rib fractures. No definite right-sided pneumothorax.  Electronically Signed   By: PMarijo SanesM.D.   On: 01/19/2020 07:33   DG Humerus Right  Result Date: 01/18/2020 CLINICAL DATA:  Mechanical fall last night walking to bed. Right arm pain and bruising. EXAM: RIGHT HUMERUS - 2+ VIEW COMPARISON:  Shoulder radiograph performed concurrently. FINDINGS: Cortical margins of the humerus are intact. There is no evidence of fracture or other focal bone lesions. Elbow alignment is maintained. Displaced scapular fracture, better assessed on concurrent shoulder exam. There are right rib fractures. Soft tissues are unremarkable. IMPRESSION: 1. No fracture of the right humerus. 2. Right scapular fracture, better assessed on concurrent shoulder exam. Electronically Signed   By: Keith Rake M.D.   On: 01/18/2020 16:32    Anti-infectives: Anti-infectives (From admission, onward)   None       Assessment/Plan Fall Right scapula fx-- May be WBAT, no restrictions on motion. May use sling for comfort. F/u with Dr. Stann Mainland in 3 weeks. Multiple right rib fxs- no PTX seen on f/u CXR, stable on room air  Hypertension- home medications Dementia Chronic diastolic CHF - home medications Osteoporosis   FEN: reg diet, SLIV VTE: SCD, lovenox ID: none   Dispo: Awaiting SNF placement. Continue therapies. Patient medically stable for discharge  LOS: 2 days    Norm Parcel , [facility] 01/20/2020, 10:57 AM Please see Amion for pager number during day hours 7:00am-4:30pm

## 2020-01-20 NOTE — TOC CAGE-AID Note (Signed)
Transition of Care [facility]) - CAGE-AID Screening   Patient Details  Name: [name] MRN: [id number] Date of Birth: 06/09/1933  Transition of Care [facility]) CM/SW Contact:    Emeterio Reeve, [place] Phone Number: 01/20/2020, 4:21 PM   Clinical Narrative:  CSW met with pt at bedside. CSW introduced self and explained her role at the hospital.  Pt denied alcohol use and substance use. No resources where needed.  CAGE-AID Screening:    Have You Ever Felt You Ought to Cut Down on Your Drinking or Drug Use?: No Have People Annoyed You By Critizing Your Drinking Or Drug Use?: No Have You Felt Bad Or Guilty About Your Drinking Or Drug Use?: No Have You Ever Had a Drink or Used Drugs First Thing In The Morning to Steady Your Nerves or to Get Rid of a Hangover?: No CAGE-AID Score: 0  Substance Abuse Education Offered: Yes    Blima Ledger, [place] Social Worker [phone]

## 2020-01-20 NOTE — TOC Progression Note (Signed)
Transition of Care [facility]) - Progression Note    Patient Details  Name: [name] MRN: [id number] Date of Birth: 1932-11-28  Transition [facility] Care [facility]) CM/SW Contact  Oren Section Cleta Alberts, RN Phone Number: 01/20/2020, 4:30 Clinical Narrative: SNF bed offers given to patient's son, [name].  He states he is aware patient is medically ready for discharge and will have a decision in the morning.  Will need insurance authorization prior to discharge to SNF.  I did inform patient's son that if he chooses to take patient home, she will need 24-hour supervision, and he verbalizes understanding of this.    Expected Discharge Plan: [place] Barriers to Discharge: Continued Medical Work up  Expected Discharge Plan and Services Expected Discharge Plan: [place]   Discharge Planning Services: CM Consult Post Acute Care Choice: [place] Living arrangements for the past 2 months: Single Family Home                                       Social Determinants of Health (SDOH) Interventions    Readmission Risk Interventions No flowsheet data found.  Reinaldo Raddle, RN, BSN  Trauma/Neuro ICU Case Manager [phone]

## 2020-01-21 NOTE — Progress Notes (Signed)
OT Cancellation Note  Patient Details Name: ANALYA LOUISSAINT MRN: [id number] DOB: 21-Aug-1932   Cancelled Treatment:    Reason Eval/Treat Not Completed: Patient declined, upon arrival patient asleep. Easy to wake however patient politely declining OT stating she really needs a nap despite encouragement to participate. Patient reports up to chair earlier today. Will continue to follow.   Delbert Phenix OT OT pager: [phone]  Rosemary Holms 01/21/2020, 2:43 PM

## 2020-01-21 NOTE — Discharge Instructions (Signed)
Rib Fracture  A rib fracture is a break or crack in one of the bones of the ribs. The ribs are like a cage that goes around your upper chest. A broken or cracked rib is often painful, but most do not cause other problems. Most rib fractures usually heal on their own in 1-3 months. Follow these instructions at home: Managing pain, stiffness, and swelling  If directed, apply ice to the injured area. ? Put ice in a plastic bag. ? Place a towel between your skin and the bag. ? Leave the ice on for 20 minutes, 2-3 times a day.  Take over-the-counter and prescription medicines only as told by your doctor. Activity  Avoid activities that cause pain to the injured area. Protect your injured area.  Slowly increase activity as told by your doctor. General instructions  Do deep breathing as told by your doctor. You may be told to: ? Take deep breaths many times a day. ? Cough many times a day while hugging a pillow. ? Use a device (incentive spirometer) to do deep breathing many times a day.  Drink enough fluid to keep your pee (urine) clear or pale yellow.  Do not wear a rib belt or binder. These do not allow you to breathe deeply.  Keep all follow-up visits as told by your doctor. This is important. Contact a doctor if:  You have a fever. Get help right away if:  You have trouble breathing.  You are short of breath.  You cannot stop coughing.  You cough up thick or bloody spit (sputum).  You feel sick to your stomach (nauseous), throw up (vomit), or have belly (abdominal) pain.  Your pain gets worse and medicine does not help. Summary  A rib fracture is a break or crack in one of the bones of the ribs.  Apply ice to the injured area and take medicines for pain as told by your doctor.  Take deep breaths and cough many times a day. Hug a pillow every time you cough. This information is not intended to replace advice given to you by your health care provider. Make sure you  discuss any questions you have with your health care provider. Document Revised: 05/09/2017 Document Reviewed: 08/27/2016 Elsevier Patient Education  2020 [employer].   How To Use a Sling A sling is a type of hanging bandage. You wear it around your neck to protect an injured arm, shoulder, or other body part. You may need to wear a sling so that your injured body part does not move (is immobilized) while it heals. Keeping the injured part of your body still can lessen pain and speed up healing. Your doctor may suggest that you use a sling if you have:  A broken arm.  A broken collarbone.  A shoulder injury.  Surgery. What are the risks? Wearing a sling is safe. In some cases, wearing a sling the wrong way can:  Make your injury worse.  Cause stiffness or loss of feeling (numbness).  Affect blood flow (circulation) in your arm and hand. This can cause tingling or loss of feeling in your fingers or hands. How to use a sling Follow instructions from your doctor about how and when to wear your sling. Your doctor will show you or tell you:  How to put on the sling.  How to adjust the sling.  When and how often to wear the sling.  How to take off the sling. The way that you use  a sling depends on your injury. Follow these instructions (unless your doctor tells you other instructions):  Wear the sling so that your elbow bends to the shape of a capital letter "L" (at a 90-degree angle, also called a right angle).  Make sure the sling supports your wrist and your hand.  Adjust the sling if your fingers or hand start to tingle or lose feeling. Follow these instructions at home:  Try to not move your arm.  Do not twist, lift, or move your arm in a way that could make your injury worse.  Do not lean on your arm while you have to wear a sling.  Do not lift anything while you have to wear a sling. Contact a doctor if:  You have: ? Bruising, swelling, or pain that gets  worse. ? Pain that does not get better with medicine. ? A fever.  Your sling: ? Does not support your arm like it should. ? Gets damaged. Get help right away if:  You lose feeling in your fingers.  Your fingers: ? Are tingling. ? Turn blue. ? Feel cold to the touch.  You cannot control the bleeding from your injury.  You have shortness of breath. Summary  A sling is a type of hanging bandage. You wear it around your neck to protect an injured arm, shoulder, or other body part.  You may need to wear a sling so that your injured body part does not move (is immobilized) while it heals.  The way that you use a sling depends on your injury. Follow instructions from your doctor about how and when to wear your sling.  In general, you should wear the sling so that your elbow bends to the shape of a capital letter "L." This information is not intended to replace advice given to you by your health care provider. Make sure you discuss any questions you have with your health care provider. Document Revised: 05/09/2017 Document Reviewed: 04/17/2017 Elsevier Patient Education  2020 [employer].

## 2020-01-21 NOTE — Plan of Care (Signed)

## 2020-01-21 NOTE — Progress Notes (Signed)
[facility] Progress Note     Subjective: Patient states she has been very comfortable during her time here. Notes that the pain has subsided and she slept better last night then she has the past few nights. She feels she is doing better and she is ready for the next step in her therapy. Pain noting in right shoulder during ROM.     Objective: Vital signs in last 24 hours: Temp:  [97.6 F (36.4 C)-98.2 F (36.8 C)] 97.7 F (36.5 C) (08/13 0813) Pulse Rate:  [68-75] 75 (08/13 0813) Resp:  [16-18] 17 (08/13 0813) BP: (106-131)/(67-82) 118/74 (08/13 0813) SpO2:  [93 %-97 %] 94 % (08/13 0813)    Intake/Output from previous day: 08/12 0701 - 08/13 0700 In: 360 [P.O.:360] Out: -  Intake/Output this shift: No intake/output data recorded.  PE: General: pleasant, WD, WN female who is laying in bed in NAD HEENT: head is normocephalic, atraumatic.  Sclera are noninjected.  PERRL.  Ears and nose without any masses or lesions.  Mouth is pink and moist Heart: regular, rate, and rhythm.  Normal s1,s2. No obvious murmurs, gallops, or rubs noted.  Palpable radial and pedal pulses bilaterally Lungs: CTAB, no wheezes, rhonchi, or rales noted.  Respiratory effort nonlabored Abd: soft, NT, ND, +BS, no masses, hernias, or organomegaly MS: all 4 extremities are symmetrical with no cyanosis, clubbing, or edema. Bruising present on both upper extemities on later part. Right shoulder tender during ROM. Skin: warm and dry with no masses, lesions, or rashes Neuro: Cranial nerves 2-12 grossly intact, speech is normal Psych: A&Ox3 with an appropriate affect.   Lab Results:  Recent Labs    01/19/20 1110 01/20/20 0316  WBC 9.0 8.2  HGB 16.3* 14.0  HCT 46.2* 41.4  PLT 244 215   BMET Recent Labs    01/19/20 1110 01/20/20 0316  NA 135 141  K 4.0 4.1  CL 103 110  CO2 22 24  GLUCOSE 114* 103*  BUN 20 25*  CREATININE 0.98 0.95  CALCIUM 9.5 9.4   PT/INR Recent Labs     01/18/20 1903  LABPROT 13.4  INR 1.1   CMP     Component Value Date/Time   NA 141 01/20/2020 0316   K 4.1 01/20/2020 0316   CL 110 01/20/2020 0316   CO2 24 01/20/2020 0316   GLUCOSE 103 (H) 01/20/2020 0316   BUN 25 (H) 01/20/2020 0316   CREATININE 0.95 01/20/2020 0316   CREATININE 0.74 12/01/2012 1235   CALCIUM 9.4 01/20/2020 0316   CALCIUM 11.4 (H) 10/02/2009 2302   PROT 6.8 01/18/2020 1903   ALBUMIN 4.1 01/18/2020 1903   AST 25 01/18/2020 1903   ALT 18 01/18/2020 1903   ALKPHOS 57 01/18/2020 1903   BILITOT 1.6 (H) 01/18/2020 1903   GFRNONAA 55 (L) 01/20/2020 0316   GFRAA >60 01/20/2020 0316   Lipase     Component Value Date/Time   LIPASE 43 12/03/2017 2022       Studies/Results: No results found.  Anti-infectives: Anti-infectives (From admission, onward)   None       Assessment/Plan Fall Right scapula fx-- May be WBAT, no restrictions on motion. May use sling for comfort. F/u with Dr. Stann Mainland in 3 weeks. Multiple right rib fxs- no PTX seen on f/u CXR, stable on room air  Hypertension- home medications Dementia Chronic diastolic CHF - home medications Osteoporosis  FEN:reg diet, SLIV VTE: SCD, lovenox ID: none   Dispo:Awaiting SNF placement, follow up with son.  Continue therapies. Patient medically stable for discharge.    LOS: 3 days    Dalene Seltzer , PA-S 01/21/2020, 8:52 AM Please see Amion for pager number during day hours 7:00am-4:30pm  Patient reviewed with PA student and care plans discussed.   Norm Parcel , [facility] Surgery 01/21/2020, 9:08 AM Please see Amion for pager number during day hours 7:00am-4:30pm

## 2020-01-21 NOTE — Discharge Summary (Signed)
Physician Discharge Summary  Patient ID: [name] MRN: [id number] DOB/AGE: [birth year] 86 y.o.  Admit date: 01/18/2020 Discharge date: 01/27/2020  Discharge Diagnoses Fall  Right scapula fracture Multiple right rib fractures HTN Dementia Chronic diastolic CHF Osteoporosis   Consultants Orthopedic surgery   Procedures None   HPI: Patient is an 86 year old female who lives alone and fell last evening. She was found by caregiver morning of admission and brought by EMS to the [facility]. She reported minimal chest pain. She denied neck pain or abdominal pain. She has some short-term memory loss which is chronic but denied loss of consciousness. She denied headache. She denied shortness of breath as well. Work up in the ED revealed right rib fractures and right scapula fracture. Patient was admitted to the trauma service and transferred to [facility] Course: Orthopedic surgery consulted for scapula fracture and recommended non-operative management. PT/OT evaluated patient and recommended SNF for rehab. Follow up CXR was without PTX. On 01/27/20 patient was tolerating a diet, voiding appropriately, pain well controlled, VSS and overall felt stable for discharge to SNF. Follow up is as outlined below.   PE: General: pleasant, WD, WNfemale who is laying in bed in NAD HEENT: head is normocephalic, atraumatic. Sclera are noninjected. PERRL. Ears and nose without any masses or lesions. Mouth is pink and moist Heart: regular, rate, and rhythm. Normal s1,s2. No obvious murmurs, gallops, or rubs noted. Palpable radial and pedal pulses bilaterally Lungs: CTAB, no wheezes, rhonchi, or rales noted. Respiratory effort nonlabored Abd: soft, NT, ND, +BS, no masses, hernias, or organomegaly MS: all 4 extremities are symmetrical with no cyanosis, clubbing, or edema.Ecchymosis to BUE. Right shoulder tender during ROM. Skin: warm and dry with no masses, lesions, or rashes Neuro:  Cranial nerves 2-12 grossly intact, speech is normal Psych: A&Ox3 with an appropriate affect.   Allergies as of 01/27/2020      Reactions   Other       Medication List    TAKE these medications   acetaminophen 325 MG tablet Commonly known as: TYLENOL Take 2 tablets (650 mg total) by mouth every 6 (six) hours.   amLODipine 2.5 MG tablet Commonly known as: NORVASC Take 2.5 mg by mouth daily.   budesonide 3 MG 24 hr capsule Commonly known as: ENTOCORT EC Take 3 capsules (9 mg total) by mouth daily. What changed: how much to take   calcium-vitamin D 500-200 MG-UNIT tablet Commonly known as: OSCAL WITH D Take 1 tablet by mouth 2 (two) times daily.   docusate sodium 100 MG capsule Commonly known as: COLACE Take 1 capsule (100 mg total) by mouth 2 (two) times daily.   donepezil 10 MG tablet Commonly known as: ARICEPT Take 1 tablet daily What changed:   how much to take  how to take this  when to take this  additional instructions   hydrALAZINE 25 MG tablet Commonly known as: APRESOLINE Take 1 tablet (25 mg total) by mouth every 8 (eight) hours. What changed: when to take this   lidocaine 5 % Commonly known as: LIDODERM Place 1 patch onto the skin daily. Remove & Discard patch within 12 hours or as directed by MD   losartan 25 MG tablet Commonly known as: COZAAR Take 1 tablet by mouth once daily   methocarbamol 500 MG tablet Commonly known as: ROBAXIN Take 1 tablet (500 mg total) by mouth 3 (three) times daily.   multivitamin tablet Take 1 tablet by mouth daily.   sertraline  25 MG tablet Commonly known as: ZOLOFT Take 1 tablet by mouth once daily         Contact information for follow-up providers    Nicholes Stairs, MD. Call.   Specialty: Orthopedic Surgery Why: Call and schedule follow up regarding scapula fracture in 3 weeks  Contact information: [address] [place] [address] [phone]        Lajean Manes,  MD. Call.   Specialty: Internal Medicine Why: Call and schedule follow up for pain control and rib fractures in 1-2 weeks from hospital discharge or after discharge from rehab. Contact information: [address] [place] [address] [phone]            Contact information for after-discharge care    Destination    [facility] .   Service: Skilled Nursing Contact information: [place] [phone]                  Signed: Norm Parcel , [facility] Surgery 01/27/2020, 8:23 AM Please see Amion for pager number during day hours 7:00am-4:30pm

## 2020-01-21 NOTE — Progress Notes (Signed)
PT Cancellation Note  Patient Details Name: [name] MRN: [id number] DOB: May 31, 1933   Cancelled Treatment:    Reason Eval/Treat Not Completed: Fatigue/lethargy limiting ability to participate - Pt reports just starting to rest, requesting PT to hold today.  [place] Pager [phone]  Office [phone]    [place] 01/21/2020, 3:59 PM

## 2020-01-22 NOTE — Plan of Care (Signed)
  Problem: Activity: Goal: Risk for activity intolerance will decrease Outcome: Progressing   Problem: Nutrition: Goal: Adequate nutrition will be maintained Outcome: Progressing   Problem: Coping: Goal: Level of anxiety will decrease Outcome: Progressing   Problem: Pain Managment: Goal: General experience of comfort will improve Outcome: Progressing   Problem: Safety: Goal: Ability to remain free from injury will improve Outcome: Progressing   Problem: Skin Integrity: Goal: Risk for impaired skin integrity will decrease Outcome: Progressing

## 2020-01-22 NOTE — Progress Notes (Signed)
   Subjective/Chief Complaint: Doing well with min pain Tol Po   Objective: Vital signs in last 24 hours: Temp:  [97.7 F (36.5 C)-98.4 F (36.9 C)] 97.9 F (36.6 C) (08/14 0724) Pulse Rate:  [79-87] 79 (08/14 0724) Resp:  [14-16] 16 (08/14 0724) BP: (94-144)/(68-90) 144/89 (08/14 0724) SpO2:  [92 %-94 %] 92 % (08/14 0724) Last BM Date: 01/21/20  Intake/Output from previous day: No intake/output data recorded. Intake/Output this shift: No intake/output data recorded.  PE:  Constitutional: No acute distress, conversant, appears states age. Eyes: Anicteric sclerae, moist conjunctiva, no lid lag Lungs: Clear to auscultation bilaterally, normal respiratory effort CV: regular rate and rhythm, no murmurs, no peripheral edema, pedal pulses 2+ GI: Soft, no masses or hepatosplenomegaly, non-tender to palpation Skin: No rashes, palpation reveals normal turgor Ext: LUE in sling Psychiatric: appropriate judgment and insight, oriented to person, place, and time   Lab Results:  Recent Labs    01/19/20 1110 01/20/20 0316  WBC 9.0 8.2  HGB 16.3* 14.0  HCT 46.2* 41.4  PLT 244 215   BMET Recent Labs    01/19/20 1110 01/20/20 0316  NA 135 141  K 4.0 4.1  CL 103 110  CO2 22 24  GLUCOSE 114* 103*  BUN 20 25*  CREATININE 0.98 0.95  CALCIUM 9.5 9.4   PT/INR No results for input(s): LABPROT, INR in the last 72 hours. ABG No results for input(s): PHART, HCO3 in the last 72 hours.  Invalid input(s): PCO2, PO2  Studies/Results: No results found.  Anti-infectives: Anti-infectives (From admission, onward)   None      Assessment/Plan: Fall Right scapula fx-- May be WBAT, no restrictions on motion. May use sling for comfort. F/u with Dr. Stann Mainland in 3 weeks. Multiple right rib fxs-no PTX seen on f/u CXR, stable on room air Hypertension- home medications Dementia Chronic diastolic CHF- home medications Osteoporosis  FEN:reg diet,SLIV VTE:  SCD,lovenox ID: none   Dispo:Awaiting SNF placement, follow up with son. Continue therapies. Patient medically stable for discharge.    LOS: 4 days    Ralene Ok 01/22/2020

## 2020-01-23 NOTE — Plan of Care (Signed)
  Problem: Activity: Goal: Risk for activity intolerance will decrease Outcome: Progressing   Problem: Nutrition: Goal: Adequate nutrition will be maintained Outcome: Progressing   Problem: Coping: Goal: Level of anxiety will decrease Outcome: Progressing   Problem: Pain Managment: Goal: General experience of comfort will improve Outcome: Progressing   Problem: Safety: Goal: Ability to remain free from injury will improve Outcome: Progressing   Problem: Skin Integrity: Goal: Risk for impaired skin integrity will decrease Outcome: Progressing

## 2020-01-23 NOTE — Progress Notes (Addendum)
   Subjective/Chief Complaint: Doing well with min pain Tol PO Has 3 children - two who live nearby  Objective: Vital signs in last 24 hours: Temp:  [97.8 F (36.6 C)-98.6 F (37 C)] 98.6 F (37 C) (08/15 0807) Pulse Rate:  [69-85] 73 (08/15 0807) Resp:  [16-18] 16 (08/15 0807) BP: (125-158)/(80-86) 138/81 (08/15 0807) SpO2:  [92 %-98 %] 98 % (08/15 0807) Last BM Date: 01/21/20  Intake/Output from previous day: 08/14 0701 - 08/15 0700 In: 200 [P.O.:200] Out: 500 [Urine:500] Intake/Output this shift: No intake/output data recorded.  PE: Constitutional: No acute distress, conversant, appears states age. Eyes: Anicteric sclerae, moist conjunctiva, no lid lag Lungs: Some rhonchi on the right, IS = 700 cc CV: regular rate and rhythm, no murmurs, no peripheral edema, pedal pulses 2+ GI: Soft, Non-tender to palpation Ext: LUE in sling  Lab Results:  No results for input(s): WBC, HGB, HCT, PLT in the last 72 hours. BMET No results for input(s): NA, K, CL, CO2, GLUCOSE, BUN, CREATININE, CALCIUM in the last 72 hours. PT/INR No results for input(s): LABPROT, INR in the last 72 hours. ABG No results for input(s): PHART, HCO3 in the last 72 hours.  Invalid input(s): PCO2, PO2  Studies/Results: No results found.  Anti-infectives: Anti-infectives (From admission, onward)   None      Assessment/Plan: Fall Right scapula fx-- May be WBAT, no restrictions on motion. May use sling for comfort. F/u with Dr. Stann Mainland in 3 weeks. Multiple right rib fxs-no PTX seen on f/u CXR, stable on room air Hypertension- home medications Dementia - for me, she can give a good history and is oriented Chronic diastolic CHF- home medications Osteoporosis  FEN:reg diet,SLIV VTE: SCD,lovenox ID: none   Dispo:Awaiting SNF placement, follow up with son.   Patient medically stable for discharge.    LOS: 5 days   Shann Medal 01/23/2020

## 2020-01-23 NOTE — Plan of Care (Signed)
°  Problem: Activity: Goal: Risk for activity intolerance will decrease Outcome: Progressing   Problem: Nutrition: Goal: Adequate nutrition will be maintained Outcome: Progressing   Problem: Coping: Goal: Level of anxiety will decrease Outcome: Progressing   Problem: Pain Managment: Goal: General experience of comfort will improve Outcome: Progressing   Problem: Safety: Goal: Ability to remain free from injury will improve Outcome: Progressing   Problem: Skin Integrity: Goal: Risk for impaired skin integrity will decrease Outcome: Progressing

## 2020-01-24 LAB — SARS CORONAVIRUS 2 (TAT 6-24 HRS): SARS Coronavirus 2: NEGATIVE

## 2020-01-24 NOTE — Progress Notes (Addendum)
Occupational Therapy Treatment Patient Details Name: DIVINITY KYLER MRN: [id number] DOB: [birth year] Today's Date: 01/24/2020    History of present illness 86 year old female who lives alone and fell last evening. She was found by caregiver on 01/18/20 and brought by EMS to the hospital. Pt found to have R rib fxs, R PTX, and R scapula fx. PMH: CHF, HTN, pelvic fractures, Crohn's disease, osteoporosis.   OT comments  Pt agreeable to OOB with therapies. Very fearful of falling, requiring moderate +2 assist for transfers and to ambulate in room. Pt with bowel incontinence, assisted to [facility] and required total assist for pericare and management of undergarment. Pt left up in chair at end of session speaking to her brother on the phone. Continue to recommending SNF.  Follow Up Recommendations  SNF;Supervision/Assistance - 24 hour    Equipment Recommendations       Recommendations for Other Services      Precautions / Restrictions Precautions Precautions: Fall Required Braces or Orthoses: Sling Restrictions RUE Weight Bearing: WBAT       Mobility Bed Mobility Overal bed mobility: Needs Assistance Bed Mobility: Supine to Sit     Supine to sit: Mod assist     General bed mobility comments: assist to intiate LEs to EOB, pulled up on therapist's hand to raise trunk, increased time.  Transfers Overall transfer level: Needs assistance Equipment used: Rolling walker (2 wheeled);2 person hand held assist Transfers: Sit to/from [facility] Sit to Stand: Mod assist;+2 safety/equipment Stand pivot transfers: Mod assist;+2 safety/equipment       General transfer comment: assist to rise and steady, cues for hand placement with walker use, pt very fearful of falling    Balance Overall balance assessment: Needs assistance   Sitting balance-Leahy Scale: Fair     Standing balance support: Bilateral upper extremity supported Standing balance-Leahy Scale: Poor                              ADL either performed or assessed with clinical judgement   ADL Overall ADL's : Needs assistance/impaired                         Toilet Transfer: Moderate assistance;Stand-pivot;BSC;+2 for safety/equipment   Toileting- Clothing Manipulation and Hygiene: Total assistance;+2 for physical assistance;Sit to/from stand       Functional mobility during ADLs: +2 for physical assistance;Moderate assistance;Rolling walker General ADL Comments: Pt with bowel incontinence, unaware.     Vision       Perception     Praxis      Cognition Arousal/Alertness: Awake/alert Behavior During Therapy: Anxious Overall Cognitive Status: No family/caregiver present to determine baseline cognitive functioning Area of Impairment: Memory;Safety/judgement;Problem solving;Orientation;Following commands;Awareness                 Orientation Level: Disoriented to;Time   Memory: Decreased short-term memory;Decreased recall of precautions Following Commands: Follows one step commands with increased time;Follows one step commands inconsistently Safety/Judgement: Decreased awareness of safety;Decreased awareness of deficits Awareness: Intellectual Problem Solving: Slow processing;Decreased initiation;Difficulty sequencing;Requires verbal cues;Requires tactile cues General Comments: pt repeatedly asking therapist's names, assist needed to use phone        Exercises     Shoulder Instructions       General Comments      Pertinent Vitals/ Pain       Pain Assessment: Faces Faces Pain Scale: Hurts even more Pain Location: R shoulder  Pain Descriptors / Indicators: Sore;Grimacing;Guarding Pain Intervention(s): Monitored during session;Repositioned  Home Living                                          Prior Functioning/Environment              Frequency  Min 2X/week        Progress Toward Goals  OT Goals(current goals can  now be found in the care plan section)  Progress towards OT goals: Progressing toward goals  Acute Rehab OT Goals Patient Stated Goal: to go home when she can walk OT Goal Formulation: With patient Time For Goal Achievement: 02/02/20 Potential to Achieve Goals: Good  Plan Discharge plan remains appropriate    Co-evaluation    PT/OT/SLP Co-Evaluation/Treatment: Yes Reason for Co-Treatment: Necessary to address cognition/behavior during functional activity;For patient/therapist safety   OT goals addressed during session: ADL's and self-care;Proper use of Adaptive equipment and DME      AM-PAC OT "6 Clicks" Daily Activity     Outcome Measure   Help from another person eating meals?: A Little Help from another person taking care of personal grooming?: A Little Help from another person toileting, which includes using toliet, bedpan, or urinal?: A Lot Help from another person bathing (including washing, rinsing, drying)?: A Lot Help from another person to put on and taking off regular upper body clothing?: A Lot Help from another person to put on and taking off regular lower body clothing?: Total 6 Click Score: 13    End of Session Equipment Utilized During Treatment: Gait belt;Rolling walker;Other (comment) (slilng)  OT Visit Diagnosis: Unsteadiness on feet (R26.81);Other abnormalities of gait and mobility (R26.89);Pain;Muscle weakness (generalized) (M62.81);Other symptoms and signs involving cognitive function Pain - Right/Left: Right Pain - part of body: Shoulder   Activity Tolerance Patient tolerated treatment well   Patient Left in chair;with call bell/phone within reach;with chair alarm set   Nurse Communication Other (comment) (keep purewick out)        Time: 1100-1139 OT Time Calculation (min): 39 min  Charges: OT General Charges $OT Visit: 1 Visit OT Treatments $Self Care/Home Management : 23-37 mins  Nestor Lewandowsky, OTR/L Acute Rehabilitation  Services Pager: [phone] Office: [phone]   Malka So 01/24/2020, 12:30 PM

## 2020-01-24 NOTE — Plan of Care (Signed)
  Problem: Education: Goal: Knowledge of General Education information will improve Description Including pain rating scale, medication(s)/side effects and non-pharmacologic comfort measures Outcome: Progressing   Problem: Education: Goal: Knowledge of General Education information will improve Description Including pain rating scale, medication(s)/side effects and non-pharmacologic comfort measures Outcome: Progressing   

## 2020-01-24 NOTE — Progress Notes (Signed)
Physical Therapy Treatment Patient Details Name: [name] MRN: [id number] DOB: 12/10/1932 Today's Date: 01/24/2020    History of Present Illness 86 year old female who lives alone and fell last evening. She was found by caregiver on 01/18/20 and brought by EMS to the hospital. Pt found to have R rib fxs, R PTX, and R scapula fx. PMH: CHF, HTN, pelvic fractures, Crohn's disease, osteoporosis.    PT Comments    Pt supine in bed on arrival.  Required +2 mod assistance to mobilize and advance gt.  Pt limited due to fear and anxiety as well as pain.  Performed BM during session.  Continue to recommend snf placement at d/c.     Follow Up Recommendations  SNF     Equipment Recommendations  3in1 (PT);Wheelchair (measurements PT);Wheelchair cushion (measurements PT)    Recommendations for Other Services       Precautions / Restrictions Precautions Precautions: Fall Required Braces or Orthoses: Sling (maintain for comfort) Restrictions Weight Bearing Restrictions: Yes RUE Weight Bearing: Weight bearing as tolerated    Mobility  Bed Mobility Overal bed mobility: Needs Assistance Bed Mobility: Supine to Sit     Supine to sit: Mod assist     General bed mobility comments: assist to intiate LEs to EOB, pulled up on therapist's hand to raise trunk, increased time.  Transfers Overall transfer level: Needs assistance Equipment used: Rolling walker (2 wheeled);2 person hand held assist Transfers: Sit to/from [facility] Sit to Stand: Mod assist;+2 safety/equipment Stand pivot transfers: Mod assist;+2 safety/equipment       General transfer comment: assist to rise and steady, cues for hand placement with walker use, pt very fearful of falling  Ambulation/Gait Ambulation/Gait assistance: Max assist;+2 physical assistance Gait Distance (Feet): 4 Feet (+ 10 ft) Assistive device: Rolling walker (2 wheeled);1 person hand held assist Gait Pattern/deviations:  Step-to pattern;Wide base of support;Antalgic;Shuffle;Decreased stance time - left;Decreased stride length     General Gait Details: Pt performed pivot with B UEs and RW but poorly utilized the device to move from bed to commode.  Pt required +2 external assistance and L hand held assistance to ambulate x 10 ft.   Stairs             Wheelchair Mobility    Modified Rankin (Stroke Patients Only)       Balance Overall balance assessment: Needs assistance   Sitting balance-Leahy Scale: Fair     Standing balance support: Bilateral upper extremity supported Standing balance-Leahy Scale: Poor Standing balance comment: mod hand held assist for dynamic balance                            Cognition Arousal/Alertness: Awake/alert Behavior During Therapy: Anxious Overall Cognitive Status: No family/caregiver present to determine baseline cognitive functioning Area of Impairment: Memory;Safety/judgement;Problem solving;Orientation;Following commands;Awareness                 Orientation Level: Disoriented to;Time   Memory: Decreased short-term memory;Decreased recall of precautions Following Commands: Follows one step commands with increased time;Follows one step commands inconsistently Safety/Judgement: Decreased awareness of safety;Decreased awareness of deficits Awareness: Intellectual Problem Solving: Slow processing;Decreased initiation;Difficulty sequencing;Requires verbal cues;Requires tactile cues General Comments: pt repeatedly asking therapist's names, assist needed to use phone      Exercises      General Comments        Pertinent Vitals/Pain Pain Assessment: Faces Faces Pain Scale: Hurts whole lot Pain Location: R shoulder Pain  Descriptors / Indicators: Sore;Grimacing;Guarding Pain Intervention(s): Monitored during session;Repositioned;Limited activity within patient's tolerance    Home Living                      Prior Function             PT Goals (current goals can now be found in the care plan section) Acute Rehab PT Goals Patient Stated Goal: to go home when she can walk Potential to Achieve Goals: Good Progress towards PT goals: Progressing toward goals    Frequency    Min 2X/week      PT Plan Current plan remains appropriate    Co-evaluation PT/OT/SLP Co-Evaluation/Treatment: Yes Reason for Co-Treatment: Complexity of the patient's impairments (multi-system involvement);Necessary to address cognition/behavior during functional activity (able to advance gt with two sets of skilled hands) PT goals addressed during session: Mobility/safety with mobility OT goals addressed during session: ADL's and self-care      AM-PAC PT "6 Clicks" Mobility   Outcome Measure  Help needed turning from your back to your side while in a flat bed without using bedrails?: A Lot Help needed moving from lying on your back to sitting on the side of a flat bed without using bedrails?: A Lot Help needed moving to and from a bed to a chair (including a wheelchair)?: A Lot Help needed standing up from a chair using your arms (e.g., wheelchair or bedside chair)?: A Little Help needed to walk in hospital room?: A Lot Help needed climbing 3-5 steps with a railing? : Total 6 Click Score: 12    End of Session Equipment Utilized During Treatment: Gait belt Activity Tolerance: Patient limited by pain Patient left: in chair;with call bell/phone within reach;with chair alarm set Nurse Communication: Mobility status;Need for lift equipment (use stedy for back to bed.) PT Visit Diagnosis: Unsteadiness on feet (R26.81);Other abnormalities of gait and mobility (R26.89);Muscle weakness (generalized) (M62.81);History of falling (Z91.81);Pain Pain - Right/Left: Right Pain - part of body: Shoulder     Time: 1101-1137 PT Time Calculation (min) (ACUTE ONLY): 36 min  Charges:  $Gait Training: 8-22 mins                     Erasmo Leventhal , PTA Acute Rehabilitation Services Pager [phone] Office [phone]     Levar Fayson Eli Hose 01/24/2020, 3:47 PM

## 2020-01-24 NOTE — TOC Progression Note (Signed)
Transition of Care [facility]) - Progression Note    Patient Details  Name: [name] MRN: [id number] Date of Birth: 1932/10/03  [facility] CM/SW Contact  Oren Section Cleta Alberts, RN Phone Number: 01/24/2020, 10:23 AM  Clinical Narrative:   Son has accepted [facility] on 8/15; facility made aware and has started insurance authorization.    Expected Discharge Plan: [place] Barriers to Discharge: Continued Medical Work up  Expected Discharge Plan and Services Expected Discharge Plan: [place]   Discharge Planning Services: CM Consult Post Acute Care Choice: [place] Living arrangements for the past 2 months: Single Family Home                                       Social Determinants of Health (SDOH) Interventions    Readmission Risk Interventions No flowsheet data found.  Reinaldo Raddle, RN, BSN  Trauma/Neuro ICU Case Manager [phone]

## 2020-01-24 NOTE — Progress Notes (Signed)
[facility] Progress Note     Subjective: Patient with no acute complaints. Assisted with finding cell phone so she can call her children this AM.   Objective: Vital signs in last 24 hours: Temp:  [97.4 F (36.3 C)-97.7 F (36.5 C)] 97.7 F (36.5 C) (08/16 0424) Pulse Rate:  [71-82] 71 (08/16 0424) Resp:  [14-17] 17 (08/16 0424) BP: (134-138)/(64-73) 138/73 (08/16 0424) SpO2:  [94 %-96 %] 96 % (08/16 0424) Last BM Date: 01/21/20  Intake/Output from previous day: 08/15 0701 - 08/16 0700 In: 720 [P.O.:720] Out: 700 [Urine:700] Intake/Output this shift: Total I/O In: 240 [P.O.:240] Out: -   PE: General: pleasant, WD, WN female who is laying in bed in NAD HEENT: head is normocephalic, atraumatic.  Sclera are noninjected.  PERRL.  Ears and nose without any masses or lesions.  Mouth is pink and moist Heart: regular, rate, and rhythm.  Normal s1,s2. No obvious murmurs, gallops, or rubs noted.  Palpable radial and pedal pulses bilaterally Lungs: CTAB, no wheezes, rhonchi, or rales noted.  Respiratory effort nonlabored Abd: soft, NT, ND, +BS, no masses, hernias, or organomegaly MS: all 4 extremities are symmetrical with no cyanosis, clubbing, or edema. Ecchymosis to BUE. Right shoulder tender during ROM. Skin: warm and dry with no masses, lesions, or rashes Neuro: Cranial nerves 2-12 grossly intact, speech is normal Psych: A&Ox3 with an appropriate affect.   Lab Results:  No results for input(s): WBC, HGB, HCT, PLT in the last 72 hours. BMET No results for input(s): NA, K, CL, CO2, GLUCOSE, BUN, CREATININE, CALCIUM in the last 72 hours. PT/INR No results for input(s): LABPROT, INR in the last 72 hours. CMP     Component Value Date/Time   NA 141 01/20/2020 0316   K 4.1 01/20/2020 0316   CL 110 01/20/2020 0316   CO2 24 01/20/2020 0316   GLUCOSE 103 (H) 01/20/2020 0316   BUN 25 (H) 01/20/2020 0316   CREATININE 0.95 01/20/2020 0316   CREATININE 0.74 12/01/2012  1235   CALCIUM 9.4 01/20/2020 0316   CALCIUM 11.4 (H) 10/02/2009 2302   PROT 6.8 01/18/2020 1903   ALBUMIN 4.1 01/18/2020 1903   AST 25 01/18/2020 1903   ALT 18 01/18/2020 1903   ALKPHOS 57 01/18/2020 1903   BILITOT 1.6 (H) 01/18/2020 1903   GFRNONAA 55 (L) 01/20/2020 0316   GFRAA >60 01/20/2020 0316   Lipase     Component Value Date/Time   LIPASE 43 12/03/2017 2022       Studies/Results: No results found.  Anti-infectives: Anti-infectives (From admission, onward)   None       Assessment/Plan Fall Right scapula fx-- May be WBAT, no restrictions on motion. May use sling for comfort. F/u with Dr. Stann Mainland in 3 weeks. Multiple right rib fxs-no PTX seen on f/u CXR, stable on room air Hypertension- home medications Dementia Chronic diastolic CHF- home medications Osteoporosis  FEN:reg diet,SLIV VTE: SCD,lovenox ID: none  Dispo:Awaiting SNF placement. Continue therapies. Patient medically stable for discharge.   LOS: 6 days    Norm Parcel , [facility] 01/24/2020, 10:07 AM Please see Amion for pager number during day hours 7:00am-4:30pm

## 2020-01-25 ENCOUNTER — Ambulatory Visit: Payer: Medicare HMO | Admitting: Internal Medicine

## 2020-01-25 MED ORDER — LIDOCAINE 5 % EX PTCH: 1.0000 | MEDICATED_PATCH | CUTANEOUS | 0 refills | Status: DC

## 2020-01-25 MED ORDER — METHOCARBAMOL 500 MG PO TABS
500.0000 mg | ORAL_TABLET | Freq: Three times a day (TID) | ORAL | Status: DC
  Administered 2020-01-25 – 2020-01-27 (×6): 500 mg via ORAL
  Filled 2020-01-25 (×6): qty 1

## 2020-01-25 MED ORDER — ACETAMINOPHEN 325 MG PO TABS: 650.0000 mg | ORAL_TABLET | Freq: Four times a day (QID) | ORAL | Status: DC

## 2020-01-25 MED ORDER — METHOCARBAMOL 500 MG PO TABS: 500.0000 mg | ORAL_TABLET | Freq: Three times a day (TID) | ORAL | Status: DC

## 2020-01-25 MED ORDER — DOCUSATE SODIUM 100 MG PO CAPS: 100.0000 mg | ORAL_CAPSULE | Freq: Two times a day (BID) | ORAL | 0 refills | Status: AC

## 2020-01-25 NOTE — Progress Notes (Signed)
Nutrition Follow-up  DOCUMENTATION CODES:   Not applicable  INTERVENTION:   -Continue Ensure Enlive po BID, each supplement provides 350 kcal and 20 grams of protein -Continue MVI with minerals daily  NUTRITION DIAGNOSIS:   Increased nutrient needs related to acute illness (trauma) as evidenced by estimated needs.  Ongoing  GOAL:   Patient will meet greater than or equal to 90% of their needs  Progressing   MONITOR:   PO intake, Supplement acceptance, Diet advancement, Labs, Weight trends, Skin, I & O's  REASON FOR ASSESSMENT:   Malnutrition Screening Tool    ASSESSMENT:   This is an 86 year old female who lives alone and fell last evening.  Reviewed I/O's: +240 ml x 24 hours and +2 L since admission  Spoke with pt at bedside, who reports fair appetite. She reports she consumed a muffin, yogurt, and coffee for breakfast (did not eat eggs because they were hard and cold"). Noted meal completion 25-75%.  Pt reports that she consumed 3 meals per day PTA. She consumed a wide variety of foods at home ("regular, American food"). Pt reports that she had a degree in home economics and cooked for herself up until recently. Discussed importance of good meal and supplement intake to promote healing.   Per [facility] team notes, pt is medically stable for discharge. She is awaiting SNF placement.   Labs reviewed.   NUTRITION - FOCUSED PHYSICAL EXAM:    Most Recent Value  Orbital Region No depletion  Upper Arm Region Mild depletion  Thoracic and Lumbar Region No depletion  Buccal Region No depletion  Temple Region No depletion  Clavicle Bone Region Mild depletion  Clavicle and Acromion Bone Region No depletion  Scapular Bone Region No depletion  Dorsal Hand Mild depletion  Patellar Region No depletion  Anterior Thigh Region No depletion  Posterior Calf Region No depletion  Edema (RD Assessment) None  Hair Reviewed  Eyes Reviewed  Mouth Reviewed  Skin Reviewed  Nails  Reviewed       Diet Order:   Diet Order            Diet regular Room service appropriate? Yes; Fluid consistency: Thin  Diet effective now                 EDUCATION NEEDS:   No education needs have been identified at this time  Skin:  Skin Assessment: Reviewed RN Assessment  Last BM:  01/24/20  Height:   Ht Readings from Last 1 Encounters:  01/18/20 5' 2"  (1.575 m)    Weight:   Wt Readings from Last 1 Encounters:  01/18/20 47.6 kg    Ideal Body Weight:  50 kg  BMI:  Body mass index is 19.2 kg/m.  Estimated Nutritional Needs:   Kcal:  1400-1600  Protein:  65-80 grams  Fluid:  > 1.4 L    Loistine Chance, RD, LDN, [place] Registered Dietitian II Certified Diabetes Care and Education Specialist Please refer to [facility] for RD and/or RD on-call/weekend/after hours pager

## 2020-01-25 NOTE — Progress Notes (Signed)
[facility] Progress Note     Subjective: No acute complaints. Talked to daughter, [name], on the phone.   Objective: Vital signs in last 24 hours: Temp:  [97.4 F (36.3 C)-98.2 F (36.8 C)] 98.2 F (36.8 C) (08/17 0844) Pulse Rate:  [73-84] 84 (08/17 0844) Resp:  [14-17] 16 (08/17 0844) BP: (105-118)/(69-88) 113/69 (08/17 0844) SpO2:  [92 %-96 %] 94 % (08/17 0844) Last BM Date: 01/24/20  Intake/Output from previous day: 08/16 0701 - 08/17 0700 In: 240 [P.O.:240] Out: -  Intake/Output this shift: No intake/output data recorded.  PE: General: pleasant, WD, WNfemale who is laying in bed in NAD HEENT: head is normocephalic, atraumatic. Sclera are noninjected. PERRL. Ears and nose without any masses or lesions. Mouth is pink and moist Heart: regular, rate, and rhythm. Normal s1,s2. No obvious murmurs, gallops, or rubs noted. Palpable radial and pedal pulses bilaterally Lungs: CTAB, no wheezes, rhonchi, or rales noted. Respiratory effort nonlabored Abd: soft, NT, ND, +BS, no masses, hernias, or organomegaly MS: all 4 extremities are symmetrical with no cyanosis, clubbing, or edema.Ecchymosis to BUE. Right shoulder tender during ROM. Skin: warm and dry with no masses, lesions, or rashes Neuro: Cranial nerves 2-12 grossly intact, speech is normal Psych: A&Ox3 with an appropriate affect.   Lab Results:  No results for input(s): WBC, HGB, HCT, PLT in the last 72 hours. BMET No results for input(s): NA, K, CL, CO2, GLUCOSE, BUN, CREATININE, CALCIUM in the last 72 hours. PT/INR No results for input(s): LABPROT, INR in the last 72 hours. CMP     Component Value Date/Time   NA 141 01/20/2020 0316   K 4.1 01/20/2020 0316   CL 110 01/20/2020 0316   CO2 24 01/20/2020 0316   GLUCOSE 103 (H) 01/20/2020 0316   BUN 25 (H) 01/20/2020 0316   CREATININE 0.95 01/20/2020 0316   CREATININE 0.74 12/01/2012 1235   CALCIUM 9.4 01/20/2020 0316   CALCIUM 11.4 (H)  10/02/2009 2302   PROT 6.8 01/18/2020 1903   ALBUMIN 4.1 01/18/2020 1903   AST 25 01/18/2020 1903   ALT 18 01/18/2020 1903   ALKPHOS 57 01/18/2020 1903   BILITOT 1.6 (H) 01/18/2020 1903   GFRNONAA 55 (L) 01/20/2020 0316   GFRAA >60 01/20/2020 0316   Lipase     Component Value Date/Time   LIPASE 43 12/03/2017 2022       Studies/Results: No results found.  Anti-infectives: Anti-infectives (From admission, onward)   None       Assessment/Plan Fall Right scapula fx-- May be WBAT, no restrictions on motion. May use sling for comfort. F/u with Dr. Stann Mainland in 3 weeks. Multiple right rib fxs-no PTX seen on f/u CXR, stable on room air Hypertension- home medications Dementia Chronic diastolic CHF- home medications Osteoporosis  FEN:reg diet,SLIV VTE: SCD,lovenox ID: none  Dispo:Awaiting SNF placement.Continue therapies. Patient medically stable for discharge.  LOS: 7 days    Norm Parcel , [facility] 01/25/2020, 10:03 AM Please see Amion for pager number during day hours 7:00am-4:30pm

## 2020-01-26 NOTE — Progress Notes (Signed)
[facility] Progress Note     Subjective: No acute complaints. Feels lonely here. Pain well controlled. Very pleasant.   Objective: Vital signs in last 24 hours: Temp:  [97.7 F (36.5 C)-98 F (36.7 C)] 98 F (36.7 C) (08/18 0857) Pulse Rate:  [69-76] 69 (08/18 0857) Resp:  [17] 17 (08/18 0857) BP: (108-139)/(74-94) 135/74 (08/18 0857) SpO2:  [94 %-97 %] 94 % (08/18 0857) Last BM Date: 01/24/20  Intake/Output from previous day: 08/17 0701 - 08/18 0700 In: 480 [P.O.:480] Out: 700 [Urine:700] Intake/Output this shift: No intake/output data recorded.  PE: General: pleasant, WD, WNfemale who is laying in bed in NAD HEENT: head is normocephalic, atraumatic. Sclera are noninjected. PERRL. Ears and nose without any masses or lesions. Mouth is pink and moist Heart: regular, rate, and rhythm. Normal s1,s2. No obvious murmurs, gallops, or rubs noted. Palpable radial and pedal pulses bilaterally Lungs: CTAB, no wheezes, rhonchi, or rales noted. Respiratory effort nonlabored Abd: soft, NT, ND, +BS, no masses, hernias, or organomegaly MS: all 4 extremities are symmetrical with no cyanosis, clubbing, or edema.Ecchymosis to BUE. Right shoulder tender during ROM. Skin: warm and dry with no masses, lesions, or rashes Neuro: Cranial nerves 2-12 grossly intact, speech is normal Psych: A&Ox3 with an appropriate affect.  Lab Results:  No results for input(s): WBC, HGB, HCT, PLT in the last 72 hours. BMET No results for input(s): NA, K, CL, CO2, GLUCOSE, BUN, CREATININE, CALCIUM in the last 72 hours. PT/INR No results for input(s): LABPROT, INR in the last 72 hours. CMP     Component Value Date/Time   NA 141 01/20/2020 0316   K 4.1 01/20/2020 0316   CL 110 01/20/2020 0316   CO2 24 01/20/2020 0316   GLUCOSE 103 (H) 01/20/2020 0316   BUN 25 (H) 01/20/2020 0316   CREATININE 0.95 01/20/2020 0316   CREATININE 0.74 12/01/2012 1235   CALCIUM 9.4 01/20/2020 0316    CALCIUM 11.4 (H) 10/02/2009 2302   PROT 6.8 01/18/2020 1903   ALBUMIN 4.1 01/18/2020 1903   AST 25 01/18/2020 1903   ALT 18 01/18/2020 1903   ALKPHOS 57 01/18/2020 1903   BILITOT 1.6 (H) 01/18/2020 1903   GFRNONAA 55 (L) 01/20/2020 0316   GFRAA >60 01/20/2020 0316   Lipase     Component Value Date/Time   LIPASE 43 12/03/2017 2022       Studies/Results: No results found.  Anti-infectives: Anti-infectives (From admission, onward)   None       Assessment/Plan Fall Right scapula fx-- May be WBAT, no restrictions on motion. May use sling for comfort. F/u with Dr. Stann Mainland in 3 weeks. Multiple right rib fxs-no PTX seen on f/u CXR, stable on room air Hypertension- home medications Dementia Chronic diastolic CHF- home medications Osteoporosis  FEN:reg diet,SLIV VTE: SCD,lovenox ID: none  Dispo:Awaiting SNF placement.Continue therapies. Patient medically stable for discharge.  LOS: 8 days    Norm Parcel , [facility] 01/26/2020, 9:41 AM Please see Amion for pager number during day hours 7:00am-4:30pm

## 2020-01-26 NOTE — Plan of Care (Signed)
°  Problem: Education: Goal: Knowledge of General Education information will improve Description: Including pain rating scale, medication(s)/side effects and non-pharmacologic comfort measures Outcome: Progressing   Problem: Education: Goal: Knowledge of General Education information will improve Description: Including pain rating scale, medication(s)/side effects and non-pharmacologic comfort measures Outcome: Progressing   Problem: Education: Goal: Knowledge of General Education information will improve Description: Including pain rating scale, medication(s)/side effects and non-pharmacologic comfort measures Outcome: Progressing

## 2020-01-26 NOTE — Evaluation (Signed)
Occupational Therapy Evaluation Patient Details Name: [name] MRN: [id number] DOB: [birth year] Today's Date: 01/26/2020    History of Present Illness 86 year old female who lives alone and fell last evening. She was found by caregiver on 01/18/20 and brought by EMS to the hospital. Pt found to have R rib fxs, R PTX, and R scapula fx. PMH: CHF, HTN, pelvic fractures, Crohn's disease, osteoporosis.   Clinical Impression   Pt assisted to use BSC using moderate assistance for transfer and total assist for pericare and to manage underwear. Pt sat and completed 2 grooming activities in sitting. Encouraged pt to use R hand to brush her teeth and for bimanual tasks. Pt remains pleasantly confused. She continues to be highly fearful of falling.     Follow Up Recommendations  SNF;Supervision/Assistance - 24 hour    Equipment Recommendations  Other (comment) (defer to next venue)    Recommendations for Other Services       Precautions / Restrictions Precautions Precautions: Fall Precaution Comments: very fearful of falling Required Braces or Orthoses: Sling (R for comfort) Restrictions Weight Bearing Restrictions: No RUE Weight Bearing: Weight bearing as tolerated      Mobility Bed Mobility      General bed mobility comments: pt received in chair  Transfers Overall transfer level: Needs assistance Equipment used: Rolling walker (2 wheeled) Transfers: Sit to/from Stand Sit to Stand: Mod assist Stand pivot transfers: Mod assist       General transfer comment: assist to rise and steady    Balance Overall balance assessment: Needs assistance Sitting-balance support: Feet supported;Single extremity supported Sitting balance-Leahy Scale: Fair     Standing balance support: Bilateral upper extremity supported Standing balance-Leahy Scale: Poor Standing balance comment: used back of chair for pt to stabilize when standing for pericare and management of underwear                            ADL either performed or assessed with clinical judgement   ADL Overall ADL's : Needs assistance/impaired     Grooming: Minimal assistance;Oral care;Brushing hair;Sitting               Lower Body Dressing: Total assistance;Sit to/from stand Lower Body Dressing Details (indicate cue type and reason): to manage underwear Toilet Transfer: Moderate assistance;Stand-pivot;BSC   Toileting- Clothing Manipulation and Hygiene: Total assistance;Sit to/from stand               Vision         Perception     Praxis      Pertinent Vitals/Pain Pain Assessment: Faces Faces Pain Scale: Hurts even more Pain Location: R shoulder Pain Descriptors / Indicators: Sore;Grimacing;Guarding Pain Intervention(s): Monitored during session;Repositioned     Hand Dominance     Extremity/Trunk Assessment Upper Extremity Assessment Upper Extremity Assessment: RUE deficits/detail RUE Deficits / Details: encouraged pt to use R hand to open toothpaste, hold toothbrush RUE: Unable to fully assess due to pain RUE Coordination: decreased gross motor           Communication     Cognition Arousal/Alertness: Awake/alert Behavior During Therapy: Anxious Overall Cognitive Status: No family/caregiver present to determine baseline cognitive functioning Area of Impairment: Memory;Safety/judgement;Problem solving;Orientation;Following commands;Awareness                 Orientation Level: Disoriented to;Time;Situation   Memory: Decreased short-term memory Following Commands: Follows one step commands with increased time;Follows one step commands inconsistently Safety/Judgement: Decreased awareness of  safety;Decreased awareness of deficits Awareness: Intellectual Problem Solving: Slow processing;Decreased initiation;Difficulty sequencing;Requires verbal cues;Requires tactile cues General Comments: pt easily distracted, requiring cues to continue ADL task for  thoroughness   General Comments       Exercises    Shoulder Instructions      Home Living                                          Prior Functioning/Environment                   OT Problem List:        OT Treatment/Interventions:      OT Goals(Current goals can be found in the care plan section) Acute Rehab OT Goals Patient Stated Goal: to go home when she can walk OT Goal Formulation: With patient Time For Goal Achievement: 02/02/20 Potential to Achieve Goals: Good  OT Frequency: Min 2X/week   Barriers to D/C:            Co-evaluation              AM-PAC OT "6 Clicks" Daily Activity     Outcome Measure Help from another person eating meals?: A Little Help from another person taking care of personal grooming?: A Little Help from another person toileting, which includes using toliet, bedpan, or urinal?: A Lot Help from another person bathing (including washing, rinsing, drying)?: A Lot Help from another person to put on and taking off regular upper body clothing?: A Lot Help from another person to put on and taking off regular lower body clothing?: Total 6 Click Score: 13   End of Session    Activity Tolerance: Patient tolerated treatment well Patient left: in chair;with call bell/phone within reach;with chair alarm set  OT Visit Diagnosis: Unsteadiness on feet (R26.81);Other abnormalities of gait and mobility (R26.89);Pain;Muscle weakness (generalized) (M62.81);Other symptoms and signs involving cognitive function Pain - Right/Left: Right Pain - part of body: Shoulder                Time: 1510-1526 OT Time Calculation (min): 16 min Charges:  OT General Charges $OT Visit: 1 Visit OT Treatments $Self Care/Home Management : 8-22 mins  Nestor Lewandowsky, OTR/L Acute Rehabilitation Services Pager: [phone] Office: [phone]  [name] 01/26/2020, 3:37 PM

## 2020-01-26 NOTE — Progress Notes (Signed)
Physical Therapy Treatment Patient Details Name: [name] MRN: [id number] DOB: [birth year] Today's Date: 01/26/2020    History of Present Illness 86 year old female who lives alone and fell last evening. She was found by caregiver on 01/18/20 and brought by EMS to the hospital. Pt found to have R rib fxs, R PTX, and R scapula fx. PMH: CHF, HTN, pelvic fractures, Crohn's disease, osteoporosis.    PT Comments    Pt remains very fearful of falling with STM deficits (asking me the same question multiple times).  She was able to walk across the room to the chair with me with mod assist and the RW.  She continues to report R shoulder pain and is appropriate for post acute rehab at discharge.  PT will continue to follow acutely for safe mobility progression.   Follow Up Recommendations  SNF     Equipment Recommendations  Wheelchair (measurements PT);Wheelchair cushion (measurements PT);3in1 (PT);Rolling walker with 5" wheels;Hospital bed    Recommendations for Other Services       Precautions / Restrictions Precautions Precautions: Fall Precaution Comments: very fearful of falling Required Braces or Orthoses: Splint/Cast (R side for comfort) Restrictions Weight Bearing Restrictions: No RUE Weight Bearing: Weight bearing as tolerated    Mobility  Bed Mobility Overal bed mobility: Needs Assistance Bed Mobility: Supine to Sit     Supine to sit: Min assist     General bed mobility comments: min hand held assist to give pt a hand to pull to sitting HOB ~30 degrees.   Transfers Overall transfer level: Needs assistance Equipment used: Rolling walker (2 wheeled) Transfers: Sit to/from Stand Sit to Stand: Min assist         General transfer comment: Heavy min assist to stand to RW. pt rotated towards her right side.  Manual assist to get her hands placed on the handles of the RW in the correct place.   Ambulation/Gait Ambulation/Gait assistance: Mod assist Gait Distance  (Feet): 10 Feet Assistive device: Rolling walker (2 wheeled);1 person hand held assist Gait Pattern/deviations: Step-to pattern Gait velocity: decreased   General Gait Details: Pt with attempts at walking takes a big step on her left foot and then a small, rotated to the right (with her whole trunk) step on her right foot, improved with cues to take big steps on the right (unfortunately this ended up with her also taking bigger steps on the left too).    Stairs             Wheelchair Mobility    Modified Rankin (Stroke Patients Only)       Balance Overall balance assessment: Needs assistance Sitting-balance support: Feet supported;Single extremity supported Sitting balance-Leahy Scale: Fair     Standing balance support: Bilateral upper extremity supported Standing balance-Leahy Scale: Poor Standing balance comment: needs support from RW and assistive device in standing.                             Cognition Arousal/Alertness: Awake/alert Behavior During Therapy: Anxious Overall Cognitive Status: No family/caregiver present to determine baseline cognitive functioning                       Memory: Decreased short-term memory         General Comments: Pt with very short reset on STM, asking me the same question 3 times during the session without any recognition that she already asked.  Exercises General Exercises - Lower Extremity Ankle Circles/Pumps: AROM;Both;20 reps Heel Slides: AROM;Both;10 reps Straight Leg Raises: AROM;Both;10 reps Other Exercises Other Exercises: Incentive spirometer x10 750 mL max inspired volume with cues to inhale instead of exhale.     General Comments        Pertinent Vitals/Pain Pain Assessment: Faces Faces Pain Scale: Hurts little more Pain Location: R shoulder Pain Descriptors / Indicators: Sore;Grimacing;Guarding Pain Intervention(s): Limited activity within patient's tolerance;Monitored during  session;Repositioned    Home Living                      Prior Function            PT Goals (current goals can now be found in the care plan section) Acute Rehab PT Goals Patient Stated Goal: to go home when she can walk Progress towards PT goals: Progressing toward goals    Frequency    Min 2X/week      PT Plan Current plan remains appropriate    Co-evaluation              AM-PAC PT "6 Clicks" Mobility   Outcome Measure  Help needed turning from your back to your side while in a flat bed without using bedrails?: A Little Help needed moving from lying on your back to sitting on the side of a flat bed without using bedrails?: A Little Help needed moving to and from a bed to a chair (including a wheelchair)?: A Lot Help needed standing up from a chair using your arms (e.g., wheelchair or bedside chair)?: A Little Help needed to walk in hospital room?: A Lot Help needed climbing 3-5 steps with a railing? : Total 6 Click Score: 14    End of Session Equipment Utilized During Treatment: Gait belt Activity Tolerance: Patient limited by pain Patient left: in chair;with call bell/phone within reach;with chair alarm set   PT Visit Diagnosis: Unsteadiness on feet (R26.81);Other abnormalities of gait and mobility (R26.89);Muscle weakness (generalized) (M62.81);History of falling (Z91.81);Pain Pain - Right/Left: Right Pain - part of body: Shoulder     Time: 7622-6333 PT Time Calculation (min) (ACUTE ONLY): 27 min  Charges:  $Therapeutic Exercise: 8-22 mins $Therapeutic Activity: 8-22 mins                     Verdene Lennert, PT, DPT  Acute Rehabilitation [phone] pager [phone]) [phone] office

## 2020-01-27 DIAGNOSIS — K5010 Crohn's disease of large intestine without complications: Secondary | ICD-10-CM | POA: Diagnosis not present

## 2020-01-27 DIAGNOSIS — W19XXXA Unspecified fall, initial encounter: Secondary | ICD-10-CM | POA: Diagnosis not present

## 2020-01-27 DIAGNOSIS — R69 Illness, unspecified: Secondary | ICD-10-CM | POA: Diagnosis not present

## 2020-01-27 DIAGNOSIS — R410 Disorientation, unspecified: Secondary | ICD-10-CM | POA: Diagnosis not present

## 2020-01-27 DIAGNOSIS — R6889 Other general symptoms and signs: Secondary | ICD-10-CM | POA: Diagnosis not present

## 2020-01-27 DIAGNOSIS — S42001D Fracture of unspecified part of right clavicle, subsequent encounter for fracture with routine healing: Secondary | ICD-10-CM | POA: Diagnosis not present

## 2020-01-27 DIAGNOSIS — U071 COVID-19: Secondary | ICD-10-CM | POA: Diagnosis not present

## 2020-01-27 DIAGNOSIS — Z7401 Bed confinement status: Secondary | ICD-10-CM | POA: Diagnosis not present

## 2020-01-27 DIAGNOSIS — S42101A Fracture of unspecified part of scapula, right shoulder, initial encounter for closed fracture: Secondary | ICD-10-CM | POA: Diagnosis not present

## 2020-01-27 DIAGNOSIS — S2241XA Multiple fractures of ribs, right side, initial encounter for closed fracture: Secondary | ICD-10-CM | POA: Diagnosis not present

## 2020-01-27 DIAGNOSIS — R278 Other lack of coordination: Secondary | ICD-10-CM | POA: Diagnosis not present

## 2020-01-27 DIAGNOSIS — Z4789 Encounter for other orthopedic aftercare: Secondary | ICD-10-CM | POA: Diagnosis not present

## 2020-01-27 DIAGNOSIS — S2241XD Multiple fractures of ribs, right side, subsequent encounter for fracture with routine healing: Secondary | ICD-10-CM | POA: Diagnosis not present

## 2020-01-27 DIAGNOSIS — R413 Other amnesia: Secondary | ICD-10-CM | POA: Diagnosis not present

## 2020-01-27 DIAGNOSIS — R0902 Hypoxemia: Secondary | ICD-10-CM | POA: Diagnosis not present

## 2020-01-27 DIAGNOSIS — R296 Repeated falls: Secondary | ICD-10-CM | POA: Diagnosis not present

## 2020-01-27 DIAGNOSIS — M2550 Pain in unspecified joint: Secondary | ICD-10-CM | POA: Diagnosis not present

## 2020-01-27 DIAGNOSIS — I5032 Chronic diastolic (congestive) heart failure: Secondary | ICD-10-CM | POA: Diagnosis not present

## 2020-01-27 DIAGNOSIS — R2689 Other abnormalities of gait and mobility: Secondary | ICD-10-CM | POA: Diagnosis not present

## 2020-01-27 DIAGNOSIS — R2681 Unsteadiness on feet: Secondary | ICD-10-CM | POA: Diagnosis not present

## 2020-01-27 DIAGNOSIS — I10 Essential (primary) hypertension: Secondary | ICD-10-CM | POA: Diagnosis not present

## 2020-01-27 DIAGNOSIS — M6281 Muscle weakness (generalized): Secondary | ICD-10-CM | POA: Diagnosis not present

## 2020-01-27 DIAGNOSIS — S42101D Fracture of unspecified part of scapula, right shoulder, subsequent encounter for fracture with routine healing: Secondary | ICD-10-CM | POA: Diagnosis not present

## 2020-01-27 NOTE — Progress Notes (Signed)
Attempted to give report to [facility] twice. Left call back number for nurse the second time.

## 2020-01-27 NOTE — Care Management Important Message (Signed)
Important Message  Patient Details  Name: [name] MRN: [id number] Date of Birth: 1933/03/25   Medicare Important Message Given:  Yes     Orbie Pyo 01/27/2020, 3:07 PM

## 2020-01-27 NOTE — TOC Transition Note (Signed)
Transition of Care [facility]) - CM/SW Discharge Note   Patient Details  Name: [name] MRN: [id number] Date of Birth: [birth year]  [facility] CM/SW Contact:  Ella Bodo, RN Phone Number: 01/27/2020, 12:29 PM   Clinical Narrative:  Pt medically stable for discharge today and insurance auth received for dc to SNF.  Pt to dc to [facility] [place], per family's choice.  Son, [name] is aware and will go to facility to sign paperwork.  DC summary sent to facility.  Pt discharging to room 110 at facility; bedside nurse to call report to [phone].  [facility] notified of need for pickup at 12:35pm.       Final next level of care: Skilled Nursing Facility Barriers to Discharge: Barriers Resolved   Patient Goals and CMS Choice   CMS Medicare.gov Compare Post Acute Care list provided to:: Patient Represenative (must comment) (son) Choice offered to / list presented to : [name] POA / Guardian  Discharge Placement   Existing PASRR number confirmed : 01/19/20          Patient chooses bed at: Other - please specify in the comment section below: ([facility] [place]) Patient to be transferred to facility by: [facility] Name of family member notified: Orlene Erm, son Patient and family notified of of transfer: 01/27/20  Discharge Plan and Services   Discharge Planning Services: CM Consult Post Acute Care Choice: [facility]                               Social Determinants of Health (SDOH) Interventions     Readmission Risk Interventions Readmission Risk Prevention Plan 01/27/2020  Post Dischage Appt Not Complete  Appt Comments DC to SNF  Medication Screening Complete  Transportation Screening Complete  Some recent data might be hidden   Reinaldo Raddle, RN, BSN  Trauma/Neuro ICU Case Manager [phone]

## 2020-01-27 NOTE — Progress Notes (Signed)
Discharge paperwork given to PTAR for receiving facility. Pt not in distress and tolerated well.

## 2020-01-28 DIAGNOSIS — I5032 Chronic diastolic (congestive) heart failure: Secondary | ICD-10-CM | POA: Diagnosis not present

## 2020-01-28 DIAGNOSIS — I10 Essential (primary) hypertension: Secondary | ICD-10-CM | POA: Diagnosis not present

## 2020-01-28 DIAGNOSIS — R6889 Other general symptoms and signs: Secondary | ICD-10-CM | POA: Diagnosis not present

## 2020-01-28 DIAGNOSIS — R69 Illness, unspecified: Secondary | ICD-10-CM | POA: Diagnosis not present

## 2020-01-28 DIAGNOSIS — R413 Other amnesia: Secondary | ICD-10-CM | POA: Diagnosis not present

## 2020-01-28 DIAGNOSIS — S42001D Fracture of unspecified part of right clavicle, subsequent encounter for fracture with routine healing: Secondary | ICD-10-CM | POA: Diagnosis not present

## 2020-01-28 DIAGNOSIS — K5010 Crohn's disease of large intestine without complications: Secondary | ICD-10-CM | POA: Diagnosis not present

## 2020-01-28 DIAGNOSIS — S2241XD Multiple fractures of ribs, right side, subsequent encounter for fracture with routine healing: Secondary | ICD-10-CM | POA: Diagnosis not present

## 2020-02-02 DIAGNOSIS — S42001D Fracture of unspecified part of right clavicle, subsequent encounter for fracture with routine healing: Secondary | ICD-10-CM | POA: Diagnosis not present

## 2020-02-02 DIAGNOSIS — S2241XD Multiple fractures of ribs, right side, subsequent encounter for fracture with routine healing: Secondary | ICD-10-CM | POA: Diagnosis not present

## 2020-02-10 DIAGNOSIS — M810 Age-related osteoporosis without current pathological fracture: Secondary | ICD-10-CM | POA: Diagnosis not present

## 2020-02-10 DIAGNOSIS — R69 Illness, unspecified: Secondary | ICD-10-CM | POA: Diagnosis not present

## 2020-02-10 DIAGNOSIS — K5010 Crohn's disease of large intestine without complications: Secondary | ICD-10-CM | POA: Diagnosis not present

## 2020-02-10 DIAGNOSIS — R2681 Unsteadiness on feet: Secondary | ICD-10-CM | POA: Diagnosis not present

## 2020-02-10 DIAGNOSIS — I5032 Chronic diastolic (congestive) heart failure: Secondary | ICD-10-CM | POA: Diagnosis not present

## 2020-02-10 DIAGNOSIS — K5090 Crohn's disease, unspecified, without complications: Secondary | ICD-10-CM | POA: Diagnosis not present

## 2020-02-10 DIAGNOSIS — K5901 Slow transit constipation: Secondary | ICD-10-CM | POA: Diagnosis not present

## 2020-02-10 DIAGNOSIS — Z4789 Encounter for other orthopedic aftercare: Secondary | ICD-10-CM | POA: Diagnosis not present

## 2020-02-10 DIAGNOSIS — R05 Cough: Secondary | ICD-10-CM | POA: Diagnosis not present

## 2020-02-10 DIAGNOSIS — I10 Essential (primary) hypertension: Secondary | ICD-10-CM | POA: Diagnosis not present

## 2020-02-10 DIAGNOSIS — W19XXXA Unspecified fall, initial encounter: Secondary | ICD-10-CM | POA: Diagnosis not present

## 2020-02-10 DIAGNOSIS — R278 Other lack of coordination: Secondary | ICD-10-CM | POA: Diagnosis not present

## 2020-02-10 DIAGNOSIS — U071 COVID-19: Secondary | ICD-10-CM | POA: Diagnosis not present

## 2020-02-10 DIAGNOSIS — M6281 Muscle weakness (generalized): Secondary | ICD-10-CM | POA: Diagnosis not present

## 2020-02-10 DIAGNOSIS — S2241XD Multiple fractures of ribs, right side, subsequent encounter for fracture with routine healing: Secondary | ICD-10-CM | POA: Diagnosis not present

## 2020-02-10 DIAGNOSIS — R2689 Other abnormalities of gait and mobility: Secondary | ICD-10-CM | POA: Diagnosis not present

## 2020-02-10 DIAGNOSIS — R627 Adult failure to thrive: Secondary | ICD-10-CM | POA: Diagnosis not present

## 2020-02-10 DIAGNOSIS — E871 Hypo-osmolality and hyponatremia: Secondary | ICD-10-CM | POA: Diagnosis not present

## 2020-02-10 DIAGNOSIS — R296 Repeated falls: Secondary | ICD-10-CM | POA: Diagnosis not present

## 2020-02-12 DIAGNOSIS — M810 Age-related osteoporosis without current pathological fracture: Secondary | ICD-10-CM | POA: Diagnosis not present

## 2020-02-12 DIAGNOSIS — I10 Essential (primary) hypertension: Secondary | ICD-10-CM | POA: Diagnosis not present

## 2020-02-12 DIAGNOSIS — K5090 Crohn's disease, unspecified, without complications: Secondary | ICD-10-CM | POA: Diagnosis not present

## 2020-02-12 DIAGNOSIS — K5901 Slow transit constipation: Secondary | ICD-10-CM | POA: Diagnosis not present

## 2020-02-13 DIAGNOSIS — M810 Age-related osteoporosis without current pathological fracture: Secondary | ICD-10-CM | POA: Diagnosis not present

## 2020-02-13 DIAGNOSIS — K5010 Crohn's disease of large intestine without complications: Secondary | ICD-10-CM | POA: Diagnosis not present

## 2020-02-13 DIAGNOSIS — K5901 Slow transit constipation: Secondary | ICD-10-CM | POA: Diagnosis not present

## 2020-02-13 DIAGNOSIS — W19XXXA Unspecified fall, initial encounter: Secondary | ICD-10-CM | POA: Diagnosis not present

## 2020-02-15 DIAGNOSIS — I10 Essential (primary) hypertension: Secondary | ICD-10-CM | POA: Diagnosis not present

## 2020-02-15 DIAGNOSIS — K5090 Crohn's disease, unspecified, without complications: Secondary | ICD-10-CM | POA: Diagnosis not present

## 2020-02-15 DIAGNOSIS — I5032 Chronic diastolic (congestive) heart failure: Secondary | ICD-10-CM | POA: Diagnosis not present

## 2020-02-15 DIAGNOSIS — Z4789 Encounter for other orthopedic aftercare: Secondary | ICD-10-CM | POA: Diagnosis not present

## 2020-02-16 DIAGNOSIS — I10 Essential (primary) hypertension: Secondary | ICD-10-CM | POA: Diagnosis not present

## 2020-02-16 DIAGNOSIS — U071 COVID-19: Secondary | ICD-10-CM | POA: Diagnosis not present

## 2020-02-16 DIAGNOSIS — I5032 Chronic diastolic (congestive) heart failure: Secondary | ICD-10-CM | POA: Diagnosis not present

## 2020-02-16 DIAGNOSIS — R05 Cough: Secondary | ICD-10-CM | POA: Diagnosis not present

## 2020-02-16 DIAGNOSIS — R627 Adult failure to thrive: Secondary | ICD-10-CM | POA: Diagnosis not present

## 2020-02-17 DIAGNOSIS — R69 Illness, unspecified: Secondary | ICD-10-CM | POA: Diagnosis not present

## 2020-02-17 DIAGNOSIS — K5090 Crohn's disease, unspecified, without complications: Secondary | ICD-10-CM | POA: Diagnosis not present

## 2020-02-17 DIAGNOSIS — E871 Hypo-osmolality and hyponatremia: Secondary | ICD-10-CM | POA: Diagnosis not present

## 2020-02-17 DIAGNOSIS — R05 Cough: Secondary | ICD-10-CM | POA: Diagnosis not present

## 2020-02-22 DIAGNOSIS — R413 Other amnesia: Secondary | ICD-10-CM | POA: Diagnosis not present

## 2020-02-22 DIAGNOSIS — M6281 Muscle weakness (generalized): Secondary | ICD-10-CM | POA: Diagnosis not present

## 2020-02-22 DIAGNOSIS — I10 Essential (primary) hypertension: Secondary | ICD-10-CM | POA: Diagnosis not present

## 2020-02-22 DIAGNOSIS — S42101D Fracture of unspecified part of scapula, right shoulder, subsequent encounter for fracture with routine healing: Secondary | ICD-10-CM | POA: Diagnosis not present

## 2020-02-22 DIAGNOSIS — R531 Weakness: Secondary | ICD-10-CM | POA: Diagnosis not present

## 2020-02-22 DIAGNOSIS — R2689 Other abnormalities of gait and mobility: Secondary | ICD-10-CM | POA: Diagnosis not present

## 2020-02-22 DIAGNOSIS — R6889 Other general symptoms and signs: Secondary | ICD-10-CM | POA: Diagnosis not present

## 2020-02-22 DIAGNOSIS — R41841 Cognitive communication deficit: Secondary | ICD-10-CM | POA: Diagnosis not present

## 2020-02-22 DIAGNOSIS — R2681 Unsteadiness on feet: Secondary | ICD-10-CM | POA: Diagnosis not present

## 2020-02-22 DIAGNOSIS — I5032 Chronic diastolic (congestive) heart failure: Secondary | ICD-10-CM | POA: Diagnosis not present

## 2020-02-22 DIAGNOSIS — K5010 Crohn's disease of large intestine without complications: Secondary | ICD-10-CM | POA: Diagnosis not present

## 2020-02-22 DIAGNOSIS — E440 Moderate protein-calorie malnutrition: Secondary | ICD-10-CM | POA: Diagnosis not present

## 2020-02-22 DIAGNOSIS — R278 Other lack of coordination: Secondary | ICD-10-CM | POA: Diagnosis not present

## 2020-02-22 DIAGNOSIS — S2241XD Multiple fractures of ribs, right side, subsequent encounter for fracture with routine healing: Secondary | ICD-10-CM | POA: Diagnosis not present

## 2020-02-22 DIAGNOSIS — S42001D Fracture of unspecified part of right clavicle, subsequent encounter for fracture with routine healing: Secondary | ICD-10-CM | POA: Diagnosis not present

## 2020-02-22 DIAGNOSIS — N390 Urinary tract infection, site not specified: Secondary | ICD-10-CM | POA: Diagnosis not present

## 2020-02-22 DIAGNOSIS — U071 COVID-19: Secondary | ICD-10-CM | POA: Diagnosis not present

## 2020-02-22 DIAGNOSIS — R633 Feeding difficulties, unspecified: Secondary | ICD-10-CM | POA: Diagnosis not present

## 2020-03-02 DIAGNOSIS — R413 Other amnesia: Secondary | ICD-10-CM | POA: Diagnosis not present

## 2020-03-02 DIAGNOSIS — E440 Moderate protein-calorie malnutrition: Secondary | ICD-10-CM | POA: Diagnosis not present

## 2020-03-02 DIAGNOSIS — I5032 Chronic diastolic (congestive) heart failure: Secondary | ICD-10-CM | POA: Diagnosis not present

## 2020-03-02 DIAGNOSIS — I10 Essential (primary) hypertension: Secondary | ICD-10-CM | POA: Diagnosis not present

## 2020-03-02 DIAGNOSIS — N390 Urinary tract infection, site not specified: Secondary | ICD-10-CM | POA: Diagnosis not present

## 2020-03-02 DIAGNOSIS — K5010 Crohn's disease of large intestine without complications: Secondary | ICD-10-CM | POA: Diagnosis not present

## 2020-03-02 DIAGNOSIS — R531 Weakness: Secondary | ICD-10-CM | POA: Diagnosis not present

## 2020-03-02 DIAGNOSIS — R6889 Other general symptoms and signs: Secondary | ICD-10-CM | POA: Diagnosis not present

## 2020-03-10 DIAGNOSIS — R41841 Cognitive communication deficit: Secondary | ICD-10-CM | POA: Diagnosis not present

## 2020-03-10 DIAGNOSIS — S42001D Fracture of unspecified part of right clavicle, subsequent encounter for fracture with routine healing: Secondary | ICD-10-CM | POA: Diagnosis not present

## 2020-03-10 DIAGNOSIS — M6281 Muscle weakness (generalized): Secondary | ICD-10-CM | POA: Diagnosis not present

## 2020-03-10 DIAGNOSIS — I10 Essential (primary) hypertension: Secondary | ICD-10-CM | POA: Diagnosis not present

## 2020-03-10 DIAGNOSIS — U071 COVID-19: Secondary | ICD-10-CM | POA: Diagnosis not present

## 2020-03-10 DIAGNOSIS — S42101D Fracture of unspecified part of scapula, right shoulder, subsequent encounter for fracture with routine healing: Secondary | ICD-10-CM | POA: Diagnosis not present

## 2020-03-10 DIAGNOSIS — I5032 Chronic diastolic (congestive) heart failure: Secondary | ICD-10-CM | POA: Diagnosis not present

## 2020-03-10 DIAGNOSIS — S2241XD Multiple fractures of ribs, right side, subsequent encounter for fracture with routine healing: Secondary | ICD-10-CM | POA: Diagnosis not present

## 2020-03-10 DIAGNOSIS — R2689 Other abnormalities of gait and mobility: Secondary | ICD-10-CM | POA: Diagnosis not present

## 2020-03-10 DIAGNOSIS — R633 Feeding difficulties, unspecified: Secondary | ICD-10-CM | POA: Diagnosis not present

## 2020-03-10 DIAGNOSIS — K5010 Crohn's disease of large intestine without complications: Secondary | ICD-10-CM | POA: Diagnosis not present

## 2020-03-10 DIAGNOSIS — R278 Other lack of coordination: Secondary | ICD-10-CM | POA: Diagnosis not present

## 2020-03-10 DIAGNOSIS — R2681 Unsteadiness on feet: Secondary | ICD-10-CM | POA: Diagnosis not present

## 2020-03-14 DIAGNOSIS — S42001D Fracture of unspecified part of right clavicle, subsequent encounter for fracture with routine healing: Secondary | ICD-10-CM | POA: Diagnosis not present

## 2020-03-14 DIAGNOSIS — S2241XD Multiple fractures of ribs, right side, subsequent encounter for fracture with routine healing: Secondary | ICD-10-CM | POA: Diagnosis not present

## 2020-03-14 DIAGNOSIS — K5010 Crohn's disease of large intestine without complications: Secondary | ICD-10-CM | POA: Diagnosis not present

## 2020-03-17 DIAGNOSIS — U071 COVID-19: Secondary | ICD-10-CM | POA: Diagnosis not present

## 2020-03-17 DIAGNOSIS — S2241XD Multiple fractures of ribs, right side, subsequent encounter for fracture with routine healing: Secondary | ICD-10-CM | POA: Diagnosis not present

## 2020-03-17 DIAGNOSIS — R41841 Cognitive communication deficit: Secondary | ICD-10-CM | POA: Diagnosis not present

## 2020-03-17 DIAGNOSIS — S42101D Fracture of unspecified part of scapula, right shoulder, subsequent encounter for fracture with routine healing: Secondary | ICD-10-CM | POA: Diagnosis not present

## 2020-03-17 DIAGNOSIS — M6281 Muscle weakness (generalized): Secondary | ICD-10-CM | POA: Diagnosis not present

## 2020-03-17 DIAGNOSIS — R633 Feeding difficulties, unspecified: Secondary | ICD-10-CM | POA: Diagnosis not present

## 2020-03-17 DIAGNOSIS — R2689 Other abnormalities of gait and mobility: Secondary | ICD-10-CM | POA: Diagnosis not present

## 2020-03-17 DIAGNOSIS — R278 Other lack of coordination: Secondary | ICD-10-CM | POA: Diagnosis not present

## 2020-03-17 DIAGNOSIS — R2681 Unsteadiness on feet: Secondary | ICD-10-CM | POA: Diagnosis not present

## 2020-03-17 DIAGNOSIS — I5032 Chronic diastolic (congestive) heart failure: Secondary | ICD-10-CM | POA: Diagnosis not present

## 2020-03-27 DIAGNOSIS — R2681 Unsteadiness on feet: Secondary | ICD-10-CM | POA: Diagnosis not present

## 2020-03-27 DIAGNOSIS — I10 Essential (primary) hypertension: Secondary | ICD-10-CM | POA: Diagnosis not present

## 2020-03-27 DIAGNOSIS — M6281 Muscle weakness (generalized): Secondary | ICD-10-CM | POA: Diagnosis not present

## 2020-04-10 DIAGNOSIS — S42001D Fracture of unspecified part of right clavicle, subsequent encounter for fracture with routine healing: Secondary | ICD-10-CM | POA: Diagnosis not present

## 2020-04-10 DIAGNOSIS — K5010 Crohn's disease of large intestine without complications: Secondary | ICD-10-CM | POA: Diagnosis not present

## 2020-04-10 DIAGNOSIS — I5032 Chronic diastolic (congestive) heart failure: Secondary | ICD-10-CM | POA: Diagnosis not present

## 2020-04-10 DIAGNOSIS — R531 Weakness: Secondary | ICD-10-CM | POA: Diagnosis not present

## 2020-04-10 DIAGNOSIS — R69 Illness, unspecified: Secondary | ICD-10-CM | POA: Diagnosis not present

## 2020-04-10 DIAGNOSIS — I10 Essential (primary) hypertension: Secondary | ICD-10-CM | POA: Diagnosis not present

## 2020-04-10 DIAGNOSIS — S2241XD Multiple fractures of ribs, right side, subsequent encounter for fracture with routine healing: Secondary | ICD-10-CM | POA: Diagnosis not present

## 2020-04-14 DIAGNOSIS — R2681 Unsteadiness on feet: Secondary | ICD-10-CM | POA: Diagnosis not present

## 2020-04-14 DIAGNOSIS — I10 Essential (primary) hypertension: Secondary | ICD-10-CM | POA: Diagnosis not present

## 2020-04-14 DIAGNOSIS — M6281 Muscle weakness (generalized): Secondary | ICD-10-CM | POA: Diagnosis not present

## 2020-04-17 DIAGNOSIS — M6281 Muscle weakness (generalized): Secondary | ICD-10-CM | POA: Diagnosis not present

## 2020-04-17 DIAGNOSIS — R2681 Unsteadiness on feet: Secondary | ICD-10-CM | POA: Diagnosis not present

## 2020-04-17 DIAGNOSIS — I10 Essential (primary) hypertension: Secondary | ICD-10-CM | POA: Diagnosis not present

## 2020-04-18 DIAGNOSIS — R2681 Unsteadiness on feet: Secondary | ICD-10-CM | POA: Diagnosis not present

## 2020-04-18 DIAGNOSIS — I10 Essential (primary) hypertension: Secondary | ICD-10-CM | POA: Diagnosis not present

## 2020-04-18 DIAGNOSIS — M6281 Muscle weakness (generalized): Secondary | ICD-10-CM | POA: Diagnosis not present

## 2020-04-19 DIAGNOSIS — I10 Essential (primary) hypertension: Secondary | ICD-10-CM | POA: Diagnosis not present

## 2020-04-19 DIAGNOSIS — R2681 Unsteadiness on feet: Secondary | ICD-10-CM | POA: Diagnosis not present

## 2020-04-19 DIAGNOSIS — M6281 Muscle weakness (generalized): Secondary | ICD-10-CM | POA: Diagnosis not present

## 2020-04-20 DIAGNOSIS — R2681 Unsteadiness on feet: Secondary | ICD-10-CM | POA: Diagnosis not present

## 2020-04-20 DIAGNOSIS — I10 Essential (primary) hypertension: Secondary | ICD-10-CM | POA: Diagnosis not present

## 2020-04-20 DIAGNOSIS — M6281 Muscle weakness (generalized): Secondary | ICD-10-CM | POA: Diagnosis not present

## 2020-04-22 DIAGNOSIS — I10 Essential (primary) hypertension: Secondary | ICD-10-CM | POA: Diagnosis not present

## 2020-04-22 DIAGNOSIS — M6281 Muscle weakness (generalized): Secondary | ICD-10-CM | POA: Diagnosis not present

## 2020-04-22 DIAGNOSIS — R2681 Unsteadiness on feet: Secondary | ICD-10-CM | POA: Diagnosis not present

## 2020-04-24 DIAGNOSIS — I10 Essential (primary) hypertension: Secondary | ICD-10-CM | POA: Diagnosis not present

## 2020-04-24 DIAGNOSIS — M6281 Muscle weakness (generalized): Secondary | ICD-10-CM | POA: Diagnosis not present

## 2020-04-24 DIAGNOSIS — R2681 Unsteadiness on feet: Secondary | ICD-10-CM | POA: Diagnosis not present

## 2020-04-25 DIAGNOSIS — I10 Essential (primary) hypertension: Secondary | ICD-10-CM | POA: Diagnosis not present

## 2020-04-25 DIAGNOSIS — R2681 Unsteadiness on feet: Secondary | ICD-10-CM | POA: Diagnosis not present

## 2020-04-25 DIAGNOSIS — E118 Type 2 diabetes mellitus with unspecified complications: Secondary | ICD-10-CM | POA: Diagnosis not present

## 2020-04-25 DIAGNOSIS — I5032 Chronic diastolic (congestive) heart failure: Secondary | ICD-10-CM | POA: Diagnosis not present

## 2020-04-25 DIAGNOSIS — M6281 Muscle weakness (generalized): Secondary | ICD-10-CM | POA: Diagnosis not present

## 2020-04-26 DIAGNOSIS — I10 Essential (primary) hypertension: Secondary | ICD-10-CM | POA: Diagnosis not present

## 2020-04-26 DIAGNOSIS — N390 Urinary tract infection, site not specified: Secondary | ICD-10-CM | POA: Diagnosis not present

## 2020-04-26 DIAGNOSIS — R2681 Unsteadiness on feet: Secondary | ICD-10-CM | POA: Diagnosis not present

## 2020-04-26 DIAGNOSIS — M6281 Muscle weakness (generalized): Secondary | ICD-10-CM | POA: Diagnosis not present

## 2020-04-26 DIAGNOSIS — E119 Type 2 diabetes mellitus without complications: Secondary | ICD-10-CM | POA: Diagnosis not present

## 2020-04-26 DIAGNOSIS — I5032 Chronic diastolic (congestive) heart failure: Secondary | ICD-10-CM | POA: Diagnosis not present

## 2020-04-27 DIAGNOSIS — R2681 Unsteadiness on feet: Secondary | ICD-10-CM | POA: Diagnosis not present

## 2020-04-27 DIAGNOSIS — I10 Essential (primary) hypertension: Secondary | ICD-10-CM | POA: Diagnosis not present

## 2020-04-27 DIAGNOSIS — M6281 Muscle weakness (generalized): Secondary | ICD-10-CM | POA: Diagnosis not present

## 2020-04-28 DIAGNOSIS — S42001D Fracture of unspecified part of right clavicle, subsequent encounter for fracture with routine healing: Secondary | ICD-10-CM | POA: Diagnosis not present

## 2020-04-28 DIAGNOSIS — R5381 Other malaise: Secondary | ICD-10-CM | POA: Diagnosis not present

## 2020-04-28 DIAGNOSIS — M6281 Muscle weakness (generalized): Secondary | ICD-10-CM | POA: Diagnosis not present

## 2020-04-28 DIAGNOSIS — K5010 Crohn's disease of large intestine without complications: Secondary | ICD-10-CM | POA: Diagnosis not present

## 2020-04-28 DIAGNOSIS — R69 Illness, unspecified: Secondary | ICD-10-CM | POA: Diagnosis not present

## 2020-04-28 DIAGNOSIS — R2681 Unsteadiness on feet: Secondary | ICD-10-CM | POA: Diagnosis not present

## 2020-04-28 DIAGNOSIS — N390 Urinary tract infection, site not specified: Secondary | ICD-10-CM | POA: Diagnosis not present

## 2020-04-28 DIAGNOSIS — I5032 Chronic diastolic (congestive) heart failure: Secondary | ICD-10-CM | POA: Diagnosis not present

## 2020-04-28 DIAGNOSIS — I10 Essential (primary) hypertension: Secondary | ICD-10-CM | POA: Diagnosis not present

## 2020-04-30 DIAGNOSIS — M6281 Muscle weakness (generalized): Secondary | ICD-10-CM | POA: Diagnosis not present

## 2020-04-30 DIAGNOSIS — R2681 Unsteadiness on feet: Secondary | ICD-10-CM | POA: Diagnosis not present

## 2020-04-30 DIAGNOSIS — I10 Essential (primary) hypertension: Secondary | ICD-10-CM | POA: Diagnosis not present

## 2020-05-01 DIAGNOSIS — I10 Essential (primary) hypertension: Secondary | ICD-10-CM | POA: Diagnosis not present

## 2020-05-01 DIAGNOSIS — R2681 Unsteadiness on feet: Secondary | ICD-10-CM | POA: Diagnosis not present

## 2020-05-01 DIAGNOSIS — M6281 Muscle weakness (generalized): Secondary | ICD-10-CM | POA: Diagnosis not present

## 2020-05-02 DIAGNOSIS — I10 Essential (primary) hypertension: Secondary | ICD-10-CM | POA: Diagnosis not present

## 2020-05-02 DIAGNOSIS — R2681 Unsteadiness on feet: Secondary | ICD-10-CM | POA: Diagnosis not present

## 2020-05-02 DIAGNOSIS — M6281 Muscle weakness (generalized): Secondary | ICD-10-CM | POA: Diagnosis not present

## 2020-05-03 DIAGNOSIS — I10 Essential (primary) hypertension: Secondary | ICD-10-CM | POA: Diagnosis not present

## 2020-05-03 DIAGNOSIS — M6281 Muscle weakness (generalized): Secondary | ICD-10-CM | POA: Diagnosis not present

## 2020-05-03 DIAGNOSIS — R2681 Unsteadiness on feet: Secondary | ICD-10-CM | POA: Diagnosis not present

## 2020-05-08 DIAGNOSIS — G301 Alzheimer's disease with late onset: Secondary | ICD-10-CM | POA: Diagnosis not present

## 2020-05-08 DIAGNOSIS — I10 Essential (primary) hypertension: Secondary | ICD-10-CM | POA: Diagnosis not present

## 2020-05-08 DIAGNOSIS — R69 Illness, unspecified: Secondary | ICD-10-CM | POA: Diagnosis not present

## 2020-05-09 DIAGNOSIS — R2681 Unsteadiness on feet: Secondary | ICD-10-CM | POA: Diagnosis not present

## 2020-05-09 DIAGNOSIS — M810 Age-related osteoporosis without current pathological fracture: Secondary | ICD-10-CM | POA: Diagnosis not present

## 2020-05-09 DIAGNOSIS — R69 Illness, unspecified: Secondary | ICD-10-CM | POA: Diagnosis not present

## 2020-05-09 DIAGNOSIS — I10 Essential (primary) hypertension: Secondary | ICD-10-CM | POA: Diagnosis not present

## 2020-05-09 DIAGNOSIS — M6281 Muscle weakness (generalized): Secondary | ICD-10-CM | POA: Diagnosis not present

## 2020-05-09 DIAGNOSIS — R52 Pain, unspecified: Secondary | ICD-10-CM | POA: Diagnosis not present

## 2020-05-10 DIAGNOSIS — R2681 Unsteadiness on feet: Secondary | ICD-10-CM | POA: Diagnosis not present

## 2020-05-10 DIAGNOSIS — I10 Essential (primary) hypertension: Secondary | ICD-10-CM | POA: Diagnosis not present

## 2020-05-10 DIAGNOSIS — M6281 Muscle weakness (generalized): Secondary | ICD-10-CM | POA: Diagnosis not present

## 2020-05-11 DIAGNOSIS — M6281 Muscle weakness (generalized): Secondary | ICD-10-CM | POA: Diagnosis not present

## 2020-05-11 DIAGNOSIS — I10 Essential (primary) hypertension: Secondary | ICD-10-CM | POA: Diagnosis not present

## 2020-05-11 DIAGNOSIS — R2681 Unsteadiness on feet: Secondary | ICD-10-CM | POA: Diagnosis not present

## 2020-05-12 DIAGNOSIS — M6281 Muscle weakness (generalized): Secondary | ICD-10-CM | POA: Diagnosis not present

## 2020-05-12 DIAGNOSIS — I10 Essential (primary) hypertension: Secondary | ICD-10-CM | POA: Diagnosis not present

## 2020-05-12 DIAGNOSIS — R2681 Unsteadiness on feet: Secondary | ICD-10-CM | POA: Diagnosis not present

## 2020-05-16 DIAGNOSIS — R2681 Unsteadiness on feet: Secondary | ICD-10-CM | POA: Diagnosis not present

## 2020-05-16 DIAGNOSIS — I10 Essential (primary) hypertension: Secondary | ICD-10-CM | POA: Diagnosis not present

## 2020-05-16 DIAGNOSIS — M6281 Muscle weakness (generalized): Secondary | ICD-10-CM | POA: Diagnosis not present

## 2020-05-18 DIAGNOSIS — I10 Essential (primary) hypertension: Secondary | ICD-10-CM | POA: Diagnosis not present

## 2020-05-18 DIAGNOSIS — R2681 Unsteadiness on feet: Secondary | ICD-10-CM | POA: Diagnosis not present

## 2020-05-18 DIAGNOSIS — M6281 Muscle weakness (generalized): Secondary | ICD-10-CM | POA: Diagnosis not present

## 2020-05-19 DIAGNOSIS — R2681 Unsteadiness on feet: Secondary | ICD-10-CM | POA: Diagnosis not present

## 2020-05-19 DIAGNOSIS — I10 Essential (primary) hypertension: Secondary | ICD-10-CM | POA: Diagnosis not present

## 2020-05-19 DIAGNOSIS — M6281 Muscle weakness (generalized): Secondary | ICD-10-CM | POA: Diagnosis not present

## 2020-05-23 DIAGNOSIS — R2681 Unsteadiness on feet: Secondary | ICD-10-CM | POA: Diagnosis not present

## 2020-05-23 DIAGNOSIS — I10 Essential (primary) hypertension: Secondary | ICD-10-CM | POA: Diagnosis not present

## 2020-05-23 DIAGNOSIS — M6281 Muscle weakness (generalized): Secondary | ICD-10-CM | POA: Diagnosis not present

## 2020-05-24 DIAGNOSIS — R2681 Unsteadiness on feet: Secondary | ICD-10-CM | POA: Diagnosis not present

## 2020-05-24 DIAGNOSIS — I10 Essential (primary) hypertension: Secondary | ICD-10-CM | POA: Diagnosis not present

## 2020-05-24 DIAGNOSIS — M6281 Muscle weakness (generalized): Secondary | ICD-10-CM | POA: Diagnosis not present

## 2020-05-25 DIAGNOSIS — R2681 Unsteadiness on feet: Secondary | ICD-10-CM | POA: Diagnosis not present

## 2020-05-25 DIAGNOSIS — I10 Essential (primary) hypertension: Secondary | ICD-10-CM | POA: Diagnosis not present

## 2020-05-25 DIAGNOSIS — M6281 Muscle weakness (generalized): Secondary | ICD-10-CM | POA: Diagnosis not present

## 2020-05-26 DIAGNOSIS — M6281 Muscle weakness (generalized): Secondary | ICD-10-CM | POA: Diagnosis not present

## 2020-05-26 DIAGNOSIS — R2681 Unsteadiness on feet: Secondary | ICD-10-CM | POA: Diagnosis not present

## 2020-05-26 DIAGNOSIS — I10 Essential (primary) hypertension: Secondary | ICD-10-CM | POA: Diagnosis not present

## 2020-05-31 DIAGNOSIS — I10 Essential (primary) hypertension: Secondary | ICD-10-CM | POA: Diagnosis not present

## 2020-05-31 DIAGNOSIS — M6281 Muscle weakness (generalized): Secondary | ICD-10-CM | POA: Diagnosis not present

## 2020-05-31 DIAGNOSIS — R2681 Unsteadiness on feet: Secondary | ICD-10-CM | POA: Diagnosis not present

## 2020-06-01 DIAGNOSIS — M6281 Muscle weakness (generalized): Secondary | ICD-10-CM | POA: Diagnosis not present

## 2020-06-01 DIAGNOSIS — I10 Essential (primary) hypertension: Secondary | ICD-10-CM | POA: Diagnosis not present

## 2020-06-01 DIAGNOSIS — R2681 Unsteadiness on feet: Secondary | ICD-10-CM | POA: Diagnosis not present

## 2020-06-05 DIAGNOSIS — I10 Essential (primary) hypertension: Secondary | ICD-10-CM | POA: Diagnosis not present

## 2020-06-05 DIAGNOSIS — R2681 Unsteadiness on feet: Secondary | ICD-10-CM | POA: Diagnosis not present

## 2020-06-05 DIAGNOSIS — M6281 Muscle weakness (generalized): Secondary | ICD-10-CM | POA: Diagnosis not present

## 2020-06-07 DIAGNOSIS — I10 Essential (primary) hypertension: Secondary | ICD-10-CM | POA: Diagnosis not present

## 2020-06-07 DIAGNOSIS — R2681 Unsteadiness on feet: Secondary | ICD-10-CM | POA: Diagnosis not present

## 2020-06-07 DIAGNOSIS — M6281 Muscle weakness (generalized): Secondary | ICD-10-CM | POA: Diagnosis not present

## 2020-06-08 DIAGNOSIS — G301 Alzheimer's disease with late onset: Secondary | ICD-10-CM | POA: Diagnosis not present

## 2020-06-08 DIAGNOSIS — R69 Illness, unspecified: Secondary | ICD-10-CM | POA: Diagnosis not present

## 2020-06-08 DIAGNOSIS — I10 Essential (primary) hypertension: Secondary | ICD-10-CM | POA: Diagnosis not present

## 2020-06-21 DIAGNOSIS — I158 Other secondary hypertension: Secondary | ICD-10-CM | POA: Diagnosis not present

## 2020-06-22 DIAGNOSIS — I10 Essential (primary) hypertension: Secondary | ICD-10-CM | POA: Diagnosis not present

## 2020-06-22 DIAGNOSIS — G301 Alzheimer's disease with late onset: Secondary | ICD-10-CM | POA: Diagnosis not present

## 2020-06-22 DIAGNOSIS — R69 Illness, unspecified: Secondary | ICD-10-CM | POA: Diagnosis not present

## 2020-07-07 DIAGNOSIS — Z4689 Encounter for fitting and adjustment of other specified devices: Secondary | ICD-10-CM | POA: Diagnosis not present

## 2020-07-07 DIAGNOSIS — N3281 Overactive bladder: Secondary | ICD-10-CM | POA: Diagnosis not present

## 2020-07-07 DIAGNOSIS — N952 Postmenopausal atrophic vaginitis: Secondary | ICD-10-CM | POA: Diagnosis not present

## 2020-07-07 DIAGNOSIS — R69 Illness, unspecified: Secondary | ICD-10-CM | POA: Diagnosis not present

## 2020-07-07 DIAGNOSIS — N813 Complete uterovaginal prolapse: Secondary | ICD-10-CM | POA: Diagnosis not present

## 2020-07-07 DIAGNOSIS — R8290 Unspecified abnormal findings in urine: Secondary | ICD-10-CM | POA: Diagnosis not present

## 2020-07-13 DIAGNOSIS — Z20828 Contact with and (suspected) exposure to other viral communicable diseases: Secondary | ICD-10-CM | POA: Diagnosis not present

## 2020-07-17 DIAGNOSIS — R69 Illness, unspecified: Secondary | ICD-10-CM | POA: Diagnosis not present

## 2020-07-17 DIAGNOSIS — Z1389 Encounter for screening for other disorder: Secondary | ICD-10-CM | POA: Diagnosis not present

## 2020-07-17 DIAGNOSIS — Z0000 Encounter for general adult medical examination without abnormal findings: Secondary | ICD-10-CM | POA: Diagnosis not present

## 2020-07-17 DIAGNOSIS — I10 Essential (primary) hypertension: Secondary | ICD-10-CM | POA: Diagnosis not present

## 2020-07-17 DIAGNOSIS — K50118 Crohn's disease of large intestine with other complication: Secondary | ICD-10-CM | POA: Diagnosis not present

## 2020-07-17 DIAGNOSIS — Z79899 Other long term (current) drug therapy: Secondary | ICD-10-CM | POA: Diagnosis not present

## 2020-07-17 DIAGNOSIS — F0390 Unspecified dementia without behavioral disturbance: Secondary | ICD-10-CM | POA: Diagnosis not present

## 2020-07-17 DIAGNOSIS — G301 Alzheimer's disease with late onset: Secondary | ICD-10-CM | POA: Diagnosis not present

## 2020-07-20 DIAGNOSIS — Z20828 Contact with and (suspected) exposure to other viral communicable diseases: Secondary | ICD-10-CM | POA: Diagnosis not present

## 2020-07-24 DIAGNOSIS — Z23 Encounter for immunization: Secondary | ICD-10-CM | POA: Diagnosis not present

## 2020-07-24 DIAGNOSIS — I10 Essential (primary) hypertension: Secondary | ICD-10-CM | POA: Diagnosis not present

## 2020-07-24 DIAGNOSIS — G301 Alzheimer's disease with late onset: Secondary | ICD-10-CM | POA: Diagnosis not present

## 2020-07-24 DIAGNOSIS — Z20828 Contact with and (suspected) exposure to other viral communicable diseases: Secondary | ICD-10-CM | POA: Diagnosis not present

## 2020-07-24 DIAGNOSIS — R69 Illness, unspecified: Secondary | ICD-10-CM | POA: Diagnosis not present

## 2020-07-25 DIAGNOSIS — M810 Age-related osteoporosis without current pathological fracture: Secondary | ICD-10-CM | POA: Diagnosis not present

## 2020-07-25 DIAGNOSIS — R69 Illness, unspecified: Secondary | ICD-10-CM | POA: Diagnosis not present

## 2020-07-25 DIAGNOSIS — I10 Essential (primary) hypertension: Secondary | ICD-10-CM | POA: Diagnosis not present

## 2020-07-31 DIAGNOSIS — Z20828 Contact with and (suspected) exposure to other viral communicable diseases: Secondary | ICD-10-CM | POA: Diagnosis not present

## 2020-08-07 DIAGNOSIS — Z20828 Contact with and (suspected) exposure to other viral communicable diseases: Secondary | ICD-10-CM | POA: Diagnosis not present

## 2020-08-15 DIAGNOSIS — Z20828 Contact with and (suspected) exposure to other viral communicable diseases: Secondary | ICD-10-CM | POA: Diagnosis not present

## 2020-08-21 DIAGNOSIS — Z20828 Contact with and (suspected) exposure to other viral communicable diseases: Secondary | ICD-10-CM | POA: Diagnosis not present

## 2020-08-29 DIAGNOSIS — R69 Illness, unspecified: Secondary | ICD-10-CM | POA: Diagnosis not present

## 2020-08-29 DIAGNOSIS — G301 Alzheimer's disease with late onset: Secondary | ICD-10-CM | POA: Diagnosis not present

## 2020-08-29 DIAGNOSIS — I10 Essential (primary) hypertension: Secondary | ICD-10-CM | POA: Diagnosis not present

## 2020-09-18 DIAGNOSIS — I10 Essential (primary) hypertension: Secondary | ICD-10-CM | POA: Diagnosis not present

## 2020-09-18 DIAGNOSIS — I5032 Chronic diastolic (congestive) heart failure: Secondary | ICD-10-CM | POA: Diagnosis not present

## 2020-09-18 DIAGNOSIS — R52 Pain, unspecified: Secondary | ICD-10-CM | POA: Diagnosis not present

## 2020-09-18 DIAGNOSIS — R69 Illness, unspecified: Secondary | ICD-10-CM | POA: Diagnosis not present

## 2020-10-02 DIAGNOSIS — M810 Age-related osteoporosis without current pathological fracture: Secondary | ICD-10-CM | POA: Diagnosis not present

## 2020-10-02 DIAGNOSIS — R69 Illness, unspecified: Secondary | ICD-10-CM | POA: Diagnosis not present

## 2020-10-02 DIAGNOSIS — I5032 Chronic diastolic (congestive) heart failure: Secondary | ICD-10-CM | POA: Diagnosis not present

## 2020-10-02 DIAGNOSIS — I10 Essential (primary) hypertension: Secondary | ICD-10-CM | POA: Diagnosis not present

## 2020-10-02 DIAGNOSIS — Z79899 Other long term (current) drug therapy: Secondary | ICD-10-CM | POA: Diagnosis not present

## 2020-10-05 DIAGNOSIS — N813 Complete uterovaginal prolapse: Secondary | ICD-10-CM | POA: Diagnosis not present

## 2020-10-05 DIAGNOSIS — N952 Postmenopausal atrophic vaginitis: Secondary | ICD-10-CM | POA: Diagnosis not present

## 2020-10-05 DIAGNOSIS — N3281 Overactive bladder: Secondary | ICD-10-CM | POA: Diagnosis not present

## 2020-10-05 DIAGNOSIS — Z4689 Encounter for fitting and adjustment of other specified devices: Secondary | ICD-10-CM | POA: Diagnosis not present

## 2020-11-02 DIAGNOSIS — I10 Essential (primary) hypertension: Secondary | ICD-10-CM | POA: Diagnosis not present

## 2020-11-02 DIAGNOSIS — G301 Alzheimer's disease with late onset: Secondary | ICD-10-CM | POA: Diagnosis not present

## 2020-11-02 DIAGNOSIS — R69 Illness, unspecified: Secondary | ICD-10-CM | POA: Diagnosis not present

## 2020-11-14 DIAGNOSIS — K5900 Constipation, unspecified: Secondary | ICD-10-CM | POA: Diagnosis not present

## 2020-11-14 DIAGNOSIS — L989 Disorder of the skin and subcutaneous tissue, unspecified: Secondary | ICD-10-CM | POA: Diagnosis not present

## 2020-11-14 DIAGNOSIS — R0602 Shortness of breath: Secondary | ICD-10-CM | POA: Diagnosis not present

## 2020-11-14 DIAGNOSIS — I700 Atherosclerosis of aorta: Secondary | ICD-10-CM | POA: Diagnosis not present

## 2020-11-14 DIAGNOSIS — I10 Essential (primary) hypertension: Secondary | ICD-10-CM | POA: Diagnosis not present

## 2020-11-14 DIAGNOSIS — G301 Alzheimer's disease with late onset: Secondary | ICD-10-CM | POA: Diagnosis not present

## 2020-11-14 DIAGNOSIS — M810 Age-related osteoporosis without current pathological fracture: Secondary | ICD-10-CM | POA: Diagnosis not present

## 2020-11-14 DIAGNOSIS — D692 Other nonthrombocytopenic purpura: Secondary | ICD-10-CM | POA: Diagnosis not present

## 2020-11-14 DIAGNOSIS — Z79899 Other long term (current) drug therapy: Secondary | ICD-10-CM | POA: Diagnosis not present

## 2020-11-14 DIAGNOSIS — K50118 Crohn's disease of large intestine with other complication: Secondary | ICD-10-CM | POA: Diagnosis not present

## 2020-11-14 DIAGNOSIS — R69 Illness, unspecified: Secondary | ICD-10-CM | POA: Diagnosis not present

## 2020-12-01 DIAGNOSIS — I10 Essential (primary) hypertension: Secondary | ICD-10-CM | POA: Diagnosis not present

## 2020-12-01 DIAGNOSIS — R69 Illness, unspecified: Secondary | ICD-10-CM | POA: Diagnosis not present

## 2020-12-01 DIAGNOSIS — G301 Alzheimer's disease with late onset: Secondary | ICD-10-CM | POA: Diagnosis not present

## 2020-12-13 DIAGNOSIS — C44329 Squamous cell carcinoma of skin of other parts of face: Secondary | ICD-10-CM | POA: Diagnosis not present

## 2021-01-08 DIAGNOSIS — I10 Essential (primary) hypertension: Secondary | ICD-10-CM | POA: Diagnosis not present

## 2021-01-08 DIAGNOSIS — I5032 Chronic diastolic (congestive) heart failure: Secondary | ICD-10-CM | POA: Diagnosis not present

## 2021-01-08 DIAGNOSIS — R5381 Other malaise: Secondary | ICD-10-CM | POA: Diagnosis not present

## 2021-01-08 DIAGNOSIS — R69 Illness, unspecified: Secondary | ICD-10-CM | POA: Diagnosis not present

## 2021-01-08 DIAGNOSIS — M810 Age-related osteoporosis without current pathological fracture: Secondary | ICD-10-CM | POA: Diagnosis not present

## 2021-01-08 DIAGNOSIS — K5010 Crohn's disease of large intestine without complications: Secondary | ICD-10-CM | POA: Diagnosis not present

## 2021-01-08 DIAGNOSIS — Z79899 Other long term (current) drug therapy: Secondary | ICD-10-CM | POA: Diagnosis not present

## 2021-01-10 DIAGNOSIS — I10 Essential (primary) hypertension: Secondary | ICD-10-CM | POA: Diagnosis not present

## 2021-01-11 DIAGNOSIS — I7091 Generalized atherosclerosis: Secondary | ICD-10-CM | POA: Diagnosis not present

## 2021-01-11 DIAGNOSIS — L603 Nail dystrophy: Secondary | ICD-10-CM | POA: Diagnosis not present

## 2021-01-17 DIAGNOSIS — H18413 Arcus senilis, bilateral: Secondary | ICD-10-CM | POA: Diagnosis not present

## 2021-01-17 DIAGNOSIS — H524 Presbyopia: Secondary | ICD-10-CM | POA: Diagnosis not present

## 2021-01-17 DIAGNOSIS — H2513 Age-related nuclear cataract, bilateral: Secondary | ICD-10-CM | POA: Diagnosis not present

## 2021-01-22 DIAGNOSIS — R69 Illness, unspecified: Secondary | ICD-10-CM | POA: Diagnosis not present

## 2021-01-25 DIAGNOSIS — N952 Postmenopausal atrophic vaginitis: Secondary | ICD-10-CM | POA: Diagnosis not present

## 2021-01-25 DIAGNOSIS — N3281 Overactive bladder: Secondary | ICD-10-CM | POA: Diagnosis not present

## 2021-01-25 DIAGNOSIS — G301 Alzheimer's disease with late onset: Secondary | ICD-10-CM | POA: Diagnosis not present

## 2021-01-25 DIAGNOSIS — N813 Complete uterovaginal prolapse: Secondary | ICD-10-CM | POA: Diagnosis not present

## 2021-01-25 DIAGNOSIS — R69 Illness, unspecified: Secondary | ICD-10-CM | POA: Diagnosis not present

## 2021-01-25 DIAGNOSIS — Z4689 Encounter for fitting and adjustment of other specified devices: Secondary | ICD-10-CM | POA: Diagnosis not present

## 2021-01-25 DIAGNOSIS — R8290 Unspecified abnormal findings in urine: Secondary | ICD-10-CM | POA: Diagnosis not present

## 2021-01-25 DIAGNOSIS — I10 Essential (primary) hypertension: Secondary | ICD-10-CM | POA: Diagnosis not present

## 2021-01-29 DIAGNOSIS — R58 Hemorrhage, not elsewhere classified: Secondary | ICD-10-CM | POA: Diagnosis not present

## 2021-02-01 DIAGNOSIS — N390 Urinary tract infection, site not specified: Secondary | ICD-10-CM | POA: Diagnosis not present

## 2021-02-01 DIAGNOSIS — R69 Illness, unspecified: Secondary | ICD-10-CM | POA: Diagnosis not present

## 2021-02-01 DIAGNOSIS — Z5181 Encounter for therapeutic drug level monitoring: Secondary | ICD-10-CM | POA: Diagnosis not present

## 2021-02-05 DIAGNOSIS — R69 Illness, unspecified: Secondary | ICD-10-CM | POA: Diagnosis not present

## 2021-02-16 DIAGNOSIS — R69 Illness, unspecified: Secondary | ICD-10-CM | POA: Diagnosis not present

## 2021-02-16 DIAGNOSIS — F321 Major depressive disorder, single episode, moderate: Secondary | ICD-10-CM | POA: Diagnosis not present

## 2021-02-23 DIAGNOSIS — R69 Illness, unspecified: Secondary | ICD-10-CM | POA: Diagnosis not present

## 2021-02-23 DIAGNOSIS — F321 Major depressive disorder, single episode, moderate: Secondary | ICD-10-CM | POA: Diagnosis not present

## 2021-02-26 DIAGNOSIS — H90A22 Sensorineural hearing loss, unilateral, left ear, with restricted hearing on the contralateral side: Secondary | ICD-10-CM | POA: Diagnosis not present

## 2021-02-26 DIAGNOSIS — H90A31 Mixed conductive and sensorineural hearing loss, unilateral, right ear with restricted hearing on the contralateral side: Secondary | ICD-10-CM | POA: Diagnosis not present

## 2021-03-01 DIAGNOSIS — F321 Major depressive disorder, single episode, moderate: Secondary | ICD-10-CM | POA: Diagnosis not present

## 2021-03-01 DIAGNOSIS — R69 Illness, unspecified: Secondary | ICD-10-CM | POA: Diagnosis not present

## 2021-03-05 DIAGNOSIS — L02429 Furuncle of limb, unspecified: Secondary | ICD-10-CM | POA: Diagnosis not present

## 2021-03-05 DIAGNOSIS — R52 Pain, unspecified: Secondary | ICD-10-CM | POA: Diagnosis not present

## 2021-03-05 DIAGNOSIS — R69 Illness, unspecified: Secondary | ICD-10-CM | POA: Diagnosis not present

## 2021-03-06 DIAGNOSIS — F321 Major depressive disorder, single episode, moderate: Secondary | ICD-10-CM | POA: Diagnosis not present

## 2021-03-06 DIAGNOSIS — R69 Illness, unspecified: Secondary | ICD-10-CM | POA: Diagnosis not present

## 2021-03-08 DIAGNOSIS — E440 Moderate protein-calorie malnutrition: Secondary | ICD-10-CM | POA: Diagnosis not present

## 2021-03-08 DIAGNOSIS — L02429 Furuncle of limb, unspecified: Secondary | ICD-10-CM | POA: Diagnosis not present

## 2021-03-08 DIAGNOSIS — Z5181 Encounter for therapeutic drug level monitoring: Secondary | ICD-10-CM | POA: Diagnosis not present

## 2021-03-09 DIAGNOSIS — R0602 Shortness of breath: Secondary | ICD-10-CM | POA: Diagnosis not present

## 2021-03-09 DIAGNOSIS — F329 Major depressive disorder, single episode, unspecified: Secondary | ICD-10-CM | POA: Diagnosis not present

## 2021-03-09 DIAGNOSIS — R69 Illness, unspecified: Secondary | ICD-10-CM | POA: Diagnosis not present

## 2021-03-09 DIAGNOSIS — F0390 Unspecified dementia without behavioral disturbance: Secondary | ICD-10-CM | POA: Diagnosis not present

## 2021-03-13 DIAGNOSIS — F321 Major depressive disorder, single episode, moderate: Secondary | ICD-10-CM | POA: Diagnosis not present

## 2021-03-13 DIAGNOSIS — R69 Illness, unspecified: Secondary | ICD-10-CM | POA: Diagnosis not present

## 2021-03-16 DIAGNOSIS — G301 Alzheimer's disease with late onset: Secondary | ICD-10-CM | POA: Diagnosis not present

## 2021-03-16 DIAGNOSIS — I10 Essential (primary) hypertension: Secondary | ICD-10-CM | POA: Diagnosis not present

## 2021-03-16 DIAGNOSIS — R69 Illness, unspecified: Secondary | ICD-10-CM | POA: Diagnosis not present

## 2021-03-16 DIAGNOSIS — I700 Atherosclerosis of aorta: Secondary | ICD-10-CM | POA: Diagnosis not present

## 2021-03-16 DIAGNOSIS — K50118 Crohn's disease of large intestine with other complication: Secondary | ICD-10-CM | POA: Diagnosis not present

## 2021-03-16 DIAGNOSIS — Z79899 Other long term (current) drug therapy: Secondary | ICD-10-CM | POA: Diagnosis not present

## 2021-03-21 DIAGNOSIS — F321 Major depressive disorder, single episode, moderate: Secondary | ICD-10-CM | POA: Diagnosis not present

## 2021-03-21 DIAGNOSIS — R69 Illness, unspecified: Secondary | ICD-10-CM | POA: Diagnosis not present

## 2021-03-27 DIAGNOSIS — R69 Illness, unspecified: Secondary | ICD-10-CM | POA: Diagnosis not present

## 2021-03-27 DIAGNOSIS — F321 Major depressive disorder, single episode, moderate: Secondary | ICD-10-CM | POA: Diagnosis not present

## 2021-04-02 DIAGNOSIS — R69 Illness, unspecified: Secondary | ICD-10-CM | POA: Diagnosis not present

## 2021-04-02 DIAGNOSIS — F321 Major depressive disorder, single episode, moderate: Secondary | ICD-10-CM | POA: Diagnosis not present

## 2021-04-12 DIAGNOSIS — L603 Nail dystrophy: Secondary | ICD-10-CM | POA: Diagnosis not present

## 2021-04-12 DIAGNOSIS — I7091 Generalized atherosclerosis: Secondary | ICD-10-CM | POA: Diagnosis not present

## 2021-04-20 DIAGNOSIS — R69 Illness, unspecified: Secondary | ICD-10-CM | POA: Diagnosis not present

## 2021-04-20 DIAGNOSIS — S2241XD Multiple fractures of ribs, right side, subsequent encounter for fracture with routine healing: Secondary | ICD-10-CM | POA: Diagnosis not present

## 2021-04-20 DIAGNOSIS — R278 Other lack of coordination: Secondary | ICD-10-CM | POA: Diagnosis not present

## 2021-04-20 DIAGNOSIS — I5032 Chronic diastolic (congestive) heart failure: Secondary | ICD-10-CM | POA: Diagnosis not present

## 2021-04-20 DIAGNOSIS — F0280 Dementia in other diseases classified elsewhere without behavioral disturbance: Secondary | ICD-10-CM | POA: Diagnosis not present

## 2021-04-23 DIAGNOSIS — S2241XD Multiple fractures of ribs, right side, subsequent encounter for fracture with routine healing: Secondary | ICD-10-CM | POA: Diagnosis not present

## 2021-04-23 DIAGNOSIS — I5032 Chronic diastolic (congestive) heart failure: Secondary | ICD-10-CM | POA: Diagnosis not present

## 2021-04-23 DIAGNOSIS — R69 Illness, unspecified: Secondary | ICD-10-CM | POA: Diagnosis not present

## 2021-04-23 DIAGNOSIS — F321 Major depressive disorder, single episode, moderate: Secondary | ICD-10-CM | POA: Diagnosis not present

## 2021-04-23 DIAGNOSIS — R278 Other lack of coordination: Secondary | ICD-10-CM | POA: Diagnosis not present

## 2021-04-23 DIAGNOSIS — F0280 Dementia in other diseases classified elsewhere without behavioral disturbance: Secondary | ICD-10-CM | POA: Diagnosis not present

## 2021-04-24 DIAGNOSIS — R69 Illness, unspecified: Secondary | ICD-10-CM | POA: Diagnosis not present

## 2021-04-24 DIAGNOSIS — R278 Other lack of coordination: Secondary | ICD-10-CM | POA: Diagnosis not present

## 2021-04-24 DIAGNOSIS — I5032 Chronic diastolic (congestive) heart failure: Secondary | ICD-10-CM | POA: Diagnosis not present

## 2021-04-24 DIAGNOSIS — S2241XD Multiple fractures of ribs, right side, subsequent encounter for fracture with routine healing: Secondary | ICD-10-CM | POA: Diagnosis not present

## 2021-04-24 DIAGNOSIS — F0280 Dementia in other diseases classified elsewhere without behavioral disturbance: Secondary | ICD-10-CM | POA: Diagnosis not present

## 2021-04-25 DIAGNOSIS — R69 Illness, unspecified: Secondary | ICD-10-CM | POA: Diagnosis not present

## 2021-04-25 DIAGNOSIS — F0280 Dementia in other diseases classified elsewhere without behavioral disturbance: Secondary | ICD-10-CM | POA: Diagnosis not present

## 2021-04-25 DIAGNOSIS — R278 Other lack of coordination: Secondary | ICD-10-CM | POA: Diagnosis not present

## 2021-04-25 DIAGNOSIS — I5032 Chronic diastolic (congestive) heart failure: Secondary | ICD-10-CM | POA: Diagnosis not present

## 2021-04-25 DIAGNOSIS — S2241XD Multiple fractures of ribs, right side, subsequent encounter for fracture with routine healing: Secondary | ICD-10-CM | POA: Diagnosis not present

## 2021-04-26 DIAGNOSIS — F0280 Dementia in other diseases classified elsewhere without behavioral disturbance: Secondary | ICD-10-CM | POA: Diagnosis not present

## 2021-04-26 DIAGNOSIS — R278 Other lack of coordination: Secondary | ICD-10-CM | POA: Diagnosis not present

## 2021-04-26 DIAGNOSIS — R69 Illness, unspecified: Secondary | ICD-10-CM | POA: Diagnosis not present

## 2021-04-26 DIAGNOSIS — I5032 Chronic diastolic (congestive) heart failure: Secondary | ICD-10-CM | POA: Diagnosis not present

## 2021-04-26 DIAGNOSIS — S2241XD Multiple fractures of ribs, right side, subsequent encounter for fracture with routine healing: Secondary | ICD-10-CM | POA: Diagnosis not present

## 2021-04-27 DIAGNOSIS — I5032 Chronic diastolic (congestive) heart failure: Secondary | ICD-10-CM | POA: Diagnosis not present

## 2021-04-27 DIAGNOSIS — R278 Other lack of coordination: Secondary | ICD-10-CM | POA: Diagnosis not present

## 2021-04-27 DIAGNOSIS — S2241XD Multiple fractures of ribs, right side, subsequent encounter for fracture with routine healing: Secondary | ICD-10-CM | POA: Diagnosis not present

## 2021-04-27 DIAGNOSIS — R69 Illness, unspecified: Secondary | ICD-10-CM | POA: Diagnosis not present

## 2021-04-27 DIAGNOSIS — F0280 Dementia in other diseases classified elsewhere without behavioral disturbance: Secondary | ICD-10-CM | POA: Diagnosis not present

## 2021-04-29 DIAGNOSIS — F0280 Dementia in other diseases classified elsewhere without behavioral disturbance: Secondary | ICD-10-CM | POA: Diagnosis not present

## 2021-04-29 DIAGNOSIS — R278 Other lack of coordination: Secondary | ICD-10-CM | POA: Diagnosis not present

## 2021-04-29 DIAGNOSIS — S2241XD Multiple fractures of ribs, right side, subsequent encounter for fracture with routine healing: Secondary | ICD-10-CM | POA: Diagnosis not present

## 2021-04-29 DIAGNOSIS — R69 Illness, unspecified: Secondary | ICD-10-CM | POA: Diagnosis not present

## 2021-04-29 DIAGNOSIS — I5032 Chronic diastolic (congestive) heart failure: Secondary | ICD-10-CM | POA: Diagnosis not present

## 2021-04-30 DIAGNOSIS — R278 Other lack of coordination: Secondary | ICD-10-CM | POA: Diagnosis not present

## 2021-04-30 DIAGNOSIS — S2241XD Multiple fractures of ribs, right side, subsequent encounter for fracture with routine healing: Secondary | ICD-10-CM | POA: Diagnosis not present

## 2021-04-30 DIAGNOSIS — R69 Illness, unspecified: Secondary | ICD-10-CM | POA: Diagnosis not present

## 2021-04-30 DIAGNOSIS — I5032 Chronic diastolic (congestive) heart failure: Secondary | ICD-10-CM | POA: Diagnosis not present

## 2021-04-30 DIAGNOSIS — F0280 Dementia in other diseases classified elsewhere without behavioral disturbance: Secondary | ICD-10-CM | POA: Diagnosis not present

## 2021-04-30 DIAGNOSIS — F321 Major depressive disorder, single episode, moderate: Secondary | ICD-10-CM | POA: Diagnosis not present

## 2021-05-01 DIAGNOSIS — R278 Other lack of coordination: Secondary | ICD-10-CM | POA: Diagnosis not present

## 2021-05-01 DIAGNOSIS — F0280 Dementia in other diseases classified elsewhere without behavioral disturbance: Secondary | ICD-10-CM | POA: Diagnosis not present

## 2021-05-01 DIAGNOSIS — S2241XD Multiple fractures of ribs, right side, subsequent encounter for fracture with routine healing: Secondary | ICD-10-CM | POA: Diagnosis not present

## 2021-05-01 DIAGNOSIS — R69 Illness, unspecified: Secondary | ICD-10-CM | POA: Diagnosis not present

## 2021-05-01 DIAGNOSIS — I5032 Chronic diastolic (congestive) heart failure: Secondary | ICD-10-CM | POA: Diagnosis not present

## 2021-05-02 DIAGNOSIS — I5032 Chronic diastolic (congestive) heart failure: Secondary | ICD-10-CM | POA: Diagnosis not present

## 2021-05-02 DIAGNOSIS — I10 Essential (primary) hypertension: Secondary | ICD-10-CM | POA: Diagnosis not present

## 2021-05-02 DIAGNOSIS — R69 Illness, unspecified: Secondary | ICD-10-CM | POA: Diagnosis not present

## 2021-05-02 DIAGNOSIS — E440 Moderate protein-calorie malnutrition: Secondary | ICD-10-CM | POA: Diagnosis not present

## 2021-05-04 DIAGNOSIS — F321 Major depressive disorder, single episode, moderate: Secondary | ICD-10-CM | POA: Diagnosis not present

## 2021-05-04 DIAGNOSIS — R69 Illness, unspecified: Secondary | ICD-10-CM | POA: Diagnosis not present

## 2021-05-05 DIAGNOSIS — S2241XD Multiple fractures of ribs, right side, subsequent encounter for fracture with routine healing: Secondary | ICD-10-CM | POA: Diagnosis not present

## 2021-05-05 DIAGNOSIS — R278 Other lack of coordination: Secondary | ICD-10-CM | POA: Diagnosis not present

## 2021-05-05 DIAGNOSIS — F0280 Dementia in other diseases classified elsewhere without behavioral disturbance: Secondary | ICD-10-CM | POA: Diagnosis not present

## 2021-05-05 DIAGNOSIS — I5032 Chronic diastolic (congestive) heart failure: Secondary | ICD-10-CM | POA: Diagnosis not present

## 2021-05-05 DIAGNOSIS — R69 Illness, unspecified: Secondary | ICD-10-CM | POA: Diagnosis not present

## 2021-05-09 DIAGNOSIS — F0280 Dementia in other diseases classified elsewhere without behavioral disturbance: Secondary | ICD-10-CM | POA: Diagnosis not present

## 2021-05-09 DIAGNOSIS — R278 Other lack of coordination: Secondary | ICD-10-CM | POA: Diagnosis not present

## 2021-05-09 DIAGNOSIS — S2241XD Multiple fractures of ribs, right side, subsequent encounter for fracture with routine healing: Secondary | ICD-10-CM | POA: Diagnosis not present

## 2021-05-09 DIAGNOSIS — I5032 Chronic diastolic (congestive) heart failure: Secondary | ICD-10-CM | POA: Diagnosis not present

## 2021-05-09 DIAGNOSIS — R69 Illness, unspecified: Secondary | ICD-10-CM | POA: Diagnosis not present

## 2021-05-10 DIAGNOSIS — Z4789 Encounter for other orthopedic aftercare: Secondary | ICD-10-CM | POA: Diagnosis not present

## 2021-05-10 DIAGNOSIS — F0280 Dementia in other diseases classified elsewhere without behavioral disturbance: Secondary | ICD-10-CM | POA: Diagnosis not present

## 2021-05-10 DIAGNOSIS — R278 Other lack of coordination: Secondary | ICD-10-CM | POA: Diagnosis not present

## 2021-05-10 DIAGNOSIS — R69 Illness, unspecified: Secondary | ICD-10-CM | POA: Diagnosis not present

## 2021-05-10 DIAGNOSIS — S2241XD Multiple fractures of ribs, right side, subsequent encounter for fracture with routine healing: Secondary | ICD-10-CM | POA: Diagnosis not present

## 2021-05-10 DIAGNOSIS — I5032 Chronic diastolic (congestive) heart failure: Secondary | ICD-10-CM | POA: Diagnosis not present

## 2021-05-11 DIAGNOSIS — Z4789 Encounter for other orthopedic aftercare: Secondary | ICD-10-CM | POA: Diagnosis not present

## 2021-05-11 DIAGNOSIS — R69 Illness, unspecified: Secondary | ICD-10-CM | POA: Diagnosis not present

## 2021-05-11 DIAGNOSIS — I5032 Chronic diastolic (congestive) heart failure: Secondary | ICD-10-CM | POA: Diagnosis not present

## 2021-05-11 DIAGNOSIS — R278 Other lack of coordination: Secondary | ICD-10-CM | POA: Diagnosis not present

## 2021-05-11 DIAGNOSIS — F0280 Dementia in other diseases classified elsewhere without behavioral disturbance: Secondary | ICD-10-CM | POA: Diagnosis not present

## 2021-05-11 DIAGNOSIS — S2241XD Multiple fractures of ribs, right side, subsequent encounter for fracture with routine healing: Secondary | ICD-10-CM | POA: Diagnosis not present

## 2021-05-13 DIAGNOSIS — S2241XD Multiple fractures of ribs, right side, subsequent encounter for fracture with routine healing: Secondary | ICD-10-CM | POA: Diagnosis not present

## 2021-05-13 DIAGNOSIS — R278 Other lack of coordination: Secondary | ICD-10-CM | POA: Diagnosis not present

## 2021-05-13 DIAGNOSIS — I5032 Chronic diastolic (congestive) heart failure: Secondary | ICD-10-CM | POA: Diagnosis not present

## 2021-05-13 DIAGNOSIS — R69 Illness, unspecified: Secondary | ICD-10-CM | POA: Diagnosis not present

## 2021-05-13 DIAGNOSIS — Z4789 Encounter for other orthopedic aftercare: Secondary | ICD-10-CM | POA: Diagnosis not present

## 2021-05-13 DIAGNOSIS — F0280 Dementia in other diseases classified elsewhere without behavioral disturbance: Secondary | ICD-10-CM | POA: Diagnosis not present

## 2021-05-15 DIAGNOSIS — R69 Illness, unspecified: Secondary | ICD-10-CM | POA: Diagnosis not present

## 2021-05-15 DIAGNOSIS — Z4789 Encounter for other orthopedic aftercare: Secondary | ICD-10-CM | POA: Diagnosis not present

## 2021-05-15 DIAGNOSIS — R278 Other lack of coordination: Secondary | ICD-10-CM | POA: Diagnosis not present

## 2021-05-15 DIAGNOSIS — F0280 Dementia in other diseases classified elsewhere without behavioral disturbance: Secondary | ICD-10-CM | POA: Diagnosis not present

## 2021-05-15 DIAGNOSIS — I5032 Chronic diastolic (congestive) heart failure: Secondary | ICD-10-CM | POA: Diagnosis not present

## 2021-05-15 DIAGNOSIS — S2241XD Multiple fractures of ribs, right side, subsequent encounter for fracture with routine healing: Secondary | ICD-10-CM | POA: Diagnosis not present

## 2021-05-16 DIAGNOSIS — R69 Illness, unspecified: Secondary | ICD-10-CM | POA: Diagnosis not present

## 2021-05-16 DIAGNOSIS — F321 Major depressive disorder, single episode, moderate: Secondary | ICD-10-CM | POA: Diagnosis not present

## 2021-05-18 DIAGNOSIS — R69 Illness, unspecified: Secondary | ICD-10-CM | POA: Diagnosis not present

## 2021-05-18 DIAGNOSIS — Z4789 Encounter for other orthopedic aftercare: Secondary | ICD-10-CM | POA: Diagnosis not present

## 2021-05-18 DIAGNOSIS — S2241XD Multiple fractures of ribs, right side, subsequent encounter for fracture with routine healing: Secondary | ICD-10-CM | POA: Diagnosis not present

## 2021-05-18 DIAGNOSIS — I5032 Chronic diastolic (congestive) heart failure: Secondary | ICD-10-CM | POA: Diagnosis not present

## 2021-05-18 DIAGNOSIS — F0280 Dementia in other diseases classified elsewhere without behavioral disturbance: Secondary | ICD-10-CM | POA: Diagnosis not present

## 2021-05-18 DIAGNOSIS — R278 Other lack of coordination: Secondary | ICD-10-CM | POA: Diagnosis not present

## 2021-05-19 DIAGNOSIS — S2241XD Multiple fractures of ribs, right side, subsequent encounter for fracture with routine healing: Secondary | ICD-10-CM | POA: Diagnosis not present

## 2021-05-19 DIAGNOSIS — R69 Illness, unspecified: Secondary | ICD-10-CM | POA: Diagnosis not present

## 2021-05-19 DIAGNOSIS — R278 Other lack of coordination: Secondary | ICD-10-CM | POA: Diagnosis not present

## 2021-05-19 DIAGNOSIS — F0280 Dementia in other diseases classified elsewhere without behavioral disturbance: Secondary | ICD-10-CM | POA: Diagnosis not present

## 2021-05-19 DIAGNOSIS — Z4789 Encounter for other orthopedic aftercare: Secondary | ICD-10-CM | POA: Diagnosis not present

## 2021-05-19 DIAGNOSIS — I5032 Chronic diastolic (congestive) heart failure: Secondary | ICD-10-CM | POA: Diagnosis not present

## 2021-05-20 DIAGNOSIS — R278 Other lack of coordination: Secondary | ICD-10-CM | POA: Diagnosis not present

## 2021-05-20 DIAGNOSIS — I5032 Chronic diastolic (congestive) heart failure: Secondary | ICD-10-CM | POA: Diagnosis not present

## 2021-05-20 DIAGNOSIS — R69 Illness, unspecified: Secondary | ICD-10-CM | POA: Diagnosis not present

## 2021-05-20 DIAGNOSIS — S2241XD Multiple fractures of ribs, right side, subsequent encounter for fracture with routine healing: Secondary | ICD-10-CM | POA: Diagnosis not present

## 2021-05-20 DIAGNOSIS — Z4789 Encounter for other orthopedic aftercare: Secondary | ICD-10-CM | POA: Diagnosis not present

## 2021-05-20 DIAGNOSIS — F0280 Dementia in other diseases classified elsewhere without behavioral disturbance: Secondary | ICD-10-CM | POA: Diagnosis not present

## 2021-05-22 DIAGNOSIS — F321 Major depressive disorder, single episode, moderate: Secondary | ICD-10-CM | POA: Diagnosis not present

## 2021-05-22 DIAGNOSIS — R69 Illness, unspecified: Secondary | ICD-10-CM | POA: Diagnosis not present

## 2021-05-23 DIAGNOSIS — Z4789 Encounter for other orthopedic aftercare: Secondary | ICD-10-CM | POA: Diagnosis not present

## 2021-05-23 DIAGNOSIS — I5032 Chronic diastolic (congestive) heart failure: Secondary | ICD-10-CM | POA: Diagnosis not present

## 2021-05-23 DIAGNOSIS — R278 Other lack of coordination: Secondary | ICD-10-CM | POA: Diagnosis not present

## 2021-05-23 DIAGNOSIS — S2241XD Multiple fractures of ribs, right side, subsequent encounter for fracture with routine healing: Secondary | ICD-10-CM | POA: Diagnosis not present

## 2021-05-23 DIAGNOSIS — R69 Illness, unspecified: Secondary | ICD-10-CM | POA: Diagnosis not present

## 2021-05-23 DIAGNOSIS — F0280 Dementia in other diseases classified elsewhere without behavioral disturbance: Secondary | ICD-10-CM | POA: Diagnosis not present

## 2021-05-24 DIAGNOSIS — R69 Illness, unspecified: Secondary | ICD-10-CM | POA: Diagnosis not present

## 2021-05-24 DIAGNOSIS — Z4789 Encounter for other orthopedic aftercare: Secondary | ICD-10-CM | POA: Diagnosis not present

## 2021-05-24 DIAGNOSIS — R278 Other lack of coordination: Secondary | ICD-10-CM | POA: Diagnosis not present

## 2021-05-24 DIAGNOSIS — S2241XD Multiple fractures of ribs, right side, subsequent encounter for fracture with routine healing: Secondary | ICD-10-CM | POA: Diagnosis not present

## 2021-05-24 DIAGNOSIS — F0280 Dementia in other diseases classified elsewhere without behavioral disturbance: Secondary | ICD-10-CM | POA: Diagnosis not present

## 2021-05-24 DIAGNOSIS — I5032 Chronic diastolic (congestive) heart failure: Secondary | ICD-10-CM | POA: Diagnosis not present

## 2021-05-27 DIAGNOSIS — S2241XD Multiple fractures of ribs, right side, subsequent encounter for fracture with routine healing: Secondary | ICD-10-CM | POA: Diagnosis not present

## 2021-05-27 DIAGNOSIS — R69 Illness, unspecified: Secondary | ICD-10-CM | POA: Diagnosis not present

## 2021-05-27 DIAGNOSIS — I5032 Chronic diastolic (congestive) heart failure: Secondary | ICD-10-CM | POA: Diagnosis not present

## 2021-05-27 DIAGNOSIS — Z4789 Encounter for other orthopedic aftercare: Secondary | ICD-10-CM | POA: Diagnosis not present

## 2021-05-27 DIAGNOSIS — R278 Other lack of coordination: Secondary | ICD-10-CM | POA: Diagnosis not present

## 2021-05-27 DIAGNOSIS — F0280 Dementia in other diseases classified elsewhere without behavioral disturbance: Secondary | ICD-10-CM | POA: Diagnosis not present

## 2021-05-29 DIAGNOSIS — F321 Major depressive disorder, single episode, moderate: Secondary | ICD-10-CM | POA: Diagnosis not present

## 2021-05-29 DIAGNOSIS — R69 Illness, unspecified: Secondary | ICD-10-CM | POA: Diagnosis not present

## 2021-05-30 DIAGNOSIS — R69 Illness, unspecified: Secondary | ICD-10-CM | POA: Diagnosis not present

## 2021-05-30 DIAGNOSIS — Z4789 Encounter for other orthopedic aftercare: Secondary | ICD-10-CM | POA: Diagnosis not present

## 2021-05-30 DIAGNOSIS — I5032 Chronic diastolic (congestive) heart failure: Secondary | ICD-10-CM | POA: Diagnosis not present

## 2021-05-30 DIAGNOSIS — R278 Other lack of coordination: Secondary | ICD-10-CM | POA: Diagnosis not present

## 2021-05-30 DIAGNOSIS — F0280 Dementia in other diseases classified elsewhere without behavioral disturbance: Secondary | ICD-10-CM | POA: Diagnosis not present

## 2021-05-30 DIAGNOSIS — S2241XD Multiple fractures of ribs, right side, subsequent encounter for fracture with routine healing: Secondary | ICD-10-CM | POA: Diagnosis not present

## 2021-06-01 DIAGNOSIS — R69 Illness, unspecified: Secondary | ICD-10-CM | POA: Diagnosis not present

## 2021-06-01 DIAGNOSIS — S2241XD Multiple fractures of ribs, right side, subsequent encounter for fracture with routine healing: Secondary | ICD-10-CM | POA: Diagnosis not present

## 2021-06-01 DIAGNOSIS — Z4789 Encounter for other orthopedic aftercare: Secondary | ICD-10-CM | POA: Diagnosis not present

## 2021-06-01 DIAGNOSIS — I5032 Chronic diastolic (congestive) heart failure: Secondary | ICD-10-CM | POA: Diagnosis not present

## 2021-06-01 DIAGNOSIS — R278 Other lack of coordination: Secondary | ICD-10-CM | POA: Diagnosis not present

## 2021-06-01 DIAGNOSIS — F0280 Dementia in other diseases classified elsewhere without behavioral disturbance: Secondary | ICD-10-CM | POA: Diagnosis not present

## 2021-06-05 DIAGNOSIS — F321 Major depressive disorder, single episode, moderate: Secondary | ICD-10-CM | POA: Diagnosis not present

## 2021-06-05 DIAGNOSIS — R69 Illness, unspecified: Secondary | ICD-10-CM | POA: Diagnosis not present

## 2021-06-08 DIAGNOSIS — R278 Other lack of coordination: Secondary | ICD-10-CM | POA: Diagnosis not present

## 2021-06-08 DIAGNOSIS — I5032 Chronic diastolic (congestive) heart failure: Secondary | ICD-10-CM | POA: Diagnosis not present

## 2021-06-08 DIAGNOSIS — R69 Illness, unspecified: Secondary | ICD-10-CM | POA: Diagnosis not present

## 2021-06-08 DIAGNOSIS — Z4789 Encounter for other orthopedic aftercare: Secondary | ICD-10-CM | POA: Diagnosis not present

## 2021-06-08 DIAGNOSIS — F0280 Dementia in other diseases classified elsewhere without behavioral disturbance: Secondary | ICD-10-CM | POA: Diagnosis not present

## 2021-06-08 DIAGNOSIS — S2241XD Multiple fractures of ribs, right side, subsequent encounter for fracture with routine healing: Secondary | ICD-10-CM | POA: Diagnosis not present

## 2021-06-14 DIAGNOSIS — R69 Illness, unspecified: Secondary | ICD-10-CM | POA: Diagnosis not present

## 2021-06-14 DIAGNOSIS — F321 Major depressive disorder, single episode, moderate: Secondary | ICD-10-CM | POA: Diagnosis not present

## 2021-06-20 DIAGNOSIS — F321 Major depressive disorder, single episode, moderate: Secondary | ICD-10-CM | POA: Diagnosis not present

## 2021-06-20 DIAGNOSIS — R69 Illness, unspecified: Secondary | ICD-10-CM | POA: Diagnosis not present

## 2021-06-26 DIAGNOSIS — R69 Illness, unspecified: Secondary | ICD-10-CM | POA: Diagnosis not present

## 2021-06-26 DIAGNOSIS — F321 Major depressive disorder, single episode, moderate: Secondary | ICD-10-CM | POA: Diagnosis not present

## 2021-07-03 DIAGNOSIS — R69 Illness, unspecified: Secondary | ICD-10-CM | POA: Diagnosis not present

## 2021-07-03 DIAGNOSIS — F321 Major depressive disorder, single episode, moderate: Secondary | ICD-10-CM | POA: Diagnosis not present

## 2021-07-05 DIAGNOSIS — R110 Nausea: Secondary | ICD-10-CM | POA: Diagnosis not present

## 2021-07-05 DIAGNOSIS — R3981 Functional urinary incontinence: Secondary | ICD-10-CM | POA: Diagnosis not present

## 2021-07-05 DIAGNOSIS — N390 Urinary tract infection, site not specified: Secondary | ICD-10-CM | POA: Diagnosis not present

## 2021-07-05 DIAGNOSIS — Z7409 Other reduced mobility: Secondary | ICD-10-CM | POA: Diagnosis not present

## 2021-07-05 DIAGNOSIS — R69 Illness, unspecified: Secondary | ICD-10-CM | POA: Diagnosis not present

## 2021-07-05 DIAGNOSIS — I5032 Chronic diastolic (congestive) heart failure: Secondary | ICD-10-CM | POA: Diagnosis not present

## 2021-07-05 DIAGNOSIS — I10 Essential (primary) hypertension: Secondary | ICD-10-CM | POA: Diagnosis not present

## 2021-07-10 DIAGNOSIS — R69 Illness, unspecified: Secondary | ICD-10-CM | POA: Diagnosis not present

## 2021-07-10 DIAGNOSIS — F321 Major depressive disorder, single episode, moderate: Secondary | ICD-10-CM | POA: Diagnosis not present

## 2021-07-19 DIAGNOSIS — R69 Illness, unspecified: Secondary | ICD-10-CM | POA: Diagnosis not present

## 2021-07-19 DIAGNOSIS — F321 Major depressive disorder, single episode, moderate: Secondary | ICD-10-CM | POA: Diagnosis not present

## 2021-07-29 DIAGNOSIS — R69 Illness, unspecified: Secondary | ICD-10-CM | POA: Diagnosis not present

## 2021-07-29 DIAGNOSIS — F321 Major depressive disorder, single episode, moderate: Secondary | ICD-10-CM | POA: Diagnosis not present

## 2021-07-30 DIAGNOSIS — N952 Postmenopausal atrophic vaginitis: Secondary | ICD-10-CM | POA: Diagnosis not present

## 2021-07-30 DIAGNOSIS — Z4689 Encounter for fitting and adjustment of other specified devices: Secondary | ICD-10-CM | POA: Diagnosis not present

## 2021-07-30 DIAGNOSIS — N813 Complete uterovaginal prolapse: Secondary | ICD-10-CM | POA: Diagnosis not present

## 2021-07-30 DIAGNOSIS — N3281 Overactive bladder: Secondary | ICD-10-CM | POA: Diagnosis not present

## 2021-08-06 DIAGNOSIS — R69 Illness, unspecified: Secondary | ICD-10-CM | POA: Diagnosis not present

## 2021-08-06 DIAGNOSIS — F321 Major depressive disorder, single episode, moderate: Secondary | ICD-10-CM | POA: Diagnosis not present

## 2021-08-13 DIAGNOSIS — R69 Illness, unspecified: Secondary | ICD-10-CM | POA: Diagnosis not present

## 2021-08-13 DIAGNOSIS — F321 Major depressive disorder, single episode, moderate: Secondary | ICD-10-CM | POA: Diagnosis not present

## 2021-08-22 DIAGNOSIS — I5032 Chronic diastolic (congestive) heart failure: Secondary | ICD-10-CM | POA: Diagnosis not present

## 2021-08-22 DIAGNOSIS — R69 Illness, unspecified: Secondary | ICD-10-CM | POA: Diagnosis not present

## 2021-08-22 DIAGNOSIS — E440 Moderate protein-calorie malnutrition: Secondary | ICD-10-CM | POA: Diagnosis not present

## 2021-08-22 DIAGNOSIS — R5381 Other malaise: Secondary | ICD-10-CM | POA: Diagnosis not present

## 2021-08-23 DIAGNOSIS — F321 Major depressive disorder, single episode, moderate: Secondary | ICD-10-CM | POA: Diagnosis not present

## 2021-08-23 DIAGNOSIS — R69 Illness, unspecified: Secondary | ICD-10-CM | POA: Diagnosis not present

## 2021-08-30 DIAGNOSIS — F321 Major depressive disorder, single episode, moderate: Secondary | ICD-10-CM | POA: Diagnosis not present

## 2021-08-30 DIAGNOSIS — R69 Illness, unspecified: Secondary | ICD-10-CM | POA: Diagnosis not present

## 2021-09-05 DIAGNOSIS — F321 Major depressive disorder, single episode, moderate: Secondary | ICD-10-CM | POA: Diagnosis not present

## 2021-09-05 DIAGNOSIS — R69 Illness, unspecified: Secondary | ICD-10-CM | POA: Diagnosis not present

## 2021-09-10 DIAGNOSIS — I5032 Chronic diastolic (congestive) heart failure: Secondary | ICD-10-CM | POA: Diagnosis not present

## 2021-09-10 DIAGNOSIS — R41841 Cognitive communication deficit: Secondary | ICD-10-CM | POA: Diagnosis not present

## 2021-09-10 DIAGNOSIS — R1312 Dysphagia, oropharyngeal phase: Secondary | ICD-10-CM | POA: Diagnosis not present

## 2021-09-10 DIAGNOSIS — S2241XD Multiple fractures of ribs, right side, subsequent encounter for fracture with routine healing: Secondary | ICD-10-CM | POA: Diagnosis not present

## 2021-09-14 DIAGNOSIS — R41841 Cognitive communication deficit: Secondary | ICD-10-CM | POA: Diagnosis not present

## 2021-09-14 DIAGNOSIS — R1312 Dysphagia, oropharyngeal phase: Secondary | ICD-10-CM | POA: Diagnosis not present

## 2021-09-14 DIAGNOSIS — I5032 Chronic diastolic (congestive) heart failure: Secondary | ICD-10-CM | POA: Diagnosis not present

## 2021-09-14 DIAGNOSIS — S2241XD Multiple fractures of ribs, right side, subsequent encounter for fracture with routine healing: Secondary | ICD-10-CM | POA: Diagnosis not present

## 2021-09-17 DIAGNOSIS — I5032 Chronic diastolic (congestive) heart failure: Secondary | ICD-10-CM | POA: Diagnosis not present

## 2021-09-17 DIAGNOSIS — R1312 Dysphagia, oropharyngeal phase: Secondary | ICD-10-CM | POA: Diagnosis not present

## 2021-09-17 DIAGNOSIS — S2241XD Multiple fractures of ribs, right side, subsequent encounter for fracture with routine healing: Secondary | ICD-10-CM | POA: Diagnosis not present

## 2021-09-17 DIAGNOSIS — R41841 Cognitive communication deficit: Secondary | ICD-10-CM | POA: Diagnosis not present

## 2021-09-18 DIAGNOSIS — I7091 Generalized atherosclerosis: Secondary | ICD-10-CM | POA: Diagnosis not present

## 2021-09-19 DIAGNOSIS — F321 Major depressive disorder, single episode, moderate: Secondary | ICD-10-CM | POA: Diagnosis not present

## 2021-09-19 DIAGNOSIS — R69 Illness, unspecified: Secondary | ICD-10-CM | POA: Diagnosis not present

## 2021-09-21 DIAGNOSIS — I700 Atherosclerosis of aorta: Secondary | ICD-10-CM | POA: Diagnosis not present

## 2021-09-21 DIAGNOSIS — Z79899 Other long term (current) drug therapy: Secondary | ICD-10-CM | POA: Diagnosis not present

## 2021-09-21 DIAGNOSIS — I340 Nonrheumatic mitral (valve) insufficiency: Secondary | ICD-10-CM | POA: Diagnosis not present

## 2021-09-21 DIAGNOSIS — I10 Essential (primary) hypertension: Secondary | ICD-10-CM | POA: Diagnosis not present

## 2021-09-21 DIAGNOSIS — G301 Alzheimer's disease with late onset: Secondary | ICD-10-CM | POA: Diagnosis not present

## 2021-09-21 DIAGNOSIS — K50118 Crohn's disease of large intestine with other complication: Secondary | ICD-10-CM | POA: Diagnosis not present

## 2021-09-21 DIAGNOSIS — R69 Illness, unspecified: Secondary | ICD-10-CM | POA: Diagnosis not present

## 2021-09-24 DIAGNOSIS — R69 Illness, unspecified: Secondary | ICD-10-CM | POA: Diagnosis not present

## 2021-09-24 DIAGNOSIS — F321 Major depressive disorder, single episode, moderate: Secondary | ICD-10-CM | POA: Diagnosis not present

## 2021-09-24 DIAGNOSIS — I110 Hypertensive heart disease with heart failure: Secondary | ICD-10-CM | POA: Diagnosis not present

## 2021-09-25 DIAGNOSIS — S2241XD Multiple fractures of ribs, right side, subsequent encounter for fracture with routine healing: Secondary | ICD-10-CM | POA: Diagnosis not present

## 2021-09-25 DIAGNOSIS — R41841 Cognitive communication deficit: Secondary | ICD-10-CM | POA: Diagnosis not present

## 2021-09-25 DIAGNOSIS — R1312 Dysphagia, oropharyngeal phase: Secondary | ICD-10-CM | POA: Diagnosis not present

## 2021-09-25 DIAGNOSIS — I5032 Chronic diastolic (congestive) heart failure: Secondary | ICD-10-CM | POA: Diagnosis not present

## 2021-09-27 DIAGNOSIS — R41841 Cognitive communication deficit: Secondary | ICD-10-CM | POA: Diagnosis not present

## 2021-09-27 DIAGNOSIS — R1312 Dysphagia, oropharyngeal phase: Secondary | ICD-10-CM | POA: Diagnosis not present

## 2021-09-27 DIAGNOSIS — I5032 Chronic diastolic (congestive) heart failure: Secondary | ICD-10-CM | POA: Diagnosis not present

## 2021-09-27 DIAGNOSIS — S2241XD Multiple fractures of ribs, right side, subsequent encounter for fracture with routine healing: Secondary | ICD-10-CM | POA: Diagnosis not present

## 2021-10-01 DIAGNOSIS — R69 Illness, unspecified: Secondary | ICD-10-CM | POA: Diagnosis not present

## 2021-10-01 DIAGNOSIS — I5032 Chronic diastolic (congestive) heart failure: Secondary | ICD-10-CM | POA: Diagnosis not present

## 2021-10-03 DIAGNOSIS — R41841 Cognitive communication deficit: Secondary | ICD-10-CM | POA: Diagnosis not present

## 2021-10-03 DIAGNOSIS — I5032 Chronic diastolic (congestive) heart failure: Secondary | ICD-10-CM | POA: Diagnosis not present

## 2021-10-03 DIAGNOSIS — R1312 Dysphagia, oropharyngeal phase: Secondary | ICD-10-CM | POA: Diagnosis not present

## 2021-10-03 DIAGNOSIS — S2241XD Multiple fractures of ribs, right side, subsequent encounter for fracture with routine healing: Secondary | ICD-10-CM | POA: Diagnosis not present

## 2021-10-04 DIAGNOSIS — I5032 Chronic diastolic (congestive) heart failure: Secondary | ICD-10-CM | POA: Diagnosis not present

## 2021-10-04 DIAGNOSIS — S2241XD Multiple fractures of ribs, right side, subsequent encounter for fracture with routine healing: Secondary | ICD-10-CM | POA: Diagnosis not present

## 2021-10-04 DIAGNOSIS — R1312 Dysphagia, oropharyngeal phase: Secondary | ICD-10-CM | POA: Diagnosis not present

## 2021-10-04 DIAGNOSIS — R41841 Cognitive communication deficit: Secondary | ICD-10-CM | POA: Diagnosis not present

## 2021-10-07 DIAGNOSIS — R69 Illness, unspecified: Secondary | ICD-10-CM | POA: Diagnosis not present

## 2021-10-07 DIAGNOSIS — F321 Major depressive disorder, single episode, moderate: Secondary | ICD-10-CM | POA: Diagnosis not present

## 2021-10-16 DIAGNOSIS — F321 Major depressive disorder, single episode, moderate: Secondary | ICD-10-CM | POA: Diagnosis not present

## 2021-10-16 DIAGNOSIS — R41841 Cognitive communication deficit: Secondary | ICD-10-CM | POA: Diagnosis not present

## 2021-10-16 DIAGNOSIS — S2241XD Multiple fractures of ribs, right side, subsequent encounter for fracture with routine healing: Secondary | ICD-10-CM | POA: Diagnosis not present

## 2021-10-16 DIAGNOSIS — R1312 Dysphagia, oropharyngeal phase: Secondary | ICD-10-CM | POA: Diagnosis not present

## 2021-10-16 DIAGNOSIS — I5032 Chronic diastolic (congestive) heart failure: Secondary | ICD-10-CM | POA: Diagnosis not present

## 2021-10-16 DIAGNOSIS — R69 Illness, unspecified: Secondary | ICD-10-CM | POA: Diagnosis not present

## 2021-10-21 DIAGNOSIS — R1312 Dysphagia, oropharyngeal phase: Secondary | ICD-10-CM | POA: Diagnosis not present

## 2021-10-21 DIAGNOSIS — I5032 Chronic diastolic (congestive) heart failure: Secondary | ICD-10-CM | POA: Diagnosis not present

## 2021-10-21 DIAGNOSIS — R41841 Cognitive communication deficit: Secondary | ICD-10-CM | POA: Diagnosis not present

## 2021-10-21 DIAGNOSIS — S2241XD Multiple fractures of ribs, right side, subsequent encounter for fracture with routine healing: Secondary | ICD-10-CM | POA: Diagnosis not present

## 2021-10-23 DIAGNOSIS — I5032 Chronic diastolic (congestive) heart failure: Secondary | ICD-10-CM | POA: Diagnosis not present

## 2021-10-23 DIAGNOSIS — R69 Illness, unspecified: Secondary | ICD-10-CM | POA: Diagnosis not present

## 2021-10-25 DIAGNOSIS — R41841 Cognitive communication deficit: Secondary | ICD-10-CM | POA: Diagnosis not present

## 2021-10-25 DIAGNOSIS — S2241XD Multiple fractures of ribs, right side, subsequent encounter for fracture with routine healing: Secondary | ICD-10-CM | POA: Diagnosis not present

## 2021-10-25 DIAGNOSIS — R7309 Other abnormal glucose: Secondary | ICD-10-CM | POA: Diagnosis not present

## 2021-10-25 DIAGNOSIS — R1312 Dysphagia, oropharyngeal phase: Secondary | ICD-10-CM | POA: Diagnosis not present

## 2021-10-25 DIAGNOSIS — I10 Essential (primary) hypertension: Secondary | ICD-10-CM | POA: Diagnosis not present

## 2021-10-25 DIAGNOSIS — I5032 Chronic diastolic (congestive) heart failure: Secondary | ICD-10-CM | POA: Diagnosis not present

## 2021-10-29 DIAGNOSIS — R69 Illness, unspecified: Secondary | ICD-10-CM | POA: Diagnosis not present

## 2021-10-29 DIAGNOSIS — E440 Moderate protein-calorie malnutrition: Secondary | ICD-10-CM | POA: Diagnosis not present

## 2021-10-29 DIAGNOSIS — I5032 Chronic diastolic (congestive) heart failure: Secondary | ICD-10-CM | POA: Diagnosis not present

## 2021-10-29 DIAGNOSIS — I10 Essential (primary) hypertension: Secondary | ICD-10-CM | POA: Diagnosis not present

## 2021-10-30 DIAGNOSIS — I5032 Chronic diastolic (congestive) heart failure: Secondary | ICD-10-CM | POA: Diagnosis not present

## 2021-10-30 DIAGNOSIS — R41841 Cognitive communication deficit: Secondary | ICD-10-CM | POA: Diagnosis not present

## 2021-10-30 DIAGNOSIS — R1312 Dysphagia, oropharyngeal phase: Secondary | ICD-10-CM | POA: Diagnosis not present

## 2021-10-30 DIAGNOSIS — S2241XD Multiple fractures of ribs, right side, subsequent encounter for fracture with routine healing: Secondary | ICD-10-CM | POA: Diagnosis not present

## 2021-11-06 DIAGNOSIS — F321 Major depressive disorder, single episode, moderate: Secondary | ICD-10-CM | POA: Diagnosis not present

## 2021-11-06 DIAGNOSIS — R69 Illness, unspecified: Secondary | ICD-10-CM | POA: Diagnosis not present

## 2021-11-09 DIAGNOSIS — R69 Illness, unspecified: Secondary | ICD-10-CM | POA: Diagnosis not present

## 2021-11-09 DIAGNOSIS — I5032 Chronic diastolic (congestive) heart failure: Secondary | ICD-10-CM | POA: Diagnosis not present

## 2021-11-12 DIAGNOSIS — R69 Illness, unspecified: Secondary | ICD-10-CM | POA: Diagnosis not present

## 2021-11-12 DIAGNOSIS — F321 Major depressive disorder, single episode, moderate: Secondary | ICD-10-CM | POA: Diagnosis not present

## 2021-11-15 ENCOUNTER — Encounter (HOSPITAL_COMMUNITY): Payer: Self-pay | Admitting: Emergency Medicine

## 2021-11-15 ENCOUNTER — Other Ambulatory Visit: Payer: Self-pay

## 2021-11-15 ENCOUNTER — Emergency Department (HOSPITAL_COMMUNITY)
Admission: EM | Admit: 2021-11-15 | Discharge: 2021-11-15 | Disposition: A | Discharge status: Home / Self Care | Payer: Medicare HMO | Attending: Emergency Medicine | Admitting: Emergency Medicine

## 2021-11-15 ENCOUNTER — Emergency Department (HOSPITAL_COMMUNITY): Payer: Medicare HMO

## 2021-11-15 DIAGNOSIS — S0101XA Laceration without foreign body of scalp, initial encounter: Secondary | ICD-10-CM | POA: Insufficient documentation

## 2021-11-15 DIAGNOSIS — Z743 Need for continuous supervision: Secondary | ICD-10-CM | POA: Diagnosis not present

## 2021-11-15 DIAGNOSIS — M4802 Spinal stenosis, cervical region: Secondary | ICD-10-CM | POA: Diagnosis not present

## 2021-11-15 DIAGNOSIS — I509 Heart failure, unspecified: Secondary | ICD-10-CM | POA: Insufficient documentation

## 2021-11-15 DIAGNOSIS — S0181XA Laceration without foreign body of other part of head, initial encounter: Secondary | ICD-10-CM | POA: Diagnosis not present

## 2021-11-15 DIAGNOSIS — M47812 Spondylosis without myelopathy or radiculopathy, cervical region: Secondary | ICD-10-CM | POA: Diagnosis not present

## 2021-11-15 DIAGNOSIS — W01198A Fall on same level from slipping, tripping and stumbling with subsequent striking against other object, initial encounter: Secondary | ICD-10-CM | POA: Diagnosis not present

## 2021-11-15 DIAGNOSIS — W19XXXA Unspecified fall, initial encounter: Secondary | ICD-10-CM | POA: Diagnosis not present

## 2021-11-15 DIAGNOSIS — I110 Hypertensive heart disease with heart failure: Secondary | ICD-10-CM | POA: Insufficient documentation

## 2021-11-15 DIAGNOSIS — I672 Cerebral atherosclerosis: Secondary | ICD-10-CM | POA: Diagnosis not present

## 2021-11-15 DIAGNOSIS — S0990XA Unspecified injury of head, initial encounter: Secondary | ICD-10-CM | POA: Diagnosis not present

## 2021-11-15 DIAGNOSIS — R58 Hemorrhage, not elsewhere classified: Secondary | ICD-10-CM | POA: Diagnosis not present

## 2021-11-15 DIAGNOSIS — R69 Illness, unspecified: Secondary | ICD-10-CM | POA: Diagnosis not present

## 2021-11-15 DIAGNOSIS — F0390 Unspecified dementia without behavioral disturbance: Secondary | ICD-10-CM | POA: Diagnosis not present

## 2021-11-15 DIAGNOSIS — I2510 Atherosclerotic heart disease of native coronary artery without angina pectoris: Secondary | ICD-10-CM | POA: Diagnosis not present

## 2021-11-15 DIAGNOSIS — S199XXA Unspecified injury of neck, initial encounter: Secondary | ICD-10-CM | POA: Diagnosis not present

## 2021-11-15 DIAGNOSIS — I6523 Occlusion and stenosis of bilateral carotid arteries: Secondary | ICD-10-CM | POA: Diagnosis not present

## 2021-11-15 DIAGNOSIS — Z79899 Other long term (current) drug therapy: Secondary | ICD-10-CM | POA: Diagnosis not present

## 2021-11-15 MED ORDER — LIDOCAINE-EPINEPHRINE (PF) 2 %-1:200000 IJ SOLN
10.0000 mL | Freq: Once | INTRAMUSCULAR | Status: AC
  Administered 2021-11-15: 10 mL
  Filled 2021-11-15: qty 20

## 2021-11-15 MED ORDER — LIDOCAINE-EPINEPHRINE-TETRACAINE (LET) TOPICAL GEL
3.0000 mL | Freq: Once | TOPICAL | Status: AC
  Administered 2021-11-15: 3 mL via TOPICAL
  Filled 2021-11-15: qty 3

## 2021-11-15 NOTE — ED Notes (Signed)
Discharge instructions reviewed with patient. Patient verbalized understanding of instructions. Follow-up care and medications were reviewed. Patient ambulatory with steady gait. VSS upon discharge.  ?

## 2021-11-15 NOTE — ED Triage Notes (Signed)
Pt arrives via EMS from [place] for unwitnessed fall. Pt is not on blood thinners. Pt was found on the ground after staff heard her fall. Pt hit back of head and has laceration. bleeding controlled at this time. Hx of dementia pt is at her mental baseline. C collar in place from EMS.Pt does not ambulate at baseline- is usually in wheelchair

## 2021-11-15 NOTE — ED Provider Triage Note (Addendum)
Emergency Medicine Provider Triage Evaluation Note  [name] , a 88 y.o. female  was evaluated in triage.  Pt complains of injuries from a fall, not sure why she fell. Woke up on the floor and is now in the ER, not sure events, history of dementia. Alert to person, place. Not on thinners   Call placed to patient's son and [name] who is aware patient is here, reviewed CT head and c-spine, scalp lac, pending formal eval and dispo.  Review of Systems  Positive: Scalp lac  Negative: Extremity pain   Physical Exam  BP (!) 144/95 (BP Location: Right Arm)   Pulse 82   Temp 99 F (37.2 C) (Oral)   Resp 14   SpO2 96%  Gen:   Awake, no distress   Resp:  Normal effort  MSK:   Moves extremities without difficulty  Other:  C- collar in place, lac to posterior scalp, no active bleeding  Medical Decision Making  Medically screening exam initiated at 10:44 AM.  Appropriate orders placed.  [name] was informed that the remainder of the evaluation will be completed by another provider, this initial triage assessment does not replace that evaluation, and the importance of remaining in the ED until their evaluation is complete.     Caitlyn Learn, PA-C 11/15/21 1046    Caitlyn Learn, PA-C 11/15/21 1047    Caitlyn Learn, PA-C 11/15/21 1146

## 2021-11-15 NOTE — ED Provider Notes (Signed)
[facility] EMERGENCY DEPARTMENT Provider Note   CSN: [id number] Arrival date & time: 11/15/21  1034     History  Chief Complaint  Patient presents with   [name]    [name] is a 88 y.o. female.  88 year old female brought in by EMS from facility, staff heard patient fall, walked in to find her alert, on the ground. History of dementia, reported to be at baseline by daughter who is in the department with patient. Patient is aware she fell and that she is at the hospital in [place]. Denies any complaints or concerns. Not on blood thinners.        Home Medications Prior to Admission medications   Medication Sig Start Date End Date Taking? Authorizing Provider  acetaminophen (TYLENOL) 325 MG tablet Take 2 tablets (650 mg total) by mouth every 6 (six) hours. 01/25/20   Norm Parcel, PA-C  amLODipine (NORVASC) 2.5 MG tablet Take 2.5 mg by mouth daily. 09/03/19   [provider]  budesonide (ENTOCORT EC) 3 MG 24 hr capsule Take 3 capsules (9 mg total) by mouth daily. Patient taking differently: Take 3 mg by mouth daily.  11/16/18   Gatha Mayer, MD  calcium-vitamin D (OSCAL WITH D) 500-200 MG-UNIT tablet Take 1 tablet by mouth 2 (two) times daily.    [provider]  docusate sodium (COLACE) 100 MG capsule Take 1 capsule (100 mg total) by mouth 2 (two) times daily. 01/25/20   Norm Parcel, PA-C  donepezil (ARICEPT) 10 MG tablet Take 1 tablet daily Patient taking differently: Take 10 mg by mouth daily.  11/16/18   Cameron Sprang, MD  hydrALAZINE (APRESOLINE) 25 MG tablet Take 1 tablet (25 mg total) by mouth every 8 (eight) hours. Patient taking differently: Take 25 mg by mouth 3 (three) times daily.  05/11/18   Gatha Mayer, MD  lidocaine (LIDODERM) 5 % Place 1 patch onto the skin daily. Remove & Discard patch within 12 hours or as directed by MD 01/25/20   Norm Parcel, PA-C  losartan (COZAAR) 25 MG tablet Take 1 tablet by mouth once  daily Patient taking differently: Take 25 mg by mouth daily.  12/15/18   Tonia Ghent, MD  methocarbamol (ROBAXIN) 500 MG tablet Take 1 tablet (500 mg total) by mouth 3 (three) times daily. 01/25/20   Norm Parcel, PA-C  Multiple Vitamin (MULTIVITAMIN) tablet Take 1 tablet by mouth daily.    [provider]  sertraline (ZOLOFT) 25 MG tablet Take 1 tablet by mouth once daily Patient taking differently: Take 25 mg by mouth daily.  08/13/18   Gatha Mayer, MD      Allergies    Other    Review of Systems   Review of Systems  Unable to perform ROS: Dementia    Physical Exam Updated Vital Signs BP 123/77   Pulse 94   Temp 99.2 F (37.3 C) (Oral)   Resp (!) 22   SpO2 95%  Physical Exam Vitals and nursing note reviewed.  Constitutional:      General: She is not in acute distress.    Appearance: She is well-developed. She is not diaphoretic.       Comments: Approximately 2cm laceration to left posterior parietal scalp, no active bleeding  HENT:     Head: Normocephalic.     Mouth/Throat:     Mouth: Mucous membranes are moist.  Eyes:     Extraocular Movements: Extraocular  movements intact.     Pupils: Pupils are equal, round, and reactive to light.  Cardiovascular:     Pulses: Normal pulses.  Pulmonary:     Effort: Pulmonary effort is normal.  Abdominal:     Palpations: Abdomen is soft.     Tenderness: There is no abdominal tenderness.  Musculoskeletal:     Cervical back: Normal range of motion and neck supple. No tenderness.     Right lower leg: No edema.     Left lower leg: No edema.     Comments: No pain with palpation/ROM upper or lower extremities. No midline neck or back tenderness.   Skin:    General: Skin is warm and dry.     Findings: No erythema or rash.  Neurological:     Mental Status: She is alert and oriented to person, place, and time.  Psychiatric:        Behavior: Behavior normal.     ED Results / Procedures / Treatments   Labs (all  labs ordered are listed, but only abnormal results are displayed) Labs Reviewed - No data to display  EKG None  Radiology CT Head Wo Contrast  Result Date: 11/15/2021 CLINICAL DATA:  Head and neck trauma. EXAM: CT HEAD WITHOUT CONTRAST CT CERVICAL SPINE WITHOUT CONTRAST TECHNIQUE: Multidetector CT imaging of the head and cervical spine was performed following the standard protocol without intravenous contrast. Multiplanar CT image reconstructions of the cervical spine were also generated. RADIATION DOSE REDUCTION: This exam was performed according to the departmental dose-optimization program which includes automated exposure control, adjustment of the mA and/or kV according to patient size and/or use of iterative reconstruction technique. COMPARISON:  01/18/2020 FINDINGS: CT HEAD FINDINGS Brain: No evidence of acute infarction, hemorrhage, extra-axial collection, ventriculomegaly, or mass effect. Generalized cerebral atrophy. Periventricular white matter low attenuation likely secondary to microangiopathy. Vascular: Cerebrovascular atherosclerotic calcifications are noted. No hyperdense vessels. Skull: Negative for fracture or focal lesion. Sinuses/Orbits: Visualized portions of the orbits are unremarkable. Visualized portions of the paranasal sinuses are unremarkable. Visualized portions of the mastoid air cells are unremarkable. Other: None. CT CERVICAL SPINE FINDINGS Alignment: Normal. Skull base and vertebrae: No acute fracture. No primary bone lesion or focal pathologic process. Soft tissues and spinal canal: No prevertebral fluid or swelling. No visible canal hematoma. Disc levels: Degenerative disease with disc height loss at C3-4, C4-5, C5-6, and C6-7. Bilateral facet arthropathy throughout the cervical spine. Calcified central disc protrusion at C2-3. Right uncovertebral degenerative changes at C4-5 with foraminal stenosis. Bilateral uncovertebral degenerative changes at C5-6 with foraminal  stenosis. Bilateral uncovertebral degenerative changes at C6-7 with left foraminal stenosis. Upper chest: Lung apices are clear. Other: No fluid collection or hematoma. Bilateral carotid artery atherosclerosis. IMPRESSION: 1. No acute intracranial pathology. 2. No acute fracture or subluxation of the cervical spine. 3. Cervical spine spondylosis as described above. Electronically Signed   By: Kathreen Devoid M.D.   On: 11/15/2021 11:31   CT Cervical Spine Wo Contrast  Result Date: 11/15/2021 CLINICAL DATA:  Head and neck trauma. EXAM: CT HEAD WITHOUT CONTRAST CT CERVICAL SPINE WITHOUT CONTRAST TECHNIQUE: Multidetector CT imaging of the head and cervical spine was performed following the standard protocol without intravenous contrast. Multiplanar CT image reconstructions of the cervical spine were also generated. RADIATION DOSE REDUCTION: This exam was performed according to the departmental dose-optimization program which includes automated exposure control, adjustment of the mA and/or kV according to patient size and/or use of iterative reconstruction technique. COMPARISON:  01/18/2020  FINDINGS: CT HEAD FINDINGS Brain: No evidence of acute infarction, hemorrhage, extra-axial collection, ventriculomegaly, or mass effect. Generalized cerebral atrophy. Periventricular white matter low attenuation likely secondary to microangiopathy. Vascular: Cerebrovascular atherosclerotic calcifications are noted. No hyperdense vessels. Skull: Negative for fracture or focal lesion. Sinuses/Orbits: Visualized portions of the orbits are unremarkable. Visualized portions of the paranasal sinuses are unremarkable. Visualized portions of the mastoid air cells are unremarkable. Other: None. CT CERVICAL SPINE FINDINGS Alignment: Normal. Skull base and vertebrae: No acute fracture. No primary bone lesion or focal pathologic process. Soft tissues and spinal canal: No prevertebral fluid or swelling. No visible canal hematoma. Disc levels:  Degenerative disease with disc height loss at C3-4, C4-5, C5-6, and C6-7. Bilateral facet arthropathy throughout the cervical spine. Calcified central disc protrusion at C2-3. Right uncovertebral degenerative changes at C4-5 with foraminal stenosis. Bilateral uncovertebral degenerative changes at C5-6 with foraminal stenosis. Bilateral uncovertebral degenerative changes at C6-7 with left foraminal stenosis. Upper chest: Lung apices are clear. Other: No fluid collection or hematoma. Bilateral carotid artery atherosclerosis. IMPRESSION: 1. No acute intracranial pathology. 2. No acute fracture or subluxation of the cervical spine. 3. Cervical spine spondylosis as described above. Electronically Signed   By: Kathreen Devoid M.D.   On: 11/15/2021 11:31    Procedures .Marland KitchenLaceration Repair  Date/Time: 11/15/2021 4:00 PM  Performed by: Tacy Learn, PA-C Authorized by: Tacy Learn, PA-C   Consent:    Consent obtained:  Verbal   Consent given by:  Patient   Risks discussed:  Infection, need for additional repair, pain, poor cosmetic result and poor wound healing   Alternatives discussed:  No treatment and delayed treatment Universal protocol:    Procedure explained and questions answered to patient or proxy's satisfaction: yes     Relevant documents present and verified: yes     Test results available: yes     Imaging studies available: yes     Required blood products, implants, devices, and special equipment available: yes     Site/side marked: yes     Immediately prior to procedure, a time out was called: yes     Patient identity confirmed:  Verbally with patient Anesthesia:    Anesthesia method:  Local infiltration and topical application   Topical anesthetic:  LET   Local anesthetic:  Lidocaine 2% WITH epi Laceration details:    Location:  Scalp   Scalp location:  L parietal   Length (cm):  2   Depth (mm):  4 Pre-procedure details:    Preparation:  Patient was prepped and draped in usual  sterile fashion and imaging obtained to evaluate for foreign bodies Exploration:    Hemostasis achieved with:  LET and epinephrine   Imaging outcome: foreign body not noted     Wound exploration: wound explored through full range of motion and entire depth of wound visualized     Wound extent: underlying fracture     Contaminated: no   Treatment:    Area cleansed with:  Saline   Amount of cleaning:  Standard   Irrigation solution:  Sterile saline Skin repair:    Repair method:  Staples   Number of staples:  2 Approximation:    Approximation:  Loose Repair type:    Repair type:  Simple Post-procedure details:    Dressing:  Open (no dressing)   Procedure completion:  Tolerated well, no immediate complications     Medications Ordered in ED Medications  lidocaine-EPINEPHrine (XYLOCAINE W/EPI) 2 %-1:200000 (PF) injection 10 mL (  10 mLs Infiltration Given 11/15/21 1453)  lidocaine-EPINEPHrine-tetracaine (LET) topical gel (3 mLs Topical Given 11/15/21 1454)    ED Course/ Medical Decision Making/ A&P                           Medical Decision Making Amount and/or Complexity of Data Reviewed Radiology: ordered.  Risk Prescription drug management.   This patient presents to the ED for concern of fall, this involves an extensive number of treatment options, and is a complaint that carries with it a high risk of complications and morbidity.  The differential diagnosis includes but not limited to intracranial injury, skull fracture, c-spine injury, laceration   Co morbidities that complicate the patient evaluation  Dementia, chron's disease, osteoporosis, HTN, CHF   Additional history obtained:  Additional history obtained from EMS, reports facility heard patient fall, was found alert and at baseline mental status. Daughter at bedside, reports patient to be at baseline mental status  Imaging Studies ordered:  I ordered imaging studies including CT head,. CT c-spine  I  independently visualized and interpreted imaging which showed no acute findings  I agree with the radiologist interpretation  Problem List / ED Course / Critical interventions / Medication management  88 year old female with history of dementia brought in by EMS from facility after facility heard patient fall and found her on the ground alert at baseline.  Patient is found to have a small laceration to her left posterior parietal scalp without other injuries, c-collar placed for precaution.  CT head and C-spine obtained and are without acute injury.  Area is anesthetized, cleaned, closed with 2 staples.  Patient's son, Elon Alas, [place], updated with results, treatment and dispo back to facility. I ordered medication including let, lidocaine with epi for wound closure Reevaluation of the patient after these medicines showed that the patient stayed the same I have reviewed the patients home medicines and have made adjustments as needed   Social Determinants of Health:  Dementia, lives at facility   Test / Admission - Considered:  Labs considered however as patient is at baseline mental status with fall without LOC, not felt necessary at this time.         Final Clinical Impression(s) / ED Diagnoses Final diagnoses:  Fall, initial encounter  Scalp laceration, initial encounter    Rx / DC Orders ED Discharge Orders     None         Tacy Learn, PA-C 11/15/21 1642    Isla Pence, MD 11/16/21 [phone]

## 2021-11-18 DIAGNOSIS — Z9181 History of falling: Secondary | ICD-10-CM | POA: Diagnosis not present

## 2021-11-18 DIAGNOSIS — S2241XD Multiple fractures of ribs, right side, subsequent encounter for fracture with routine healing: Secondary | ICD-10-CM | POA: Diagnosis not present

## 2021-11-18 DIAGNOSIS — Z992 Dependence on renal dialysis: Secondary | ICD-10-CM | POA: Diagnosis not present

## 2021-11-18 DIAGNOSIS — R262 Difficulty in walking, not elsewhere classified: Secondary | ICD-10-CM | POA: Diagnosis not present

## 2021-11-18 DIAGNOSIS — M6281 Muscle weakness (generalized): Secondary | ICD-10-CM | POA: Diagnosis not present

## 2021-11-18 DIAGNOSIS — U071 COVID-19: Secondary | ICD-10-CM | POA: Diagnosis not present

## 2021-11-19 DIAGNOSIS — U071 COVID-19: Secondary | ICD-10-CM | POA: Diagnosis not present

## 2021-11-19 DIAGNOSIS — S2241XD Multiple fractures of ribs, right side, subsequent encounter for fracture with routine healing: Secondary | ICD-10-CM | POA: Diagnosis not present

## 2021-11-19 DIAGNOSIS — Z9181 History of falling: Secondary | ICD-10-CM | POA: Diagnosis not present

## 2021-11-19 DIAGNOSIS — M6281 Muscle weakness (generalized): Secondary | ICD-10-CM | POA: Diagnosis not present

## 2021-11-19 DIAGNOSIS — R262 Difficulty in walking, not elsewhere classified: Secondary | ICD-10-CM | POA: Diagnosis not present

## 2021-11-19 DIAGNOSIS — Z992 Dependence on renal dialysis: Secondary | ICD-10-CM | POA: Diagnosis not present

## 2021-11-20 DIAGNOSIS — U071 COVID-19: Secondary | ICD-10-CM | POA: Diagnosis not present

## 2021-11-20 DIAGNOSIS — Z992 Dependence on renal dialysis: Secondary | ICD-10-CM | POA: Diagnosis not present

## 2021-11-20 DIAGNOSIS — R262 Difficulty in walking, not elsewhere classified: Secondary | ICD-10-CM | POA: Diagnosis not present

## 2021-11-20 DIAGNOSIS — M6281 Muscle weakness (generalized): Secondary | ICD-10-CM | POA: Diagnosis not present

## 2021-11-20 DIAGNOSIS — Z9181 History of falling: Secondary | ICD-10-CM | POA: Diagnosis not present

## 2021-11-20 DIAGNOSIS — S2241XD Multiple fractures of ribs, right side, subsequent encounter for fracture with routine healing: Secondary | ICD-10-CM | POA: Diagnosis not present

## 2021-11-21 DIAGNOSIS — S2241XD Multiple fractures of ribs, right side, subsequent encounter for fracture with routine healing: Secondary | ICD-10-CM | POA: Diagnosis not present

## 2021-11-21 DIAGNOSIS — U071 COVID-19: Secondary | ICD-10-CM | POA: Diagnosis not present

## 2021-11-21 DIAGNOSIS — M6281 Muscle weakness (generalized): Secondary | ICD-10-CM | POA: Diagnosis not present

## 2021-11-21 DIAGNOSIS — R262 Difficulty in walking, not elsewhere classified: Secondary | ICD-10-CM | POA: Diagnosis not present

## 2021-11-21 DIAGNOSIS — R69 Illness, unspecified: Secondary | ICD-10-CM | POA: Diagnosis not present

## 2021-11-21 DIAGNOSIS — Z9181 History of falling: Secondary | ICD-10-CM | POA: Diagnosis not present

## 2021-11-21 DIAGNOSIS — F321 Major depressive disorder, single episode, moderate: Secondary | ICD-10-CM | POA: Diagnosis not present

## 2021-11-21 DIAGNOSIS — Z992 Dependence on renal dialysis: Secondary | ICD-10-CM | POA: Diagnosis not present

## 2021-11-22 DIAGNOSIS — S2241XD Multiple fractures of ribs, right side, subsequent encounter for fracture with routine healing: Secondary | ICD-10-CM | POA: Diagnosis not present

## 2021-11-22 DIAGNOSIS — U071 COVID-19: Secondary | ICD-10-CM | POA: Diagnosis not present

## 2021-11-22 DIAGNOSIS — R262 Difficulty in walking, not elsewhere classified: Secondary | ICD-10-CM | POA: Diagnosis not present

## 2021-11-22 DIAGNOSIS — M6281 Muscle weakness (generalized): Secondary | ICD-10-CM | POA: Diagnosis not present

## 2021-11-22 DIAGNOSIS — Z9181 History of falling: Secondary | ICD-10-CM | POA: Diagnosis not present

## 2021-11-22 DIAGNOSIS — Z992 Dependence on renal dialysis: Secondary | ICD-10-CM | POA: Diagnosis not present

## 2021-11-26 DIAGNOSIS — M6281 Muscle weakness (generalized): Secondary | ICD-10-CM | POA: Diagnosis not present

## 2021-11-26 DIAGNOSIS — S2241XD Multiple fractures of ribs, right side, subsequent encounter for fracture with routine healing: Secondary | ICD-10-CM | POA: Diagnosis not present

## 2021-11-26 DIAGNOSIS — R262 Difficulty in walking, not elsewhere classified: Secondary | ICD-10-CM | POA: Diagnosis not present

## 2021-11-26 DIAGNOSIS — U071 COVID-19: Secondary | ICD-10-CM | POA: Diagnosis not present

## 2021-11-26 DIAGNOSIS — Z992 Dependence on renal dialysis: Secondary | ICD-10-CM | POA: Diagnosis not present

## 2021-11-26 DIAGNOSIS — Z9181 History of falling: Secondary | ICD-10-CM | POA: Diagnosis not present

## 2021-11-27 DIAGNOSIS — M6281 Muscle weakness (generalized): Secondary | ICD-10-CM | POA: Diagnosis not present

## 2021-11-27 DIAGNOSIS — Z992 Dependence on renal dialysis: Secondary | ICD-10-CM | POA: Diagnosis not present

## 2021-11-27 DIAGNOSIS — U071 COVID-19: Secondary | ICD-10-CM | POA: Diagnosis not present

## 2021-11-27 DIAGNOSIS — F321 Major depressive disorder, single episode, moderate: Secondary | ICD-10-CM | POA: Diagnosis not present

## 2021-11-27 DIAGNOSIS — R262 Difficulty in walking, not elsewhere classified: Secondary | ICD-10-CM | POA: Diagnosis not present

## 2021-11-27 DIAGNOSIS — Z9181 History of falling: Secondary | ICD-10-CM | POA: Diagnosis not present

## 2021-11-27 DIAGNOSIS — S2241XD Multiple fractures of ribs, right side, subsequent encounter for fracture with routine healing: Secondary | ICD-10-CM | POA: Diagnosis not present

## 2021-11-27 DIAGNOSIS — R69 Illness, unspecified: Secondary | ICD-10-CM | POA: Diagnosis not present

## 2021-11-28 DIAGNOSIS — M6281 Muscle weakness (generalized): Secondary | ICD-10-CM | POA: Diagnosis not present

## 2021-11-28 DIAGNOSIS — Z9181 History of falling: Secondary | ICD-10-CM | POA: Diagnosis not present

## 2021-11-28 DIAGNOSIS — Z992 Dependence on renal dialysis: Secondary | ICD-10-CM | POA: Diagnosis not present

## 2021-11-28 DIAGNOSIS — S2241XD Multiple fractures of ribs, right side, subsequent encounter for fracture with routine healing: Secondary | ICD-10-CM | POA: Diagnosis not present

## 2021-11-28 DIAGNOSIS — R262 Difficulty in walking, not elsewhere classified: Secondary | ICD-10-CM | POA: Diagnosis not present

## 2021-11-28 DIAGNOSIS — B351 Tinea unguium: Secondary | ICD-10-CM | POA: Diagnosis not present

## 2021-11-28 DIAGNOSIS — I7091 Generalized atherosclerosis: Secondary | ICD-10-CM | POA: Diagnosis not present

## 2021-11-28 DIAGNOSIS — U071 COVID-19: Secondary | ICD-10-CM | POA: Diagnosis not present

## 2021-11-29 DIAGNOSIS — S2241XD Multiple fractures of ribs, right side, subsequent encounter for fracture with routine healing: Secondary | ICD-10-CM | POA: Diagnosis not present

## 2021-11-29 DIAGNOSIS — M6281 Muscle weakness (generalized): Secondary | ICD-10-CM | POA: Diagnosis not present

## 2021-11-29 DIAGNOSIS — Z992 Dependence on renal dialysis: Secondary | ICD-10-CM | POA: Diagnosis not present

## 2021-11-29 DIAGNOSIS — R262 Difficulty in walking, not elsewhere classified: Secondary | ICD-10-CM | POA: Diagnosis not present

## 2021-11-29 DIAGNOSIS — U071 COVID-19: Secondary | ICD-10-CM | POA: Diagnosis not present

## 2021-11-29 DIAGNOSIS — Z9181 History of falling: Secondary | ICD-10-CM | POA: Diagnosis not present

## 2021-12-01 DIAGNOSIS — U071 COVID-19: Secondary | ICD-10-CM | POA: Diagnosis not present

## 2021-12-01 DIAGNOSIS — Z9181 History of falling: Secondary | ICD-10-CM | POA: Diagnosis not present

## 2021-12-01 DIAGNOSIS — R262 Difficulty in walking, not elsewhere classified: Secondary | ICD-10-CM | POA: Diagnosis not present

## 2021-12-01 DIAGNOSIS — Z992 Dependence on renal dialysis: Secondary | ICD-10-CM | POA: Diagnosis not present

## 2021-12-01 DIAGNOSIS — M6281 Muscle weakness (generalized): Secondary | ICD-10-CM | POA: Diagnosis not present

## 2021-12-01 DIAGNOSIS — S2241XD Multiple fractures of ribs, right side, subsequent encounter for fracture with routine healing: Secondary | ICD-10-CM | POA: Diagnosis not present

## 2021-12-03 DIAGNOSIS — Z992 Dependence on renal dialysis: Secondary | ICD-10-CM | POA: Diagnosis not present

## 2021-12-03 DIAGNOSIS — R262 Difficulty in walking, not elsewhere classified: Secondary | ICD-10-CM | POA: Diagnosis not present

## 2021-12-03 DIAGNOSIS — M6281 Muscle weakness (generalized): Secondary | ICD-10-CM | POA: Diagnosis not present

## 2021-12-03 DIAGNOSIS — U071 COVID-19: Secondary | ICD-10-CM | POA: Diagnosis not present

## 2021-12-03 DIAGNOSIS — S2241XD Multiple fractures of ribs, right side, subsequent encounter for fracture with routine healing: Secondary | ICD-10-CM | POA: Diagnosis not present

## 2021-12-03 DIAGNOSIS — Z9181 History of falling: Secondary | ICD-10-CM | POA: Diagnosis not present

## 2021-12-04 DIAGNOSIS — Z9181 History of falling: Secondary | ICD-10-CM | POA: Diagnosis not present

## 2021-12-04 DIAGNOSIS — Z992 Dependence on renal dialysis: Secondary | ICD-10-CM | POA: Diagnosis not present

## 2021-12-04 DIAGNOSIS — S2241XD Multiple fractures of ribs, right side, subsequent encounter for fracture with routine healing: Secondary | ICD-10-CM | POA: Diagnosis not present

## 2021-12-04 DIAGNOSIS — U071 COVID-19: Secondary | ICD-10-CM | POA: Diagnosis not present

## 2021-12-04 DIAGNOSIS — R262 Difficulty in walking, not elsewhere classified: Secondary | ICD-10-CM | POA: Diagnosis not present

## 2021-12-04 DIAGNOSIS — M6281 Muscle weakness (generalized): Secondary | ICD-10-CM | POA: Diagnosis not present

## 2021-12-05 DIAGNOSIS — Z992 Dependence on renal dialysis: Secondary | ICD-10-CM | POA: Diagnosis not present

## 2021-12-05 DIAGNOSIS — Z9181 History of falling: Secondary | ICD-10-CM | POA: Diagnosis not present

## 2021-12-05 DIAGNOSIS — F321 Major depressive disorder, single episode, moderate: Secondary | ICD-10-CM | POA: Diagnosis not present

## 2021-12-05 DIAGNOSIS — R262 Difficulty in walking, not elsewhere classified: Secondary | ICD-10-CM | POA: Diagnosis not present

## 2021-12-05 DIAGNOSIS — M6281 Muscle weakness (generalized): Secondary | ICD-10-CM | POA: Diagnosis not present

## 2021-12-05 DIAGNOSIS — U071 COVID-19: Secondary | ICD-10-CM | POA: Diagnosis not present

## 2021-12-05 DIAGNOSIS — S2241XD Multiple fractures of ribs, right side, subsequent encounter for fracture with routine healing: Secondary | ICD-10-CM | POA: Diagnosis not present

## 2021-12-05 DIAGNOSIS — R69 Illness, unspecified: Secondary | ICD-10-CM | POA: Diagnosis not present

## 2021-12-06 DIAGNOSIS — Z992 Dependence on renal dialysis: Secondary | ICD-10-CM | POA: Diagnosis not present

## 2021-12-06 DIAGNOSIS — M6281 Muscle weakness (generalized): Secondary | ICD-10-CM | POA: Diagnosis not present

## 2021-12-06 DIAGNOSIS — U071 COVID-19: Secondary | ICD-10-CM | POA: Diagnosis not present

## 2021-12-06 DIAGNOSIS — S2241XD Multiple fractures of ribs, right side, subsequent encounter for fracture with routine healing: Secondary | ICD-10-CM | POA: Diagnosis not present

## 2021-12-06 DIAGNOSIS — R262 Difficulty in walking, not elsewhere classified: Secondary | ICD-10-CM | POA: Diagnosis not present

## 2021-12-06 DIAGNOSIS — Z9181 History of falling: Secondary | ICD-10-CM | POA: Diagnosis not present

## 2021-12-07 DIAGNOSIS — S2241XD Multiple fractures of ribs, right side, subsequent encounter for fracture with routine healing: Secondary | ICD-10-CM | POA: Diagnosis not present

## 2021-12-07 DIAGNOSIS — M6281 Muscle weakness (generalized): Secondary | ICD-10-CM | POA: Diagnosis not present

## 2021-12-07 DIAGNOSIS — Z992 Dependence on renal dialysis: Secondary | ICD-10-CM | POA: Diagnosis not present

## 2021-12-07 DIAGNOSIS — U071 COVID-19: Secondary | ICD-10-CM | POA: Diagnosis not present

## 2021-12-07 DIAGNOSIS — R262 Difficulty in walking, not elsewhere classified: Secondary | ICD-10-CM | POA: Diagnosis not present

## 2021-12-07 DIAGNOSIS — Z9181 History of falling: Secondary | ICD-10-CM | POA: Diagnosis not present

## 2021-12-10 DIAGNOSIS — R69 Illness, unspecified: Secondary | ICD-10-CM | POA: Diagnosis not present

## 2021-12-10 DIAGNOSIS — Z9181 History of falling: Secondary | ICD-10-CM | POA: Diagnosis not present

## 2021-12-10 DIAGNOSIS — U071 COVID-19: Secondary | ICD-10-CM | POA: Diagnosis not present

## 2021-12-10 DIAGNOSIS — S2241XD Multiple fractures of ribs, right side, subsequent encounter for fracture with routine healing: Secondary | ICD-10-CM | POA: Diagnosis not present

## 2021-12-10 DIAGNOSIS — F329 Major depressive disorder, single episode, unspecified: Secondary | ICD-10-CM | POA: Diagnosis not present

## 2021-12-10 DIAGNOSIS — R262 Difficulty in walking, not elsewhere classified: Secondary | ICD-10-CM | POA: Diagnosis not present

## 2021-12-10 DIAGNOSIS — M6281 Muscle weakness (generalized): Secondary | ICD-10-CM | POA: Diagnosis not present

## 2021-12-10 DIAGNOSIS — Z992 Dependence on renal dialysis: Secondary | ICD-10-CM | POA: Diagnosis not present

## 2021-12-11 DIAGNOSIS — R262 Difficulty in walking, not elsewhere classified: Secondary | ICD-10-CM | POA: Diagnosis not present

## 2021-12-11 DIAGNOSIS — Z9181 History of falling: Secondary | ICD-10-CM | POA: Diagnosis not present

## 2021-12-11 DIAGNOSIS — M6281 Muscle weakness (generalized): Secondary | ICD-10-CM | POA: Diagnosis not present

## 2021-12-11 DIAGNOSIS — U071 COVID-19: Secondary | ICD-10-CM | POA: Diagnosis not present

## 2021-12-11 DIAGNOSIS — Z992 Dependence on renal dialysis: Secondary | ICD-10-CM | POA: Diagnosis not present

## 2021-12-11 DIAGNOSIS — S2241XD Multiple fractures of ribs, right side, subsequent encounter for fracture with routine healing: Secondary | ICD-10-CM | POA: Diagnosis not present

## 2021-12-12 DIAGNOSIS — R69 Illness, unspecified: Secondary | ICD-10-CM | POA: Diagnosis not present

## 2021-12-12 DIAGNOSIS — R262 Difficulty in walking, not elsewhere classified: Secondary | ICD-10-CM | POA: Diagnosis not present

## 2021-12-12 DIAGNOSIS — Z992 Dependence on renal dialysis: Secondary | ICD-10-CM | POA: Diagnosis not present

## 2021-12-12 DIAGNOSIS — U071 COVID-19: Secondary | ICD-10-CM | POA: Diagnosis not present

## 2021-12-12 DIAGNOSIS — I5032 Chronic diastolic (congestive) heart failure: Secondary | ICD-10-CM | POA: Diagnosis not present

## 2021-12-12 DIAGNOSIS — Z9181 History of falling: Secondary | ICD-10-CM | POA: Diagnosis not present

## 2021-12-12 DIAGNOSIS — M6281 Muscle weakness (generalized): Secondary | ICD-10-CM | POA: Diagnosis not present

## 2021-12-12 DIAGNOSIS — S2241XD Multiple fractures of ribs, right side, subsequent encounter for fracture with routine healing: Secondary | ICD-10-CM | POA: Diagnosis not present

## 2021-12-13 DIAGNOSIS — F321 Major depressive disorder, single episode, moderate: Secondary | ICD-10-CM | POA: Diagnosis not present

## 2021-12-13 DIAGNOSIS — R262 Difficulty in walking, not elsewhere classified: Secondary | ICD-10-CM | POA: Diagnosis not present

## 2021-12-13 DIAGNOSIS — U071 COVID-19: Secondary | ICD-10-CM | POA: Diagnosis not present

## 2021-12-13 DIAGNOSIS — Z992 Dependence on renal dialysis: Secondary | ICD-10-CM | POA: Diagnosis not present

## 2021-12-13 DIAGNOSIS — M6281 Muscle weakness (generalized): Secondary | ICD-10-CM | POA: Diagnosis not present

## 2021-12-13 DIAGNOSIS — S2241XD Multiple fractures of ribs, right side, subsequent encounter for fracture with routine healing: Secondary | ICD-10-CM | POA: Diagnosis not present

## 2021-12-13 DIAGNOSIS — R69 Illness, unspecified: Secondary | ICD-10-CM | POA: Diagnosis not present

## 2021-12-13 DIAGNOSIS — Z9181 History of falling: Secondary | ICD-10-CM | POA: Diagnosis not present

## 2021-12-14 DIAGNOSIS — U071 COVID-19: Secondary | ICD-10-CM | POA: Diagnosis not present

## 2021-12-14 DIAGNOSIS — S2241XD Multiple fractures of ribs, right side, subsequent encounter for fracture with routine healing: Secondary | ICD-10-CM | POA: Diagnosis not present

## 2021-12-14 DIAGNOSIS — Z9181 History of falling: Secondary | ICD-10-CM | POA: Diagnosis not present

## 2021-12-14 DIAGNOSIS — Z992 Dependence on renal dialysis: Secondary | ICD-10-CM | POA: Diagnosis not present

## 2021-12-14 DIAGNOSIS — R262 Difficulty in walking, not elsewhere classified: Secondary | ICD-10-CM | POA: Diagnosis not present

## 2021-12-14 DIAGNOSIS — M6281 Muscle weakness (generalized): Secondary | ICD-10-CM | POA: Diagnosis not present

## 2021-12-17 DIAGNOSIS — M6281 Muscle weakness (generalized): Secondary | ICD-10-CM | POA: Diagnosis not present

## 2021-12-17 DIAGNOSIS — S2241XD Multiple fractures of ribs, right side, subsequent encounter for fracture with routine healing: Secondary | ICD-10-CM | POA: Diagnosis not present

## 2021-12-17 DIAGNOSIS — Z9181 History of falling: Secondary | ICD-10-CM | POA: Diagnosis not present

## 2021-12-17 DIAGNOSIS — R262 Difficulty in walking, not elsewhere classified: Secondary | ICD-10-CM | POA: Diagnosis not present

## 2021-12-17 DIAGNOSIS — Z992 Dependence on renal dialysis: Secondary | ICD-10-CM | POA: Diagnosis not present

## 2021-12-17 DIAGNOSIS — U071 COVID-19: Secondary | ICD-10-CM | POA: Diagnosis not present

## 2021-12-18 DIAGNOSIS — U071 COVID-19: Secondary | ICD-10-CM | POA: Diagnosis not present

## 2021-12-18 DIAGNOSIS — S2241XD Multiple fractures of ribs, right side, subsequent encounter for fracture with routine healing: Secondary | ICD-10-CM | POA: Diagnosis not present

## 2021-12-18 DIAGNOSIS — M6281 Muscle weakness (generalized): Secondary | ICD-10-CM | POA: Diagnosis not present

## 2021-12-18 DIAGNOSIS — Z9181 History of falling: Secondary | ICD-10-CM | POA: Diagnosis not present

## 2021-12-18 DIAGNOSIS — R262 Difficulty in walking, not elsewhere classified: Secondary | ICD-10-CM | POA: Diagnosis not present

## 2021-12-18 DIAGNOSIS — Z992 Dependence on renal dialysis: Secondary | ICD-10-CM | POA: Diagnosis not present

## 2021-12-19 DIAGNOSIS — Z992 Dependence on renal dialysis: Secondary | ICD-10-CM | POA: Diagnosis not present

## 2021-12-19 DIAGNOSIS — E440 Moderate protein-calorie malnutrition: Secondary | ICD-10-CM | POA: Diagnosis not present

## 2021-12-19 DIAGNOSIS — U071 COVID-19: Secondary | ICD-10-CM | POA: Diagnosis not present

## 2021-12-19 DIAGNOSIS — F0390 Unspecified dementia without behavioral disturbance: Secondary | ICD-10-CM | POA: Diagnosis not present

## 2021-12-19 DIAGNOSIS — R69 Illness, unspecified: Secondary | ICD-10-CM | POA: Diagnosis not present

## 2021-12-19 DIAGNOSIS — F329 Major depressive disorder, single episode, unspecified: Secondary | ICD-10-CM | POA: Diagnosis not present

## 2021-12-19 DIAGNOSIS — R262 Difficulty in walking, not elsewhere classified: Secondary | ICD-10-CM | POA: Diagnosis not present

## 2021-12-19 DIAGNOSIS — M810 Age-related osteoporosis without current pathological fracture: Secondary | ICD-10-CM | POA: Diagnosis not present

## 2021-12-19 DIAGNOSIS — Z9181 History of falling: Secondary | ICD-10-CM | POA: Diagnosis not present

## 2021-12-19 DIAGNOSIS — I5032 Chronic diastolic (congestive) heart failure: Secondary | ICD-10-CM | POA: Diagnosis not present

## 2021-12-19 DIAGNOSIS — M6281 Muscle weakness (generalized): Secondary | ICD-10-CM | POA: Diagnosis not present

## 2021-12-19 DIAGNOSIS — S2241XD Multiple fractures of ribs, right side, subsequent encounter for fracture with routine healing: Secondary | ICD-10-CM | POA: Diagnosis not present

## 2021-12-20 DIAGNOSIS — Z9181 History of falling: Secondary | ICD-10-CM | POA: Diagnosis not present

## 2021-12-20 DIAGNOSIS — Z992 Dependence on renal dialysis: Secondary | ICD-10-CM | POA: Diagnosis not present

## 2021-12-20 DIAGNOSIS — U071 COVID-19: Secondary | ICD-10-CM | POA: Diagnosis not present

## 2021-12-20 DIAGNOSIS — M6281 Muscle weakness (generalized): Secondary | ICD-10-CM | POA: Diagnosis not present

## 2021-12-20 DIAGNOSIS — R262 Difficulty in walking, not elsewhere classified: Secondary | ICD-10-CM | POA: Diagnosis not present

## 2021-12-20 DIAGNOSIS — S2241XD Multiple fractures of ribs, right side, subsequent encounter for fracture with routine healing: Secondary | ICD-10-CM | POA: Diagnosis not present

## 2021-12-21 DIAGNOSIS — R262 Difficulty in walking, not elsewhere classified: Secondary | ICD-10-CM | POA: Diagnosis not present

## 2021-12-21 DIAGNOSIS — M6281 Muscle weakness (generalized): Secondary | ICD-10-CM | POA: Diagnosis not present

## 2021-12-21 DIAGNOSIS — U071 COVID-19: Secondary | ICD-10-CM | POA: Diagnosis not present

## 2021-12-21 DIAGNOSIS — Z992 Dependence on renal dialysis: Secondary | ICD-10-CM | POA: Diagnosis not present

## 2021-12-21 DIAGNOSIS — Z9181 History of falling: Secondary | ICD-10-CM | POA: Diagnosis not present

## 2021-12-21 DIAGNOSIS — S2241XD Multiple fractures of ribs, right side, subsequent encounter for fracture with routine healing: Secondary | ICD-10-CM | POA: Diagnosis not present

## 2021-12-24 DIAGNOSIS — F321 Major depressive disorder, single episode, moderate: Secondary | ICD-10-CM | POA: Diagnosis not present

## 2021-12-24 DIAGNOSIS — M6281 Muscle weakness (generalized): Secondary | ICD-10-CM | POA: Diagnosis not present

## 2021-12-24 DIAGNOSIS — S2241XD Multiple fractures of ribs, right side, subsequent encounter for fracture with routine healing: Secondary | ICD-10-CM | POA: Diagnosis not present

## 2021-12-24 DIAGNOSIS — Z992 Dependence on renal dialysis: Secondary | ICD-10-CM | POA: Diagnosis not present

## 2021-12-24 DIAGNOSIS — R262 Difficulty in walking, not elsewhere classified: Secondary | ICD-10-CM | POA: Diagnosis not present

## 2021-12-24 DIAGNOSIS — R69 Illness, unspecified: Secondary | ICD-10-CM | POA: Diagnosis not present

## 2021-12-24 DIAGNOSIS — U071 COVID-19: Secondary | ICD-10-CM | POA: Diagnosis not present

## 2021-12-24 DIAGNOSIS — Z9181 History of falling: Secondary | ICD-10-CM | POA: Diagnosis not present

## 2021-12-25 DIAGNOSIS — Z9181 History of falling: Secondary | ICD-10-CM | POA: Diagnosis not present

## 2021-12-25 DIAGNOSIS — S2241XD Multiple fractures of ribs, right side, subsequent encounter for fracture with routine healing: Secondary | ICD-10-CM | POA: Diagnosis not present

## 2021-12-25 DIAGNOSIS — Z992 Dependence on renal dialysis: Secondary | ICD-10-CM | POA: Diagnosis not present

## 2021-12-25 DIAGNOSIS — R262 Difficulty in walking, not elsewhere classified: Secondary | ICD-10-CM | POA: Diagnosis not present

## 2021-12-25 DIAGNOSIS — M6281 Muscle weakness (generalized): Secondary | ICD-10-CM | POA: Diagnosis not present

## 2021-12-25 DIAGNOSIS — U071 COVID-19: Secondary | ICD-10-CM | POA: Diagnosis not present

## 2021-12-26 DIAGNOSIS — U071 COVID-19: Secondary | ICD-10-CM | POA: Diagnosis not present

## 2021-12-26 DIAGNOSIS — M6281 Muscle weakness (generalized): Secondary | ICD-10-CM | POA: Diagnosis not present

## 2021-12-26 DIAGNOSIS — Z9181 History of falling: Secondary | ICD-10-CM | POA: Diagnosis not present

## 2021-12-26 DIAGNOSIS — S2241XD Multiple fractures of ribs, right side, subsequent encounter for fracture with routine healing: Secondary | ICD-10-CM | POA: Diagnosis not present

## 2021-12-26 DIAGNOSIS — R262 Difficulty in walking, not elsewhere classified: Secondary | ICD-10-CM | POA: Diagnosis not present

## 2021-12-26 DIAGNOSIS — Z992 Dependence on renal dialysis: Secondary | ICD-10-CM | POA: Diagnosis not present

## 2021-12-27 DIAGNOSIS — S2241XD Multiple fractures of ribs, right side, subsequent encounter for fracture with routine healing: Secondary | ICD-10-CM | POA: Diagnosis not present

## 2021-12-27 DIAGNOSIS — M6281 Muscle weakness (generalized): Secondary | ICD-10-CM | POA: Diagnosis not present

## 2021-12-27 DIAGNOSIS — R262 Difficulty in walking, not elsewhere classified: Secondary | ICD-10-CM | POA: Diagnosis not present

## 2021-12-27 DIAGNOSIS — Z992 Dependence on renal dialysis: Secondary | ICD-10-CM | POA: Diagnosis not present

## 2021-12-27 DIAGNOSIS — U071 COVID-19: Secondary | ICD-10-CM | POA: Diagnosis not present

## 2021-12-27 DIAGNOSIS — Z9181 History of falling: Secondary | ICD-10-CM | POA: Diagnosis not present

## 2021-12-29 DIAGNOSIS — U071 COVID-19: Secondary | ICD-10-CM | POA: Diagnosis not present

## 2021-12-29 DIAGNOSIS — Z992 Dependence on renal dialysis: Secondary | ICD-10-CM | POA: Diagnosis not present

## 2021-12-29 DIAGNOSIS — S2241XD Multiple fractures of ribs, right side, subsequent encounter for fracture with routine healing: Secondary | ICD-10-CM | POA: Diagnosis not present

## 2021-12-29 DIAGNOSIS — Z9181 History of falling: Secondary | ICD-10-CM | POA: Diagnosis not present

## 2021-12-29 DIAGNOSIS — M6281 Muscle weakness (generalized): Secondary | ICD-10-CM | POA: Diagnosis not present

## 2021-12-29 DIAGNOSIS — R262 Difficulty in walking, not elsewhere classified: Secondary | ICD-10-CM | POA: Diagnosis not present

## 2021-12-31 DIAGNOSIS — S2241XD Multiple fractures of ribs, right side, subsequent encounter for fracture with routine healing: Secondary | ICD-10-CM | POA: Diagnosis not present

## 2021-12-31 DIAGNOSIS — Z992 Dependence on renal dialysis: Secondary | ICD-10-CM | POA: Diagnosis not present

## 2021-12-31 DIAGNOSIS — U071 COVID-19: Secondary | ICD-10-CM | POA: Diagnosis not present

## 2021-12-31 DIAGNOSIS — M6281 Muscle weakness (generalized): Secondary | ICD-10-CM | POA: Diagnosis not present

## 2021-12-31 DIAGNOSIS — Z9181 History of falling: Secondary | ICD-10-CM | POA: Diagnosis not present

## 2021-12-31 DIAGNOSIS — R262 Difficulty in walking, not elsewhere classified: Secondary | ICD-10-CM | POA: Diagnosis not present

## 2022-01-01 DIAGNOSIS — S2241XD Multiple fractures of ribs, right side, subsequent encounter for fracture with routine healing: Secondary | ICD-10-CM | POA: Diagnosis not present

## 2022-01-01 DIAGNOSIS — U071 COVID-19: Secondary | ICD-10-CM | POA: Diagnosis not present

## 2022-01-01 DIAGNOSIS — Z992 Dependence on renal dialysis: Secondary | ICD-10-CM | POA: Diagnosis not present

## 2022-01-01 DIAGNOSIS — F321 Major depressive disorder, single episode, moderate: Secondary | ICD-10-CM | POA: Diagnosis not present

## 2022-01-01 DIAGNOSIS — M6281 Muscle weakness (generalized): Secondary | ICD-10-CM | POA: Diagnosis not present

## 2022-01-01 DIAGNOSIS — Z9181 History of falling: Secondary | ICD-10-CM | POA: Diagnosis not present

## 2022-01-01 DIAGNOSIS — R69 Illness, unspecified: Secondary | ICD-10-CM | POA: Diagnosis not present

## 2022-01-01 DIAGNOSIS — R262 Difficulty in walking, not elsewhere classified: Secondary | ICD-10-CM | POA: Diagnosis not present

## 2022-01-02 DIAGNOSIS — Z992 Dependence on renal dialysis: Secondary | ICD-10-CM | POA: Diagnosis not present

## 2022-01-02 DIAGNOSIS — U071 COVID-19: Secondary | ICD-10-CM | POA: Diagnosis not present

## 2022-01-02 DIAGNOSIS — S2241XD Multiple fractures of ribs, right side, subsequent encounter for fracture with routine healing: Secondary | ICD-10-CM | POA: Diagnosis not present

## 2022-01-02 DIAGNOSIS — M6281 Muscle weakness (generalized): Secondary | ICD-10-CM | POA: Diagnosis not present

## 2022-01-02 DIAGNOSIS — Z9181 History of falling: Secondary | ICD-10-CM | POA: Diagnosis not present

## 2022-01-02 DIAGNOSIS — R262 Difficulty in walking, not elsewhere classified: Secondary | ICD-10-CM | POA: Diagnosis not present

## 2022-01-03 DIAGNOSIS — M6281 Muscle weakness (generalized): Secondary | ICD-10-CM | POA: Diagnosis not present

## 2022-01-03 DIAGNOSIS — U071 COVID-19: Secondary | ICD-10-CM | POA: Diagnosis not present

## 2022-01-03 DIAGNOSIS — Z9181 History of falling: Secondary | ICD-10-CM | POA: Diagnosis not present

## 2022-01-03 DIAGNOSIS — S2241XD Multiple fractures of ribs, right side, subsequent encounter for fracture with routine healing: Secondary | ICD-10-CM | POA: Diagnosis not present

## 2022-01-03 DIAGNOSIS — R262 Difficulty in walking, not elsewhere classified: Secondary | ICD-10-CM | POA: Diagnosis not present

## 2022-01-03 DIAGNOSIS — Z992 Dependence on renal dialysis: Secondary | ICD-10-CM | POA: Diagnosis not present

## 2022-01-05 ENCOUNTER — Emergency Department (HOSPITAL_COMMUNITY): Payer: Medicare HMO

## 2022-01-05 ENCOUNTER — Emergency Department (HOSPITAL_COMMUNITY)
Admission: EM | Admit: 2022-01-05 | Discharge: 2022-01-06 | Disposition: A | Discharge status: Skilled Nursing Facility | Payer: Medicare HMO | Attending: Emergency Medicine | Admitting: Emergency Medicine

## 2022-01-05 ENCOUNTER — Other Ambulatory Visit: Payer: Self-pay

## 2022-01-05 DIAGNOSIS — R072 Precordial pain: Secondary | ICD-10-CM | POA: Diagnosis not present

## 2022-01-05 DIAGNOSIS — I10 Essential (primary) hypertension: Secondary | ICD-10-CM | POA: Diagnosis not present

## 2022-01-05 DIAGNOSIS — Z9181 History of falling: Secondary | ICD-10-CM | POA: Diagnosis not present

## 2022-01-05 DIAGNOSIS — R69 Illness, unspecified: Secondary | ICD-10-CM | POA: Diagnosis not present

## 2022-01-05 DIAGNOSIS — U071 COVID-19: Secondary | ICD-10-CM | POA: Diagnosis not present

## 2022-01-05 DIAGNOSIS — Z743 Need for continuous supervision: Secondary | ICD-10-CM | POA: Diagnosis not present

## 2022-01-05 DIAGNOSIS — R079 Chest pain, unspecified: Secondary | ICD-10-CM | POA: Diagnosis not present

## 2022-01-05 DIAGNOSIS — S2241XD Multiple fractures of ribs, right side, subsequent encounter for fracture with routine healing: Secondary | ICD-10-CM | POA: Diagnosis not present

## 2022-01-05 DIAGNOSIS — F0390 Unspecified dementia without behavioral disturbance: Secondary | ICD-10-CM | POA: Diagnosis not present

## 2022-01-05 DIAGNOSIS — M6281 Muscle weakness (generalized): Secondary | ICD-10-CM | POA: Diagnosis not present

## 2022-01-05 DIAGNOSIS — M79601 Pain in right arm: Secondary | ICD-10-CM | POA: Insufficient documentation

## 2022-01-05 DIAGNOSIS — R262 Difficulty in walking, not elsewhere classified: Secondary | ICD-10-CM | POA: Diagnosis not present

## 2022-01-05 DIAGNOSIS — Z992 Dependence on renal dialysis: Secondary | ICD-10-CM | POA: Diagnosis not present

## 2022-01-05 DIAGNOSIS — N644 Mastodynia: Secondary | ICD-10-CM | POA: Diagnosis not present

## 2022-01-05 LAB — CBC
HCT: 43.3 % (ref 36.0–46.0)
Hemoglobin: 14.5 g/dL (ref 12.0–15.0)
MCH: 31.3 pg (ref 26.0–34.0)
MCHC: 33.5 g/dL (ref 30.0–36.0)
MCV: 93.3 fL (ref 80.0–100.0)
Platelets: 275 10*3/uL (ref 150–400)
RBC: 4.64 MIL/uL (ref 3.87–5.11)
RDW: 12.9 % (ref 11.5–15.5)
WBC: 9.7 10*3/uL (ref 4.0–10.5)
nRBC: 0 % (ref 0.0–0.2)

## 2022-01-05 LAB — BASIC METABOLIC PANEL
Anion gap: 4 — ABNORMAL LOW (ref 5–15)
BUN: 18 mg/dL (ref 8–23)
CO2: 24 mmol/L (ref 22–32)
Calcium: 9.2 mg/dL (ref 8.9–10.3)
Chloride: 109 mmol/L (ref 98–111)
Creatinine, Ser: 0.9 mg/dL (ref 0.44–1.00)
GFR, Estimated: >60 mL/min (ref >60)
Glucose, Bld: 112 mg/dL — ABNORMAL HIGH (ref 70–99)
Potassium: 4.3 mmol/L (ref 3.5–5.1)
Sodium: 137 mmol/L (ref 135–145)

## 2022-01-05 LAB — TROPONIN I (HIGH SENSITIVITY): Troponin I (High Sensitivity): 15 ng/L (ref <18)

## 2022-01-05 NOTE — ED Triage Notes (Signed)
Pt with pain to L breast since 1700 today. Non radiating, denies sob, n/v, or other associated symptoms. Hx dementia. 324 ASA given by EMS.

## 2022-01-06 NOTE — ED Notes (Signed)
Ptar called

## 2022-01-06 NOTE — Discharge Instructions (Signed)

## 2022-01-06 NOTE — ED Provider Notes (Signed)
[facility] EMERGENCY DEPARTMENT Provider Note   CSN: [id number] Arrival date & time: 01/05/22  1832     History  Chief complaint-chest pain  Level 5 caveat due to dementia  [name] is a 88 y.o. female.  The history is provided by the patient.   It is reported the patient was having chest pain.  She lives in a nursing facility.  It is reported she had pain to her left breast for several hours.  No other symptoms reported Patient denies any chest pain at this time.  She reports she never had chest pain.  She reports the only pain she is having is in her right arm from the blood pressure cuff     Home Medications Prior to Admission medications   Medication Sig Start Date End Date Taking? Authorizing Provider  acetaminophen (TYLENOL) 325 MG tablet Take 2 tablets (650 mg total) by mouth every 6 (six) hours. 01/25/20   Norm Parcel, PA-C  amLODipine (NORVASC) 2.5 MG tablet Take 2.5 mg by mouth daily. 09/03/19   [provider]  budesonide (ENTOCORT EC) 3 MG 24 hr capsule Take 3 capsules (9 mg total) by mouth daily. Patient taking differently: Take 3 mg by mouth daily.  11/16/18   Gatha Mayer, MD  calcium-vitamin D (OSCAL WITH D) 500-200 MG-UNIT tablet Take 1 tablet by mouth 2 (two) times daily.    [provider]  docusate sodium (COLACE) 100 MG capsule Take 1 capsule (100 mg total) by mouth 2 (two) times daily. 01/25/20   Norm Parcel, PA-C  donepezil (ARICEPT) 10 MG tablet Take 1 tablet daily Patient taking differently: Take 10 mg by mouth daily.  11/16/18   Cameron Sprang, MD  hydrALAZINE (APRESOLINE) 25 MG tablet Take 1 tablet (25 mg total) by mouth every 8 (eight) hours. Patient taking differently: Take 25 mg by mouth 3 (three) times daily.  05/11/18   Gatha Mayer, MD  lidocaine (LIDODERM) 5 % Place 1 patch onto the skin daily. Remove & Discard patch within 12 hours or as directed by MD 01/25/20   Norm Parcel, PA-C   losartan (COZAAR) 25 MG tablet Take 1 tablet by mouth once daily Patient taking differently: Take 25 mg by mouth daily.  12/15/18   Tonia Ghent, MD  methocarbamol (ROBAXIN) 500 MG tablet Take 1 tablet (500 mg total) by mouth 3 (three) times daily. 01/25/20   Norm Parcel, PA-C  Multiple Vitamin (MULTIVITAMIN) tablet Take 1 tablet by mouth daily.    [provider]  sertraline (ZOLOFT) 25 MG tablet Take 1 tablet by mouth once daily Patient taking differently: Take 25 mg by mouth daily.  08/13/18   Gatha Mayer, MD      Allergies    Other    Review of Systems   Review of Systems  Unable to perform ROS: Dementia    Physical Exam Updated Vital Signs BP (!) 160/79   Pulse 62   Temp 98.3 F (36.8 C) (Oral)   Resp (!) 27   SpO2 98%  Physical Exam CONSTITUTIONAL: Elderly, no acute distress HEAD: Normocephalic/atraumatic EYES: EOMI/PERRL ENMT: Mucous membranes moist NECK: supple no meningeal signs SPINE/BACK:entire spine nontender CV: S1/S2 noted, no murmurs/rubs/gallops noted LUNGS: Lungs are clear to auscultation bilaterally, no apparent distress Chest/breast-there is no bruising, crepitus or significant tenderness noted to either breast or the chest wall  female chaperone [place] is present for exam.  Patient has evidence of  previous breast augmentation ABDOMEN: soft, nontender NEURO: Pt is awake/alert conversant but confused.  She moves all extremities x4 EXTREMITIES: pulses normal/equal, full ROM, no deformities SKIN: warm, color normal   ED Results / Procedures / Treatments   Labs (all labs ordered are listed, but only abnormal results are displayed) Labs Reviewed  BASIC METABOLIC PANEL - Abnormal; Notable for the following components:      Result Value   Glucose, Bld 112 (*)    Anion gap 4 (*)    All other components within normal limits  CBC  TROPONIN I (HIGH SENSITIVITY)    EKG EKG Interpretation  Date/Time:  Saturday January 05 2022 18:50:28  EDT Ventricular Rate:  69 PR Interval:  174 QRS Duration: 68 QT Interval:  410 QTC Calculation: 439 R Axis:   20 Text Interpretation: Normal sinus rhythm Left ventricular hypertrophy with repolarization abnormality ( R in aVL , Sokolow-Lyon , Romhilt-Estes ) Abnormal ECG Interpretation limited secondary to artifact Confirmed by Ripley Fraise [phone]) on 01/06/2022 1:40:28 AM  Radiology DG Chest 2 View  Result Date: 01/05/2022 CLINICAL DATA:  Chest pain EXAM: CHEST - 2 VIEW COMPARISON:  Chest x-ray 01/19/2020.  Chest CT 01/18/2020. FINDINGS: There is mild elevation of the left hemidiaphragm. There is no focal lung infiltrate, pleural effusion or pneumothorax. The cardiomediastinal silhouette is within normal limits. There are multiple healed right-sided rib fractures. No acute fractures are seen. Peripherally calcified breast implants are again noted. There is scoliosis of the thoracolumbar spine. IMPRESSION: No active cardiopulmonary disease. Electronically Signed   By: Ronney Asters M.D.   On: 01/05/2022 19:20    Procedures Procedures    Medications Ordered in ED Medications - No data to display  ED Course/ Medical Decision Making/ A&P                           Medical Decision Making Amount and/or Complexity of Data Reviewed Labs: ordered. Radiology: ordered.   This patient presents to the ED for concern of chest pain, this involves an extensive number of treatment options, and is a complaint that carries with it a high risk of complications and morbidity.  The differential diagnosis includes but is not limited to acute coronary syndrome, aortic dissection, pulmonary embolism, pericarditis, pneumothorax, pneumonia, myocarditis, pleurisy, esophageal rupture    Comorbidities that complicate the patient evaluation: Patient's presentation is complicated by their history of dementia  Social Determinants of Health: Patient's  resides in a nursing home   increases the complexity of  managing their presentation  Additional history obtained: Additional history obtained from nursing home/care facility discussed with nurse at facility.  She was given report the patient had chest pain.  She has no other details.  She gave me phone numbers for her son and daughter.  I was unable to contact either by phone  Lab Tests: I Ordered, and personally interpreted labs.  The pertinent results include: Labs overall unremarkable  Imaging Studies ordered: I ordered imaging studies including X-ray chest   I independently visualized and interpreted imaging which showed no acute finding I agree with the radiologist interpretation  Cardiac Monitoring: The patient was maintained on a cardiac monitor.  I personally viewed and interpreted the cardiac monitor which showed an underlying rhythm of:  sinus rhythm   Complexity of problems addressed: Patient's presentation is most consistent with  acute presentation with potential threat to life or bodily function  Disposition: After consideration of the diagnostic results and  the patient's response to treatment,  I feel that the patent would benefit from discharge   .    Patient w/ history dementia presents with chest pain.  Patient is a poor historian, and denies ever having any chest pain.  Initial work-up in the ER was unremarkable.  She is in no acute distress.  She is chest pain-free at this time.  She will be discharged back to facility       Final Clinical Impression(s) / ED Diagnoses Final diagnoses:  Precordial pain    Rx / DC Orders ED Discharge Orders     None         Ripley Fraise, MD 01/06/22 [phone]

## 2022-01-06 NOTE — ED Notes (Signed)
Charleston Ropes daughter [phone] requesting to speak to a nurse

## 2022-01-07 DIAGNOSIS — M6281 Muscle weakness (generalized): Secondary | ICD-10-CM | POA: Diagnosis not present

## 2022-01-07 DIAGNOSIS — R262 Difficulty in walking, not elsewhere classified: Secondary | ICD-10-CM | POA: Diagnosis not present

## 2022-01-07 DIAGNOSIS — Z992 Dependence on renal dialysis: Secondary | ICD-10-CM | POA: Diagnosis not present

## 2022-01-07 DIAGNOSIS — R079 Chest pain, unspecified: Secondary | ICD-10-CM | POA: Diagnosis not present

## 2022-01-07 DIAGNOSIS — U071 COVID-19: Secondary | ICD-10-CM | POA: Diagnosis not present

## 2022-01-07 DIAGNOSIS — S2241XD Multiple fractures of ribs, right side, subsequent encounter for fracture with routine healing: Secondary | ICD-10-CM | POA: Diagnosis not present

## 2022-01-07 DIAGNOSIS — Z9181 History of falling: Secondary | ICD-10-CM | POA: Diagnosis not present

## 2022-01-08 DIAGNOSIS — Z992 Dependence on renal dialysis: Secondary | ICD-10-CM | POA: Diagnosis not present

## 2022-01-08 DIAGNOSIS — M545 Low back pain, unspecified: Secondary | ICD-10-CM | POA: Diagnosis not present

## 2022-01-08 DIAGNOSIS — S2241XD Multiple fractures of ribs, right side, subsequent encounter for fracture with routine healing: Secondary | ICD-10-CM | POA: Diagnosis not present

## 2022-01-08 DIAGNOSIS — U071 COVID-19: Secondary | ICD-10-CM | POA: Diagnosis not present

## 2022-01-08 DIAGNOSIS — Z9181 History of falling: Secondary | ICD-10-CM | POA: Diagnosis not present

## 2022-01-08 DIAGNOSIS — R262 Difficulty in walking, not elsewhere classified: Secondary | ICD-10-CM | POA: Diagnosis not present

## 2022-01-08 DIAGNOSIS — M6281 Muscle weakness (generalized): Secondary | ICD-10-CM | POA: Diagnosis not present

## 2022-01-09 DIAGNOSIS — R69 Illness, unspecified: Secondary | ICD-10-CM | POA: Diagnosis not present

## 2022-01-09 DIAGNOSIS — I5032 Chronic diastolic (congestive) heart failure: Secondary | ICD-10-CM | POA: Diagnosis not present

## 2022-01-19 DIAGNOSIS — F321 Major depressive disorder, single episode, moderate: Secondary | ICD-10-CM | POA: Diagnosis not present

## 2022-01-19 DIAGNOSIS — R69 Illness, unspecified: Secondary | ICD-10-CM | POA: Diagnosis not present

## 2022-01-21 DIAGNOSIS — N3281 Overactive bladder: Secondary | ICD-10-CM | POA: Diagnosis not present

## 2022-01-21 DIAGNOSIS — Z4689 Encounter for fitting and adjustment of other specified devices: Secondary | ICD-10-CM | POA: Diagnosis not present

## 2022-01-21 DIAGNOSIS — N813 Complete uterovaginal prolapse: Secondary | ICD-10-CM | POA: Diagnosis not present

## 2022-01-21 DIAGNOSIS — N952 Postmenopausal atrophic vaginitis: Secondary | ICD-10-CM | POA: Diagnosis not present

## 2022-01-24 DIAGNOSIS — H18413 Arcus senilis, bilateral: Secondary | ICD-10-CM | POA: Diagnosis not present

## 2022-01-24 DIAGNOSIS — H524 Presbyopia: Secondary | ICD-10-CM | POA: Diagnosis not present

## 2022-01-24 DIAGNOSIS — H2513 Age-related nuclear cataract, bilateral: Secondary | ICD-10-CM | POA: Diagnosis not present

## 2022-01-29 DIAGNOSIS — I7091 Generalized atherosclerosis: Secondary | ICD-10-CM | POA: Diagnosis not present

## 2022-01-30 DIAGNOSIS — F321 Major depressive disorder, single episode, moderate: Secondary | ICD-10-CM | POA: Diagnosis not present

## 2022-01-30 DIAGNOSIS — R69 Illness, unspecified: Secondary | ICD-10-CM | POA: Diagnosis not present

## 2022-02-05 DIAGNOSIS — F321 Major depressive disorder, single episode, moderate: Secondary | ICD-10-CM | POA: Diagnosis not present

## 2022-02-05 DIAGNOSIS — R69 Illness, unspecified: Secondary | ICD-10-CM | POA: Diagnosis not present

## 2022-02-13 DIAGNOSIS — R69 Illness, unspecified: Secondary | ICD-10-CM | POA: Diagnosis not present

## 2022-02-13 DIAGNOSIS — F321 Major depressive disorder, single episode, moderate: Secondary | ICD-10-CM | POA: Diagnosis not present

## 2022-02-15 DIAGNOSIS — F331 Major depressive disorder, recurrent, moderate: Secondary | ICD-10-CM | POA: Diagnosis not present

## 2022-02-15 DIAGNOSIS — R69 Illness, unspecified: Secondary | ICD-10-CM | POA: Diagnosis not present

## 2022-02-15 DIAGNOSIS — F02B4 Dementia in other diseases classified elsewhere, moderate, with anxiety: Secondary | ICD-10-CM | POA: Diagnosis not present

## 2022-02-15 DIAGNOSIS — G301 Alzheimer's disease with late onset: Secondary | ICD-10-CM | POA: Diagnosis not present

## 2022-02-15 DIAGNOSIS — F413 Other mixed anxiety disorders: Secondary | ICD-10-CM | POA: Diagnosis not present

## 2022-02-18 DIAGNOSIS — I5032 Chronic diastolic (congestive) heart failure: Secondary | ICD-10-CM | POA: Diagnosis not present

## 2022-02-18 DIAGNOSIS — R69 Illness, unspecified: Secondary | ICD-10-CM | POA: Diagnosis not present

## 2022-02-22 DIAGNOSIS — R69 Illness, unspecified: Secondary | ICD-10-CM | POA: Diagnosis not present

## 2022-02-22 DIAGNOSIS — F321 Major depressive disorder, single episode, moderate: Secondary | ICD-10-CM | POA: Diagnosis not present

## 2022-03-01 DIAGNOSIS — R69 Illness, unspecified: Secondary | ICD-10-CM | POA: Diagnosis not present

## 2022-03-01 DIAGNOSIS — F321 Major depressive disorder, single episode, moderate: Secondary | ICD-10-CM | POA: Diagnosis not present

## 2022-03-07 DIAGNOSIS — R69 Illness, unspecified: Secondary | ICD-10-CM | POA: Diagnosis not present

## 2022-03-07 DIAGNOSIS — R3981 Functional urinary incontinence: Secondary | ICD-10-CM | POA: Diagnosis not present

## 2022-03-07 DIAGNOSIS — S42001D Fracture of unspecified part of right clavicle, subsequent encounter for fracture with routine healing: Secondary | ICD-10-CM | POA: Diagnosis not present

## 2022-03-07 DIAGNOSIS — I10 Essential (primary) hypertension: Secondary | ICD-10-CM | POA: Diagnosis not present

## 2022-03-12 DIAGNOSIS — R69 Illness, unspecified: Secondary | ICD-10-CM | POA: Diagnosis not present

## 2022-03-12 DIAGNOSIS — G301 Alzheimer's disease with late onset: Secondary | ICD-10-CM | POA: Diagnosis not present

## 2022-03-12 DIAGNOSIS — F331 Major depressive disorder, recurrent, moderate: Secondary | ICD-10-CM | POA: Diagnosis not present

## 2022-03-12 DIAGNOSIS — F413 Other mixed anxiety disorders: Secondary | ICD-10-CM | POA: Diagnosis not present

## 2022-03-12 DIAGNOSIS — F02B4 Dementia in other diseases classified elsewhere, moderate, with anxiety: Secondary | ICD-10-CM | POA: Diagnosis not present

## 2022-03-19 DIAGNOSIS — R279 Unspecified lack of coordination: Secondary | ICD-10-CM | POA: Diagnosis not present

## 2022-03-19 DIAGNOSIS — M6281 Muscle weakness (generalized): Secondary | ICD-10-CM | POA: Diagnosis not present

## 2022-03-19 DIAGNOSIS — M818 Other osteoporosis without current pathological fracture: Secondary | ICD-10-CM | POA: Diagnosis not present

## 2022-03-19 DIAGNOSIS — S2241XD Multiple fractures of ribs, right side, subsequent encounter for fracture with routine healing: Secondary | ICD-10-CM | POA: Diagnosis not present

## 2022-03-20 DIAGNOSIS — M6281 Muscle weakness (generalized): Secondary | ICD-10-CM | POA: Diagnosis not present

## 2022-03-20 DIAGNOSIS — R279 Unspecified lack of coordination: Secondary | ICD-10-CM | POA: Diagnosis not present

## 2022-03-20 DIAGNOSIS — M818 Other osteoporosis without current pathological fracture: Secondary | ICD-10-CM | POA: Diagnosis not present

## 2022-03-20 DIAGNOSIS — S2241XD Multiple fractures of ribs, right side, subsequent encounter for fracture with routine healing: Secondary | ICD-10-CM | POA: Diagnosis not present

## 2022-03-21 DIAGNOSIS — M818 Other osteoporosis without current pathological fracture: Secondary | ICD-10-CM | POA: Diagnosis not present

## 2022-03-21 DIAGNOSIS — S2241XD Multiple fractures of ribs, right side, subsequent encounter for fracture with routine healing: Secondary | ICD-10-CM | POA: Diagnosis not present

## 2022-03-21 DIAGNOSIS — M6281 Muscle weakness (generalized): Secondary | ICD-10-CM | POA: Diagnosis not present

## 2022-03-21 DIAGNOSIS — R279 Unspecified lack of coordination: Secondary | ICD-10-CM | POA: Diagnosis not present

## 2022-03-22 DIAGNOSIS — M6281 Muscle weakness (generalized): Secondary | ICD-10-CM | POA: Diagnosis not present

## 2022-03-22 DIAGNOSIS — M818 Other osteoporosis without current pathological fracture: Secondary | ICD-10-CM | POA: Diagnosis not present

## 2022-03-22 DIAGNOSIS — S2241XD Multiple fractures of ribs, right side, subsequent encounter for fracture with routine healing: Secondary | ICD-10-CM | POA: Diagnosis not present

## 2022-03-22 DIAGNOSIS — R279 Unspecified lack of coordination: Secondary | ICD-10-CM | POA: Diagnosis not present

## 2022-03-25 DIAGNOSIS — I10 Essential (primary) hypertension: Secondary | ICD-10-CM | POA: Diagnosis not present

## 2022-03-25 DIAGNOSIS — M818 Other osteoporosis without current pathological fracture: Secondary | ICD-10-CM | POA: Diagnosis not present

## 2022-03-25 DIAGNOSIS — S2241XD Multiple fractures of ribs, right side, subsequent encounter for fracture with routine healing: Secondary | ICD-10-CM | POA: Diagnosis not present

## 2022-03-25 DIAGNOSIS — R7309 Other abnormal glucose: Secondary | ICD-10-CM | POA: Diagnosis not present

## 2022-03-25 DIAGNOSIS — M6281 Muscle weakness (generalized): Secondary | ICD-10-CM | POA: Diagnosis not present

## 2022-03-25 DIAGNOSIS — R279 Unspecified lack of coordination: Secondary | ICD-10-CM | POA: Diagnosis not present

## 2022-03-26 DIAGNOSIS — M6281 Muscle weakness (generalized): Secondary | ICD-10-CM | POA: Diagnosis not present

## 2022-03-26 DIAGNOSIS — R69 Illness, unspecified: Secondary | ICD-10-CM | POA: Diagnosis not present

## 2022-03-26 DIAGNOSIS — M818 Other osteoporosis without current pathological fracture: Secondary | ICD-10-CM | POA: Diagnosis not present

## 2022-03-26 DIAGNOSIS — R279 Unspecified lack of coordination: Secondary | ICD-10-CM | POA: Diagnosis not present

## 2022-03-26 DIAGNOSIS — S2241XD Multiple fractures of ribs, right side, subsequent encounter for fracture with routine healing: Secondary | ICD-10-CM | POA: Diagnosis not present

## 2022-03-26 DIAGNOSIS — I5032 Chronic diastolic (congestive) heart failure: Secondary | ICD-10-CM | POA: Diagnosis not present

## 2022-03-27 DIAGNOSIS — R279 Unspecified lack of coordination: Secondary | ICD-10-CM | POA: Diagnosis not present

## 2022-03-27 DIAGNOSIS — M6281 Muscle weakness (generalized): Secondary | ICD-10-CM | POA: Diagnosis not present

## 2022-03-27 DIAGNOSIS — S2241XD Multiple fractures of ribs, right side, subsequent encounter for fracture with routine healing: Secondary | ICD-10-CM | POA: Diagnosis not present

## 2022-03-27 DIAGNOSIS — M818 Other osteoporosis without current pathological fracture: Secondary | ICD-10-CM | POA: Diagnosis not present

## 2022-03-28 DIAGNOSIS — M818 Other osteoporosis without current pathological fracture: Secondary | ICD-10-CM | POA: Diagnosis not present

## 2022-03-28 DIAGNOSIS — R279 Unspecified lack of coordination: Secondary | ICD-10-CM | POA: Diagnosis not present

## 2022-03-28 DIAGNOSIS — M6281 Muscle weakness (generalized): Secondary | ICD-10-CM | POA: Diagnosis not present

## 2022-03-28 DIAGNOSIS — S2241XD Multiple fractures of ribs, right side, subsequent encounter for fracture with routine healing: Secondary | ICD-10-CM | POA: Diagnosis not present

## 2022-04-01 DIAGNOSIS — R279 Unspecified lack of coordination: Secondary | ICD-10-CM | POA: Diagnosis not present

## 2022-04-01 DIAGNOSIS — E440 Moderate protein-calorie malnutrition: Secondary | ICD-10-CM | POA: Diagnosis not present

## 2022-04-01 DIAGNOSIS — S2241XD Multiple fractures of ribs, right side, subsequent encounter for fracture with routine healing: Secondary | ICD-10-CM | POA: Diagnosis not present

## 2022-04-01 DIAGNOSIS — M818 Other osteoporosis without current pathological fracture: Secondary | ICD-10-CM | POA: Diagnosis not present

## 2022-04-01 DIAGNOSIS — M6281 Muscle weakness (generalized): Secondary | ICD-10-CM | POA: Diagnosis not present

## 2022-04-16 DIAGNOSIS — F413 Other mixed anxiety disorders: Secondary | ICD-10-CM | POA: Diagnosis not present

## 2022-04-16 DIAGNOSIS — G301 Alzheimer's disease with late onset: Secondary | ICD-10-CM | POA: Diagnosis not present

## 2022-04-16 DIAGNOSIS — F331 Major depressive disorder, recurrent, moderate: Secondary | ICD-10-CM | POA: Diagnosis not present

## 2022-04-16 DIAGNOSIS — R69 Illness, unspecified: Secondary | ICD-10-CM | POA: Diagnosis not present

## 2022-04-16 DIAGNOSIS — F02B4 Dementia in other diseases classified elsewhere, moderate, with anxiety: Secondary | ICD-10-CM | POA: Diagnosis not present

## 2022-04-22 DIAGNOSIS — I7091 Generalized atherosclerosis: Secondary | ICD-10-CM | POA: Diagnosis not present

## 2022-04-24 DIAGNOSIS — L03012 Cellulitis of left finger: Secondary | ICD-10-CM | POA: Diagnosis not present

## 2022-04-25 DIAGNOSIS — M19042 Primary osteoarthritis, left hand: Secondary | ICD-10-CM | POA: Diagnosis not present

## 2022-04-26 DIAGNOSIS — L03012 Cellulitis of left finger: Secondary | ICD-10-CM | POA: Diagnosis not present

## 2022-04-27 ENCOUNTER — Emergency Department (HOSPITAL_COMMUNITY)
Admission: EM | Admit: 2022-04-27 | Discharge: 2022-04-27 | Disposition: A | Discharge status: Skilled Nursing Facility | Payer: Medicare HMO | Attending: Emergency Medicine | Admitting: Emergency Medicine

## 2022-04-27 ENCOUNTER — Emergency Department (HOSPITAL_COMMUNITY): Payer: Medicare HMO

## 2022-04-27 ENCOUNTER — Other Ambulatory Visit: Payer: Self-pay

## 2022-04-27 DIAGNOSIS — R509 Fever, unspecified: Secondary | ICD-10-CM | POA: Diagnosis not present

## 2022-04-27 DIAGNOSIS — Z23 Encounter for immunization: Secondary | ICD-10-CM | POA: Insufficient documentation

## 2022-04-27 DIAGNOSIS — I509 Heart failure, unspecified: Secondary | ICD-10-CM | POA: Insufficient documentation

## 2022-04-27 DIAGNOSIS — R600 Localized edema: Secondary | ICD-10-CM | POA: Diagnosis not present

## 2022-04-27 DIAGNOSIS — Z7401 Bed confinement status: Secondary | ICD-10-CM | POA: Diagnosis not present

## 2022-04-27 DIAGNOSIS — M79642 Pain in left hand: Secondary | ICD-10-CM | POA: Diagnosis present

## 2022-04-27 DIAGNOSIS — L02512 Cutaneous abscess of left hand: Secondary | ICD-10-CM | POA: Diagnosis not present

## 2022-04-27 DIAGNOSIS — Z79899 Other long term (current) drug therapy: Secondary | ICD-10-CM | POA: Insufficient documentation

## 2022-04-27 DIAGNOSIS — I959 Hypotension, unspecified: Secondary | ICD-10-CM | POA: Diagnosis not present

## 2022-04-27 DIAGNOSIS — R609 Edema, unspecified: Secondary | ICD-10-CM | POA: Diagnosis not present

## 2022-04-27 DIAGNOSIS — R531 Weakness: Secondary | ICD-10-CM | POA: Diagnosis not present

## 2022-04-27 DIAGNOSIS — Z743 Need for continuous supervision: Secondary | ICD-10-CM | POA: Diagnosis not present

## 2022-04-27 DIAGNOSIS — T68XXXA Hypothermia, initial encounter: Secondary | ICD-10-CM | POA: Diagnosis not present

## 2022-04-27 DIAGNOSIS — R4182 Altered mental status, unspecified: Secondary | ICD-10-CM | POA: Diagnosis not present

## 2022-04-27 MED ORDER — CEPHALEXIN 500 MG PO CAPS
500.0000 mg | ORAL_CAPSULE | Freq: Once | ORAL | Status: AC
  Administered 2022-04-27: 500 mg via ORAL
  Filled 2022-04-27: qty 1

## 2022-04-27 MED ORDER — LIDOCAINE HCL (PF) 1 % IJ SOLN
30.0000 mL | Freq: Once | INTRAMUSCULAR | Status: AC
  Administered 2022-04-27: 30 mL
  Filled 2022-04-27: qty 30

## 2022-04-27 MED ORDER — CEPHALEXIN 500 MG PO CAPS: 500.0000 mg | ORAL_CAPSULE | Freq: Two times a day (BID) | ORAL | 0 refills | Status: AC

## 2022-04-27 MED ORDER — TETANUS-DIPHTH-ACELL PERTUSSIS 5-2.5-18.5 LF-MCG/0.5 IM SUSY
0.5000 mL | PREFILLED_SYRINGE | Freq: Once | INTRAMUSCULAR | Status: AC
  Administered 2022-04-27: 0.5 mL via INTRAMUSCULAR
  Filled 2022-04-27: qty 0.5

## 2022-04-27 NOTE — Discharge Instructions (Addendum)
As discussed, you have been diagnosed with an infection of the finger of her hand.  Now, after debridement, it will take some time for your finger to heal.  It is important you take your prescribed antibiotics and monitor your condition.  If you develop any concerning changes at all, including redness that is spreading from your hand to your wrist, fevers, loss of sensation, please return here immediately or follow-up with our hand surgery specialist.

## 2022-04-27 NOTE — ED Provider Notes (Signed)
[facility] DEPT Provider Note   CSN: [id number] Arrival date & time: 04/27/22  1734     History  Chief Complaint  Patient presents with   Hand Problem    [name] is a 88 y.o. female.  HPI Patient presents via EMS from nursing home due to concern for possible finger infection.  Patient does have some confusion at baseline, but reportedly is interacting in a typical manner for her.  Patient is unsure when it began, but she presents due to discoloration and pain throughout the fourth digit on the left hand.  Pain is worse with motion.  EMS reports that staff thinks the symptoms are new today.  Level 5 caveat secondary to confusion.    Home Medications Prior to Admission medications   Medication Sig Start Date End Date Taking? Authorizing Provider  cephALEXin (KEFLEX) 500 MG capsule Take 1 capsule (500 mg total) by mouth 2 (two) times daily for 5 days. 04/27/22 05/02/22 Yes Carmin Muskrat, MD  acetaminophen (TYLENOL) 325 MG tablet Take 2 tablets (650 mg total) by mouth every 6 (six) hours. 01/25/20   Norm Parcel, PA-C  amLODipine (NORVASC) 2.5 MG tablet Take 2.5 mg by mouth daily. 09/03/19   [provider]  budesonide (ENTOCORT EC) 3 MG 24 hr capsule Take 3 capsules (9 mg total) by mouth daily. Patient taking differently: Take 3 mg by mouth daily.  11/16/18   Gatha Mayer, MD  calcium-vitamin D (OSCAL WITH D) 500-200 MG-UNIT tablet Take 1 tablet by mouth 2 (two) times daily.    [provider]  docusate sodium (COLACE) 100 MG capsule Take 1 capsule (100 mg total) by mouth 2 (two) times daily. 01/25/20   Norm Parcel, PA-C  donepezil (ARICEPT) 10 MG tablet Take 1 tablet daily Patient taking differently: Take 10 mg by mouth daily.  11/16/18   Cameron Sprang, MD  hydrALAZINE (APRESOLINE) 25 MG tablet Take 1 tablet (25 mg total) by mouth every 8 (eight) hours. Patient taking differently: Take 25 mg by mouth 3 (three) times  daily.  05/11/18   Gatha Mayer, MD  lidocaine (LIDODERM) 5 % Place 1 patch onto the skin daily. Remove & Discard patch within 12 hours or as directed by MD 01/25/20   Norm Parcel, PA-C  losartan (COZAAR) 25 MG tablet Take 1 tablet by mouth once daily Patient taking differently: Take 25 mg by mouth daily.  12/15/18   Tonia Ghent, MD  methocarbamol (ROBAXIN) 500 MG tablet Take 1 tablet (500 mg total) by mouth 3 (three) times daily. 01/25/20   Norm Parcel, PA-C  Multiple Vitamin (MULTIVITAMIN) tablet Take 1 tablet by mouth daily.    [provider]  sertraline (ZOLOFT) 25 MG tablet Take 1 tablet by mouth once daily Patient taking differently: Take 25 mg by mouth daily.  08/13/18   Gatha Mayer, MD      Allergies    Other    Review of Systems   Review of Systems  All other systems reviewed and are negative.   Physical Exam Updated Vital Signs BP 118/78 (BP Location: Right Arm)   Pulse 77   Temp 98 F (36.7 C) (Oral)   Resp 17   Ht 5' 2"  (1.575 m)   Wt 48 kg   SpO2 95%   BMI 19.35 kg/m  Physical Exam Vitals and nursing note reviewed.  Constitutional:      General: She is not in acute  distress.    Appearance: She is well-developed.  HENT:     Head: Normocephalic and atraumatic.  Eyes:     Conjunctiva/sclera: Conjunctivae normal.  Cardiovascular:     Rate and Rhythm: Normal rate and regular rhythm.  Pulmonary:     Effort: Pulmonary effort is normal. No respiratory distress.     Breath sounds: Normal breath sounds. No stridor.  Abdominal:     General: There is no distension.  Musculoskeletal:       Arms:  Skin:    General: Skin is warm and dry.  Neurological:     Mental Status: She is alert and oriented to person, place, and time.     Cranial Nerves: No cranial nerve deficit.  Psychiatric:        Mood and Affect: Mood normal.     ED Results / Procedures / Treatments   Labs (all labs ordered are listed, but only abnormal results are  displayed) Labs Reviewed - No data to display  EKG None  Radiology DG Hand Complete Left  Result Date: 04/27/2022 CLINICAL DATA:  infection at L fourth digit, concern for deep / osteo EXAM: LEFT HAND - COMPLETE 3+ VIEW COMPARISON:  None Available. FINDINGS: Osteopenia. No acute fracture or dislocation. Degenerative changes of the first [place]. Scattered degenerative changes of the carpal bones. Chondrocalcinosis. Scattered degenerative changes throughout the PIP and DIP. No area of erosion or osseous destruction. No unexpected radiopaque foreign body. Soft tissue edema of the fourth digit. IMPRESSION: Soft tissue edema of the fourth digit without radiographic evidence of underlying osteomyelitis. If persistent concern, recommend dedicated cross-sectional imaging. Electronically Signed   By: Valentino Saxon M.D.   On: 04/27/2022 18:49    Procedures .Marland KitchenIncision and Drainage  Date/Time: 04/27/2022 7:12 PM  Performed by: Carmin Muskrat, MD Authorized by: Carmin Muskrat, MD   Consent:    Consent obtained:  Verbal   Consent given by: and daughter (phone)   Risks, benefits, and alternatives were discussed: yes     Risks discussed:  Bleeding, incomplete drainage, pain and infection   Alternatives discussed:  Alternative treatment Universal protocol:    Procedure explained and questions answered to patient or proxy's satisfaction: yes     Relevant documents present and verified: yes     Test results available : yes     Imaging studies available: yes     Required blood products, implants, devices, and special equipment available: yes     Site/side marked: yes     Immediately prior to procedure, a time out was called: yes     Patient identity confirmed:  Verbally with patient Location:    Type:  Abscess   Size:  6cm   Location:  Upper extremity   Upper extremity location:  Finger   Finger location:  L ring finger Pre-procedure details:    Skin preparation:  Chlorhexidine Sedation:     Sedation type:  None Anesthesia:    Anesthesia method:  Nerve block   Block location:  Ring digit   Block needle gauge:  25 G   Block anesthetic:  Lidocaine 1% w/o epi   Block technique:  Ring   Block injection procedure:  Anatomic landmarks identified, introduced needle, incremental injection, negative aspiration for blood and anatomic landmarks palpated   Block outcome:  Anesthesia achieved Procedure type:    Complexity:  Complex Procedure details:    Ultrasound guidance: no     Needle aspiration: no     Incision types:  Elliptical and  single with marsupialization   Incision depth:  Subcutaneous   Wound management:  Probed and deloculated, irrigated with saline and extensive cleaning   Drainage:  Purulent   Drainage amount:  Copious   Wound treatment:  Wound left open   Packing material: nonadherant gauze. Post-procedure details:    Procedure completion:  Tolerated     Medications Ordered in ED Medications  lidocaine (PF) (XYLOCAINE) 1 % injection 30 mL (has no administration in time range)  Tdap (BOOSTRIX) injection 0.5 mL (has no administration in time range)  cephALEXin (KEFLEX) capsule 500 mg (500 mg Oral Given 04/27/22 1839)    ED Course/ Medical Decision Making/ A&P                           Medical Decision Making Patient with multiple medical issues including CHF, Crohn's, advanced age, confusion presents with finger lesion.  Concern for infection, paronychia and proximal some suspicion for tendon involvement given the ascending erythema on the dorsum of the hand, but no loss of range of motion.  Less likely bacteremia, sepsis.  Amount and/or Complexity of Data Reviewed Independent Historian: EMS External Data Reviewed: notes. Radiology: ordered. Decision-making details documented in ED Course.  Risk Prescription drug management.  After the initial evaluation I reviewed the patient's x-ray I discussed the patient's presentation with her daughter via  telephone.  Patient with incision and drainage of fourth digit dorsal.  See procedure note for full details, but in essence once patient had ring block which was well-tolerated, and initial incision into the substantial bulla on the dorsal surface of the skin throughout the distal 75% of the finger sloughed off with underlying purulence and bleeding.  Tissue was clearly nonviable, and debridement was required.  Patient did tolerate this well, and following the procedure was able to flex and extend the finger.  At the erythema that had previously been on the dorsum of the hand improved.  Without pain with extension, and with improved erythema on the dorsum of the hand, lower suspicion for tendon involvement, though with her advanced age, confusion, she will require close outpatient monitoring.  Patient is a nursing home resident, instructions were provided on discharge for observation of the wound, return precautions.           Final Clinical Impression(s) / ED Diagnoses Final diagnoses:  Abscess of finger of left hand    Rx / DC Orders ED Discharge Orders          Ordered    cephALEXin (KEFLEX) 500 MG capsule  2 times daily        04/27/22 1917              Carmin Muskrat, MD 04/27/22 1918

## 2022-04-27 NOTE — ED Triage Notes (Signed)
Pt BIB EMS from [facility] c/o left ring finger swelling and discoloration

## 2022-04-28 NOTE — ED Provider Notes (Signed)
2:42 PM I spoke with the patient's daughter, Marnette Burgess, about follow-up, she was amenable to instructions, notes that her mother is okay today.   Carmin Muskrat, MD 04/28/22 [phone]

## 2022-04-29 DIAGNOSIS — L03012 Cellulitis of left finger: Secondary | ICD-10-CM | POA: Diagnosis not present

## 2022-04-30 ENCOUNTER — Telehealth: Payer: Self-pay | Admitting: *Deleted

## 2022-04-30 DIAGNOSIS — T148XXA Other injury of unspecified body region, initial encounter: Secondary | ICD-10-CM | POA: Diagnosis not present

## 2022-04-30 DIAGNOSIS — M79645 Pain in left finger(s): Secondary | ICD-10-CM | POA: Diagnosis not present

## 2022-04-30 DIAGNOSIS — S60429A Blister (nonthermal) of unspecified finger, initial encounter: Secondary | ICD-10-CM | POA: Diagnosis not present

## 2022-04-30 DIAGNOSIS — L089 Local infection of the skin and subcutaneous tissue, unspecified: Secondary | ICD-10-CM | POA: Diagnosis not present

## 2022-04-30 DIAGNOSIS — M25642 Stiffness of left hand, not elsewhere classified: Secondary | ICD-10-CM | POA: Diagnosis not present

## 2022-04-30 NOTE — Telephone Encounter (Signed)
     Patient  visit on 04/27/2022  at [facility] long ed  was for injured finger   Have you been able to follow up with your primary care physician? Mom in SNF doing well after infection in finger will see staff dr  The patient was able to obtain any needed medicine or equipment.  Are there diet recommendations that you are having difficulty following?  Patient expresses understanding of discharge instructions and education provided has no other needs at this time.   [place] [phone] [address] , [place] [address] Email : [email or web address]

## 2022-05-01 DIAGNOSIS — L03012 Cellulitis of left finger: Secondary | ICD-10-CM | POA: Diagnosis not present

## 2022-05-02 DIAGNOSIS — L089 Local infection of the skin and subcutaneous tissue, unspecified: Secondary | ICD-10-CM | POA: Diagnosis not present

## 2022-05-06 DIAGNOSIS — L03012 Cellulitis of left finger: Secondary | ICD-10-CM | POA: Diagnosis not present

## 2022-05-08 DIAGNOSIS — I5032 Chronic diastolic (congestive) heart failure: Secondary | ICD-10-CM | POA: Diagnosis not present

## 2022-05-08 DIAGNOSIS — R69 Illness, unspecified: Secondary | ICD-10-CM | POA: Diagnosis not present

## 2022-05-08 DIAGNOSIS — L03012 Cellulitis of left finger: Secondary | ICD-10-CM | POA: Diagnosis not present

## 2022-05-10 DIAGNOSIS — F32A Depression, unspecified: Secondary | ICD-10-CM | POA: Diagnosis not present

## 2022-05-10 DIAGNOSIS — R69 Illness, unspecified: Secondary | ICD-10-CM | POA: Diagnosis not present

## 2022-05-15 DIAGNOSIS — L089 Local infection of the skin and subcutaneous tissue, unspecified: Secondary | ICD-10-CM | POA: Diagnosis not present

## 2022-05-15 DIAGNOSIS — S60429A Blister (nonthermal) of unspecified finger, initial encounter: Secondary | ICD-10-CM | POA: Diagnosis not present

## 2022-05-17 DIAGNOSIS — L03012 Cellulitis of left finger: Secondary | ICD-10-CM | POA: Diagnosis not present

## 2022-05-17 DIAGNOSIS — R69 Illness, unspecified: Secondary | ICD-10-CM | POA: Diagnosis not present

## 2022-05-17 DIAGNOSIS — I10 Essential (primary) hypertension: Secondary | ICD-10-CM | POA: Diagnosis not present

## 2022-05-17 DIAGNOSIS — I5032 Chronic diastolic (congestive) heart failure: Secondary | ICD-10-CM | POA: Diagnosis not present

## 2022-05-20 DIAGNOSIS — F331 Major depressive disorder, recurrent, moderate: Secondary | ICD-10-CM | POA: Diagnosis not present

## 2022-05-20 DIAGNOSIS — F02B4 Dementia in other diseases classified elsewhere, moderate, with anxiety: Secondary | ICD-10-CM | POA: Diagnosis not present

## 2022-05-20 DIAGNOSIS — F413 Other mixed anxiety disorders: Secondary | ICD-10-CM | POA: Diagnosis not present

## 2022-05-20 DIAGNOSIS — I10 Essential (primary) hypertension: Secondary | ICD-10-CM | POA: Diagnosis not present

## 2022-05-20 DIAGNOSIS — R69 Illness, unspecified: Secondary | ICD-10-CM | POA: Diagnosis not present

## 2022-05-20 DIAGNOSIS — G301 Alzheimer's disease with late onset: Secondary | ICD-10-CM | POA: Diagnosis not present

## 2022-05-31 DIAGNOSIS — R69 Illness, unspecified: Secondary | ICD-10-CM | POA: Diagnosis not present

## 2022-05-31 DIAGNOSIS — I5032 Chronic diastolic (congestive) heart failure: Secondary | ICD-10-CM | POA: Diagnosis not present

## 2022-06-06 DIAGNOSIS — R69 Illness, unspecified: Secondary | ICD-10-CM | POA: Diagnosis not present

## 2022-06-06 DIAGNOSIS — F0280 Dementia in other diseases classified elsewhere without behavioral disturbance: Secondary | ICD-10-CM | POA: Diagnosis not present

## 2022-06-06 DIAGNOSIS — F32A Depression, unspecified: Secondary | ICD-10-CM | POA: Diagnosis not present

## 2022-06-11 DIAGNOSIS — R0602 Shortness of breath: Secondary | ICD-10-CM | POA: Diagnosis not present

## 2022-06-12 DIAGNOSIS — U071 COVID-19: Secondary | ICD-10-CM | POA: Diagnosis not present

## 2022-06-12 DIAGNOSIS — E119 Type 2 diabetes mellitus without complications: Secondary | ICD-10-CM | POA: Diagnosis not present

## 2022-06-19 DIAGNOSIS — R69 Illness, unspecified: Secondary | ICD-10-CM | POA: Diagnosis not present

## 2022-06-19 DIAGNOSIS — F32A Depression, unspecified: Secondary | ICD-10-CM | POA: Diagnosis not present

## 2022-06-20 DIAGNOSIS — R488 Other symbolic dysfunctions: Secondary | ICD-10-CM | POA: Diagnosis not present

## 2022-06-20 DIAGNOSIS — R69 Illness, unspecified: Secondary | ICD-10-CM | POA: Diagnosis not present

## 2022-06-20 DIAGNOSIS — F0280 Dementia in other diseases classified elsewhere without behavioral disturbance: Secondary | ICD-10-CM | POA: Diagnosis not present

## 2022-06-20 DIAGNOSIS — I5032 Chronic diastolic (congestive) heart failure: Secondary | ICD-10-CM | POA: Diagnosis not present

## 2022-06-20 DIAGNOSIS — S2241XD Multiple fractures of ribs, right side, subsequent encounter for fracture with routine healing: Secondary | ICD-10-CM | POA: Diagnosis not present

## 2022-06-20 DIAGNOSIS — M6281 Muscle weakness (generalized): Secondary | ICD-10-CM | POA: Diagnosis not present

## 2022-06-20 DIAGNOSIS — U071 COVID-19: Secondary | ICD-10-CM | POA: Diagnosis not present

## 2022-06-21 DIAGNOSIS — U071 COVID-19: Secondary | ICD-10-CM | POA: Diagnosis not present

## 2022-06-21 DIAGNOSIS — R488 Other symbolic dysfunctions: Secondary | ICD-10-CM | POA: Diagnosis not present

## 2022-06-21 DIAGNOSIS — S2241XD Multiple fractures of ribs, right side, subsequent encounter for fracture with routine healing: Secondary | ICD-10-CM | POA: Diagnosis not present

## 2022-06-21 DIAGNOSIS — R69 Illness, unspecified: Secondary | ICD-10-CM | POA: Diagnosis not present

## 2022-06-21 DIAGNOSIS — M6281 Muscle weakness (generalized): Secondary | ICD-10-CM | POA: Diagnosis not present

## 2022-06-21 DIAGNOSIS — F0280 Dementia in other diseases classified elsewhere without behavioral disturbance: Secondary | ICD-10-CM | POA: Diagnosis not present

## 2022-06-21 DIAGNOSIS — I5032 Chronic diastolic (congestive) heart failure: Secondary | ICD-10-CM | POA: Diagnosis not present

## 2022-06-22 DIAGNOSIS — R488 Other symbolic dysfunctions: Secondary | ICD-10-CM | POA: Diagnosis not present

## 2022-06-22 DIAGNOSIS — S2241XD Multiple fractures of ribs, right side, subsequent encounter for fracture with routine healing: Secondary | ICD-10-CM | POA: Diagnosis not present

## 2022-06-22 DIAGNOSIS — U071 COVID-19: Secondary | ICD-10-CM | POA: Diagnosis not present

## 2022-06-22 DIAGNOSIS — R69 Illness, unspecified: Secondary | ICD-10-CM | POA: Diagnosis not present

## 2022-06-22 DIAGNOSIS — I5032 Chronic diastolic (congestive) heart failure: Secondary | ICD-10-CM | POA: Diagnosis not present

## 2022-06-22 DIAGNOSIS — M6281 Muscle weakness (generalized): Secondary | ICD-10-CM | POA: Diagnosis not present

## 2022-06-22 DIAGNOSIS — F0280 Dementia in other diseases classified elsewhere without behavioral disturbance: Secondary | ICD-10-CM | POA: Diagnosis not present

## 2022-06-24 DIAGNOSIS — M6281 Muscle weakness (generalized): Secondary | ICD-10-CM | POA: Diagnosis not present

## 2022-06-24 DIAGNOSIS — R488 Other symbolic dysfunctions: Secondary | ICD-10-CM | POA: Diagnosis not present

## 2022-06-24 DIAGNOSIS — L03012 Cellulitis of left finger: Secondary | ICD-10-CM | POA: Diagnosis not present

## 2022-06-24 DIAGNOSIS — F0280 Dementia in other diseases classified elsewhere without behavioral disturbance: Secondary | ICD-10-CM | POA: Diagnosis not present

## 2022-06-24 DIAGNOSIS — R69 Illness, unspecified: Secondary | ICD-10-CM | POA: Diagnosis not present

## 2022-06-24 DIAGNOSIS — U071 COVID-19: Secondary | ICD-10-CM | POA: Diagnosis not present

## 2022-06-24 DIAGNOSIS — S2241XD Multiple fractures of ribs, right side, subsequent encounter for fracture with routine healing: Secondary | ICD-10-CM | POA: Diagnosis not present

## 2022-06-24 DIAGNOSIS — I5032 Chronic diastolic (congestive) heart failure: Secondary | ICD-10-CM | POA: Diagnosis not present

## 2022-06-25 DIAGNOSIS — S2241XD Multiple fractures of ribs, right side, subsequent encounter for fracture with routine healing: Secondary | ICD-10-CM | POA: Diagnosis not present

## 2022-06-25 DIAGNOSIS — R488 Other symbolic dysfunctions: Secondary | ICD-10-CM | POA: Diagnosis not present

## 2022-06-25 DIAGNOSIS — G301 Alzheimer's disease with late onset: Secondary | ICD-10-CM | POA: Diagnosis not present

## 2022-06-25 DIAGNOSIS — I5032 Chronic diastolic (congestive) heart failure: Secondary | ICD-10-CM | POA: Diagnosis not present

## 2022-06-25 DIAGNOSIS — F331 Major depressive disorder, recurrent, moderate: Secondary | ICD-10-CM | POA: Diagnosis not present

## 2022-06-25 DIAGNOSIS — F0280 Dementia in other diseases classified elsewhere without behavioral disturbance: Secondary | ICD-10-CM | POA: Diagnosis not present

## 2022-06-25 DIAGNOSIS — U071 COVID-19: Secondary | ICD-10-CM | POA: Diagnosis not present

## 2022-06-25 DIAGNOSIS — F02B4 Dementia in other diseases classified elsewhere, moderate, with anxiety: Secondary | ICD-10-CM | POA: Diagnosis not present

## 2022-06-25 DIAGNOSIS — R69 Illness, unspecified: Secondary | ICD-10-CM | POA: Diagnosis not present

## 2022-06-25 DIAGNOSIS — F413 Other mixed anxiety disorders: Secondary | ICD-10-CM | POA: Diagnosis not present

## 2022-06-25 DIAGNOSIS — M6281 Muscle weakness (generalized): Secondary | ICD-10-CM | POA: Diagnosis not present

## 2022-06-27 DIAGNOSIS — I5032 Chronic diastolic (congestive) heart failure: Secondary | ICD-10-CM | POA: Diagnosis not present

## 2022-06-27 DIAGNOSIS — I10 Essential (primary) hypertension: Secondary | ICD-10-CM | POA: Diagnosis not present

## 2022-06-27 DIAGNOSIS — R69 Illness, unspecified: Secondary | ICD-10-CM | POA: Diagnosis not present

## 2022-06-27 DIAGNOSIS — R3981 Functional urinary incontinence: Secondary | ICD-10-CM | POA: Diagnosis not present

## 2022-06-28 DIAGNOSIS — S2241XD Multiple fractures of ribs, right side, subsequent encounter for fracture with routine healing: Secondary | ICD-10-CM | POA: Diagnosis not present

## 2022-06-28 DIAGNOSIS — F0280 Dementia in other diseases classified elsewhere without behavioral disturbance: Secondary | ICD-10-CM | POA: Diagnosis not present

## 2022-06-28 DIAGNOSIS — I5032 Chronic diastolic (congestive) heart failure: Secondary | ICD-10-CM | POA: Diagnosis not present

## 2022-06-28 DIAGNOSIS — R69 Illness, unspecified: Secondary | ICD-10-CM | POA: Diagnosis not present

## 2022-06-28 DIAGNOSIS — M6281 Muscle weakness (generalized): Secondary | ICD-10-CM | POA: Diagnosis not present

## 2022-06-28 DIAGNOSIS — R488 Other symbolic dysfunctions: Secondary | ICD-10-CM | POA: Diagnosis not present

## 2022-06-28 DIAGNOSIS — U071 COVID-19: Secondary | ICD-10-CM | POA: Diagnosis not present

## 2022-06-30 DIAGNOSIS — R69 Illness, unspecified: Secondary | ICD-10-CM | POA: Diagnosis not present

## 2022-06-30 DIAGNOSIS — U071 COVID-19: Secondary | ICD-10-CM | POA: Diagnosis not present

## 2022-06-30 DIAGNOSIS — M6281 Muscle weakness (generalized): Secondary | ICD-10-CM | POA: Diagnosis not present

## 2022-06-30 DIAGNOSIS — R488 Other symbolic dysfunctions: Secondary | ICD-10-CM | POA: Diagnosis not present

## 2022-06-30 DIAGNOSIS — I5032 Chronic diastolic (congestive) heart failure: Secondary | ICD-10-CM | POA: Diagnosis not present

## 2022-06-30 DIAGNOSIS — S2241XD Multiple fractures of ribs, right side, subsequent encounter for fracture with routine healing: Secondary | ICD-10-CM | POA: Diagnosis not present

## 2022-06-30 DIAGNOSIS — F0280 Dementia in other diseases classified elsewhere without behavioral disturbance: Secondary | ICD-10-CM | POA: Diagnosis not present

## 2022-07-01 DIAGNOSIS — S2241XD Multiple fractures of ribs, right side, subsequent encounter for fracture with routine healing: Secondary | ICD-10-CM | POA: Diagnosis not present

## 2022-07-01 DIAGNOSIS — R69 Illness, unspecified: Secondary | ICD-10-CM | POA: Diagnosis not present

## 2022-07-01 DIAGNOSIS — I5032 Chronic diastolic (congestive) heart failure: Secondary | ICD-10-CM | POA: Diagnosis not present

## 2022-07-01 DIAGNOSIS — F0280 Dementia in other diseases classified elsewhere without behavioral disturbance: Secondary | ICD-10-CM | POA: Diagnosis not present

## 2022-07-01 DIAGNOSIS — M6281 Muscle weakness (generalized): Secondary | ICD-10-CM | POA: Diagnosis not present

## 2022-07-01 DIAGNOSIS — U071 COVID-19: Secondary | ICD-10-CM | POA: Diagnosis not present

## 2022-07-01 DIAGNOSIS — R488 Other symbolic dysfunctions: Secondary | ICD-10-CM | POA: Diagnosis not present

## 2022-07-02 DIAGNOSIS — I5032 Chronic diastolic (congestive) heart failure: Secondary | ICD-10-CM | POA: Diagnosis not present

## 2022-07-02 DIAGNOSIS — F0280 Dementia in other diseases classified elsewhere without behavioral disturbance: Secondary | ICD-10-CM | POA: Diagnosis not present

## 2022-07-02 DIAGNOSIS — M6281 Muscle weakness (generalized): Secondary | ICD-10-CM | POA: Diagnosis not present

## 2022-07-02 DIAGNOSIS — U071 COVID-19: Secondary | ICD-10-CM | POA: Diagnosis not present

## 2022-07-02 DIAGNOSIS — R69 Illness, unspecified: Secondary | ICD-10-CM | POA: Diagnosis not present

## 2022-07-02 DIAGNOSIS — R488 Other symbolic dysfunctions: Secondary | ICD-10-CM | POA: Diagnosis not present

## 2022-07-02 DIAGNOSIS — S2241XD Multiple fractures of ribs, right side, subsequent encounter for fracture with routine healing: Secondary | ICD-10-CM | POA: Diagnosis not present

## 2022-07-03 DIAGNOSIS — R69 Illness, unspecified: Secondary | ICD-10-CM | POA: Diagnosis not present

## 2022-07-03 DIAGNOSIS — F0280 Dementia in other diseases classified elsewhere without behavioral disturbance: Secondary | ICD-10-CM | POA: Diagnosis not present

## 2022-07-03 DIAGNOSIS — R488 Other symbolic dysfunctions: Secondary | ICD-10-CM | POA: Diagnosis not present

## 2022-07-03 DIAGNOSIS — M6281 Muscle weakness (generalized): Secondary | ICD-10-CM | POA: Diagnosis not present

## 2022-07-03 DIAGNOSIS — U071 COVID-19: Secondary | ICD-10-CM | POA: Diagnosis not present

## 2022-07-03 DIAGNOSIS — S2241XD Multiple fractures of ribs, right side, subsequent encounter for fracture with routine healing: Secondary | ICD-10-CM | POA: Diagnosis not present

## 2022-07-03 DIAGNOSIS — I5032 Chronic diastolic (congestive) heart failure: Secondary | ICD-10-CM | POA: Diagnosis not present

## 2022-07-04 DIAGNOSIS — U071 COVID-19: Secondary | ICD-10-CM | POA: Diagnosis not present

## 2022-07-04 DIAGNOSIS — F0280 Dementia in other diseases classified elsewhere without behavioral disturbance: Secondary | ICD-10-CM | POA: Diagnosis not present

## 2022-07-04 DIAGNOSIS — R69 Illness, unspecified: Secondary | ICD-10-CM | POA: Diagnosis not present

## 2022-07-04 DIAGNOSIS — R488 Other symbolic dysfunctions: Secondary | ICD-10-CM | POA: Diagnosis not present

## 2022-07-04 DIAGNOSIS — S2241XD Multiple fractures of ribs, right side, subsequent encounter for fracture with routine healing: Secondary | ICD-10-CM | POA: Diagnosis not present

## 2022-07-04 DIAGNOSIS — M6281 Muscle weakness (generalized): Secondary | ICD-10-CM | POA: Diagnosis not present

## 2022-07-04 DIAGNOSIS — I5032 Chronic diastolic (congestive) heart failure: Secondary | ICD-10-CM | POA: Diagnosis not present

## 2022-07-05 DIAGNOSIS — S2241XD Multiple fractures of ribs, right side, subsequent encounter for fracture with routine healing: Secondary | ICD-10-CM | POA: Diagnosis not present

## 2022-07-05 DIAGNOSIS — R69 Illness, unspecified: Secondary | ICD-10-CM | POA: Diagnosis not present

## 2022-07-05 DIAGNOSIS — R488 Other symbolic dysfunctions: Secondary | ICD-10-CM | POA: Diagnosis not present

## 2022-07-05 DIAGNOSIS — F0280 Dementia in other diseases classified elsewhere without behavioral disturbance: Secondary | ICD-10-CM | POA: Diagnosis not present

## 2022-07-05 DIAGNOSIS — U071 COVID-19: Secondary | ICD-10-CM | POA: Diagnosis not present

## 2022-07-05 DIAGNOSIS — M6281 Muscle weakness (generalized): Secondary | ICD-10-CM | POA: Diagnosis not present

## 2022-07-05 DIAGNOSIS — I5032 Chronic diastolic (congestive) heart failure: Secondary | ICD-10-CM | POA: Diagnosis not present

## 2022-07-06 DIAGNOSIS — S2241XD Multiple fractures of ribs, right side, subsequent encounter for fracture with routine healing: Secondary | ICD-10-CM | POA: Diagnosis not present

## 2022-07-06 DIAGNOSIS — R488 Other symbolic dysfunctions: Secondary | ICD-10-CM | POA: Diagnosis not present

## 2022-07-06 DIAGNOSIS — M6281 Muscle weakness (generalized): Secondary | ICD-10-CM | POA: Diagnosis not present

## 2022-07-06 DIAGNOSIS — U071 COVID-19: Secondary | ICD-10-CM | POA: Diagnosis not present

## 2022-07-06 DIAGNOSIS — I5032 Chronic diastolic (congestive) heart failure: Secondary | ICD-10-CM | POA: Diagnosis not present

## 2022-07-06 DIAGNOSIS — R69 Illness, unspecified: Secondary | ICD-10-CM | POA: Diagnosis not present

## 2022-07-06 DIAGNOSIS — F0280 Dementia in other diseases classified elsewhere without behavioral disturbance: Secondary | ICD-10-CM | POA: Diagnosis not present

## 2022-07-07 DIAGNOSIS — S2241XD Multiple fractures of ribs, right side, subsequent encounter for fracture with routine healing: Secondary | ICD-10-CM | POA: Diagnosis not present

## 2022-07-07 DIAGNOSIS — R69 Illness, unspecified: Secondary | ICD-10-CM | POA: Diagnosis not present

## 2022-07-07 DIAGNOSIS — F0280 Dementia in other diseases classified elsewhere without behavioral disturbance: Secondary | ICD-10-CM | POA: Diagnosis not present

## 2022-07-07 DIAGNOSIS — M6281 Muscle weakness (generalized): Secondary | ICD-10-CM | POA: Diagnosis not present

## 2022-07-07 DIAGNOSIS — R488 Other symbolic dysfunctions: Secondary | ICD-10-CM | POA: Diagnosis not present

## 2022-07-07 DIAGNOSIS — U071 COVID-19: Secondary | ICD-10-CM | POA: Diagnosis not present

## 2022-07-07 DIAGNOSIS — I5032 Chronic diastolic (congestive) heart failure: Secondary | ICD-10-CM | POA: Diagnosis not present

## 2022-07-08 DIAGNOSIS — I5032 Chronic diastolic (congestive) heart failure: Secondary | ICD-10-CM | POA: Diagnosis not present

## 2022-07-08 DIAGNOSIS — S2241XD Multiple fractures of ribs, right side, subsequent encounter for fracture with routine healing: Secondary | ICD-10-CM | POA: Diagnosis not present

## 2022-07-08 DIAGNOSIS — U071 COVID-19: Secondary | ICD-10-CM | POA: Diagnosis not present

## 2022-07-08 DIAGNOSIS — R69 Illness, unspecified: Secondary | ICD-10-CM | POA: Diagnosis not present

## 2022-07-08 DIAGNOSIS — R488 Other symbolic dysfunctions: Secondary | ICD-10-CM | POA: Diagnosis not present

## 2022-07-08 DIAGNOSIS — F0280 Dementia in other diseases classified elsewhere without behavioral disturbance: Secondary | ICD-10-CM | POA: Diagnosis not present

## 2022-07-08 DIAGNOSIS — M6281 Muscle weakness (generalized): Secondary | ICD-10-CM | POA: Diagnosis not present

## 2022-07-09 DIAGNOSIS — I5032 Chronic diastolic (congestive) heart failure: Secondary | ICD-10-CM | POA: Diagnosis not present

## 2022-07-09 DIAGNOSIS — R488 Other symbolic dysfunctions: Secondary | ICD-10-CM | POA: Diagnosis not present

## 2022-07-09 DIAGNOSIS — S2241XD Multiple fractures of ribs, right side, subsequent encounter for fracture with routine healing: Secondary | ICD-10-CM | POA: Diagnosis not present

## 2022-07-09 DIAGNOSIS — R69 Illness, unspecified: Secondary | ICD-10-CM | POA: Diagnosis not present

## 2022-07-09 DIAGNOSIS — F0280 Dementia in other diseases classified elsewhere without behavioral disturbance: Secondary | ICD-10-CM | POA: Diagnosis not present

## 2022-07-09 DIAGNOSIS — M6281 Muscle weakness (generalized): Secondary | ICD-10-CM | POA: Diagnosis not present

## 2022-07-09 DIAGNOSIS — U071 COVID-19: Secondary | ICD-10-CM | POA: Diagnosis not present

## 2022-07-10 DIAGNOSIS — R69 Illness, unspecified: Secondary | ICD-10-CM | POA: Diagnosis not present

## 2022-07-10 DIAGNOSIS — F0280 Dementia in other diseases classified elsewhere without behavioral disturbance: Secondary | ICD-10-CM | POA: Diagnosis not present

## 2022-07-10 DIAGNOSIS — U071 COVID-19: Secondary | ICD-10-CM | POA: Diagnosis not present

## 2022-07-10 DIAGNOSIS — R488 Other symbolic dysfunctions: Secondary | ICD-10-CM | POA: Diagnosis not present

## 2022-07-10 DIAGNOSIS — M6281 Muscle weakness (generalized): Secondary | ICD-10-CM | POA: Diagnosis not present

## 2022-07-10 DIAGNOSIS — S2241XD Multiple fractures of ribs, right side, subsequent encounter for fracture with routine healing: Secondary | ICD-10-CM | POA: Diagnosis not present

## 2022-07-10 DIAGNOSIS — I5032 Chronic diastolic (congestive) heart failure: Secondary | ICD-10-CM | POA: Diagnosis not present

## 2022-07-11 DIAGNOSIS — I5032 Chronic diastolic (congestive) heart failure: Secondary | ICD-10-CM | POA: Diagnosis not present

## 2022-07-11 DIAGNOSIS — U071 COVID-19: Secondary | ICD-10-CM | POA: Diagnosis not present

## 2022-07-11 DIAGNOSIS — F0280 Dementia in other diseases classified elsewhere without behavioral disturbance: Secondary | ICD-10-CM | POA: Diagnosis not present

## 2022-07-11 DIAGNOSIS — F32A Depression, unspecified: Secondary | ICD-10-CM | POA: Diagnosis not present

## 2022-07-11 DIAGNOSIS — R69 Illness, unspecified: Secondary | ICD-10-CM | POA: Diagnosis not present

## 2022-07-11 DIAGNOSIS — R488 Other symbolic dysfunctions: Secondary | ICD-10-CM | POA: Diagnosis not present

## 2022-07-11 DIAGNOSIS — S2241XD Multiple fractures of ribs, right side, subsequent encounter for fracture with routine healing: Secondary | ICD-10-CM | POA: Diagnosis not present

## 2022-07-11 DIAGNOSIS — M6281 Muscle weakness (generalized): Secondary | ICD-10-CM | POA: Diagnosis not present

## 2022-07-12 DIAGNOSIS — F0280 Dementia in other diseases classified elsewhere without behavioral disturbance: Secondary | ICD-10-CM | POA: Diagnosis not present

## 2022-07-12 DIAGNOSIS — R69 Illness, unspecified: Secondary | ICD-10-CM | POA: Diagnosis not present

## 2022-07-12 DIAGNOSIS — R488 Other symbolic dysfunctions: Secondary | ICD-10-CM | POA: Diagnosis not present

## 2022-07-12 DIAGNOSIS — I5032 Chronic diastolic (congestive) heart failure: Secondary | ICD-10-CM | POA: Diagnosis not present

## 2022-07-12 DIAGNOSIS — U071 COVID-19: Secondary | ICD-10-CM | POA: Diagnosis not present

## 2022-07-12 DIAGNOSIS — S2241XD Multiple fractures of ribs, right side, subsequent encounter for fracture with routine healing: Secondary | ICD-10-CM | POA: Diagnosis not present

## 2022-07-12 DIAGNOSIS — M6281 Muscle weakness (generalized): Secondary | ICD-10-CM | POA: Diagnosis not present

## 2022-07-14 DIAGNOSIS — I5032 Chronic diastolic (congestive) heart failure: Secondary | ICD-10-CM | POA: Diagnosis not present

## 2022-07-14 DIAGNOSIS — R488 Other symbolic dysfunctions: Secondary | ICD-10-CM | POA: Diagnosis not present

## 2022-07-14 DIAGNOSIS — S2241XD Multiple fractures of ribs, right side, subsequent encounter for fracture with routine healing: Secondary | ICD-10-CM | POA: Diagnosis not present

## 2022-07-14 DIAGNOSIS — F0280 Dementia in other diseases classified elsewhere without behavioral disturbance: Secondary | ICD-10-CM | POA: Diagnosis not present

## 2022-07-14 DIAGNOSIS — U071 COVID-19: Secondary | ICD-10-CM | POA: Diagnosis not present

## 2022-07-14 DIAGNOSIS — R69 Illness, unspecified: Secondary | ICD-10-CM | POA: Diagnosis not present

## 2022-07-14 DIAGNOSIS — M6281 Muscle weakness (generalized): Secondary | ICD-10-CM | POA: Diagnosis not present

## 2022-07-15 DIAGNOSIS — R69 Illness, unspecified: Secondary | ICD-10-CM | POA: Diagnosis not present

## 2022-07-15 DIAGNOSIS — M6281 Muscle weakness (generalized): Secondary | ICD-10-CM | POA: Diagnosis not present

## 2022-07-15 DIAGNOSIS — F0280 Dementia in other diseases classified elsewhere without behavioral disturbance: Secondary | ICD-10-CM | POA: Diagnosis not present

## 2022-07-15 DIAGNOSIS — S2241XD Multiple fractures of ribs, right side, subsequent encounter for fracture with routine healing: Secondary | ICD-10-CM | POA: Diagnosis not present

## 2022-07-15 DIAGNOSIS — U071 COVID-19: Secondary | ICD-10-CM | POA: Diagnosis not present

## 2022-07-15 DIAGNOSIS — R488 Other symbolic dysfunctions: Secondary | ICD-10-CM | POA: Diagnosis not present

## 2022-07-15 DIAGNOSIS — I5032 Chronic diastolic (congestive) heart failure: Secondary | ICD-10-CM | POA: Diagnosis not present

## 2022-07-16 DIAGNOSIS — F0280 Dementia in other diseases classified elsewhere without behavioral disturbance: Secondary | ICD-10-CM | POA: Diagnosis not present

## 2022-07-16 DIAGNOSIS — M6281 Muscle weakness (generalized): Secondary | ICD-10-CM | POA: Diagnosis not present

## 2022-07-16 DIAGNOSIS — I5032 Chronic diastolic (congestive) heart failure: Secondary | ICD-10-CM | POA: Diagnosis not present

## 2022-07-16 DIAGNOSIS — R488 Other symbolic dysfunctions: Secondary | ICD-10-CM | POA: Diagnosis not present

## 2022-07-16 DIAGNOSIS — U071 COVID-19: Secondary | ICD-10-CM | POA: Diagnosis not present

## 2022-07-16 DIAGNOSIS — R69 Illness, unspecified: Secondary | ICD-10-CM | POA: Diagnosis not present

## 2022-07-16 DIAGNOSIS — S2241XD Multiple fractures of ribs, right side, subsequent encounter for fracture with routine healing: Secondary | ICD-10-CM | POA: Diagnosis not present

## 2022-07-17 DIAGNOSIS — U071 COVID-19: Secondary | ICD-10-CM | POA: Diagnosis not present

## 2022-07-17 DIAGNOSIS — I5032 Chronic diastolic (congestive) heart failure: Secondary | ICD-10-CM | POA: Diagnosis not present

## 2022-07-17 DIAGNOSIS — R488 Other symbolic dysfunctions: Secondary | ICD-10-CM | POA: Diagnosis not present

## 2022-07-17 DIAGNOSIS — F0280 Dementia in other diseases classified elsewhere without behavioral disturbance: Secondary | ICD-10-CM | POA: Diagnosis not present

## 2022-07-17 DIAGNOSIS — S2241XD Multiple fractures of ribs, right side, subsequent encounter for fracture with routine healing: Secondary | ICD-10-CM | POA: Diagnosis not present

## 2022-07-17 DIAGNOSIS — R69 Illness, unspecified: Secondary | ICD-10-CM | POA: Diagnosis not present

## 2022-07-17 DIAGNOSIS — M6281 Muscle weakness (generalized): Secondary | ICD-10-CM | POA: Diagnosis not present

## 2022-07-18 DIAGNOSIS — R488 Other symbolic dysfunctions: Secondary | ICD-10-CM | POA: Diagnosis not present

## 2022-07-18 DIAGNOSIS — U071 COVID-19: Secondary | ICD-10-CM | POA: Diagnosis not present

## 2022-07-18 DIAGNOSIS — R69 Illness, unspecified: Secondary | ICD-10-CM | POA: Diagnosis not present

## 2022-07-18 DIAGNOSIS — F0280 Dementia in other diseases classified elsewhere without behavioral disturbance: Secondary | ICD-10-CM | POA: Diagnosis not present

## 2022-07-18 DIAGNOSIS — S2241XD Multiple fractures of ribs, right side, subsequent encounter for fracture with routine healing: Secondary | ICD-10-CM | POA: Diagnosis not present

## 2022-07-18 DIAGNOSIS — I5032 Chronic diastolic (congestive) heart failure: Secondary | ICD-10-CM | POA: Diagnosis not present

## 2022-07-18 DIAGNOSIS — M6281 Muscle weakness (generalized): Secondary | ICD-10-CM | POA: Diagnosis not present

## 2022-07-19 DIAGNOSIS — I5032 Chronic diastolic (congestive) heart failure: Secondary | ICD-10-CM | POA: Diagnosis not present

## 2022-07-19 DIAGNOSIS — M6281 Muscle weakness (generalized): Secondary | ICD-10-CM | POA: Diagnosis not present

## 2022-07-19 DIAGNOSIS — U071 COVID-19: Secondary | ICD-10-CM | POA: Diagnosis not present

## 2022-07-19 DIAGNOSIS — F0280 Dementia in other diseases classified elsewhere without behavioral disturbance: Secondary | ICD-10-CM | POA: Diagnosis not present

## 2022-07-19 DIAGNOSIS — R69 Illness, unspecified: Secondary | ICD-10-CM | POA: Diagnosis not present

## 2022-07-19 DIAGNOSIS — S2241XD Multiple fractures of ribs, right side, subsequent encounter for fracture with routine healing: Secondary | ICD-10-CM | POA: Diagnosis not present

## 2022-07-19 DIAGNOSIS — R488 Other symbolic dysfunctions: Secondary | ICD-10-CM | POA: Diagnosis not present

## 2022-07-21 DIAGNOSIS — R69 Illness, unspecified: Secondary | ICD-10-CM | POA: Diagnosis not present

## 2022-07-21 DIAGNOSIS — U071 COVID-19: Secondary | ICD-10-CM | POA: Diagnosis not present

## 2022-07-21 DIAGNOSIS — F0280 Dementia in other diseases classified elsewhere without behavioral disturbance: Secondary | ICD-10-CM | POA: Diagnosis not present

## 2022-07-21 DIAGNOSIS — M6281 Muscle weakness (generalized): Secondary | ICD-10-CM | POA: Diagnosis not present

## 2022-07-21 DIAGNOSIS — I5032 Chronic diastolic (congestive) heart failure: Secondary | ICD-10-CM | POA: Diagnosis not present

## 2022-07-21 DIAGNOSIS — S2241XD Multiple fractures of ribs, right side, subsequent encounter for fracture with routine healing: Secondary | ICD-10-CM | POA: Diagnosis not present

## 2022-07-21 DIAGNOSIS — R488 Other symbolic dysfunctions: Secondary | ICD-10-CM | POA: Diagnosis not present

## 2022-07-22 DIAGNOSIS — F0280 Dementia in other diseases classified elsewhere without behavioral disturbance: Secondary | ICD-10-CM | POA: Diagnosis not present

## 2022-07-22 DIAGNOSIS — R488 Other symbolic dysfunctions: Secondary | ICD-10-CM | POA: Diagnosis not present

## 2022-07-22 DIAGNOSIS — R69 Illness, unspecified: Secondary | ICD-10-CM | POA: Diagnosis not present

## 2022-07-22 DIAGNOSIS — M6281 Muscle weakness (generalized): Secondary | ICD-10-CM | POA: Diagnosis not present

## 2022-07-22 DIAGNOSIS — S2241XD Multiple fractures of ribs, right side, subsequent encounter for fracture with routine healing: Secondary | ICD-10-CM | POA: Diagnosis not present

## 2022-07-22 DIAGNOSIS — U071 COVID-19: Secondary | ICD-10-CM | POA: Diagnosis not present

## 2022-07-22 DIAGNOSIS — I5032 Chronic diastolic (congestive) heart failure: Secondary | ICD-10-CM | POA: Diagnosis not present

## 2022-07-23 DIAGNOSIS — F0280 Dementia in other diseases classified elsewhere without behavioral disturbance: Secondary | ICD-10-CM | POA: Diagnosis not present

## 2022-07-23 DIAGNOSIS — S2241XD Multiple fractures of ribs, right side, subsequent encounter for fracture with routine healing: Secondary | ICD-10-CM | POA: Diagnosis not present

## 2022-07-23 DIAGNOSIS — U071 COVID-19: Secondary | ICD-10-CM | POA: Diagnosis not present

## 2022-07-23 DIAGNOSIS — R69 Illness, unspecified: Secondary | ICD-10-CM | POA: Diagnosis not present

## 2022-07-23 DIAGNOSIS — M6281 Muscle weakness (generalized): Secondary | ICD-10-CM | POA: Diagnosis not present

## 2022-07-23 DIAGNOSIS — R488 Other symbolic dysfunctions: Secondary | ICD-10-CM | POA: Diagnosis not present

## 2022-07-23 DIAGNOSIS — I5032 Chronic diastolic (congestive) heart failure: Secondary | ICD-10-CM | POA: Diagnosis not present

## 2022-07-24 DIAGNOSIS — M6281 Muscle weakness (generalized): Secondary | ICD-10-CM | POA: Diagnosis not present

## 2022-07-24 DIAGNOSIS — I10 Essential (primary) hypertension: Secondary | ICD-10-CM | POA: Diagnosis not present

## 2022-07-24 DIAGNOSIS — S2241XD Multiple fractures of ribs, right side, subsequent encounter for fracture with routine healing: Secondary | ICD-10-CM | POA: Diagnosis not present

## 2022-07-24 DIAGNOSIS — I5032 Chronic diastolic (congestive) heart failure: Secondary | ICD-10-CM | POA: Diagnosis not present

## 2022-07-24 DIAGNOSIS — R488 Other symbolic dysfunctions: Secondary | ICD-10-CM | POA: Diagnosis not present

## 2022-07-24 DIAGNOSIS — U071 COVID-19: Secondary | ICD-10-CM | POA: Diagnosis not present

## 2022-07-24 DIAGNOSIS — R69 Illness, unspecified: Secondary | ICD-10-CM | POA: Diagnosis not present

## 2022-07-24 DIAGNOSIS — F0280 Dementia in other diseases classified elsewhere without behavioral disturbance: Secondary | ICD-10-CM | POA: Diagnosis not present

## 2022-07-25 DIAGNOSIS — M6281 Muscle weakness (generalized): Secondary | ICD-10-CM | POA: Diagnosis not present

## 2022-07-25 DIAGNOSIS — S2241XD Multiple fractures of ribs, right side, subsequent encounter for fracture with routine healing: Secondary | ICD-10-CM | POA: Diagnosis not present

## 2022-07-25 DIAGNOSIS — I5032 Chronic diastolic (congestive) heart failure: Secondary | ICD-10-CM | POA: Diagnosis not present

## 2022-07-25 DIAGNOSIS — R488 Other symbolic dysfunctions: Secondary | ICD-10-CM | POA: Diagnosis not present

## 2022-07-25 DIAGNOSIS — U071 COVID-19: Secondary | ICD-10-CM | POA: Diagnosis not present

## 2022-07-25 DIAGNOSIS — F0280 Dementia in other diseases classified elsewhere without behavioral disturbance: Secondary | ICD-10-CM | POA: Diagnosis not present

## 2022-07-25 DIAGNOSIS — R69 Illness, unspecified: Secondary | ICD-10-CM | POA: Diagnosis not present

## 2022-07-26 DIAGNOSIS — R488 Other symbolic dysfunctions: Secondary | ICD-10-CM | POA: Diagnosis not present

## 2022-07-26 DIAGNOSIS — R69 Illness, unspecified: Secondary | ICD-10-CM | POA: Diagnosis not present

## 2022-07-26 DIAGNOSIS — I5032 Chronic diastolic (congestive) heart failure: Secondary | ICD-10-CM | POA: Diagnosis not present

## 2022-07-26 DIAGNOSIS — M6281 Muscle weakness (generalized): Secondary | ICD-10-CM | POA: Diagnosis not present

## 2022-07-26 DIAGNOSIS — S2241XD Multiple fractures of ribs, right side, subsequent encounter for fracture with routine healing: Secondary | ICD-10-CM | POA: Diagnosis not present

## 2022-07-26 DIAGNOSIS — F0280 Dementia in other diseases classified elsewhere without behavioral disturbance: Secondary | ICD-10-CM | POA: Diagnosis not present

## 2022-07-26 DIAGNOSIS — U071 COVID-19: Secondary | ICD-10-CM | POA: Diagnosis not present

## 2022-07-29 DIAGNOSIS — R488 Other symbolic dysfunctions: Secondary | ICD-10-CM | POA: Diagnosis not present

## 2022-07-29 DIAGNOSIS — I5032 Chronic diastolic (congestive) heart failure: Secondary | ICD-10-CM | POA: Diagnosis not present

## 2022-07-29 DIAGNOSIS — U071 COVID-19: Secondary | ICD-10-CM | POA: Diagnosis not present

## 2022-07-29 DIAGNOSIS — F0280 Dementia in other diseases classified elsewhere without behavioral disturbance: Secondary | ICD-10-CM | POA: Diagnosis not present

## 2022-07-29 DIAGNOSIS — R69 Illness, unspecified: Secondary | ICD-10-CM | POA: Diagnosis not present

## 2022-07-29 DIAGNOSIS — M6281 Muscle weakness (generalized): Secondary | ICD-10-CM | POA: Diagnosis not present

## 2022-07-29 DIAGNOSIS — S2241XD Multiple fractures of ribs, right side, subsequent encounter for fracture with routine healing: Secondary | ICD-10-CM | POA: Diagnosis not present

## 2022-07-30 DIAGNOSIS — R488 Other symbolic dysfunctions: Secondary | ICD-10-CM | POA: Diagnosis not present

## 2022-07-30 DIAGNOSIS — F0280 Dementia in other diseases classified elsewhere without behavioral disturbance: Secondary | ICD-10-CM | POA: Diagnosis not present

## 2022-07-30 DIAGNOSIS — U071 COVID-19: Secondary | ICD-10-CM | POA: Diagnosis not present

## 2022-07-30 DIAGNOSIS — S2241XD Multiple fractures of ribs, right side, subsequent encounter for fracture with routine healing: Secondary | ICD-10-CM | POA: Diagnosis not present

## 2022-07-30 DIAGNOSIS — I5032 Chronic diastolic (congestive) heart failure: Secondary | ICD-10-CM | POA: Diagnosis not present

## 2022-07-30 DIAGNOSIS — M6281 Muscle weakness (generalized): Secondary | ICD-10-CM | POA: Diagnosis not present

## 2022-07-30 DIAGNOSIS — R69 Illness, unspecified: Secondary | ICD-10-CM | POA: Diagnosis not present

## 2022-07-31 DIAGNOSIS — F0280 Dementia in other diseases classified elsewhere without behavioral disturbance: Secondary | ICD-10-CM | POA: Diagnosis not present

## 2022-07-31 DIAGNOSIS — I5032 Chronic diastolic (congestive) heart failure: Secondary | ICD-10-CM | POA: Diagnosis not present

## 2022-07-31 DIAGNOSIS — R69 Illness, unspecified: Secondary | ICD-10-CM | POA: Diagnosis not present

## 2022-07-31 DIAGNOSIS — M6281 Muscle weakness (generalized): Secondary | ICD-10-CM | POA: Diagnosis not present

## 2022-07-31 DIAGNOSIS — R488 Other symbolic dysfunctions: Secondary | ICD-10-CM | POA: Diagnosis not present

## 2022-07-31 DIAGNOSIS — U071 COVID-19: Secondary | ICD-10-CM | POA: Diagnosis not present

## 2022-07-31 DIAGNOSIS — S2241XD Multiple fractures of ribs, right side, subsequent encounter for fracture with routine healing: Secondary | ICD-10-CM | POA: Diagnosis not present

## 2022-08-01 DIAGNOSIS — I5032 Chronic diastolic (congestive) heart failure: Secondary | ICD-10-CM | POA: Diagnosis not present

## 2022-08-01 DIAGNOSIS — R488 Other symbolic dysfunctions: Secondary | ICD-10-CM | POA: Diagnosis not present

## 2022-08-01 DIAGNOSIS — M6281 Muscle weakness (generalized): Secondary | ICD-10-CM | POA: Diagnosis not present

## 2022-08-01 DIAGNOSIS — R69 Illness, unspecified: Secondary | ICD-10-CM | POA: Diagnosis not present

## 2022-08-01 DIAGNOSIS — S2241XD Multiple fractures of ribs, right side, subsequent encounter for fracture with routine healing: Secondary | ICD-10-CM | POA: Diagnosis not present

## 2022-08-01 DIAGNOSIS — U071 COVID-19: Secondary | ICD-10-CM | POA: Diagnosis not present

## 2022-08-01 DIAGNOSIS — F0280 Dementia in other diseases classified elsewhere without behavioral disturbance: Secondary | ICD-10-CM | POA: Diagnosis not present

## 2022-08-02 DIAGNOSIS — S2241XD Multiple fractures of ribs, right side, subsequent encounter for fracture with routine healing: Secondary | ICD-10-CM | POA: Diagnosis not present

## 2022-08-02 DIAGNOSIS — R69 Illness, unspecified: Secondary | ICD-10-CM | POA: Diagnosis not present

## 2022-08-02 DIAGNOSIS — R488 Other symbolic dysfunctions: Secondary | ICD-10-CM | POA: Diagnosis not present

## 2022-08-02 DIAGNOSIS — U071 COVID-19: Secondary | ICD-10-CM | POA: Diagnosis not present

## 2022-08-02 DIAGNOSIS — M6281 Muscle weakness (generalized): Secondary | ICD-10-CM | POA: Diagnosis not present

## 2022-08-02 DIAGNOSIS — I5032 Chronic diastolic (congestive) heart failure: Secondary | ICD-10-CM | POA: Diagnosis not present

## 2022-08-02 DIAGNOSIS — F0280 Dementia in other diseases classified elsewhere without behavioral disturbance: Secondary | ICD-10-CM | POA: Diagnosis not present

## 2022-08-03 DIAGNOSIS — I5032 Chronic diastolic (congestive) heart failure: Secondary | ICD-10-CM | POA: Diagnosis not present

## 2022-08-03 DIAGNOSIS — M6281 Muscle weakness (generalized): Secondary | ICD-10-CM | POA: Diagnosis not present

## 2022-08-03 DIAGNOSIS — F0280 Dementia in other diseases classified elsewhere without behavioral disturbance: Secondary | ICD-10-CM | POA: Diagnosis not present

## 2022-08-03 DIAGNOSIS — U071 COVID-19: Secondary | ICD-10-CM | POA: Diagnosis not present

## 2022-08-03 DIAGNOSIS — R69 Illness, unspecified: Secondary | ICD-10-CM | POA: Diagnosis not present

## 2022-08-03 DIAGNOSIS — R488 Other symbolic dysfunctions: Secondary | ICD-10-CM | POA: Diagnosis not present

## 2022-08-03 DIAGNOSIS — S2241XD Multiple fractures of ribs, right side, subsequent encounter for fracture with routine healing: Secondary | ICD-10-CM | POA: Diagnosis not present

## 2022-08-04 DIAGNOSIS — M6281 Muscle weakness (generalized): Secondary | ICD-10-CM | POA: Diagnosis not present

## 2022-08-04 DIAGNOSIS — U071 COVID-19: Secondary | ICD-10-CM | POA: Diagnosis not present

## 2022-08-04 DIAGNOSIS — I5032 Chronic diastolic (congestive) heart failure: Secondary | ICD-10-CM | POA: Diagnosis not present

## 2022-08-04 DIAGNOSIS — S2241XD Multiple fractures of ribs, right side, subsequent encounter for fracture with routine healing: Secondary | ICD-10-CM | POA: Diagnosis not present

## 2022-08-04 DIAGNOSIS — R69 Illness, unspecified: Secondary | ICD-10-CM | POA: Diagnosis not present

## 2022-08-04 DIAGNOSIS — R488 Other symbolic dysfunctions: Secondary | ICD-10-CM | POA: Diagnosis not present

## 2022-08-04 DIAGNOSIS — F0280 Dementia in other diseases classified elsewhere without behavioral disturbance: Secondary | ICD-10-CM | POA: Diagnosis not present

## 2022-08-05 DIAGNOSIS — S2241XD Multiple fractures of ribs, right side, subsequent encounter for fracture with routine healing: Secondary | ICD-10-CM | POA: Diagnosis not present

## 2022-08-05 DIAGNOSIS — U071 COVID-19: Secondary | ICD-10-CM | POA: Diagnosis not present

## 2022-08-05 DIAGNOSIS — M6281 Muscle weakness (generalized): Secondary | ICD-10-CM | POA: Diagnosis not present

## 2022-08-05 DIAGNOSIS — F0280 Dementia in other diseases classified elsewhere without behavioral disturbance: Secondary | ICD-10-CM | POA: Diagnosis not present

## 2022-08-05 DIAGNOSIS — R488 Other symbolic dysfunctions: Secondary | ICD-10-CM | POA: Diagnosis not present

## 2022-08-05 DIAGNOSIS — I5032 Chronic diastolic (congestive) heart failure: Secondary | ICD-10-CM | POA: Diagnosis not present

## 2022-08-05 DIAGNOSIS — R69 Illness, unspecified: Secondary | ICD-10-CM | POA: Diagnosis not present

## 2022-08-06 DIAGNOSIS — M6281 Muscle weakness (generalized): Secondary | ICD-10-CM | POA: Diagnosis not present

## 2022-08-06 DIAGNOSIS — G301 Alzheimer's disease with late onset: Secondary | ICD-10-CM | POA: Diagnosis not present

## 2022-08-06 DIAGNOSIS — F0280 Dementia in other diseases classified elsewhere without behavioral disturbance: Secondary | ICD-10-CM | POA: Diagnosis not present

## 2022-08-06 DIAGNOSIS — F331 Major depressive disorder, recurrent, moderate: Secondary | ICD-10-CM | POA: Diagnosis not present

## 2022-08-06 DIAGNOSIS — F02B4 Dementia in other diseases classified elsewhere, moderate, with anxiety: Secondary | ICD-10-CM | POA: Diagnosis not present

## 2022-08-06 DIAGNOSIS — I5032 Chronic diastolic (congestive) heart failure: Secondary | ICD-10-CM | POA: Diagnosis not present

## 2022-08-06 DIAGNOSIS — I10 Essential (primary) hypertension: Secondary | ICD-10-CM | POA: Diagnosis not present

## 2022-08-06 DIAGNOSIS — U071 COVID-19: Secondary | ICD-10-CM | POA: Diagnosis not present

## 2022-08-06 DIAGNOSIS — S2241XD Multiple fractures of ribs, right side, subsequent encounter for fracture with routine healing: Secondary | ICD-10-CM | POA: Diagnosis not present

## 2022-08-06 DIAGNOSIS — R488 Other symbolic dysfunctions: Secondary | ICD-10-CM | POA: Diagnosis not present

## 2022-08-06 DIAGNOSIS — R69 Illness, unspecified: Secondary | ICD-10-CM | POA: Diagnosis not present

## 2022-08-06 DIAGNOSIS — F413 Other mixed anxiety disorders: Secondary | ICD-10-CM | POA: Diagnosis not present

## 2022-08-08 DIAGNOSIS — R488 Other symbolic dysfunctions: Secondary | ICD-10-CM | POA: Diagnosis not present

## 2022-08-08 DIAGNOSIS — U071 COVID-19: Secondary | ICD-10-CM | POA: Diagnosis not present

## 2022-08-08 DIAGNOSIS — F0280 Dementia in other diseases classified elsewhere without behavioral disturbance: Secondary | ICD-10-CM | POA: Diagnosis not present

## 2022-08-08 DIAGNOSIS — M6281 Muscle weakness (generalized): Secondary | ICD-10-CM | POA: Diagnosis not present

## 2022-08-08 DIAGNOSIS — R69 Illness, unspecified: Secondary | ICD-10-CM | POA: Diagnosis not present

## 2022-08-08 DIAGNOSIS — S2241XD Multiple fractures of ribs, right side, subsequent encounter for fracture with routine healing: Secondary | ICD-10-CM | POA: Diagnosis not present

## 2022-08-08 DIAGNOSIS — I5032 Chronic diastolic (congestive) heart failure: Secondary | ICD-10-CM | POA: Diagnosis not present

## 2022-08-09 DIAGNOSIS — M6281 Muscle weakness (generalized): Secondary | ICD-10-CM | POA: Diagnosis not present

## 2022-08-09 DIAGNOSIS — U071 COVID-19: Secondary | ICD-10-CM | POA: Diagnosis not present

## 2022-08-09 DIAGNOSIS — I5032 Chronic diastolic (congestive) heart failure: Secondary | ICD-10-CM | POA: Diagnosis not present

## 2022-08-09 DIAGNOSIS — R488 Other symbolic dysfunctions: Secondary | ICD-10-CM | POA: Diagnosis not present

## 2022-08-09 DIAGNOSIS — S2241XD Multiple fractures of ribs, right side, subsequent encounter for fracture with routine healing: Secondary | ICD-10-CM | POA: Diagnosis not present

## 2022-08-09 DIAGNOSIS — F0280 Dementia in other diseases classified elsewhere without behavioral disturbance: Secondary | ICD-10-CM | POA: Diagnosis not present

## 2022-08-11 DIAGNOSIS — F0280 Dementia in other diseases classified elsewhere without behavioral disturbance: Secondary | ICD-10-CM | POA: Diagnosis not present

## 2022-08-11 DIAGNOSIS — S2241XD Multiple fractures of ribs, right side, subsequent encounter for fracture with routine healing: Secondary | ICD-10-CM | POA: Diagnosis not present

## 2022-08-11 DIAGNOSIS — M6281 Muscle weakness (generalized): Secondary | ICD-10-CM | POA: Diagnosis not present

## 2022-08-11 DIAGNOSIS — R488 Other symbolic dysfunctions: Secondary | ICD-10-CM | POA: Diagnosis not present

## 2022-08-11 DIAGNOSIS — I5032 Chronic diastolic (congestive) heart failure: Secondary | ICD-10-CM | POA: Diagnosis not present

## 2022-08-11 DIAGNOSIS — U071 COVID-19: Secondary | ICD-10-CM | POA: Diagnosis not present

## 2022-08-12 DIAGNOSIS — N390 Urinary tract infection, site not specified: Secondary | ICD-10-CM | POA: Diagnosis not present

## 2022-08-12 DIAGNOSIS — R509 Fever, unspecified: Secondary | ICD-10-CM | POA: Diagnosis not present

## 2022-08-13 DIAGNOSIS — I5032 Chronic diastolic (congestive) heart failure: Secondary | ICD-10-CM | POA: Diagnosis not present

## 2022-08-13 DIAGNOSIS — R488 Other symbolic dysfunctions: Secondary | ICD-10-CM | POA: Diagnosis not present

## 2022-08-13 DIAGNOSIS — F0280 Dementia in other diseases classified elsewhere without behavioral disturbance: Secondary | ICD-10-CM | POA: Diagnosis not present

## 2022-08-13 DIAGNOSIS — M6281 Muscle weakness (generalized): Secondary | ICD-10-CM | POA: Diagnosis not present

## 2022-08-13 DIAGNOSIS — S2241XD Multiple fractures of ribs, right side, subsequent encounter for fracture with routine healing: Secondary | ICD-10-CM | POA: Diagnosis not present

## 2022-08-13 DIAGNOSIS — U071 COVID-19: Secondary | ICD-10-CM | POA: Diagnosis not present

## 2022-08-14 DIAGNOSIS — J101 Influenza due to other identified influenza virus with other respiratory manifestations: Secondary | ICD-10-CM | POA: Diagnosis not present

## 2022-08-14 DIAGNOSIS — R488 Other symbolic dysfunctions: Secondary | ICD-10-CM | POA: Diagnosis not present

## 2022-08-14 DIAGNOSIS — M6281 Muscle weakness (generalized): Secondary | ICD-10-CM | POA: Diagnosis not present

## 2022-08-14 DIAGNOSIS — F0280 Dementia in other diseases classified elsewhere without behavioral disturbance: Secondary | ICD-10-CM | POA: Diagnosis not present

## 2022-08-14 DIAGNOSIS — I5032 Chronic diastolic (congestive) heart failure: Secondary | ICD-10-CM | POA: Diagnosis not present

## 2022-08-14 DIAGNOSIS — S2241XD Multiple fractures of ribs, right side, subsequent encounter for fracture with routine healing: Secondary | ICD-10-CM | POA: Diagnosis not present

## 2022-08-14 DIAGNOSIS — N390 Urinary tract infection, site not specified: Secondary | ICD-10-CM | POA: Diagnosis not present

## 2022-08-14 DIAGNOSIS — U071 COVID-19: Secondary | ICD-10-CM | POA: Diagnosis not present

## 2022-08-15 DIAGNOSIS — U071 COVID-19: Secondary | ICD-10-CM | POA: Diagnosis not present

## 2022-08-15 DIAGNOSIS — S2241XD Multiple fractures of ribs, right side, subsequent encounter for fracture with routine healing: Secondary | ICD-10-CM | POA: Diagnosis not present

## 2022-08-15 DIAGNOSIS — I5032 Chronic diastolic (congestive) heart failure: Secondary | ICD-10-CM | POA: Diagnosis not present

## 2022-08-15 DIAGNOSIS — F0280 Dementia in other diseases classified elsewhere without behavioral disturbance: Secondary | ICD-10-CM | POA: Diagnosis not present

## 2022-08-15 DIAGNOSIS — R488 Other symbolic dysfunctions: Secondary | ICD-10-CM | POA: Diagnosis not present

## 2022-08-15 DIAGNOSIS — M6281 Muscle weakness (generalized): Secondary | ICD-10-CM | POA: Diagnosis not present

## 2022-08-16 DIAGNOSIS — M6281 Muscle weakness (generalized): Secondary | ICD-10-CM | POA: Diagnosis not present

## 2022-08-16 DIAGNOSIS — I5032 Chronic diastolic (congestive) heart failure: Secondary | ICD-10-CM | POA: Diagnosis not present

## 2022-08-16 DIAGNOSIS — R488 Other symbolic dysfunctions: Secondary | ICD-10-CM | POA: Diagnosis not present

## 2022-08-16 DIAGNOSIS — U071 COVID-19: Secondary | ICD-10-CM | POA: Diagnosis not present

## 2022-08-16 DIAGNOSIS — S2241XD Multiple fractures of ribs, right side, subsequent encounter for fracture with routine healing: Secondary | ICD-10-CM | POA: Diagnosis not present

## 2022-08-16 DIAGNOSIS — F0280 Dementia in other diseases classified elsewhere without behavioral disturbance: Secondary | ICD-10-CM | POA: Diagnosis not present

## 2022-08-20 DIAGNOSIS — I7091 Generalized atherosclerosis: Secondary | ICD-10-CM | POA: Diagnosis not present

## 2022-08-20 DIAGNOSIS — N390 Urinary tract infection, site not specified: Secondary | ICD-10-CM | POA: Diagnosis not present

## 2022-08-22 DIAGNOSIS — N390 Urinary tract infection, site not specified: Secondary | ICD-10-CM | POA: Diagnosis not present

## 2022-09-05 DIAGNOSIS — I5032 Chronic diastolic (congestive) heart failure: Secondary | ICD-10-CM | POA: Diagnosis not present

## 2022-09-05 DIAGNOSIS — R3981 Functional urinary incontinence: Secondary | ICD-10-CM | POA: Diagnosis not present

## 2022-09-05 DIAGNOSIS — Z7409 Other reduced mobility: Secondary | ICD-10-CM | POA: Diagnosis not present

## 2022-09-05 DIAGNOSIS — I10 Essential (primary) hypertension: Secondary | ICD-10-CM | POA: Diagnosis not present

## 2022-09-05 DIAGNOSIS — R69 Illness, unspecified: Secondary | ICD-10-CM | POA: Diagnosis not present

## 2022-09-05 DIAGNOSIS — N390 Urinary tract infection, site not specified: Secondary | ICD-10-CM | POA: Diagnosis not present

## 2022-10-03 DIAGNOSIS — I5032 Chronic diastolic (congestive) heart failure: Secondary | ICD-10-CM | POA: Diagnosis not present

## 2022-10-03 DIAGNOSIS — I10 Essential (primary) hypertension: Secondary | ICD-10-CM | POA: Diagnosis not present

## 2022-10-18 DIAGNOSIS — F32A Depression, unspecified: Secondary | ICD-10-CM | POA: Diagnosis not present

## 2022-10-21 DIAGNOSIS — I7091 Generalized atherosclerosis: Secondary | ICD-10-CM | POA: Diagnosis not present

## 2022-10-29 DIAGNOSIS — F0280 Dementia in other diseases classified elsewhere without behavioral disturbance: Secondary | ICD-10-CM | POA: Diagnosis not present

## 2022-10-29 DIAGNOSIS — F32A Depression, unspecified: Secondary | ICD-10-CM | POA: Diagnosis not present

## 2022-11-05 DIAGNOSIS — N952 Postmenopausal atrophic vaginitis: Secondary | ICD-10-CM | POA: Diagnosis not present

## 2022-11-05 DIAGNOSIS — I5032 Chronic diastolic (congestive) heart failure: Secondary | ICD-10-CM | POA: Diagnosis not present

## 2022-11-05 DIAGNOSIS — I10 Essential (primary) hypertension: Secondary | ICD-10-CM | POA: Diagnosis not present

## 2022-11-06 DIAGNOSIS — I10 Essential (primary) hypertension: Secondary | ICD-10-CM | POA: Diagnosis not present

## 2022-11-06 DIAGNOSIS — I5032 Chronic diastolic (congestive) heart failure: Secondary | ICD-10-CM | POA: Diagnosis not present

## 2022-11-06 DIAGNOSIS — R3981 Functional urinary incontinence: Secondary | ICD-10-CM | POA: Diagnosis not present

## 2022-11-06 DIAGNOSIS — F0390 Unspecified dementia without behavioral disturbance: Secondary | ICD-10-CM | POA: Diagnosis not present

## 2022-11-07 DIAGNOSIS — I10 Essential (primary) hypertension: Secondary | ICD-10-CM | POA: Diagnosis not present

## 2022-11-08 DIAGNOSIS — F32A Depression, unspecified: Secondary | ICD-10-CM | POA: Diagnosis not present

## 2022-11-08 DIAGNOSIS — F0280 Dementia in other diseases classified elsewhere without behavioral disturbance: Secondary | ICD-10-CM | POA: Diagnosis not present

## 2022-11-11 DIAGNOSIS — M159 Polyosteoarthritis, unspecified: Secondary | ICD-10-CM | POA: Diagnosis not present

## 2022-11-11 DIAGNOSIS — R279 Unspecified lack of coordination: Secondary | ICD-10-CM | POA: Diagnosis not present

## 2022-11-11 DIAGNOSIS — M6281 Muscle weakness (generalized): Secondary | ICD-10-CM | POA: Diagnosis not present

## 2022-11-11 DIAGNOSIS — S2241XD Multiple fractures of ribs, right side, subsequent encounter for fracture with routine healing: Secondary | ICD-10-CM | POA: Diagnosis not present

## 2022-11-12 DIAGNOSIS — M6281 Muscle weakness (generalized): Secondary | ICD-10-CM | POA: Diagnosis not present

## 2022-11-12 DIAGNOSIS — R279 Unspecified lack of coordination: Secondary | ICD-10-CM | POA: Diagnosis not present

## 2022-11-12 DIAGNOSIS — E871 Hypo-osmolality and hyponatremia: Secondary | ICD-10-CM | POA: Diagnosis not present

## 2022-11-12 DIAGNOSIS — N183 Chronic kidney disease, stage 3 unspecified: Secondary | ICD-10-CM | POA: Diagnosis not present

## 2022-11-12 DIAGNOSIS — S2241XD Multiple fractures of ribs, right side, subsequent encounter for fracture with routine healing: Secondary | ICD-10-CM | POA: Diagnosis not present

## 2022-11-12 DIAGNOSIS — M159 Polyosteoarthritis, unspecified: Secondary | ICD-10-CM | POA: Diagnosis not present

## 2022-11-13 DIAGNOSIS — R279 Unspecified lack of coordination: Secondary | ICD-10-CM | POA: Diagnosis not present

## 2022-11-13 DIAGNOSIS — S2241XD Multiple fractures of ribs, right side, subsequent encounter for fracture with routine healing: Secondary | ICD-10-CM | POA: Diagnosis not present

## 2022-11-13 DIAGNOSIS — M6281 Muscle weakness (generalized): Secondary | ICD-10-CM | POA: Diagnosis not present

## 2022-11-13 DIAGNOSIS — F0280 Dementia in other diseases classified elsewhere without behavioral disturbance: Secondary | ICD-10-CM | POA: Diagnosis not present

## 2022-11-13 DIAGNOSIS — M159 Polyosteoarthritis, unspecified: Secondary | ICD-10-CM | POA: Diagnosis not present

## 2022-11-13 DIAGNOSIS — F32A Depression, unspecified: Secondary | ICD-10-CM | POA: Diagnosis not present

## 2022-11-14 DIAGNOSIS — S2241XD Multiple fractures of ribs, right side, subsequent encounter for fracture with routine healing: Secondary | ICD-10-CM | POA: Diagnosis not present

## 2022-11-14 DIAGNOSIS — M6281 Muscle weakness (generalized): Secondary | ICD-10-CM | POA: Diagnosis not present

## 2022-11-14 DIAGNOSIS — R279 Unspecified lack of coordination: Secondary | ICD-10-CM | POA: Diagnosis not present

## 2022-11-14 DIAGNOSIS — M159 Polyosteoarthritis, unspecified: Secondary | ICD-10-CM | POA: Diagnosis not present

## 2022-11-15 DIAGNOSIS — M159 Polyosteoarthritis, unspecified: Secondary | ICD-10-CM | POA: Diagnosis not present

## 2022-11-15 DIAGNOSIS — I5032 Chronic diastolic (congestive) heart failure: Secondary | ICD-10-CM | POA: Diagnosis not present

## 2022-11-15 DIAGNOSIS — R279 Unspecified lack of coordination: Secondary | ICD-10-CM | POA: Diagnosis not present

## 2022-11-15 DIAGNOSIS — M6281 Muscle weakness (generalized): Secondary | ICD-10-CM | POA: Diagnosis not present

## 2022-11-15 DIAGNOSIS — I10 Essential (primary) hypertension: Secondary | ICD-10-CM | POA: Diagnosis not present

## 2022-11-15 DIAGNOSIS — S2241XD Multiple fractures of ribs, right side, subsequent encounter for fracture with routine healing: Secondary | ICD-10-CM | POA: Diagnosis not present

## 2022-11-18 DIAGNOSIS — M159 Polyosteoarthritis, unspecified: Secondary | ICD-10-CM | POA: Diagnosis not present

## 2022-11-18 DIAGNOSIS — M6281 Muscle weakness (generalized): Secondary | ICD-10-CM | POA: Diagnosis not present

## 2022-11-18 DIAGNOSIS — S2241XD Multiple fractures of ribs, right side, subsequent encounter for fracture with routine healing: Secondary | ICD-10-CM | POA: Diagnosis not present

## 2022-11-18 DIAGNOSIS — R279 Unspecified lack of coordination: Secondary | ICD-10-CM | POA: Diagnosis not present

## 2022-11-19 DIAGNOSIS — S2241XD Multiple fractures of ribs, right side, subsequent encounter for fracture with routine healing: Secondary | ICD-10-CM | POA: Diagnosis not present

## 2022-11-19 DIAGNOSIS — M6281 Muscle weakness (generalized): Secondary | ICD-10-CM | POA: Diagnosis not present

## 2022-11-19 DIAGNOSIS — R279 Unspecified lack of coordination: Secondary | ICD-10-CM | POA: Diagnosis not present

## 2022-11-19 DIAGNOSIS — M159 Polyosteoarthritis, unspecified: Secondary | ICD-10-CM | POA: Diagnosis not present

## 2022-11-20 DIAGNOSIS — M6281 Muscle weakness (generalized): Secondary | ICD-10-CM | POA: Diagnosis not present

## 2022-11-20 DIAGNOSIS — R279 Unspecified lack of coordination: Secondary | ICD-10-CM | POA: Diagnosis not present

## 2022-11-20 DIAGNOSIS — M159 Polyosteoarthritis, unspecified: Secondary | ICD-10-CM | POA: Diagnosis not present

## 2022-11-20 DIAGNOSIS — S2241XD Multiple fractures of ribs, right side, subsequent encounter for fracture with routine healing: Secondary | ICD-10-CM | POA: Diagnosis not present

## 2022-11-23 DIAGNOSIS — R279 Unspecified lack of coordination: Secondary | ICD-10-CM | POA: Diagnosis not present

## 2022-11-23 DIAGNOSIS — S2241XD Multiple fractures of ribs, right side, subsequent encounter for fracture with routine healing: Secondary | ICD-10-CM | POA: Diagnosis not present

## 2022-11-23 DIAGNOSIS — M159 Polyosteoarthritis, unspecified: Secondary | ICD-10-CM | POA: Diagnosis not present

## 2022-11-23 DIAGNOSIS — M6281 Muscle weakness (generalized): Secondary | ICD-10-CM | POA: Diagnosis not present

## 2022-11-24 DIAGNOSIS — M6281 Muscle weakness (generalized): Secondary | ICD-10-CM | POA: Diagnosis not present

## 2022-11-24 DIAGNOSIS — R279 Unspecified lack of coordination: Secondary | ICD-10-CM | POA: Diagnosis not present

## 2022-11-24 DIAGNOSIS — M159 Polyosteoarthritis, unspecified: Secondary | ICD-10-CM | POA: Diagnosis not present

## 2022-11-24 DIAGNOSIS — S2241XD Multiple fractures of ribs, right side, subsequent encounter for fracture with routine healing: Secondary | ICD-10-CM | POA: Diagnosis not present

## 2022-11-25 DIAGNOSIS — R279 Unspecified lack of coordination: Secondary | ICD-10-CM | POA: Diagnosis not present

## 2022-11-25 DIAGNOSIS — M159 Polyosteoarthritis, unspecified: Secondary | ICD-10-CM | POA: Diagnosis not present

## 2022-11-25 DIAGNOSIS — S2241XD Multiple fractures of ribs, right side, subsequent encounter for fracture with routine healing: Secondary | ICD-10-CM | POA: Diagnosis not present

## 2022-11-25 DIAGNOSIS — M6281 Muscle weakness (generalized): Secondary | ICD-10-CM | POA: Diagnosis not present

## 2022-11-26 DIAGNOSIS — S2241XD Multiple fractures of ribs, right side, subsequent encounter for fracture with routine healing: Secondary | ICD-10-CM | POA: Diagnosis not present

## 2022-11-26 DIAGNOSIS — R279 Unspecified lack of coordination: Secondary | ICD-10-CM | POA: Diagnosis not present

## 2022-11-26 DIAGNOSIS — M6281 Muscle weakness (generalized): Secondary | ICD-10-CM | POA: Diagnosis not present

## 2022-11-26 DIAGNOSIS — M159 Polyosteoarthritis, unspecified: Secondary | ICD-10-CM | POA: Diagnosis not present

## 2022-11-27 DIAGNOSIS — R279 Unspecified lack of coordination: Secondary | ICD-10-CM | POA: Diagnosis not present

## 2022-11-27 DIAGNOSIS — M159 Polyosteoarthritis, unspecified: Secondary | ICD-10-CM | POA: Diagnosis not present

## 2022-11-27 DIAGNOSIS — S2241XD Multiple fractures of ribs, right side, subsequent encounter for fracture with routine healing: Secondary | ICD-10-CM | POA: Diagnosis not present

## 2022-11-27 DIAGNOSIS — M6281 Muscle weakness (generalized): Secondary | ICD-10-CM | POA: Diagnosis not present

## 2022-11-28 DIAGNOSIS — M159 Polyosteoarthritis, unspecified: Secondary | ICD-10-CM | POA: Diagnosis not present

## 2022-11-28 DIAGNOSIS — R279 Unspecified lack of coordination: Secondary | ICD-10-CM | POA: Diagnosis not present

## 2022-11-28 DIAGNOSIS — S2241XD Multiple fractures of ribs, right side, subsequent encounter for fracture with routine healing: Secondary | ICD-10-CM | POA: Diagnosis not present

## 2022-11-28 DIAGNOSIS — M6281 Muscle weakness (generalized): Secondary | ICD-10-CM | POA: Diagnosis not present

## 2022-11-29 DIAGNOSIS — N952 Postmenopausal atrophic vaginitis: Secondary | ICD-10-CM | POA: Diagnosis not present

## 2022-12-02 DIAGNOSIS — F0280 Dementia in other diseases classified elsewhere without behavioral disturbance: Secondary | ICD-10-CM | POA: Diagnosis not present

## 2022-12-02 DIAGNOSIS — F32A Depression, unspecified: Secondary | ICD-10-CM | POA: Diagnosis not present

## 2022-12-13 DIAGNOSIS — F0390 Unspecified dementia without behavioral disturbance: Secondary | ICD-10-CM | POA: Diagnosis not present

## 2022-12-13 DIAGNOSIS — I10 Essential (primary) hypertension: Secondary | ICD-10-CM | POA: Diagnosis not present

## 2022-12-13 DIAGNOSIS — Z7409 Other reduced mobility: Secondary | ICD-10-CM | POA: Diagnosis not present

## 2022-12-13 DIAGNOSIS — R3981 Functional urinary incontinence: Secondary | ICD-10-CM | POA: Diagnosis not present

## 2022-12-16 DIAGNOSIS — I10 Essential (primary) hypertension: Secondary | ICD-10-CM | POA: Diagnosis not present

## 2022-12-16 DIAGNOSIS — I5032 Chronic diastolic (congestive) heart failure: Secondary | ICD-10-CM | POA: Diagnosis not present

## 2022-12-17 DIAGNOSIS — F331 Major depressive disorder, recurrent, moderate: Secondary | ICD-10-CM | POA: Diagnosis not present

## 2022-12-17 DIAGNOSIS — F0280 Dementia in other diseases classified elsewhere without behavioral disturbance: Secondary | ICD-10-CM | POA: Diagnosis not present

## 2022-12-17 DIAGNOSIS — F321 Major depressive disorder, single episode, moderate: Secondary | ICD-10-CM | POA: Diagnosis not present

## 2022-12-27 DIAGNOSIS — F0280 Dementia in other diseases classified elsewhere without behavioral disturbance: Secondary | ICD-10-CM | POA: Diagnosis not present

## 2022-12-27 DIAGNOSIS — F32A Depression, unspecified: Secondary | ICD-10-CM | POA: Diagnosis not present

## 2023-12-04 ENCOUNTER — Emergency Department (HOSPITAL_COMMUNITY): Payer: Medicare (Managed Care)

## 2023-12-04 ENCOUNTER — Other Ambulatory Visit: Payer: Self-pay

## 2023-12-04 ENCOUNTER — Emergency Department (HOSPITAL_COMMUNITY)
Admission: EM | Admit: 2023-12-04 | Discharge: 2023-12-04 | Disposition: A | Discharge status: Skilled Nursing Facility | Payer: Medicare (Managed Care) | Attending: Emergency Medicine | Admitting: Emergency Medicine

## 2023-12-04 ENCOUNTER — Encounter (HOSPITAL_COMMUNITY): Payer: Self-pay

## 2023-12-04 DIAGNOSIS — S0101XA Laceration without foreign body of scalp, initial encounter: Secondary | ICD-10-CM | POA: Diagnosis not present

## 2023-12-04 DIAGNOSIS — F0390 Unspecified dementia without behavioral disturbance: Secondary | ICD-10-CM | POA: Insufficient documentation

## 2023-12-04 DIAGNOSIS — W050XXA Fall from non-moving wheelchair, initial encounter: Secondary | ICD-10-CM | POA: Diagnosis not present

## 2023-12-04 DIAGNOSIS — W19XXXA Unspecified fall, initial encounter: Secondary | ICD-10-CM

## 2023-12-04 DIAGNOSIS — S0990XA Unspecified injury of head, initial encounter: Secondary | ICD-10-CM | POA: Diagnosis present

## 2023-12-04 NOTE — ED Notes (Signed)
 CCMD called and verified

## 2023-12-04 NOTE — ED Notes (Signed)
 Family member called, voicemail message left.

## 2023-12-04 NOTE — ED Provider Notes (Signed)
 [place] EMERGENCY DEPARTMENT AT [facility] Provider Note   CSN: [id number] Arrival date & time: 12/04/23  1756     Patient presents with: [name] MARLA Ezzard is a 89 y.o. female who presents emergency department with a chief complaint of fall.  Patient unable to give history due to dementia and hearing issues.  History gathered by EMS at bedside as well as by phone call with the patient's daughter and son who is her healthcare power of attorney.  She has a baseline history of dementia and hearing problems.  Daughter states that her mental status is usually worse at night.  Is normally bedbound but her son bought her a wheelchair.  She is found having fallen out of the wheelchair with a laceration to the top of her head and her leg was caught up in the wheelchair spokes.  Unsure which leg this was.  Patient unable to tell me which leg hurts.  She has a small laceration to the top of her head.  She is apparently at baseline for her mental status.   Patient's tetanus is up-to-date last tetanus was in 68 of 2023   Fall       Prior to Admission medications   Medication Sig Start Date End Date Taking? Authorizing Provider  acetaminophen  (TYLENOL ) 325 MG tablet Take 2 tablets (650 mg total) by mouth every 6 (six) hours. 01/25/20   Vicci Burnard SAUNDERS, PA-C  amLODipine  (NORVASC ) 2.5 MG tablet Take 2.5 mg by mouth daily. 09/03/19   [provider]  budesonide  (ENTOCORT EC ) 3 MG 24 hr capsule Take 3 capsules (9 mg total) by mouth daily. Patient taking differently: Take 3 mg by mouth daily.  11/16/18   Avram Lupita BRAVO, MD  calcium -vitamin D  (OSCAL WITH D) 500-200 MG-UNIT tablet Take 1 tablet by mouth 2 (two) times daily.    [provider]  docusate sodium  (COLACE) 100 MG capsule Take 1 capsule (100 mg total) by mouth 2 (two) times daily. 01/25/20   Vicci Burnard SAUNDERS, PA-C  donepezil  (ARICEPT ) 10 MG tablet Take 1 tablet daily Patient taking differently: Take 10 mg by  mouth daily.  11/16/18   Georjean Darice HERO, MD  hydrALAZINE  (APRESOLINE ) 25 MG tablet Take 1 tablet (25 mg total) by mouth every 8 (eight) hours. Patient taking differently: Take 25 mg by mouth 3 (three) times daily.  05/11/18   Avram Lupita BRAVO, MD  lidocaine  (LIDODERM ) 5 % Place 1 patch onto the skin daily. Remove & Discard patch within 12 hours or as directed by MD 01/25/20   Vicci Burnard SAUNDERS, PA-C  losartan  (COZAAR ) 25 MG tablet Take 1 tablet by mouth once daily Patient taking differently: Take 25 mg by mouth daily.  12/15/18   Cleatus Arlyss RAMAN, MD  methocarbamol  (ROBAXIN ) 500 MG tablet Take 1 tablet (500 mg total) by mouth 3 (three) times daily. 01/25/20   Vicci Burnard SAUNDERS, PA-C  Multiple Vitamin (MULTIVITAMIN) tablet Take 1 tablet by mouth daily.    [provider]  sertraline  (ZOLOFT ) 25 MG tablet Take 1 tablet by mouth once daily Patient taking differently: Take 25 mg by mouth daily.  08/13/18   Avram Lupita BRAVO, MD    Allergies: Other    Review of Systems  Updated Vital Signs BP (!) 153/100 (BP Location: Right Arm)   Pulse (!) 106   Temp 98.3 F (36.8 C) (Oral)   Resp 19   SpO2 96%   Physical Exam Vitals and  nursing note reviewed.  Constitutional:      General: She is not in acute distress.    Appearance: She is well-developed. She is not diaphoretic.  HENT:     Head: Normocephalic.     Comments: Small cut to the scalp    Right Ear: External ear normal.     Left Ear: External ear normal.     Nose: Nose normal.     Mouth/Throat:     Mouth: Mucous membranes are moist.   Eyes:     General: No scleral icterus.    Conjunctiva/sclera: Conjunctivae normal.     Pupils: Pupils are equal, round, and reactive to light.    Cardiovascular:     Rate and Rhythm: Normal rate and regular rhythm.     Heart sounds: Normal heart sounds. No murmur heard.    No friction rub. No gallop.  Pulmonary:     Effort: Pulmonary effort is normal. No respiratory distress.     Breath sounds:  Normal breath sounds.  Abdominal:     General: Bowel sounds are normal. There is no distension.     Palpations: Abdomen is soft. There is no mass.     Tenderness: There is no abdominal tenderness. There is no guarding.   Musculoskeletal:     Cervical back: Normal range of motion.     Comments: Contracted and atrophied legs  No obvious deformities of fractures   Skin:    General: Skin is warm and dry.   Neurological:     Mental Status: She is alert and oriented to person, place, and time.   Psychiatric:        Behavior: Behavior normal.     (all labs ordered are listed, but only abnormal results are displayed) Labs Reviewed - No data to display  EKG: None  Radiology: No results found.   Procedures   Medications Ordered in the ED - No data to display                                  Medical Decision Making Patient here after unwitnessed fall.  I reviewed the patient's images including CT head, CT C-spine, bilateral ankle exams, portable chest and pelvis x-rays.  There are no acute findings. Is a small cut to her head.  She is up-to-date on her tetanus shot.  The cut is too small for repair.  Patient appears otherwise appropriate to return to her facility.  Amount and/or Complexity of Data Reviewed Independent Historian:     Details: Independent history gathered from patient's son and daughter iPhone.  She has a history of dementia and seems to be at her baseline. Radiology: ordered.        Final diagnoses:  None    ED Discharge Orders     None          Arloa Chroman, DEVONNA 12/04/23 2209    Randol Simmonds, MD 12/04/23 [phone]

## 2023-12-04 NOTE — ED Triage Notes (Signed)
 Pt bib [facility] coming from [facility] after unwitnessed fall out of wheelchair. Pt has notable lac to top of head with minimal controlled bleeding. Pt not on blood thinners per EMS. Pt alert and oriented to baseline (to self) and has no complaints at this time. VSS with EMS.

## 2023-12-04 NOTE — ED Notes (Addendum)
 Unsuccessful attempt to get ahold of person at care facility x 1

## 2023-12-04 NOTE — ED Notes (Signed)
 Unsuccessful attempt to get a hold of staff at [facility] x2

## 2023-12-04 NOTE — Discharge Instructions (Signed)
 There were no abnormal findings on her xrays. She has a small cut on her scalp. It did not need repair. Normal wound care is all she needs, Get help for any new or worsening symptoms

## 2023-12-04 NOTE — ED Notes (Signed)
 Unsuccessful attempt to get ahold of staff at care facility x3

## 2024-04-29 ENCOUNTER — Telehealth: Payer: Self-pay | Admitting: Internal Medicine

## 2024-04-29 NOTE — Telephone Encounter (Signed)
 The pt has had several days of BRBPR.  She is in a nursing facility and pt son would like her seen.  She is a previous pt of Dr Avram in 2020.  Appt made to see Camie on 11/24 at 1130 am. [facility]) has been advised

## 2024-04-29 NOTE — Telephone Encounter (Signed)
 Inbound call from [facility] on patient's behalf requesting for patient to be seen for possible GI bleed. Advised next available is currently in January. Request a call back to discuss possible sooner appointment. Call back number is [phone] and ask to speak with Bella. Please advise, thank you

## 2024-05-03 ENCOUNTER — Ambulatory Visit (INDEPENDENT_AMBULATORY_CARE_PROVIDER_SITE_OTHER): Payer: Medicare (Managed Care) | Admitting: Gastroenterology

## 2024-05-03 ENCOUNTER — Ambulatory Visit: Payer: Self-pay | Admitting: Gastroenterology

## 2024-05-03 ENCOUNTER — Encounter: Payer: Self-pay | Admitting: Gastroenterology

## 2024-05-03 ENCOUNTER — Telehealth: Payer: Self-pay | Admitting: Internal Medicine

## 2024-05-03 ENCOUNTER — Other Ambulatory Visit (INDEPENDENT_AMBULATORY_CARE_PROVIDER_SITE_OTHER): Payer: Medicare (Managed Care)

## 2024-05-03 VITALS — BP 116/60

## 2024-05-03 DIAGNOSIS — K625 Hemorrhage of anus and rectum: Secondary | ICD-10-CM

## 2024-05-03 DIAGNOSIS — D649 Anemia, unspecified: Secondary | ICD-10-CM | POA: Diagnosis not present

## 2024-05-03 DIAGNOSIS — K5000 Crohn's disease of small intestine without complications: Secondary | ICD-10-CM | POA: Diagnosis not present

## 2024-05-03 LAB — CBC WITH DIFFERENTIAL/PLATELET
Basophils Absolute: 0 K/uL (ref 0.0–0.1)
Basophils Relative: 0.5 % (ref 0.0–3.0)
Eosinophils Absolute: 0.1 K/uL (ref 0.0–0.7)
Eosinophils Relative: 2.3 % (ref 0.0–5.0)
HCT: 24.6 % — ABNORMAL LOW (ref 36.0–46.0)
Hemoglobin: 7.8 g/dL — CL (ref 12.0–15.0)
Lymphocytes Relative: 12.9 % (ref 12.0–46.0)
Lymphs Abs: 0.8 K/uL (ref 0.7–4.0)
MCHC: 31.5 g/dL (ref 30.0–36.0)
MCV: 77.6 fl — ABNORMAL LOW (ref 78.0–100.0)
Monocytes Absolute: 0.5 K/uL (ref 0.1–1.0)
Monocytes Relative: 8.7 % (ref 3.0–12.0)
Neutro Abs: 4.4 K/uL (ref 1.4–7.7)
Neutrophils Relative %: 75.6 % (ref 43.0–77.0)
Platelets: 300 K/uL (ref 150.0–400.0)
RBC: 3.17 Mil/uL — ABNORMAL LOW (ref 3.87–5.11)
RDW: 15.8 % — ABNORMAL HIGH (ref 11.5–15.5)
WBC: 5.9 K/uL (ref 4.0–10.5)

## 2024-05-03 LAB — COMPREHENSIVE METABOLIC PANEL WITH GFR
ALT: 10 U/L (ref 0–35)
AST: 12 U/L (ref 0–37)
Albumin: 3.3 g/dL — ABNORMAL LOW (ref 3.5–5.2)
Alkaline Phosphatase: 80 U/L (ref 39–117)
BUN: 19 mg/dL (ref 6–23)
CO2: 27 meq/L (ref 19–32)
Calcium: 8.9 mg/dL (ref 8.4–10.5)
Chloride: 108 meq/L (ref 96–112)
Creatinine, Ser: 0.65 mg/dL (ref 0.40–1.20)
GFR: 78.32 mL/min (ref >60.00)
Glucose, Bld: 93 mg/dL (ref 70–99)
Potassium: 3.9 meq/L (ref 3.5–5.1)
Sodium: 140 meq/L (ref 135–145)
Total Bilirubin: 0.6 mg/dL (ref 0.2–1.2)
Total Protein: 6.3 g/dL (ref 6.0–8.3)

## 2024-05-03 LAB — SEDIMENTATION RATE: Sed Rate: 33 mm/h — ABNORMAL HIGH (ref 0–30)

## 2024-05-03 LAB — B12 AND FOLATE PANEL
Folate: >23.7 ng/mL (ref >5.9)
Vitamin B-12: 375 pg/mL (ref 211–911)

## 2024-05-03 LAB — IBC + FERRITIN
Ferritin: 11.5 ng/mL (ref 10.0–291.0)
Iron: 10 ug/dL — ABNORMAL LOW (ref 42–145)
Saturation Ratios: 4.2 % — ABNORMAL LOW (ref 20.0–50.0)
TIBC: 240.8 ug/dL — ABNORMAL LOW (ref 250.0–450.0)
Transferrin: 172 mg/dL — ABNORMAL LOW (ref 212.0–360.0)

## 2024-05-03 LAB — C-REACTIVE PROTEIN: CRP: 3.2 mg/dL (ref 0.5–20.0)

## 2024-05-03 NOTE — Progress Notes (Signed)
 [name] [id number] [birth year]   Chief Complaint: Rectal bleeding  Referring Provider: Dietrich Males, AGNP-C Primary GI MD: Dr. Avram  HPI: [name] is a 89 y.o. female with past medical history of CHF, Crohn's disease, diverticulosis, bladder prolapse s/p pessary, hemorrhoids, hyperparathyroidism s/p parathyroidectomy, HTN, HLD, osteoporosis, vitamin D  deficiency, protein calorie malnutrition, CKD stage III who presents today for a complaint of rectal bleeding.    Last seen in office 04/28/2018.  Crohn's disease stable at that time on budesonide  9 mg daily.  Call received from [facility] on patient's behalf requesting to be seen for possible GI bleed.  They endorsed patient had several days of BRBPR.  She is in a nursing facility, son wanted her seen.  CBC 04/26/2024 with hemoglobin 7.8  CBC 04/30/2024 with hemoglobin 8.   Discussed the use of AI scribe software for clinical note transcription with the patient, who gave verbal consent to proceed.  History of Present Illness [name] is an 89 year old female with Crohn's disease who presents with rectal bleeding. Here with transportation/scheduling assistant from SNF [facility]).   Rectal bleeding - Intermittent rectal bleeding for approximately one week - Significant amount of blood observed in briefs (per nursing assistant over phone) - No associated abdominal pain - No melena, only presence of blood - Uncertain about bowel movement frequency  Crohn's disease management - History of Crohn's disease - Chronic use of budesonide  for inflammation control - Last colonoscopy in 2010  Hematologic findings - Decreasing hemoglobin levels - Blood counts monitored every other day or every two days in SNF due to ongoing bleeding  Constitutional and systemic symptoms - No chest pain - No shortness of breath - No nausea or vomiting    History is limited by patient's poor memory.  She is able to  state her DOB.  Unaware of what month or year it is.  Aware she is in a doctor's office.  Frequently repeats questions.  Notes that she lives in a nursing facility but does not know the name of it.  The transportation assistant here with the patient has very little knowledge of her current condition, nature or amount of bleeding, etc.  A nurse/nursing assistant was called over the phone and is also able to give very little information.  She reports that patient has had an abdominal ultrasound which was unrevealing.  Medications include budesonide  9 mg daily, Carafate, Colace, Zofran  as needed.   Previous GI Procedures/Imaging   Colonoscopy 12/23/2008 Ileitis-Crohn's in the ileum, biopsies compatible with Crohn's, ischemia NSAID injury also in differential Moderate diverticulosis in the sigmoid colon Internal hemorrhoids Otherwise normal exam   Past Medical History:  Diagnosis Date   Acute diastolic (congestive) heart failure (HCC)    Bladder prolapse, female, acquired    pessary in   Chronic diastolic (congestive) heart failure (HCC)    Chronic pain syndrome    Complete uterine prolapse    Crohn's disease (HCC) 2010   Diverticulosis    E. coli UTI 06/09/2013   Fall 05/2013   Hemorrhoids    History of hypercalcemia    History of hyperglycemia    Hydronephrosis    Hyperparathyroidism    Hypertension    Multiple pelvic fractures (HCC) 07/02/2013   Osteoporosis    Pubic ramus fracture (HCC)    Urinary incontinence 09/09/2011   Vitamin D  deficiency     Past Surgical History:  Procedure Laterality Date   COLONOSCOPY  12/23/2008   crohn's,  diverticulosis, internal hemorrhoids   HIP FRACTURE SURGERY Left    left, prosthesis   LAPAROSCOPY  10/2009   PARATHYROIDECTOMY Left 08/30/2013   Procedure: LEFT INFERIOR PARATHYROIDECTOMY;  Surgeon: Krystal CHRISTELLA Spinner, MD;  Location: [facility];  Service: General;  Laterality: Left;   RHINOPLASTY     TONSILLECTOMY      Current Outpatient Medications   Medication Sig Dispense Refill   acetaminophen  (TYLENOL ) 325 MG tablet Take 2 tablets (650 mg total) by mouth every 6 (six) hours.     amLODipine  (NORVASC ) 2.5 MG tablet Take 2.5 mg by mouth daily.     budesonide  (ENTOCORT EC ) 3 MG 24 hr capsule Take 3 capsules (9 mg total) by mouth daily. 90 capsule 3   calcium -vitamin D  (OSCAL WITH D) 500-200 MG-UNIT tablet Take 1 tablet by mouth 2 (two) times daily.     docusate sodium  (COLACE) 100 MG capsule Take 1 capsule (100 mg total) by mouth 2 (two) times daily. 10 capsule 0   donepezil  (ARICEPT ) 10 MG tablet Take 1 tablet daily 90 tablet 3   hydrALAZINE  (APRESOLINE ) 25 MG tablet Take 1 tablet (25 mg total) by mouth every 8 (eight) hours. (Patient taking differently: Take 25 mg by mouth 3 (three) times daily. ) 90 tablet 2   lidocaine  (LIDODERM ) 5 % Place 1 patch onto the skin daily. Remove & Discard patch within 12 hours or as directed by MD 30 patch 0   losartan  (COZAAR ) 25 MG tablet Take 1 tablet by mouth once daily (Patient taking differently: Take 25 mg by mouth daily. ) 90 tablet 0   methocarbamol  (ROBAXIN ) 500 MG tablet Take 1 tablet (500 mg total) by mouth 3 (three) times daily.     Multiple Vitamin (MULTIVITAMIN) tablet Take 1 tablet by mouth daily.     sertraline  (ZOLOFT ) 25 MG tablet Take 1 tablet by mouth once daily (Patient taking differently: Take 25 mg by mouth daily. ) 30 tablet 2   No current facility-administered medications for this visit.    Allergies as of 05/03/2024 - Review Complete 05/03/2024  Allergen Reaction Noted   Other  11/09/2018    Family History  Problem Relation Age of Onset   Heart disease Father    Colon cancer Father    Osteoporosis Mother    Breast cancer Mother    Prostate cancer Brother     Social History   Tobacco Use   Smoking status: Never   Smokeless tobacco: Never  Vaping Use   Vaping status: Never Used  Substance Use Topics   Alcohol use: Yes    Alcohol/week: 0.0 standard drinks of  alcohol    Comment: occ wine   Drug use: No     Review of Systems:    Constitutional: No fever, chills Cardiovascular: No chest pain Respiratory: No SOB  Gastrointestinal: See HPI and otherwise negative   Physical Exam:  Vital signs: BP 116/60   Constitutional: Pleasant, frail-appearing elderly female in NAD, alert and cooperative, nonweightbearing and wheelchair-bound requiring full lift for transportation. Head:  Normocephalic and atraumatic.  Eyes: No scleral icterus.  Respiratory: Respirations even and unlabored. Lungs clear to auscultation bilaterally.  No wheezes, crackles, or rhonchi.  Cardiovascular:  Regular rate and rhythm. No murmurs. No peripheral edema. Gastrointestinal:  Soft, nondistended, nontender. No rebound or guarding. Normal bowel sounds. No appreciable masses or hepatomegaly. Rectal:  Not performed.  Neurologic:  Alert and oriented x4;  grossly normal neurologically.  Skin:   Dry and intact without significant  lesions or rashes. Psychiatric: Oriented to person, place and time. Demonstrates good judgement and reason without abnormal affect or behaviors.   RELEVANT LABS AND IMAGING: CBC    Component Value Date/Time   WBC 9.7 01/05/2022 1844   RBC 4.64 01/05/2022 1844   HGB 14.5 01/05/2022 1844   HCT 43.3 01/05/2022 1844   PLT 275 01/05/2022 1844   MCV 93.3 01/05/2022 1844   MCH 31.3 01/05/2022 1844   MCHC 33.5 01/05/2022 1844   RDW 12.9 01/05/2022 1844   LYMPHSABS 0.7 01/18/2020 1418   MONOABS 0.6 01/18/2020 1418   EOSABS 0.0 01/18/2020 1418   BASOSABS 0.0 01/18/2020 1418    CMP     Component Value Date/Time   NA 137 01/05/2022 1844   K 4.3 01/05/2022 1844   CL 109 01/05/2022 1844   CO2 24 01/05/2022 1844   GLUCOSE 112 (H) 01/05/2022 1844   BUN 18 01/05/2022 1844   CREATININE 0.90 01/05/2022 1844   CREATININE 0.74 12/01/2012 1235   CALCIUM  9.2 01/05/2022 1844   CALCIUM  11.4 (H) 10/02/2009 2302   PROT 6.8 01/18/2020 1903   ALBUMIN 4.1  01/18/2020 1903   AST 25 01/18/2020 1903   ALT 18 01/18/2020 1903   ALKPHOS 57 01/18/2020 1903   BILITOT 1.6 (H) 01/18/2020 1903   GFRNONAA >60 01/05/2022 1844   GFRAA >60 01/20/2020 0316   Echocardiogram 08/24/2018 1. The left ventricle has hyperdynamic systolic function, with an  ejection fraction of >65%. The cavity size was normal. There is moderately  increased left ventricular wall thickness. Left ventricular diastolic  Doppler parameters are consistent with  impaired relaxation. Elevated mean left atrial pressure.   2. The right ventricle has normal systolic function. The cavity was  normal.   3. The tricuspid valve is grossly normal.   4. The aortic valve is tricuspid Moderate thickening of the aortic valve  Aortic valve regurgitation is mild by color flow Doppler.   5. Vigorous LV systolic function; mild diastolic dysfunction; moderate  LVH with proximal septal thickening and narrow LVOT; elevated LVOT  gradient of approximately 4 m/s with valsalva; mild AI and MR.    Assessment/Plan:   Assessment & Plan Rectal bleeding Anemia Crohn's disease Patient seen today, presenting from SNF, for evaluation of 1 week of intermittent rectal bleeding which per staff has been significant.  Here today with the transportation assistant.  No family member present.  They have been monitoring her hemoglobin which has dropped and on most recent check was 8.0.  MCV 80.  She is not on any iron supplements.  She denies any abdominal pain.  It is unclear how frequently she has a bowel movement but there is no report of any constipation or diarrhea, though she does have docusate sodium  in her medication list.  She has history of Crohn's ileitis and has not been seen here since 2019.  Has been on budesonide  9 mg daily.  Last colonoscopy 2010. Patient is wheelchair-bound and not weightbearing, requiring full lift for transportation.  Unable to perform rectal exam in office today.  Case and plan  discussed with Dr. Leigh in office today.  Will plan to repeat labs today to include iron panel and ferritin.  If significantly low we will plan to refer for IV iron infusions.  If hemoglobin continues to trend downward she may need evaluation in the ER and possible admission for further workup.  Will order a fecal calprotectin to evaluate for intestinal inflammation.  Discussed with Dr. Avram regarding  hospital-based flex sig versus colonoscopy. ER precautions given.  Hopefully can manage outpatient, but again may need admission.  - Discussed with Dr. Avram regarding flexible sigmoidoscopy versus colonoscopy in hospital setting. - Labs today: CBC, BMP, ESR, CRP, iron/ferritin, B12, folate - Order fecal calprotectin - If low iron consider IV iron infusions - Will need regular monitoring of hemoglobin   Camie Furbish, PA-C [facility] 05/03/2024, 11:55 AM  Patient Care Team: System, Provider Not In as PCP - General Avram Lupita BRAVO, MD as Consulting Physician (Gastroenterology) Alvia Dorothyann LABOR, MD as Consulting Physician (Gynecology) Georjean Darice HERO, MD as Consulting Physician (Neurology) Margrette Burnard JULIANNA KEN as Social Worker

## 2024-05-03 NOTE — Telephone Encounter (Signed)
 Left message that I would call back to discuss his mother's situation

## 2024-05-03 NOTE — Patient Instructions (Signed)
Your provider has requested that you go to the basement level for lab work before leaving today. Press "B" on the elevator. The lab is located at the first door on the left as you exit the elevator.  

## 2024-05-04 NOTE — Telephone Encounter (Signed)
 Spoke to [name], and he opted for a conservative approach.  Here is the plan:  #1 start ferrous sulfate 325 mg daily  #2 restart budesonide  3 mg capsules take 3 (9 mg) daily  #3 need to have nursing home order these, I do not know if they need prescriptions or we just need to send orders  #4 repeat CBC 1 week send results to me  #5 schedule follow-up me in January 6 nurse visit spot at 1:30 PM

## 2024-05-05 NOTE — Telephone Encounter (Signed)
 Called and spoke with Sonny, nurse at [facility]. Discussed the new POC. She reports that the pt has not stopped taking the Budesonide  so they will continue that medication. They will place the orders for the Ferrous Sulfate 325 mg. Orders given to repeat CBC in 1 week, fax number given so the results can be faxed over to us . Discussed f/u visit for 06/16/23 at 1330. Sonny verbalized all understanding.

## 2024-05-07 ENCOUNTER — Inpatient Hospital Stay (HOSPITAL_COMMUNITY)
Admission: EM | Admit: 2024-05-07 | Discharge: 2024-05-17 | DRG: 871 | Disposition: A | Discharge status: Skilled Nursing Facility | Payer: Medicare (Managed Care) | Source: Skilled Nursing Facility | Attending: Internal Medicine | Admitting: Internal Medicine

## 2024-05-07 ENCOUNTER — Emergency Department (HOSPITAL_COMMUNITY): Payer: Medicare (Managed Care)

## 2024-05-07 ENCOUNTER — Other Ambulatory Visit: Payer: Self-pay

## 2024-05-07 DIAGNOSIS — K922 Gastrointestinal hemorrhage, unspecified: Secondary | ICD-10-CM | POA: Diagnosis not present

## 2024-05-07 DIAGNOSIS — E43 Unspecified severe protein-calorie malnutrition: Secondary | ICD-10-CM | POA: Insufficient documentation

## 2024-05-07 DIAGNOSIS — R195 Other fecal abnormalities: Secondary | ICD-10-CM

## 2024-05-07 DIAGNOSIS — K5792 Diverticulitis of intestine, part unspecified, without perforation or abscess without bleeding: Principal | ICD-10-CM

## 2024-05-07 DIAGNOSIS — R935 Abnormal findings on diagnostic imaging of other abdominal regions, including retroperitoneum: Secondary | ICD-10-CM

## 2024-05-07 DIAGNOSIS — D500 Iron deficiency anemia secondary to blood loss (chronic): Secondary | ICD-10-CM

## 2024-05-07 DIAGNOSIS — Z8719 Personal history of other diseases of the digestive system: Secondary | ICD-10-CM

## 2024-05-07 LAB — CBC WITH DIFFERENTIAL/PLATELET
Abs Immature Granulocytes: 0.08 K/uL — ABNORMAL HIGH (ref 0.00–0.07)
Basophils Absolute: 0 K/uL (ref 0.0–0.1)
Basophils Relative: 0 %
Eosinophils Absolute: 0 K/uL (ref 0.0–0.5)
Eosinophils Relative: 0 %
HCT: 24.8 % — ABNORMAL LOW (ref 36.0–46.0)
Hemoglobin: 7 g/dL — ABNORMAL LOW (ref 12.0–15.0)
Immature Granulocytes: 1 %
Lymphocytes Relative: 7 %
Lymphs Abs: 0.8 K/uL (ref 0.7–4.0)
MCH: 23.8 pg — ABNORMAL LOW (ref 26.0–34.0)
MCHC: 28.2 g/dL — ABNORMAL LOW (ref 30.0–36.0)
MCV: 84.4 fL (ref 80.0–100.0)
Monocytes Absolute: 0.6 K/uL (ref 0.1–1.0)
Monocytes Relative: 6 %
Neutro Abs: 9.7 K/uL — ABNORMAL HIGH (ref 1.7–7.7)
Neutrophils Relative %: 86 %
Platelets: 320 K/uL (ref 150–400)
RBC: 2.94 MIL/uL — ABNORMAL LOW (ref 3.87–5.11)
RDW: 15.2 % (ref 11.5–15.5)
WBC: 11.3 K/uL — ABNORMAL HIGH (ref 4.0–10.5)
nRBC: 0 % (ref 0.0–0.2)

## 2024-05-07 LAB — I-STAT CG4 LACTIC ACID, ED: Lactic Acid, Venous: 2.5 mmol/L (ref 0.5–1.9)

## 2024-05-07 LAB — POC OCCULT BLOOD, ED: Fecal Occult Bld: POSITIVE — AB

## 2024-05-07 LAB — COMPREHENSIVE METABOLIC PANEL WITH GFR
ALT: 13 U/L (ref 0–44)
AST: 19 U/L (ref 15–41)
Albumin: 2.4 g/dL — ABNORMAL LOW (ref 3.5–5.0)
Alkaline Phosphatase: 71 U/L (ref 38–126)
Anion gap: 10 (ref 5–15)
BUN: 34 mg/dL — ABNORMAL HIGH (ref 8–23)
CO2: 20 mmol/L — ABNORMAL LOW (ref 22–32)
Calcium: 8 mg/dL — ABNORMAL LOW (ref 8.9–10.3)
Chloride: 113 mmol/L — ABNORMAL HIGH (ref 98–111)
Creatinine, Ser: 1.02 mg/dL — ABNORMAL HIGH (ref 0.44–1.00)
GFR, Estimated: 53 mL/min — ABNORMAL LOW (ref >60)
Glucose, Bld: 178 mg/dL — ABNORMAL HIGH (ref 70–99)
Potassium: 3 mmol/L — ABNORMAL LOW (ref 3.5–5.1)
Sodium: 143 mmol/L (ref 135–145)
Total Bilirubin: 0.4 mg/dL (ref 0.0–1.2)
Total Protein: 5.5 g/dL — ABNORMAL LOW (ref 6.5–8.1)

## 2024-05-07 LAB — I-STAT VENOUS BLOOD GAS, ED
Acid-base deficit: 2 mmol/L (ref 0.0–2.0)
Bicarbonate: 22.2 mmol/L (ref 20.0–28.0)
Calcium, Ion: 1.15 mmol/L (ref 1.15–1.40)
HCT: 23 % — ABNORMAL LOW (ref 36.0–46.0)
Hemoglobin: 7.8 g/dL — ABNORMAL LOW (ref 12.0–15.0)
O2 Saturation: 95 %
Potassium: 3 mmol/L — ABNORMAL LOW (ref 3.5–5.1)
Sodium: 147 mmol/L — ABNORMAL HIGH (ref 135–145)
TCO2: 23 mmol/L (ref 22–32)
pCO2, Ven: 34 mmHg — ABNORMAL LOW (ref 44–60)
pH, Ven: 7.423 (ref 7.25–7.43)
pO2, Ven: 75 mmHg — ABNORMAL HIGH (ref 32–45)

## 2024-05-07 LAB — RESP PANEL BY RT-PCR (RSV, FLU A&B, COVID)  RVPGX2
Influenza A by PCR: NEGATIVE
Influenza B by PCR: NEGATIVE
Resp Syncytial Virus by PCR: NEGATIVE
SARS Coronavirus 2 by RT PCR: NEGATIVE

## 2024-05-07 LAB — TSH: TSH: 1.506 u[IU]/mL (ref 0.350–4.500)

## 2024-05-07 LAB — I-STAT CHEM 8, ED
BUN: 32 mg/dL — ABNORMAL HIGH (ref 8–23)
Calcium, Ion: 1.16 mmol/L (ref 1.15–1.40)
Chloride: 112 mmol/L — ABNORMAL HIGH (ref 98–111)
Creatinine, Ser: 1.1 mg/dL — ABNORMAL HIGH (ref 0.44–1.00)
Glucose, Bld: 171 mg/dL — ABNORMAL HIGH (ref 70–99)
HCT: 23 % — ABNORMAL LOW (ref 36.0–46.0)
Hemoglobin: 7.8 g/dL — ABNORMAL LOW (ref 12.0–15.0)
Potassium: 3 mmol/L — ABNORMAL LOW (ref 3.5–5.1)
Sodium: 147 mmol/L — ABNORMAL HIGH (ref 135–145)
TCO2: 21 mmol/L — ABNORMAL LOW (ref 22–32)

## 2024-05-07 LAB — MAGNESIUM: Magnesium: 2.1 mg/dL (ref 1.7–2.4)

## 2024-05-07 LAB — CBG MONITORING, ED: Glucose-Capillary: 178 mg/dL — ABNORMAL HIGH (ref 70–99)

## 2024-05-07 MED ORDER — IOHEXOL 350 MG/ML SOLN
75.0000 mL | Freq: Once | INTRAVENOUS | Status: AC | PRN
  Administered 2024-05-07: 75 mL via INTRAVENOUS

## 2024-05-07 MED ORDER — PIPERACILLIN-TAZOBACTAM 3.375 G IVPB 30 MIN
3.3750 g | Freq: Once | INTRAVENOUS | Status: AC
  Administered 2024-05-07: 3.375 g via INTRAVENOUS
  Filled 2024-05-07: qty 50

## 2024-05-07 MED ORDER — SODIUM CHLORIDE 0.9 % IV SOLN
2.0000 g | Freq: Two times a day (BID) | INTRAVENOUS | Status: DC
  Filled 2024-05-07: qty 12.5

## 2024-05-07 MED ORDER — SODIUM CHLORIDE 0.9 % IV SOLN: 2.0000 g | INTRAVENOUS | Status: DC

## 2024-05-07 MED ORDER — POTASSIUM CHLORIDE CRYS ER 20 MEQ PO TBCR
20.0000 meq | EXTENDED_RELEASE_TABLET | Freq: Once | ORAL | Status: AC
  Administered 2024-05-07: 20 meq via ORAL
  Filled 2024-05-07: qty 1

## 2024-05-07 MED ORDER — SODIUM CHLORIDE 0.9 % IV BOLUS
1000.0000 mL | Freq: Once | INTRAVENOUS | Status: AC
Start: 2024-05-07 — End: 2024-05-07
  Administered 2024-05-07: 1000 mL via INTRAVENOUS

## 2024-05-07 MED ORDER — VANCOMYCIN HCL IN DEXTROSE 1-5 GM/200ML-% IV SOLN
1000.0000 mg | Freq: Once | INTRAVENOUS | Status: AC
  Administered 2024-05-07: 1000 mg via INTRAVENOUS
  Filled 2024-05-07: qty 200

## 2024-05-07 MED ORDER — IPRATROPIUM-ALBUTEROL 0.5-2.5 (3) MG/3ML IN SOLN
3.0000 mL | Freq: Once | RESPIRATORY_TRACT | Status: AC
  Administered 2024-05-07: 3 mL via RESPIRATORY_TRACT
  Filled 2024-05-07: qty 3

## 2024-05-07 MED ORDER — POTASSIUM CHLORIDE 10 MEQ/100ML IV SOLN
10.0000 meq | Freq: Once | INTRAVENOUS | Status: AC
  Administered 2024-05-08: 10 meq via INTRAVENOUS
  Filled 2024-05-07: qty 100

## 2024-05-07 MED ORDER — SODIUM CHLORIDE 0.9% IV SOLUTION: Freq: Once | INTRAVENOUS | Status: DC

## 2024-05-07 NOTE — H&P (Signed)
 History and Physical    [name] FMW:[id number] DOB: 1933/03/25 DOA: 05/07/2024  Referring [name]/NP/PA: *** PCP: System, Provider Not In (Confirm with patient/family/NH records and if not entered, this has to be entered at [facility] point of entry) Outpatient Specialists: *** Consulting Civil Engineer speciality and name if known) Patient coming from: ***  Chief Complaint: ***  HPI: [name] is a 89 y.o. female with medical history significant of *** (For level 3, the HPI must include 4+ descriptors: Location, Quality, Severity, Duration, Timing, Context, modifying factors, associated signs/symptoms and/or status of 3+ chronic problems.)  (Please avoid self-populating past medical history here) (The initial 2-3 lines should be focused and good to copy and paste in the HPI section of the daily progress note).  ED Course: ***  Blood transfusion x1 unit  Medications Ordered in the ED  0.9 %  sodium chloride  infusion (Manually program via Guardrails IV Fluids) (has no administration in time range)  sodium chloride  0.9 % bolus 1,000 mL (0 mLs Intravenous Stopped 05/07/24 2225)  vancomycin (VANCOCIN) IVPB 1000 mg/200 mL premix (0 mg Intravenous Stopped 05/07/24 2225)  piperacillin-tazobactam (ZOSYN) IVPB 3.375 g (0 g Intravenous Stopped 05/07/24 2101)  potassium chloride  SA (KLOR-CON  M) CR tablet 20 mEq (20 mEq Oral Given 05/07/24 2119)  iohexol  (OMNIPAQUE ) 350 MG/ML injection 75 mL (75 mLs Intravenous Contrast Given 05/07/24 2100)  ipratropium-albuterol (DUONEB) 0.5-2.5 (3) MG/3ML nebulizer solution 3 mL (3 mLs Nebulization Given 05/07/24 2230)   Labs Reviewed  CBC WITH DIFFERENTIAL/PLATELET - Abnormal; Notable for the following components:      Result Value   WBC 11.3 (*)    RBC 2.94 (*)    Hemoglobin 7.0 (*)    HCT 24.8 (*)    MCH 23.8 (*)    MCHC 28.2 (*)    Neutro Abs 9.7 (*)    Abs Immature Granulocytes 0.08 (*)    All other components within normal limits  COMPREHENSIVE METABOLIC PANEL  WITH GFR - Abnormal; Notable for the following components:   Potassium 3.0 (*)    Chloride 113 (*)    CO2 20 (*)    Glucose, Bld 178 (*)    BUN 34 (*)    Creatinine, Ser 1.02 (*)    Calcium  8.0 (*)    Total Protein 5.5 (*)    Albumin 2.4 (*)    GFR, Estimated 53 (*)    All other components within normal limits  CBG MONITORING, ED - Abnormal; Notable for the following components:   Glucose-Capillary 178 (*)    All other components within normal limits  I-STAT CG4 LACTIC ACID, ED - Abnormal; Notable for the following components:   Lactic Acid, Venous 2.5 (*)    All other components within normal limits  I-STAT VENOUS BLOOD GAS, ED - Abnormal; Notable for the following components:   pCO2, Ven 34.0 (*)    pO2, Ven 75 (*)    Sodium 147 (*)    Potassium 3.0 (*)    HCT 23.0 (*)    Hemoglobin 7.8 (*)    All other components within normal limits  I-STAT CHEM 8, ED - Abnormal; Notable for the following components:   Sodium 147 (*)    Potassium 3.0 (*)    Chloride 112 (*)    BUN 32 (*)    Creatinine, Ser 1.10 (*)    Glucose, Bld 171 (*)    TCO2 21 (*)    Hemoglobin 7.8 (*)    HCT 23.0 (*)  All other components within normal limits  POC OCCULT BLOOD, ED - Abnormal; Notable for the following components:   Fecal Occult Bld POSITIVE (*)    All other components within normal limits  RESP PANEL BY RT-PCR (RSV, FLU A&B, COVID)  RVPGX2  CULTURE, BLOOD (ROUTINE X 2)  CULTURE, BLOOD (ROUTINE X 2)  MAGNESIUM  TSH  URINALYSIS, ROUTINE W REFLEX MICROSCOPIC  LACTIC ACID, PLASMA  POC OCCULT BLOOD, ED  TYPE AND SCREEN  PREPARE RBC (CROSSMATCH)    Review of Systems: As per HPI otherwise 10 point review of systems negative.  Unacceptable ROS statements: "10 systems reviewed," "Extensive" (without elaboration).  Acceptable ROS statements: "All others negative," "All others reviewed and are negative," and "All others unremarkable," with at LEAST ONE ROS documented Can't double dip - if  using for HPI can't use for ROS  Past Medical History:  Diagnosis Date   Acute diastolic (congestive) heart failure (HCC)    Bladder prolapse, female, acquired    pessary in   Chronic diastolic (congestive) heart failure (HCC)    Chronic pain syndrome    Complete uterine prolapse    Crohn's disease (HCC) 2010   Diverticulosis    E. coli UTI 06/09/2013   Fall 05/2013   Hemorrhoids    History of hypercalcemia    History of hyperglycemia    Hydronephrosis    Hyperparathyroidism    Hypertension    Multiple pelvic fractures (HCC) 07/02/2013   Osteoporosis    Pubic ramus fracture (HCC)    Urinary incontinence 09/09/2011   Vitamin D  deficiency     Past Surgical History:  Procedure Laterality Date   COLONOSCOPY  12/23/2008   crohn's, diverticulosis, internal hemorrhoids   HIP FRACTURE SURGERY Left    left, prosthesis   LAPAROSCOPY  10/2009   PARATHYROIDECTOMY Left 08/30/2013   Procedure: LEFT INFERIOR PARATHYROIDECTOMY;  Surgeon: [name] Caitlyn Spinner, [name];  Location: [facility];  Service: General;  Laterality: Left;   RHINOPLASTY     TONSILLECTOMY       reports that she has never smoked. She has never used smokeless tobacco. She reports current alcohol use. She reports that she does not use drugs.  Allergies  Allergen Reactions   Other     Family History  Problem Relation Age of Onset   Heart disease Father    Colon cancer Father    Osteoporosis Mother    Breast cancer Mother    Prostate cancer Brother    Unacceptable: Noncontributory, unremarkable, or negative. Acceptable: Family history reviewed and not pertinent (If you reviewed it)  Prior to Admission medications   Medication Sig Start Date End Date Taking? Authorizing Provider  acetaminophen  (TYLENOL ) 325 MG tablet Take 2 tablets (650 mg total) by mouth every 6 (six) hours. 01/25/20   [name]  amLODipine  (NORVASC ) 2.5 MG tablet Take 2.5 mg by mouth daily. 09/03/19   Provider, Historical, [name]  budesonide  (ENTOCORT  EC) 3 MG 24 hr capsule Take 3 capsules (9 mg total) by mouth daily. 11/16/18   [name]  calcium -vitamin D  (OSCAL WITH D) 500-200 MG-UNIT tablet Take 1 tablet by mouth 2 (two) times daily.    Provider, Historical, [name]  docusate sodium  (COLACE) 100 MG capsule Take 1 capsule (100 mg total) by mouth 2 (two) times daily. 01/25/20   [name]  donepezil  (ARICEPT ) 10 MG tablet Take 1 tablet daily 11/16/18   Georjean Darice CHRISTELLA, [name]  hydrALAZINE  (APRESOLINE ) 25 MG tablet Take  1 tablet (25 mg total) by mouth every 8 (eight) hours. Patient taking differently: Take 25 mg by mouth 3 (three) times daily.  05/11/18   [name]  lidocaine  (LIDODERM ) 5 % Place 1 patch onto the skin daily. Remove & Discard patch within 12 hours or as directed by [name] 01/25/20   [name]  losartan  (COZAAR ) 25 MG tablet Take 1 tablet by mouth once daily Patient taking differently: Take 25 mg by mouth daily.  12/15/18   Cleatus Arlyss RAMAN, [name]  methocarbamol  (ROBAXIN ) 500 MG tablet Take 1 tablet (500 mg total) by mouth 3 (three) times daily. 01/25/20   [name]  Multiple Vitamin (MULTIVITAMIN) tablet Take 1 tablet by mouth daily.    Provider, Historical, [name]  sertraline  (ZOLOFT ) 25 MG tablet Take 1 tablet by mouth once daily Patient taking differently: Take 25 mg by mouth daily.  08/13/18   [name]    Physical Exam: Vitals:   05/07/24 2015 05/07/24 2130 05/07/24 2216 05/07/24 2231  BP: 111/88 110/69 104/69 111/76  Pulse: 96 87 97 90  Resp: (!) 21 20 (!) 21 (!) 22  Temp:   (!) 97.4 F (36.3 C) 97.6 F (36.4 C)  TempSrc:   Oral Oral  SpO2: 96% 98%        Constitutional: NAD, calm, comfortable Vitals:   05/07/24 2015 05/07/24 2130 05/07/24 2216 05/07/24 2231  BP: 111/88 110/69 104/69 111/76  Pulse: 96 87 97 90  Resp: (!) 21 20 (!) 21 (!) 22  Temp:   (!) 97.4 F (36.3 C) 97.6 F (36.4 C)  TempSrc:   Oral Oral  SpO2: 96% 98%     Eyes: PERRL, lids and conjunctivae  normal ENMT: Mucous membranes are moist. Posterior pharynx clear of any exudate or lesions.Normal dentition.  Neck: normal, supple, no masses, no thyromegaly Respiratory: clear to auscultation bilaterally, no wheezing, no crackles. Normal respiratory effort. No accessory muscle use.  Cardiovascular: Regular rate and rhythm, no murmurs / rubs / gallops. No extremity edema. 2+ pedal pulses. No carotid bruits.  Abdomen: no tenderness, no masses palpated. No hepatosplenomegaly. Bowel sounds positive.  Musculoskeletal: no clubbing / cyanosis. No joint deformity upper and lower extremities. Good ROM, no contractures. Normal muscle tone.  Skin: no rashes, lesions, ulcers. No induration Neurologic: CN 2-12 grossly intact. Sensation intact, DTR normal. Strength 5/5 in all 4.  Psychiatric: Normal judgment and insight. Alert and oriented x 3. Normal mood.   (Anything < 9 systems with 2 bullets each down codes to level 1) (If patient refuses exam can't bill higher level) (Make sure to document decubitus ulcers present on admission -- if possible -- and whether patient has chronic indwelling catheter at time of admission)  Labs on Admission: I have personally reviewed following labs and imaging studies  CBC: Recent Labs  Lab 05/03/24 1142 05/07/24 1920 05/07/24 1931  WBC 5.9 11.3*  --   NEUTROABS 4.4 9.7*  --   HGB 7.8 Repeated and verified X2.* 7.0* 7.8*  7.8*  HCT 24.6 Repeated and verified X2.* 24.8* 23.0*  23.0*  MCV 77.6* 84.4  --   PLT 300.0 320  --    Basic Metabolic Panel: Recent Labs  Lab 05/03/24 1142 05/07/24 1920 05/07/24 1931  NA 140 143 147*  147*  K 3.9 3.0* 3.0*  3.0*  CL 108 113* 112*  CO2 27 20*  --   GLUCOSE 93 178* 171*  BUN 19 34* 32*  CREATININE 0.65 1.02* 1.10*  CALCIUM  8.9 8.0*  --   MG  --  2.1  --    GFR: CrCl cannot be calculated (Unknown ideal weight.). Liver Function Tests: Recent Labs  Lab 05/03/24 1142 05/07/24 1920  AST 12 19  ALT 10 13   ALKPHOS 80 71  BILITOT 0.6 0.4  PROT 6.3 5.5*  ALBUMIN 3.3* 2.4*   No results for input(s): LIPASE, AMYLASE in the last 168 hours. No results for input(s): AMMONIA in the last 168 hours. Coagulation Profile: No results for input(s): INR, PROTIME in the last 168 hours. Cardiac Enzymes: No results for input(s): CKTOTAL, CKMB, CKMBINDEX, TROPONINI in the last 168 hours. BNP (last 3 results) No results for input(s): PROBNP in the last 8760 hours. HbA1C: No results for input(s): HGBA1C in the last 72 hours. CBG: Recent Labs  Lab 05/07/24 1937  GLUCAP 178*   Lipid Profile: No results for input(s): CHOL, HDL, LDLCALC, TRIG, CHOLHDL, LDLDIRECT in the last 72 hours. Thyroid  Function Tests: Recent Labs    05/07/24 1920  TSH 1.506   Anemia Panel: No results for input(s): VITAMINB12, FOLATE, FERRITIN, TIBC, IRON, RETICCTPCT in the last 72 hours. Urine analysis:    Component Value Date/Time   COLORURINE YELLOW 12/03/2017 2034   APPEARANCEUR CLOUDY (A) 12/03/2017 2034   LABSPEC 1.013 12/03/2017 2034   PHURINE 8.0 12/03/2017 2034   GLUCOSEU NEGATIVE 12/03/2017 2034   HGBUR NEGATIVE 12/03/2017 2034   BILIRUBINUR NEGATIVE 12/03/2017 2034   BILIRUBINUR + 07/14/2015 1655   KETONESUR NEGATIVE 12/03/2017 2034   PROTEINUR 100 (A) 12/03/2017 2034   UROBILINOGEN negative 07/14/2015 1655   UROBILINOGEN 0.2 05/16/2013 1434   NITRITE NEGATIVE 12/03/2017 2034   LEUKOCYTESUR LARGE (A) 12/03/2017 2034   Sepsis Labs: @LABRCNTIP (procalcitonin:4,lacticidven:4) ) Recent Results (from the past 240 hours)  Resp panel by RT-PCR (RSV, Flu A&B, Covid) Anterior Nasal Swab     Status: None   Collection Time: 05/07/24  7:06 PM   Specimen: Anterior Nasal Swab  Result Value Ref Range Status   SARS Coronavirus 2 by RT PCR NEGATIVE NEGATIVE Final   Influenza A by PCR NEGATIVE NEGATIVE Final   Influenza B by PCR NEGATIVE NEGATIVE Final    Comment:  (NOTE) The Xpert Xpress SARS-CoV-2/FLU/RSV plus assay is intended as an aid in the diagnosis of influenza from Nasopharyngeal swab specimens and should not be used as a sole basis for treatment. Nasal washings and aspirates are unacceptable for Xpert Xpress SARS-CoV-2/FLU/RSV testing.  Fact Sheet for Patients: bloggercourse.com  Fact Sheet for Healthcare Providers: seriousbroker.it  This test is not yet approved or cleared by the [place]  FDA and has been authorized for detection and/or diagnosis of SARS-CoV-2 by FDA under an Emergency Use Authorization (EUA). This EUA will remain in effect (meaning this test can be used) for the duration of the COVID-19 declaration under Section 564(b)(1) of the Act, 21 U.S.C. section 360bbb-3(b)(1), unless the authorization is terminated or revoked.     Resp Syncytial Virus by PCR NEGATIVE NEGATIVE Final    Comment: (NOTE) Fact Sheet for Patients: bloggercourse.com  Fact Sheet for Healthcare Providers: seriousbroker.it  This test is not yet approved or cleared by the [place]  FDA and has been authorized for detection and/or diagnosis of SARS-CoV-2 by FDA under an Emergency Use Authorization (EUA). This EUA will remain in effect (meaning this test can be used) for the duration of the COVID-19 declaration under Section 564(b)(1) of the Act, 21 U.S.C. section 360bbb-3(b)(1), unless the authorization  is terminated or revoked.  Performed at [facility], [address] [place], [place] [address]      Radiological Exams on Admission: CT Angio Chest PE W and/or Wo Contrast Result Date: 05/07/2024 EXAM: CTA CHEST PE WITH CONTRAST CT ABDOMEN AND PELVIS WITH CONTRAST 05/07/2024 08:44:00 PM TECHNIQUE: CTA of the chest was performed after the administration of 75 mL of iohexol  (OMNIPAQUE ) 350 MG/ML intravenous contrast. Multiplanar  reformatted images are provided for review. MIP images are provided for review. CT of the abdomen and pelvis was performed with the administration of intravenous contrast. Automated exposure control, iterative reconstruction, and/or weight based adjustment of the mA/kV was utilized to reduce the radiation dose to as low as reasonably achievable. COMPARISON: AP portable pelvis 12/04/2023, chest AP portable 12/04/2023, chest, abdomen, and pelvis CT with IV contrast 01/18/2020. CLINICAL HISTORY: Workup for PE. SOB. New O2 requirement. History of prior Crohn disease and history of ileitis, drop in hemoglobin, assess for abdominal hemorrhage or inflammation. FINDINGS: CHEST: PULMONARY ARTERIES: Pulmonary arteries are upper normal for caliber. The subsegmental arterial bed is largely obscured due to respiratory motion, but no arterial embolus is seen at least to the segmental level. There is no substantial pericardial effusion. The pulmonary veins are nondilated. MEDIASTINUM: There is a heterogeneous nodule of the inferior pole of the left lobe of the thyroid  gland measuring 1.6 cm, previously 1 cm. Nonemergent follow-up thyroid  ultrasound is recommended if clinically warranted taking into account advanced age and life expectancy. There is mild cardiomegaly, interval moderate increased thickening in the left ventricle wall and septum, query hypertrophic cardiomyopathy. Left ventricular free wall thickness is 1.7 cm. There are patchy 3-vessel coronary calcifications. Tortuosity in the great vessels and aorta with moderate aortic atherosclerosis without aneurysm or dissection. There is a patulous upper thoracic esophagus above the carina level with fluid filling. Consider aspiration precautions unless already being done. . No intrathoracic adenopathy is seen. LUNGS AND PLEURA: The trachea and central airways are small in caliber which could be due to active respiration or bronchospasm. There is diffuse bronchial thickening  right middle and both lower lobes. Symmetric small layering bilateral pleural effusions with adjacent consolidation or atelectasis in the lower lobes. Mild bilateral apical scarring change noted. The lungs are otherwise clear with evidence of mild volume loss of the right hemithorax and multiple chronic healed right rib cage fracture deformities deforming the right chest wall. There is no overt pulmonary edema. No pneumothorax. SOFT TISSUES AND BONES: There are bilateral breast implants with thick capsular calcifications. Axillary spaces are clear. There is mild to moderate thoracic kyphodextroscoliosis with osteopenia and degenerative changes of the spine. There are additional healed fractures of the left anterolateral ribs. Multiple chronic healed right rib cage fracture deformities deforming the right chest wall. ABDOMEN AND PELVIS: LIVER: The liver is unremarkable. GALLBLADDER AND BILE DUCTS: Gallbladder is unremarkable. No biliary ductal dilatation. SPLEEN: Spleen demonstrates no acute abnormality. PANCREAS: Pancreas demonstrates no acute abnormality. ADRENAL GLANDS: Adrenal glands demonstrate no acute abnormality. There is no adrenal mass. KIDNEYS, URETERS AND BLADDER: No renal mass. Scattered left renal scarring. 4 mm calyceal stone mid pole left kidney noted. No other nephrolithiasis. No obstructing stones. Bilateral generalized renal cortical thinning is seen. No hydronephrosis. No perinephric or periureteral stranding. The bladder demonstrates asymmetric thickening and mucosal enhancement to the right warranting urologic referral and consideration of cystoscopy. Cystitis or neoplasm could have this appearance. GI AND BOWEL: The stomach and small bowel are unremarkable. An appendix is not  seen. There is a mobile cecum protruding into a new midline infraumbilical wide mouth hernia, but no evidence of hernia strangulation or bowel obstruction. There is advanced diverticulosis in the distal descending and  sigmoid colon. There is faint stranding adjacent to the distal descending sigmoid concerning for mild acute diverticulitis and not seen previously. There is moderate retained stool in the ascending colon. No abnormal bowel wall thickening or distension. REPRODUCTIVE: The uterus and right ovary are unremarkable. There is a stable 2.2 cm simple appearing left ovarian cyst, hounsfield density is 11.3. No follow-up imaging is recommended. There is spray artifact from a vaginal pessary. PERITONEUM AND RETROPERITONEUM: No  free air, free hemorrhage, or abscess. There is minimal perihepatic ascites. LYMPH NODES: No lymphadenopathy. BONES AND SOFT TISSUES: There is artifact from a left hip arthroplasty, which was seen previously. There is osteopenia, moderate to severe broad-based lumbar levoscoliosis, and degenerative change of the lumbar spine. No focal soft tissue abnormality. IMPRESSION: 1. No evidence of pulmonary embolism to the segmental level, acknowledging motion-limited evaluation. 2. Mild acute diverticulitis in the distal descending colon. No abscess or free air. 3. Asymmetric right-sided bladder wall thickening with mucosal enhancement, recommend urologic referral and consideration of cystoscopy given differential of cystitis versus neoplasm. 4. 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 5. Thyroid  nodule in the inferior left lobe measuring 1.6 cm, recommend nonemergent thyroid  ultrasound if clinically warranted given advanced age. 6. Small pleural effusions with adjacent consolidation or atelectasis in the lower lobes . 7. Small caliber central airways consistent with bronchospasm or active respiration. 8. Increasing thickening in the left ventricular wall and cardiac septum. Query hypertrophic cardiomyopathy. 9. Aortic and coronary artery atherosclerosis. 10. S-shaped thoracolumbar scoliosis, osteopenia, and degenerative  changes . SABRA Electronically signed by: Francis Quam [name] 05/07/2024 10:10 PM EST RP Workstation: [id number]   CT ABDOMEN PELVIS W CONTRAST Result Date: 05/07/2024 EXAM: CTA CHEST PE WITH CONTRAST CT ABDOMEN AND PELVIS WITH CONTRAST 05/07/2024 08:44:00 PM TECHNIQUE: CTA of the chest was performed after the administration of 75 mL of iohexol  (OMNIPAQUE ) 350 MG/ML intravenous contrast. Multiplanar reformatted images are provided for review. MIP images are provided for review. CT of the abdomen and pelvis was performed with the administration of intravenous contrast. Automated exposure control, iterative reconstruction, and/or weight based adjustment of the mA/kV was utilized to reduce the radiation dose to as low as reasonably achievable. COMPARISON: AP portable pelvis 12/04/2023, chest AP portable 12/04/2023, chest, abdomen, and pelvis CT with IV contrast 01/18/2020. CLINICAL HISTORY: Workup for PE. SOB. New O2 requirement. History of prior Crohn disease and history of ileitis, drop in hemoglobin, assess for abdominal hemorrhage or inflammation. FINDINGS: CHEST: PULMONARY ARTERIES: Pulmonary arteries are upper normal for caliber. The subsegmental arterial bed is largely obscured due to respiratory motion, but no arterial embolus is seen at least to the segmental level. There is no substantial pericardial effusion. The pulmonary veins are nondilated. MEDIASTINUM: There is a heterogeneous nodule of the inferior pole of the left lobe of the thyroid  gland measuring 1.6 cm, previously 1 cm. Nonemergent follow-up thyroid  ultrasound is recommended if clinically warranted taking into account advanced age and life expectancy. There is mild cardiomegaly, interval moderate increased thickening in the left ventricle wall and septum, query hypertrophic cardiomyopathy. Left ventricular free wall thickness is 1.7 cm. There are patchy 3-vessel coronary calcifications. Tortuosity in the great vessels and aorta with moderate aortic  atherosclerosis without aneurysm or dissection. There is  a patulous upper thoracic esophagus above the carina level with fluid filling. Consider aspiration precautions unless already being done. . No intrathoracic adenopathy is seen. LUNGS AND PLEURA: The trachea and central airways are small in caliber which could be due to active respiration or bronchospasm. There is diffuse bronchial thickening right middle and both lower lobes. Symmetric small layering bilateral pleural effusions with adjacent consolidation or atelectasis in the lower lobes. Mild bilateral apical scarring change noted. The lungs are otherwise clear with evidence of mild volume loss of the right hemithorax and multiple chronic healed right rib cage fracture deformities deforming the right chest wall. There is no overt pulmonary edema. No pneumothorax. SOFT TISSUES AND BONES: There are bilateral breast implants with thick capsular calcifications. Axillary spaces are clear. There is mild to moderate thoracic kyphodextroscoliosis with osteopenia and degenerative changes of the spine. There are additional healed fractures of the left anterolateral ribs. Multiple chronic healed right rib cage fracture deformities deforming the right chest wall. ABDOMEN AND PELVIS: LIVER: The liver is unremarkable. GALLBLADDER AND BILE DUCTS: Gallbladder is unremarkable. No biliary ductal dilatation. SPLEEN: Spleen demonstrates no acute abnormality. PANCREAS: Pancreas demonstrates no acute abnormality. ADRENAL GLANDS: Adrenal glands demonstrate no acute abnormality. There is no adrenal mass. KIDNEYS, URETERS AND BLADDER: No renal mass. Scattered left renal scarring. 4 mm calyceal stone mid pole left kidney noted. No other nephrolithiasis. No obstructing stones. Bilateral generalized renal cortical thinning is seen. No hydronephrosis. No perinephric or periureteral stranding. The bladder demonstrates asymmetric thickening and mucosal enhancement to the right warranting  urologic referral and consideration of cystoscopy. Cystitis or neoplasm could have this appearance. GI AND BOWEL: The stomach and small bowel are unremarkable. An appendix is not seen. There is a mobile cecum protruding into a new midline infraumbilical wide mouth hernia, but no evidence of hernia strangulation or bowel obstruction. There is advanced diverticulosis in the distal descending and sigmoid colon. There is faint stranding adjacent to the distal descending sigmoid concerning for mild acute diverticulitis and not seen previously. There is moderate retained stool in the ascending colon. No abnormal bowel wall thickening or distension. REPRODUCTIVE: The uterus and right ovary are unremarkable. There is a stable 2.2 cm simple appearing left ovarian cyst, hounsfield density is 11.3. No follow-up imaging is recommended. There is spray artifact from a vaginal pessary. PERITONEUM AND RETROPERITONEUM: No  free air, free hemorrhage, or abscess. There is minimal perihepatic ascites. LYMPH NODES: No lymphadenopathy. BONES AND SOFT TISSUES: There is artifact from a left hip arthroplasty, which was seen previously. There is osteopenia, moderate to severe broad-based lumbar levoscoliosis, and degenerative change of the lumbar spine. No focal soft tissue abnormality. IMPRESSION: 1. No evidence of pulmonary embolism to the segmental level, acknowledging motion-limited evaluation. 2. Mild acute diverticulitis in the distal descending colon. No abscess or free air. 3. Asymmetric right-sided bladder wall thickening with mucosal enhancement, recommend urologic referral and consideration of cystoscopy given differential of cystitis versus neoplasm. 4. 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 5. Thyroid  nodule in the inferior left lobe measuring 1.6 cm, recommend nonemergent thyroid  ultrasound if clinically warranted given advanced age. 6.  Small pleural effusions with adjacent consolidation or atelectasis in the lower lobes . 7. Small caliber central airways consistent with bronchospasm or active respiration. 8. Increasing thickening in the left ventricular wall and cardiac septum. Query hypertrophic cardiomyopathy. 9. Aortic and coronary artery atherosclerosis. 10. S-shaped thoracolumbar scoliosis, osteopenia, and degenerative changes . SABRA  Electronically signed by: Francis Quam [name] 05/07/2024 10:10 PM EST RP Workstation: [id number]   DG Chest Portable 1 View Result Date: 05/07/2024 CLINICAL DATA:  Sepsis workup. EXAM: PORTABLE CHEST 1 VIEW COMPARISON:  None Available. FINDINGS: The cardiac silhouette is mildly enlarged and unchanged in size. Multiple chronic right-sided rib fractures are seen with chronic right-sided volume loss also noted. There is marked severity calcification of the aortic arch. There is moderate severity left basilar scarring and/or atelectasis. Small bilateral pleural effusions are noted. No pneumothorax is identified. Multilevel degenerative changes are present throughout the thoracic spine. IMPRESSION: 1. Multiple chronic right-sided rib fractures with chronic right-sided volume loss. 2. Moderate severity left basilar scarring and/or atelectasis. 3. Small bilateral pleural effusions. Electronically Signed   By: Suzen Dials M.D.   On: 05/07/2024 20:33    EKG: Independently reviewed. ***  Assessment/Plan  Sepsis  encephalopathy  acute respiratory distress On CPAP in ED > 3 L O2  Anemia  GI bleed [facility] consulted by ED, plan to see 11/29 AM. Diverticulitis on CT  ***   DVT prophylaxis: *** (Lovenox /Heparin/SCD's/anticoagulated/None (if comfort care) Code Status: *** (Full/Partial (specify details) Family Communication: *** (Specify name, relationship. Do not write discussed with patient. Specify tel # if discussed over the phone) Disposition Plan: *** (specify when and where you expect  patient to be discharged) Consults called: *** (with names) Admission status: observation  Severity of Illness: The appropriate patient status for this patient is OBSERVATION. Observation status is judged to be reasonable and necessary in order to provide the required intensity of service to ensure the patient's safety. The patient's presenting symptoms, physical exam findings, and initial radiographic and laboratory data in the context of their medical condition is felt to place them at decreased risk for further clinical deterioration. Furthermore, it is anticipated that the patient will be medically stable for discharge from the hospital within 2 midnights of admission.   Betsey Caitlyn Helling [name] [facility] Pager [phone]  If 7PM-7AM, please contact night-coverage [email or web address] Password [facility]  05/07/2024, 10:43 PM

## 2024-05-07 NOTE — Progress Notes (Signed)
 ED Pharmacy Antibiotic Sign Off An antibiotic consult was received from an ED provider for vancomycin per pharmacy dosing for sepsis. A chart review was completed to assess appropriateness.  Cefepime 2g q24h ordered by ED provider.  The following one time order(s) were placed per pharmacy consult:  vancomycin 1000 mg x 1 dose  Further antibiotic and/or antibiotic pharmacy consults should be ordered by the admitting provider if indicated.   Thank you for allowing pharmacy to be a part of this patient's care.   Dorn Buttner, PharmD, BCPS 05/07/2024 7:50 PM ED Clinical Pharmacist -  [phone]

## 2024-05-07 NOTE — ED Notes (Signed)
 Patient transported to CT

## 2024-05-07 NOTE — ED Notes (Signed)
 Phlebotomy attempted to collect second blood culture. Unable to collect RN aware

## 2024-05-07 NOTE — ED Provider Notes (Signed)
 [place] EMERGENCY DEPARTMENT AT [facility] Provider Note   CSN: [id number] Arrival date & time: 05/07/24  1853     Patient presents with: Respiratory Distress   [name] is a 89 y.o. female.   HPI    Patient presents because of increased altered mental status.  History dementia.  EMS was called to nursing home in the setting of increasing altered mental status as well as concern for fever.  Per EMS, fever was noted to be 800.6 F at the nursing home.  Subsequently brought area and to ED.  Upon EMS arrival, she was tachypneic.  O2 saturation in low 90s.  Started on CPAP because of tachypnea and work of breathing.   Patient is able to tell me her name and location but not month.  She denies chest pain but does endorse shortness of breath.  Denies any abdominal pain.  She is not aware of any kind of nausea vomiting or diarrhea.  She is not aware of any kind of fevers.  She denies any kind of recent falls.  Previous medical history reviewed : Last seen in the ED in June 2025 because of fall.  Negative workup at that time.  Last admitted in 2021 in setting of fall.  Scapular fracture.  Right rib fractures.  Dementia.  Chronic diastolic CHF.past medical history of CHF, Crohn's disease, diverticulosis, bladder prolapse s/p pessary, hemorrhoids, hyperparathyroidism s/p parathyroidectomy, HTN, HLD, osteoporosis, vitamin D  deficiency, protein calorie malnutrition, CKD stage II.  Follow-up with [facility] on May 03, 2024.  CBC on 11/17 showed hemoglobin 7.8.  11/21 was hemoglobin of 8.  History of Crohn's disease.  On budesonide  9 mg daily.  Midline placed and reported baby some bright red blood per rectum.  Intermittent rectal bleeding for 1 week.  Last colonoscopy 2010.  History of Crohn's ileitis.  Possibly needed colonoscopy versus sigmoidoscopy in the hospital setting.   Prior to Admission medications   Medication Sig Start Date End Date Taking? Authorizing Provider   acetaminophen  (TYLENOL ) 325 MG tablet Take 2 tablets (650 mg total) by mouth every 6 (six) hours. 01/25/20   Vicci Burnard SAUNDERS, PA-C  amLODipine  (NORVASC ) 2.5 MG tablet Take 2.5 mg by mouth daily. 09/03/19   [provider]  budesonide  (ENTOCORT EC ) 3 MG 24 hr capsule Take 3 capsules (9 mg total) by mouth daily. 11/16/18   Avram Lupita BRAVO, MD  calcium -vitamin D  (OSCAL WITH D) 500-200 MG-UNIT tablet Take 1 tablet by mouth 2 (two) times daily.    [provider]  docusate sodium  (COLACE) 100 MG capsule Take 1 capsule (100 mg total) by mouth 2 (two) times daily. 01/25/20   Vicci Burnard SAUNDERS, PA-C  donepezil  (ARICEPT ) 10 MG tablet Take 1 tablet daily 11/16/18   Georjean Darice HERO, MD  hydrALAZINE  (APRESOLINE ) 25 MG tablet Take 1 tablet (25 mg total) by mouth every 8 (eight) hours. Patient taking differently: Take 25 mg by mouth 3 (three) times daily.  05/11/18   Avram Lupita BRAVO, MD  lidocaine  (LIDODERM ) 5 % Place 1 patch onto the skin daily. Remove & Discard patch within 12 hours or as directed by MD 01/25/20   Vicci Burnard SAUNDERS, PA-C  losartan  (COZAAR ) 25 MG tablet Take 1 tablet by mouth once daily Patient taking differently: Take 25 mg by mouth daily.  12/15/18   Cleatus Arlyss RAMAN, MD  methocarbamol  (ROBAXIN ) 500 MG tablet Take 1 tablet (500 mg total) by mouth 3 (three) times daily. 01/25/20  Vicci Sor R, PA-C  Multiple Vitamin (MULTIVITAMIN) tablet Take 1 tablet by mouth daily.    [provider]  sertraline  (ZOLOFT ) 25 MG tablet Take 1 tablet by mouth once daily Patient taking differently: Take 25 mg by mouth daily.  08/13/18   Avram Lupita BRAVO, MD    Allergies: Other    Review of Systems  Constitutional:  Negative for chills and fever.  HENT:  Negative for ear pain and sore throat.   Eyes:  Negative for pain and visual disturbance.  Respiratory:  Negative for cough and shortness of breath.   Cardiovascular:  Negative for chest pain and palpitations.  Gastrointestinal:  Negative  for abdominal pain and vomiting.  Genitourinary:  Negative for dysuria and hematuria.  Musculoskeletal:  Negative for arthralgias and back pain.  Skin:  Negative for color change and rash.  Neurological:  Negative for seizures and syncope.  All other systems reviewed and are negative.   Updated Vital Signs BP 111/76   Pulse 90   Temp 97.6 F (36.4 C) (Oral)   Resp (!) 22   SpO2 98%   Physical Exam Vitals and nursing note reviewed.  Constitutional:      General: She is not in acute distress.    Appearance: She is well-developed.     Comments: Alert to self and place but not time   HENT:     Head: Normocephalic and atraumatic.  Eyes:     Conjunctiva/sclera: Conjunctivae normal.  Cardiovascular:     Rate and Rhythm: Normal rate and regular rhythm.     Heart sounds: No murmur heard. Pulmonary:     Effort: Pulmonary effort is normal. No respiratory distress.     Breath sounds: Normal breath sounds.  Abdominal:     Palpations: Abdomen is soft.     Tenderness: There is no abdominal tenderness.  Musculoskeletal:        General: No swelling.     Cervical back: Neck supple.  Skin:    General: Skin is warm and dry.     Capillary Refill: Capillary refill takes less than 2 seconds.  Neurological:     Mental Status: She is alert.  Psychiatric:        Mood and Affect: Mood normal.     (all labs ordered are listed, but only abnormal results are displayed) Labs Reviewed  CBC WITH DIFFERENTIAL/PLATELET - Abnormal; Notable for the following components:      Result Value   WBC 11.3 (*)    RBC 2.94 (*)    Hemoglobin 7.0 (*)    HCT 24.8 (*)    MCH 23.8 (*)    MCHC 28.2 (*)    Neutro Abs 9.7 (*)    Abs Immature Granulocytes 0.08 (*)    All other components within normal limits  COMPREHENSIVE METABOLIC PANEL WITH GFR - Abnormal; Notable for the following components:   Potassium 3.0 (*)    Chloride 113 (*)    CO2 20 (*)    Glucose, Bld 178 (*)    BUN 34 (*)    Creatinine,  Ser 1.02 (*)    Calcium  8.0 (*)    Total Protein 5.5 (*)    Albumin 2.4 (*)    GFR, Estimated 53 (*)    All other components within normal limits  CBG MONITORING, ED - Abnormal; Notable for the following components:   Glucose-Capillary 178 (*)    All other components within normal limits  I-STAT CG4 LACTIC ACID, ED - Abnormal;  Notable for the following components:   Lactic Acid, Venous 2.5 (*)    All other components within normal limits  I-STAT VENOUS BLOOD GAS, ED - Abnormal; Notable for the following components:   pCO2, Ven 34.0 (*)    pO2, Ven 75 (*)    Sodium 147 (*)    Potassium 3.0 (*)    HCT 23.0 (*)    Hemoglobin 7.8 (*)    All other components within normal limits  I-STAT CHEM 8, ED - Abnormal; Notable for the following components:   Sodium 147 (*)    Potassium 3.0 (*)    Chloride 112 (*)    BUN 32 (*)    Creatinine, Ser 1.10 (*)    Glucose, Bld 171 (*)    TCO2 21 (*)    Hemoglobin 7.8 (*)    HCT 23.0 (*)    All other components within normal limits  POC OCCULT BLOOD, ED - Abnormal; Notable for the following components:   Fecal Occult Bld POSITIVE (*)    All other components within normal limits  RESP PANEL BY RT-PCR (RSV, FLU A&B, COVID)  RVPGX2  CULTURE, BLOOD (ROUTINE X 2)  CULTURE, BLOOD (ROUTINE X 2)  MAGNESIUM  TSH  URINALYSIS, ROUTINE W REFLEX MICROSCOPIC  LACTIC ACID, PLASMA  POC OCCULT BLOOD, ED  TYPE AND SCREEN  PREPARE RBC (CROSSMATCH)    EKG: EKG Interpretation Date/Time:  Friday May 07 2024 19:33:10 EST Ventricular Rate:  105 PR Interval:  164 QRS Duration:  72 QT Interval:  318 QTC Calculation: 421 R Axis:   26  Text Interpretation: Sinus tachycardia Atrial premature complexes LVH with secondary repolarization abnormality Confirmed by Simon Rea [phone]) on 05/07/2024 7:44:44 PM  Radiology: CT Angio Chest PE W and/or Wo Contrast Result Date: 05/07/2024 EXAM: CTA CHEST PE WITH CONTRAST CT ABDOMEN AND PELVIS WITH CONTRAST  05/07/2024 08:44:00 PM TECHNIQUE: CTA of the chest was performed after the administration of 75 mL of iohexol  (OMNIPAQUE ) 350 MG/ML intravenous contrast. Multiplanar reformatted images are provided for review. MIP images are provided for review. CT of the abdomen and pelvis was performed with the administration of intravenous contrast. Automated exposure control, iterative reconstruction, and/or weight based adjustment of the mA/kV was utilized to reduce the radiation dose to as low as reasonably achievable. COMPARISON: AP portable pelvis 12/04/2023, chest AP portable 12/04/2023, chest, abdomen, and pelvis CT with IV contrast 01/18/2020. CLINICAL HISTORY: Workup for PE. SOB. New O2 requirement. History of prior Crohn disease and history of ileitis, drop in hemoglobin, assess for abdominal hemorrhage or inflammation. FINDINGS: CHEST: PULMONARY ARTERIES: Pulmonary arteries are upper normal for caliber. The subsegmental arterial bed is largely obscured due to respiratory motion, but no arterial embolus is seen at least to the segmental level. There is no substantial pericardial effusion. The pulmonary veins are nondilated. MEDIASTINUM: There is a heterogeneous nodule of the inferior pole of the left lobe of the thyroid  gland measuring 1.6 cm, previously 1 cm. Nonemergent follow-up thyroid  ultrasound is recommended if clinically warranted taking into account advanced age and life expectancy. There is mild cardiomegaly, interval moderate increased thickening in the left ventricle wall and septum, query hypertrophic cardiomyopathy. Left ventricular free wall thickness is 1.7 cm. There are patchy 3-vessel coronary calcifications. Tortuosity in the great vessels and aorta with moderate aortic atherosclerosis without aneurysm or dissection. There is a patulous upper thoracic esophagus above the carina level with fluid filling. Consider aspiration precautions unless already being done. . No intrathoracic adenopathy is seen.  LUNGS AND PLEURA: The trachea and central airways are small in caliber which could be due to active respiration or bronchospasm. There is diffuse bronchial thickening right middle and both lower lobes. Symmetric small layering bilateral pleural effusions with adjacent consolidation or atelectasis in the lower lobes. Mild bilateral apical scarring change noted. The lungs are otherwise clear with evidence of mild volume loss of the right hemithorax and multiple chronic healed right rib cage fracture deformities deforming the right chest wall. There is no overt pulmonary edema. No pneumothorax. SOFT TISSUES AND BONES: There are bilateral breast implants with thick capsular calcifications. Axillary spaces are clear. There is mild to moderate thoracic kyphodextroscoliosis with osteopenia and degenerative changes of the spine. There are additional healed fractures of the left anterolateral ribs. Multiple chronic healed right rib cage fracture deformities deforming the right chest wall. ABDOMEN AND PELVIS: LIVER: The liver is unremarkable. GALLBLADDER AND BILE DUCTS: Gallbladder is unremarkable. No biliary ductal dilatation. SPLEEN: Spleen demonstrates no acute abnormality. PANCREAS: Pancreas demonstrates no acute abnormality. ADRENAL GLANDS: Adrenal glands demonstrate no acute abnormality. There is no adrenal mass. KIDNEYS, URETERS AND BLADDER: No renal mass. Scattered left renal scarring. 4 mm calyceal stone mid pole left kidney noted. No other nephrolithiasis. No obstructing stones. Bilateral generalized renal cortical thinning is seen. No hydronephrosis. No perinephric or periureteral stranding. The bladder demonstrates asymmetric thickening and mucosal enhancement to the right warranting urologic referral and consideration of cystoscopy. Cystitis or neoplasm could have this appearance. GI AND BOWEL: The stomach and small bowel are unremarkable. An appendix is not seen. There is a mobile cecum protruding into a new  midline infraumbilical wide mouth hernia, but no evidence of hernia strangulation or bowel obstruction. There is advanced diverticulosis in the distal descending and sigmoid colon. There is faint stranding adjacent to the distal descending sigmoid concerning for mild acute diverticulitis and not seen previously. There is moderate retained stool in the ascending colon. No abnormal bowel wall thickening or distension. REPRODUCTIVE: The uterus and right ovary are unremarkable. There is a stable 2.2 cm simple appearing left ovarian cyst, hounsfield density is 11.3. No follow-up imaging is recommended. There is spray artifact from a vaginal pessary. PERITONEUM AND RETROPERITONEUM: No  free air, free hemorrhage, or abscess. There is minimal perihepatic ascites. LYMPH NODES: No lymphadenopathy. BONES AND SOFT TISSUES: There is artifact from a left hip arthroplasty, which was seen previously. There is osteopenia, moderate to severe broad-based lumbar levoscoliosis, and degenerative change of the lumbar spine. No focal soft tissue abnormality. IMPRESSION: 1. No evidence of pulmonary embolism to the segmental level, acknowledging motion-limited evaluation. 2. Mild acute diverticulitis in the distal descending colon. No abscess or free air. 3. Asymmetric right-sided bladder wall thickening with mucosal enhancement, recommend urologic referral and consideration of cystoscopy given differential of cystitis versus neoplasm. 4. 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 5. Thyroid  nodule in the inferior left lobe measuring 1.6 cm, recommend nonemergent thyroid  ultrasound if clinically warranted given advanced age. 6. Small pleural effusions with adjacent consolidation or atelectasis in the lower lobes . 7. Small caliber central airways consistent with bronchospasm or active respiration. 8. Increasing thickening in the left ventricular wall and  cardiac septum. Query hypertrophic cardiomyopathy. 9. Aortic and coronary artery atherosclerosis. 10. S-shaped thoracolumbar scoliosis, osteopenia, and degenerative changes . SABRA Electronically signed by: Francis Quam MD 05/07/2024 10:10 PM EST RP Workstation: [id number]   CT ABDOMEN PELVIS W CONTRAST Result Date: 05/07/2024  EXAM: CTA CHEST PE WITH CONTRAST CT ABDOMEN AND PELVIS WITH CONTRAST 05/07/2024 08:44:00 PM TECHNIQUE: CTA of the chest was performed after the administration of 75 mL of iohexol  (OMNIPAQUE ) 350 MG/ML intravenous contrast. Multiplanar reformatted images are provided for review. MIP images are provided for review. CT of the abdomen and pelvis was performed with the administration of intravenous contrast. Automated exposure control, iterative reconstruction, and/or weight based adjustment of the mA/kV was utilized to reduce the radiation dose to as low as reasonably achievable. COMPARISON: AP portable pelvis 12/04/2023, chest AP portable 12/04/2023, chest, abdomen, and pelvis CT with IV contrast 01/18/2020. CLINICAL HISTORY: Workup for PE. SOB. New O2 requirement. History of prior Crohn disease and history of ileitis, drop in hemoglobin, assess for abdominal hemorrhage or inflammation. FINDINGS: CHEST: PULMONARY ARTERIES: Pulmonary arteries are upper normal for caliber. The subsegmental arterial bed is largely obscured due to respiratory motion, but no arterial embolus is seen at least to the segmental level. There is no substantial pericardial effusion. The pulmonary veins are nondilated. MEDIASTINUM: There is a heterogeneous nodule of the inferior pole of the left lobe of the thyroid  gland measuring 1.6 cm, previously 1 cm. Nonemergent follow-up thyroid  ultrasound is recommended if clinically warranted taking into account advanced age and life expectancy. There is mild cardiomegaly, interval moderate increased thickening in the left ventricle wall and septum, query hypertrophic  cardiomyopathy. Left ventricular free wall thickness is 1.7 cm. There are patchy 3-vessel coronary calcifications. Tortuosity in the great vessels and aorta with moderate aortic atherosclerosis without aneurysm or dissection. There is a patulous upper thoracic esophagus above the carina level with fluid filling. Consider aspiration precautions unless already being done. . No intrathoracic adenopathy is seen. LUNGS AND PLEURA: The trachea and central airways are small in caliber which could be due to active respiration or bronchospasm. There is diffuse bronchial thickening right middle and both lower lobes. Symmetric small layering bilateral pleural effusions with adjacent consolidation or atelectasis in the lower lobes. Mild bilateral apical scarring change noted. The lungs are otherwise clear with evidence of mild volume loss of the right hemithorax and multiple chronic healed right rib cage fracture deformities deforming the right chest wall. There is no overt pulmonary edema. No pneumothorax. SOFT TISSUES AND BONES: There are bilateral breast implants with thick capsular calcifications. Axillary spaces are clear. There is mild to moderate thoracic kyphodextroscoliosis with osteopenia and degenerative changes of the spine. There are additional healed fractures of the left anterolateral ribs. Multiple chronic healed right rib cage fracture deformities deforming the right chest wall. ABDOMEN AND PELVIS: LIVER: The liver is unremarkable. GALLBLADDER AND BILE DUCTS: Gallbladder is unremarkable. No biliary ductal dilatation. SPLEEN: Spleen demonstrates no acute abnormality. PANCREAS: Pancreas demonstrates no acute abnormality. ADRENAL GLANDS: Adrenal glands demonstrate no acute abnormality. There is no adrenal mass. KIDNEYS, URETERS AND BLADDER: No renal mass. Scattered left renal scarring. 4 mm calyceal stone mid pole left kidney noted. No other nephrolithiasis. No obstructing stones. Bilateral generalized renal  cortical thinning is seen. No hydronephrosis. No perinephric or periureteral stranding. The bladder demonstrates asymmetric thickening and mucosal enhancement to the right warranting urologic referral and consideration of cystoscopy. Cystitis or neoplasm could have this appearance. GI AND BOWEL: The stomach and small bowel are unremarkable. An appendix is not seen. There is a mobile cecum protruding into a new midline infraumbilical wide mouth hernia, but no evidence of hernia strangulation or bowel obstruction. There is advanced diverticulosis in the distal descending and sigmoid colon. There is faint  stranding adjacent to the distal descending sigmoid concerning for mild acute diverticulitis and not seen previously. There is moderate retained stool in the ascending colon. No abnormal bowel wall thickening or distension. REPRODUCTIVE: The uterus and right ovary are unremarkable. There is a stable 2.2 cm simple appearing left ovarian cyst, hounsfield density is 11.3. No follow-up imaging is recommended. There is spray artifact from a vaginal pessary. PERITONEUM AND RETROPERITONEUM: No  free air, free hemorrhage, or abscess. There is minimal perihepatic ascites. LYMPH NODES: No lymphadenopathy. BONES AND SOFT TISSUES: There is artifact from a left hip arthroplasty, which was seen previously. There is osteopenia, moderate to severe broad-based lumbar levoscoliosis, and degenerative change of the lumbar spine. No focal soft tissue abnormality. IMPRESSION: 1. No evidence of pulmonary embolism to the segmental level, acknowledging motion-limited evaluation. 2. Mild acute diverticulitis in the distal descending colon. No abscess or free air. 3. Asymmetric right-sided bladder wall thickening with mucosal enhancement, recommend urologic referral and consideration of cystoscopy given differential of cystitis versus neoplasm. 4. Mobile cecum protruding into a new midline infraumbilical wide-mouth hernia without evidence of  strangulation or bowel obstruction. 5. Thyroid  nodule in the inferior left lobe measuring 1.6 cm, recommend nonemergent thyroid  ultrasound if clinically warranted given advanced age. 6. Small pleural effusions with adjacent consolidation or atelectasis in the lower lobes . 7. Small caliber central airways consistent with bronchospasm or active respiration. 8. Increasing thickening in the left ventricular wall and cardiac septum. Query hypertrophic cardiomyopathy. 9. Aortic and coronary artery atherosclerosis. 10. S-shaped thoracolumbar scoliosis, osteopenia, and degenerative changes . SABRA Electronically signed by: Francis Quam MD 05/07/2024 10:10 PM EST RP Workstation: [id number]   DG Chest Portable 1 View Result Date: 05/07/2024 CLINICAL DATA:  Sepsis workup. EXAM: PORTABLE CHEST 1 VIEW COMPARISON:  None Available. FINDINGS: The cardiac silhouette is mildly enlarged and unchanged in size. Multiple chronic right-sided rib fractures are seen with chronic right-sided volume loss also noted. There is marked severity calcification of the aortic arch. There is moderate severity left basilar scarring and/or atelectasis. Small bilateral pleural effusions are noted. No pneumothorax is identified. Multilevel degenerative changes are present throughout the thoracic spine. IMPRESSION: 1. Multiple chronic right-sided rib fractures with chronic right-sided volume loss. 2. Moderate severity left basilar scarring and/or atelectasis. 3. Small bilateral pleural effusions. Electronically Signed   By: Suzen Dials M.D.   On: 05/07/2024 20:33     Procedures   Medications Ordered in the ED  0.9 %  sodium chloride  infusion (Manually program via Guardrails IV Fluids) (has no administration in time range)  potassium chloride  10 mEq in 100 mL IVPB (has no administration in time range)  sodium chloride  0.9 % bolus 1,000 mL (0 mLs Intravenous Stopped 05/07/24 2225)  vancomycin (VANCOCIN) IVPB 1000 mg/200 mL premix (0 mg  Intravenous Stopped 05/07/24 2225)  piperacillin-tazobactam (ZOSYN) IVPB 3.375 g (0 g Intravenous Stopped 05/07/24 2101)  potassium chloride  SA (KLOR-CON  M) CR tablet 20 mEq (20 mEq Oral Given 05/07/24 2119)  iohexol  (OMNIPAQUE ) 350 MG/ML injection 75 mL (75 mLs Intravenous Contrast Given 05/07/24 2100)  ipratropium-albuterol (DUONEB) 0.5-2.5 (3) MG/3ML nebulizer solution 3 mL (3 mLs Nebulization Given 05/07/24 2230)                                    Medical Decision Making Amount and/or Complexity of Data Reviewed Labs: ordered. Radiology: ordered.  Risk Prescription drug management.     HPI:  Patient presents because of increased altered mental status.  History dementia.  EMS was called to nursing home in the setting of increasing altered mental status as well as concern for fever.  Per EMS, fever was noted to be 800.6 F at the nursing home.  Subsequently brought area and to ED.  Upon EMS arrival, she was tachypneic.  O2 saturation in low 90s.  Started on CPAP because of tachypnea and work of breathing.   Patient is able to tell me her name and location but not month.  She denies chest pain but does endorse shortness of breath.  Denies any abdominal pain.  She is not aware of any kind of nausea vomiting or diarrhea.  She is not aware of any kind of fevers.  She denies any kind of recent falls.  Previous medical history reviewed : Last seen in the ED in June 2025 because of fall.  Negative workup at that time.  Last admitted in 2021 in setting of fall.  Scapular fracture.  Right rib fractures.  Dementia.  Chronic diastolic CHF.  MDM:   On exam, patient on CPAP.  Patient able to tell me her name and location but not current date.  No focal deficits I can appreciate.  Moving all extremities.  Smiling.  No concerns for CVA at this point time.  Transition patient to 3 L nasal cannula from CPAP.  Will see how she does.  Lung sounds were largely unremarkable.  No obvious rales or  rhonchi I can appreciate.  No significant wheezing.  He had a soft and benign abdomen.  No rebound guarding or tenderness.  Will obtain vital signs to check for any kind of fever.  Will perform sepsis workup given reported fever at the nursing home and increasing altered mental status.  Patient has some superficial wounds including to the left forearm that looks like a skin tear but otherwise no obvious cellulitis at this point time.  Will need to turn the patient.  Will obtain UA to check for UTI.  Chest x-ray to assess for any kind of pneumonia.  Obtain COVID RSV and flu to see if this explains patient's slight cough that she had when she was in the room as well as fever.  Reevaluation:   Upon reexamination, patient remains on 3 L  Ended up obtaining CTA chest as well as CT abdomen pelvis.  CTA chest rule out PE given tachycardia and tachypnea.  Will rule out evidence pneumonia.  CT abdomen pelvis given history of Crohn's.  One of the assessment can of acute complications in the setting of Crohn's.  CTA of the chest showed no PE.  No infiltrate concerning for pneumonia at this point time.  CT scan of the abdomen did show evidence of diverticulitis.  He does have a ventral hernia but currently no concerns for kind of ischemia to this bowel.  Soft abdomen over this area.  Reported some stool.  I think fever is likely in the setting of diverticulitis.  Did speak to [facility].  They will see the patient in morning.  Discussed patient's recent GI follow-up.  She did have a positive stool guaiac here but her stool was brown.  No melena.  No bright red blood per rectum.  Will give 1 unit packed red blood cells given hemoglobin drop here recently but otherwise no concerns for large-volume GI bleed at this point time.  Will not start patient on PPI IV given no melena on exam.  Not  give full 30 cc per cake bolus of fluid given patient's volume status.  Will continue to monitor and give fluid as  needed.  Hypokalemic.  Potassium level of 3.  Was given 20 mill equivalents of p.o. potassium.  1 round of IV 10 mg potassium as well as  Patient will be admitted for further care.   Interventions: 1 u prbc, 1000 cc NS, zosyn, vancomycin   EKG Interpreted by Me: sinus    Cardiac Tele Interpreted by Me: sinus    I have independently interpreted the CXR and CT  images and agree with the radiologist finding   Disposition and Follow Up: admit    CRITICAL CARE Performed by: Lavonia LOISE Pat   Total critical care time: 45 minutes  Critical care time was exclusive of separately billable procedures and treating other patients.  Critical care was necessary to treat or prevent imminent or life-threatening deterioration.  Critical care was time spent personally by me on the following activities: development of treatment plan with patient and/or surrogate as well as nursing, discussions with consultants, evaluation of patient's response to treatment, examination of patient, obtaining history from patient or surrogate, ordering and performing treatments and interventions, ordering and review of laboratory studies, ordering and review of radiographic studies, pulse oximetry and re-evaluation of patient's condition.      Final diagnoses:  None    ED Discharge Orders     None          Pat Lavonia LOISE, MD 05/07/24 2245

## 2024-05-07 NOTE — ED Triage Notes (Signed)
 Pt. BIB [facility] from [facility] with c/o respiratory distress; Per [facility], [facility] stated that she was having difficulty breathing and wounds all over her body, and they noticed some pedal edema today. Hx of dementia. Per [facility] they administered some albuterol and nitroglycerin. Pt. Has a 22G in R hand.   VS per [facility]:  BP: 130/50 HR: 112 A-fib CBG: 226

## 2024-05-07 NOTE — ED Notes (Signed)
 IV team at bedside

## 2024-05-08 DIAGNOSIS — Z8719 Personal history of other diseases of the digestive system: Secondary | ICD-10-CM

## 2024-05-08 DIAGNOSIS — R935 Abnormal findings on diagnostic imaging of other abdominal regions, including retroperitoneum: Secondary | ICD-10-CM | POA: Diagnosis not present

## 2024-05-08 DIAGNOSIS — K922 Gastrointestinal hemorrhage, unspecified: Secondary | ICD-10-CM | POA: Diagnosis not present

## 2024-05-08 DIAGNOSIS — K5792 Diverticulitis of intestine, part unspecified, without perforation or abscess without bleeding: Secondary | ICD-10-CM | POA: Diagnosis not present

## 2024-05-08 DIAGNOSIS — D500 Iron deficiency anemia secondary to blood loss (chronic): Secondary | ICD-10-CM | POA: Diagnosis not present

## 2024-05-08 DIAGNOSIS — R195 Other fecal abnormalities: Secondary | ICD-10-CM

## 2024-05-08 LAB — URINALYSIS, ROUTINE W REFLEX MICROSCOPIC
Bilirubin Urine: NEGATIVE
Glucose, UA: NEGATIVE mg/dL
Ketones, ur: NEGATIVE mg/dL
Nitrite: NEGATIVE
Protein, ur: 30 mg/dL — AB
Specific Gravity, Urine: 1.02 (ref 1.005–1.030)
pH: 6 (ref 5.0–8.0)

## 2024-05-08 LAB — URINALYSIS, MICROSCOPIC (REFLEX)
Bacteria, UA: NONE SEEN
WBC, UA: >50 WBC/hpf (ref 0–5)

## 2024-05-08 LAB — TYPE AND SCREEN
ABO/RH(D): O POS
Antibody Screen: NEGATIVE
Unit division: 0

## 2024-05-08 LAB — CBC
HCT: 25.6 % — ABNORMAL LOW (ref 36.0–46.0)
HCT: 28.3 % — ABNORMAL LOW (ref 36.0–46.0)
Hemoglobin: 7.7 g/dL — ABNORMAL LOW (ref 12.0–15.0)
Hemoglobin: 8.6 g/dL — ABNORMAL LOW (ref 12.0–15.0)
MCH: 24.6 pg — ABNORMAL LOW (ref 26.0–34.0)
MCH: 24.8 pg — ABNORMAL LOW (ref 26.0–34.0)
MCHC: 30.1 g/dL (ref 30.0–36.0)
MCHC: 30.4 g/dL (ref 30.0–36.0)
MCV: 81.8 fL (ref 80.0–100.0)
MCV: 81.6 fL (ref 80.0–100.0)
Platelets: 252 K/uL (ref 150–400)
Platelets: 271 K/uL (ref 150–400)
RBC: 3.13 MIL/uL — ABNORMAL LOW (ref 3.87–5.11)
RBC: 3.47 MIL/uL — ABNORMAL LOW (ref 3.87–5.11)
RDW: 15.4 % (ref 11.5–15.5)
RDW: 15.7 % — ABNORMAL HIGH (ref 11.5–15.5)
WBC: 9.4 K/uL (ref 4.0–10.5)
WBC: 10 K/uL (ref 4.0–10.5)
nRBC: 0 % (ref 0.0–0.2)
nRBC: 0 % (ref 0.0–0.2)

## 2024-05-08 LAB — COMPREHENSIVE METABOLIC PANEL WITH GFR
ALT: 13 U/L (ref 0–44)
AST: 16 U/L (ref 15–41)
Albumin: 2.2 g/dL — ABNORMAL LOW (ref 3.5–5.0)
Alkaline Phosphatase: 53 U/L (ref 38–126)
Anion gap: 5 (ref 5–15)
BUN: 28 mg/dL — ABNORMAL HIGH (ref 8–23)
CO2: 26 mmol/L (ref 22–32)
Calcium: 8.1 mg/dL — ABNORMAL LOW (ref 8.9–10.3)
Chloride: 114 mmol/L — ABNORMAL HIGH (ref 98–111)
Creatinine, Ser: 0.84 mg/dL (ref 0.44–1.00)
GFR, Estimated: >60 mL/min (ref >60)
Glucose, Bld: 93 mg/dL (ref 70–99)
Potassium: 3.7 mmol/L (ref 3.5–5.1)
Sodium: 145 mmol/L (ref 135–145)
Total Bilirubin: 0.6 mg/dL (ref 0.0–1.2)
Total Protein: 5 g/dL — ABNORMAL LOW (ref 6.5–8.1)

## 2024-05-08 LAB — BPAM RBC
Blood Product Expiration Date: 202512232359
ISSUE DATE / TIME: 202511282211
Unit Type and Rh: 5100

## 2024-05-08 LAB — PREPARE RBC (CROSSMATCH)

## 2024-05-08 LAB — PHOSPHORUS: Phosphorus: 2.6 mg/dL (ref 2.5–4.6)

## 2024-05-08 LAB — MAGNESIUM: Magnesium: 2.1 mg/dL (ref 1.7–2.4)

## 2024-05-08 LAB — LACTIC ACID, PLASMA: Lactic Acid, Venous: 1.8 mmol/L (ref 0.5–1.9)

## 2024-05-08 MED ORDER — SUCRALFATE 1 G PO TABS
1.0000 g | ORAL_TABLET | Freq: Three times a day (TID) | ORAL | Status: DC
  Administered 2024-05-08 – 2024-05-17 (×29): 1 g via ORAL
  Filled 2024-05-08 (×31): qty 1

## 2024-05-08 MED ORDER — BUDESONIDE 3 MG PO CPEP
9.0000 mg | ORAL_CAPSULE | Freq: Every day | ORAL | Status: DC
  Administered 2024-05-08 – 2024-05-17 (×9): 9 mg via ORAL
  Filled 2024-05-08 (×10): qty 3

## 2024-05-08 MED ORDER — VITAMIN B-12 1000 MCG PO TABS
1000.0000 ug | ORAL_TABLET | Freq: Every day | ORAL | Status: DC
  Administered 2024-05-08: 1000 ug via ORAL
  Filled 2024-05-08: qty 1

## 2024-05-08 MED ORDER — COLLAGENASE 250 UNIT/GM EX OINT
TOPICAL_OINTMENT | Freq: Every day | CUTANEOUS | Status: DC
  Administered 2024-05-15: 1 via TOPICAL
  Filled 2024-05-08 (×2): qty 30

## 2024-05-08 MED ORDER — CYANOCOBALAMIN 1000 MCG/ML IJ SOLN
1000.0000 ug | Freq: Every day | INTRAMUSCULAR | Status: AC
  Administered 2024-05-08 – 2024-05-14 (×7): 1000 ug via SUBCUTANEOUS
  Filled 2024-05-08 (×7): qty 1

## 2024-05-08 MED ORDER — CYANOCOBALAMIN 1000 MCG/ML IJ SOLN
1000.0000 ug | INTRAMUSCULAR | Status: DC
  Administered 2024-05-15: 1000 ug via SUBCUTANEOUS
  Filled 2024-05-08: qty 1

## 2024-05-08 MED ORDER — VANCOMYCIN HCL 750 MG/150ML IV SOLN: 750.0000 mg | INTRAVENOUS | Status: DC

## 2024-05-08 MED ORDER — MIRTAZAPINE 15 MG PO TABS
7.5000 mg | ORAL_TABLET | Freq: Every day | ORAL | Status: DC
  Administered 2024-05-08 – 2024-05-16 (×7): 7.5 mg via ORAL
  Filled 2024-05-08 (×8): qty 1

## 2024-05-08 MED ORDER — DOCUSATE SODIUM 100 MG PO CAPS
100.0000 mg | ORAL_CAPSULE | Freq: Two times a day (BID) | ORAL | Status: DC
  Administered 2024-05-08 – 2024-05-16 (×11): 100 mg via ORAL
  Filled 2024-05-08 (×14): qty 1

## 2024-05-08 MED ORDER — FERROUS SULFATE 325 (65 FE) MG PO TABS
325.0000 mg | ORAL_TABLET | Freq: Every day | ORAL | Status: DC
  Administered 2024-05-08 – 2024-05-17 (×9): 325 mg via ORAL
  Filled 2024-05-08 (×9): qty 1

## 2024-05-08 MED ORDER — ENSURE PLUS HIGH PROTEIN PO LIQD
Freq: Three times a day (TID) | ORAL | Status: DC
  Administered 2024-05-08 – 2024-05-09 (×2): 237 mL via ORAL

## 2024-05-08 MED ORDER — PIPERACILLIN-TAZOBACTAM 3.375 G IVPB
3.3750 g | Freq: Three times a day (TID) | INTRAVENOUS | Status: AC
  Administered 2024-05-08 – 2024-05-14 (×19): 3.375 g via INTRAVENOUS
  Filled 2024-05-08 (×20): qty 50

## 2024-05-08 MED ORDER — DONEPEZIL HCL 10 MG PO TABS
10.0000 mg | ORAL_TABLET | Freq: Every day | ORAL | Status: DC
  Administered 2024-05-08 – 2024-05-16 (×7): 10 mg via ORAL
  Filled 2024-05-08 (×8): qty 1

## 2024-05-08 MED ORDER — PANTOPRAZOLE SODIUM 40 MG PO TBEC
40.0000 mg | DELAYED_RELEASE_TABLET | Freq: Every day | ORAL | Status: DC
  Administered 2024-05-08 – 2024-05-17 (×9): 40 mg via ORAL
  Filled 2024-05-08 (×9): qty 1

## 2024-05-08 NOTE — Evaluation (Signed)
 Occupational Therapy Evaluation and Discharge Patient Details Name: [name] MRN: [id number] DOB: 09/24/1932 Today's Date: 05/08/2024   History of Present Illness   89 yo female presenting with confusion and fever. Pt requires 1 unit of blood on arrival and noted to have bloody stool. Pt being treated for sepsis, acute RF and encphalopathy. PMH CHF crohns disease, diverticulosis, uterovacinal prolapse with pessary, HTN, HLD, Osteoporosis, CKD3, dementia     Clinical Impressions Pt poor historian; however, per chart review, pt is a resident of [facility] with a hx of dementia, pt reports she spends most of the time in the bed, and presents with B LE contractures. Given this information, OT suspects pt is at or very near baseline PLOF. Pt demonstrating ability to participate in UB ADLs with step-by-step 1-step commands and Min to Max assist. Pt requiring Total assist +1 to +2 for LB ADLs and for rolling L/R in the bed. As pt is presenting at or near baseline PLOF, no further benefit from acute OT services at this time. Post acute discharge, recommend return to [facility] with rehab services at the discretion of the facility. Acute OT signing off at this time.      If plan is discharge home, recommend the following:   Two people to help with walking and/or transfers;Two people to help with bathing/dressing/bathroom;Assistance with cooking/housework;Assistance with feeding;Direct supervision/assist for medications management;Direct supervision/assist for financial management;Assist for transportation;Help with stairs or ramp for entrance;Supervision due to cognitive status     Functional Status Assessment   Patient has not had a recent decline in their functional status     Equipment Recommendations   Other (comment) (defer to next level of care)     Recommendations for Other Services         Precautions/Restrictions   Precautions Precautions: Fall Recall of  Precautions/Restrictions: Impaired Restrictions Weight Bearing Restrictions Per Provider Order: No     Mobility Bed Mobility Overal bed mobility: Needs Assistance   Rolling: +2 for physical assistance, Total assist         General bed mobility comments: Pt able to follow 1-step cue to grasp bed rail during rolling R/L but unable to provide meaningful assistance    Transfers                   General transfer comment: Deferred      Balance                                           ADL either performed or assessed with clinical judgement   ADL Overall ADL's : Needs assistance/impaired Eating/Feeding: NPO   Grooming: Minimal assistance;Moderate assistance;Bed level;Cueing for sequencing (cues for initation; step-by-step cues for sequencing and to maintain attention to tasks)   Upper Body Bathing: Maximal assistance;Bed level (cues for initation; step-by-step cues for sequencing and to maintain attention to tasks)   Lower Body Bathing: Total assistance;+2 for safety/equipment;+2 for physical assistance;Bed level   Upper Body Dressing : Moderate assistance;Maximal assistance;Bed level;Cueing for sequencing (cues for initation; step-by-step cues for sequencing and to maintain attention to tasks)   Lower Body Dressing: Total assistance;Bed level;+2 for safety/equipment;+2 for physical assistance     Toilet Transfer Details (indicate cue type and reason): unable at this time Toileting- Clothing Manipulation and Hygiene: Total assistance;+2 for physical assistance;+2 for safety/equipment;Bed level  Vision Patient Visual Report: Other (comment) (Pt unable to report) Additional Comments: [facility] for tasks assessed; not formally screened or evaluated     Perception         Praxis         Pertinent Vitals/Pain Pain Assessment Pain Assessment: No/denies pain     Extremity/Trunk Assessment Upper Extremity Assessment Upper  Extremity Assessment: Right hand dominant;Generalized weakness;RUE deficits/detail;LUE deficits/detail RUE Deficits / Details: generalized weakness; AROM shoulder flexion to approximately 70 degrees with all other ROM WFL; impaired coordination; large skin tear noted on anterior proximal portion of forearm with RN notified and providing wound care during session; pt also noted with bruising throughout UE RUE Sensation: WNL RUE Coordination: decreased gross motor;decreased fine motor LUE Deficits / Details: generalized weakness; AROM WFL but with stiffness noted in shoulder especially with horizontal abduction; impaired coordination; skin tear noted on anterior distal portion of upper arm just above elbow with RN notified and providing wound care during session; pt also noted with bruising throughout UE LUE Sensation: WNL LUE Coordination: decreased fine motor;decreased gross motor   Lower Extremity Assessment Lower Extremity Assessment: Defer to PT evaluation   Cervical / Trunk Assessment Cervical / Trunk Assessment: Kyphotic   Communication Communication Communication: No apparent difficulties   Cognition Arousal: Alert Behavior During Therapy: Anxious, Flat affect (Largely flat affect but with occasional signs of anxiety when RN placing straight cath and with peri care. Pt easily reassured, redirected, and calmed.) Cognition: History of cognitive impairments, No family/caregiver present to determine baseline             OT - Cognition Comments: Pt with dementia diagnosis at baseline. Pt oriented to self and knows she is at a hospital. Not oriented to time or situation. Able to follow 1-step commands and requiring step-by-step cues for initiation and sustained participation in tasks. Suspect pt is at or near baseline.                 Following commands: Impaired Following commands impaired: Only follows one step commands consistently, Follows one step commands with increased  time     Cueing  General Comments   Cueing Techniques: Verbal cues;Gestural cues;Tactile cues;Visual cues  VSS on RA. RN present to straight cath patient. Multiple skin tears and bruising noted with RN assessing and providing wound care during session.   Exercises     Shoulder Instructions      Home Living Family/patient expects to be discharged to:: Skilled nursing facility                                 Additional Comments: Pt is a resident of [facility].      Prior Functioning/Environment Prior Level of Function : Patient poor historian/Family not available             Mobility Comments: Pt unable to provide much history due to cognition; however, pt repotrs she spends most of her time in bed. Pt with bilateral hamstring contractures indicating that she was likely not ambulatory. ADLs Comments: Pt able to follow 1-step commands to assist with tasks with step-by-step cues, but requires constant cues for initiation and sustained participation to perform UB ADLs with Supervision-Set up to Max assit and LB ADLs with Total assist of +1 to +2. Suspect this is baseline based on chart review and extent of pt's LE contactures.    OT Problem List:  OT Treatment/Interventions:        OT Goals(Current goals can be found in the care plan section)   Acute Rehab OT Goals OT Goal Formulation: All assessment and education complete, DC therapy   OT Frequency:       Co-evaluation PT/OT/SLP Co-Evaluation/Treatment: Yes Reason for Co-Treatment: To address functional/ADL transfers PT goals addressed during session: Mobility/safety with mobility OT goals addressed during session: ADL's and self-care      AM-PAC OT 6 Clicks Daily Activity     Outcome Measure Help from another person eating meals?: Total (NPO) Help from another person taking care of personal grooming?: A Lot Help from another person toileting, which includes using toliet, bedpan, or  urinal?: Total Help from another person bathing (including washing, rinsing, drying)?: A Lot Help from another person to put on and taking off regular upper body clothing?: A Lot Help from another person to put on and taking off regular lower body clothing?: Total 6 Click Score: 9   End of Session Nurse Communication: Mobility status;Other (comment);Precautions (Pt with notes skin tears and bruising with RN assessand caring for during session.)  Activity Tolerance: Patient tolerated treatment well;Other (comment) (Pt limited by cognitive levele) Patient left: in bed;with call bell/phone within reach;with nursing/sitter in room  OT Visit Diagnosis: Other abnormalities of gait and mobility (R26.89);Other symptoms and signs involving cognitive function                Time: 8981-8940 OT Time Calculation (min): 41 min Charges:  OT Evaluation $OT Eval Moderate Complexity: 1 Mod  Margarie Rockey HERO., OTR/L, MA Acute Rehab [phone]   Margarie FORBES Horns 05/08/2024, 11:58 AM

## 2024-05-08 NOTE — Evaluation (Signed)
 Physical Therapy Brief Evaluation and Discharge Note Patient Details Name: [name] MRN: [id number] DOB: 1932-09-26 Today's Date: 05/08/2024   History of Present Illness  89 yo female presenting with confusion and fever. Pt requires 1 unit of blood on arrival and noted to have bloody stool. Pt being treated for sepsis, acute RF and encphalopathy. PMH CHF crohns disease, diverticulosis, uterovacinal prolapse with pessary, HTN, HLD, Osteoporosis, CKD3, dementia  Clinical Impression  Pt presents with admitting diagnosis above. Co-treat with OT. Pt today required +2 total A to roll to assist nursing with pericare and straight cath. Pt was not a great historian however reports that she mostly stays in the bed. Pt noted with bilateral hamstring contractures indicating that she was likely not ambulatory. Pt presents at or near baseline mobility. Pt has no further acute PT needs and will be signing off. Re consult PT if mobility status changes.        PT Assessment Patient does not need any further PT services  Assistance Needed at Discharge  Frequent or constant Supervision/Assistance    Equipment Recommendations Hoyer lift;Hospital bed  Recommendations for Other Services       Precautions/Restrictions Precautions Precautions: Fall Recall of Precautions/Restrictions: Impaired Restrictions Weight Bearing Restrictions Per Provider Order: No        Mobility  Bed Mobility Rolling: +2 for physical assistance, Total assist     General bed mobility comments: +2 total A to roll for pericare.  Transfers                   General transfer comment: Deferred    Ambulation/Gait              Home Activity Instructions    Stairs            Modified Rankin (Stroke Patients Only)        Balance                          Pertinent Vitals/Pain PT - Brief Vital Signs All Vital Signs Stable: Yes Pain Assessment Pain Assessment: No/denies pain      Home Living Family/patient expects to be discharged to:: Skilled nursing facility                  Prior Function Level of Independence: Needs assistance Comments: Appears to be bedbound at baseline    UE/LE Assessment   UE ROM/Strength/Tone/Coordination: Generalized weakness    LE ROM/Strength/Tone/Coordination: Impaired LE ROM/Strength/Tone/Coordination Deficits: Bilateral hamstring contractures.    Communication   Communication Communication: No apparent difficulties     Cognition Overall Cognitive Status: History of cognitive impairments - at baseline       General Comments General comments (skin integrity, edema, etc.): VSS. RN present to straight cath patient. Multiple skin tears noted.    Exercises     Assessment/Plan    PT Problem List         PT Visit Diagnosis Other abnormalities of gait and mobility (R26.89)    No Skilled PT Patient at baseline level of functioning   Co-evaluation                AMPAC 6 Clicks Help needed turning from your back to your side while in a flat bed without using bedrails?: Total Help needed moving from lying on your back to sitting on the side of a flat bed without using bedrails?: Total Help needed moving to and from a bed to  a chair (including a wheelchair)?: Total Help needed standing up from a chair using your arms (e.g., wheelchair or bedside chair)?: Total Help needed to walk in hospital room?: Total Help needed climbing 3-5 steps with a railing? : Total 6 Click Score: 6      End of Session   Activity Tolerance: Patient tolerated treatment well Patient left: in bed;with call bell/phone within reach;with bed alarm set;with nursing/sitter in room Nurse Communication: Mobility status PT Visit Diagnosis: Other abnormalities of gait and mobility (R26.89)     Time: 8981-8940 PT Time Calculation (min) (ACUTE ONLY): 41 min  Charges:   PT Evaluation $PT Eval Moderate Complexity: 1 Mod PT  Treatments $Therapeutic Activity: 8-22 mins    [name], PT, DPT Acute Rehab Services [phone]   [name]  05/08/2024, 11:00 AM

## 2024-05-08 NOTE — Progress Notes (Addendum)
 PROGRESS NOTE    [name]  FMW:[id number] DOB: 09-04-1932 DOA: 05/07/2024 PCP: System, Provider Not In   Chief Complaint  Patient presents with   Respiratory Distress    Brief Narrative:    [name] is a 89 y.o. female with medical history significant for CHF, Crohn's disease, diverticulosis, uterovaginal prolapse with pessary, hemorrhoids, hyperparathyroidism s/p prior parathyroidectomy, HTN, HLD, osteoporosis, vitamin D  deficiency, CKD 3, protein calorie malnutrition.  Patient brought into ED by EMS from nursing home for increasing confusion and fever.   ED Course: Upon patient arrival, she was tachypneic with SpO2 in low 90s.  Was started on CPAP, but then weaned quickly to 3 L O2 via .  Transfused 1 unit of blood given hemoglobin of 7 and positive occult blood in stool sample.  For hypokalemia, given 20 mEq p.o. potassium; additional 10 mEq IV was ordered but not given.  Assessment & Plan:   Principal Problem:   GIB (gastrointestinal bleeding)  Sepsis, POA -Blood cultures, UA is pending. - CT chest/abdomen/pelvis significant for diverticulitis. - Continue with IV Zosyn , will narrow and DC IV vancomycin .  Acute diverticulitis -Will start on full liquid diet and advance as tolerated -Continue with IV antibiotics  Right red blood per rectum Occult positive stool Iron deficiency anemia -Received 1 unit PRBC overnight -Continue with PPI and Carafate  -Management per GI - Recent anemia workup significant for low ferritin and iron level, continue with p.o. iron, hold on IV iron due to sepsis on presentation - B12 borderline, will start on supplements will continue with IM given known history of chron's disease and poor GI absorption  Acute metabolic encephalopathy Underlying significant dementia - Worsening mental status in the setting of sepsis, dehydration - With underlying advanced dementia, high risk for delirium - Start on B12 supplements  History of  chron's disease -On chronic budesonide  -Continue with budesonide  per GI  Severe protein calorie malnutrition -RD consult pending  Abnormal findings in urinary bladder on imaging (cystitis versus neoplasm) -Will obtain UA. - Outpatient referral to urology  Right forearm skin tear -Wound care consulted  Chronic diastolic CHF - Presents with volume overload  Hypernatremia -Due to volume depletion/dehydration, resolved with hydration  Hypokalemia - Resolved with hydration  Dementia - Continue with supportive care - Continue with home Aricept   Addendum 1:20 PM - Urine analysis is positive significant for UTI, discussed with micro lab to add urine cultures, I have discussed with son and updated at bedside   DVT prophylaxis: ( [place] Heparin) Code Status: (Fulls) Family Communication: (none at bedside, left Sonnie voice message) Disposition:   Status is: Sepsis, on IV antibiotics, still undergoing workup per GI   Consultants:  Gastroenterology     Subjective:  Received 1 unit PRBC overnight, otherwise unable to provide any reliable complaints, no significant events overnight.  Objective: Vitals:   05/08/24 0300 05/08/24 0400 05/08/24 0500 05/08/24 0812  BP:  109/64  129/68  Pulse:    70  Resp: 18 18 18    Temp:  98 F (36.7 C)  98.3 F (36.8 C)  TempSrc:  Oral  Oral  SpO2:  98%    Weight:      Height:        Intake/Output Summary (Last 24 hours) at 05/08/2024 1045 Last data filed at 05/08/2024 0600 Gross per 24 hour  Intake 1540.28 ml  Output 200 ml  Net 1340.28 ml   Filed Weights   05/08/24 0019  Weight: 49 kg  Examination:  Awake Alert, generally demented, in no apparent distress CTAB RRR +ve B.Sounds, Abd Soft No Cyanosis, Clubbing or edema     Data Reviewed: I have personally reviewed following labs and imaging studies  CBC: Recent Labs  Lab 05/03/24 1142 05/07/24 1920 05/07/24 1931 05/08/24 0611  WBC 5.9 11.3*  --  9.4   NEUTROABS 4.4 9.7*  --   --   HGB 7.8 Repeated and verified X2.* 7.0* 7.8*  7.8* 7.7*  HCT 24.6 Repeated and verified X2.* 24.8* 23.0*  23.0* 25.6*  MCV 77.6* 84.4  --  81.8  PLT 300.0 320  --  252    Basic Metabolic Panel: Recent Labs  Lab 05/03/24 1142 05/07/24 1920 05/07/24 1931 05/08/24 0611  NA 140 143 147*  147* 145  K 3.9 3.0* 3.0*  3.0* 3.7  CL 108 113* 112* 114*  CO2 27 20*  --  26  GLUCOSE 93 178* 171* 93  BUN 19 34* 32* 28*  CREATININE 0.65 1.02* 1.10* 0.84  CALCIUM  8.9 8.0*  --  8.1*  MG  --  2.1  --  2.1  PHOS  --   --   --  2.6    GFR: Estimated Creatinine Clearance: 35.8 mL/min (by C-G formula based on SCr of 0.84 mg/dL).  Liver Function Tests: Recent Labs  Lab 05/03/24 1142 05/07/24 1920 05/08/24 0611  AST 12 19 16   ALT 10 13 13   ALKPHOS 80 71 53  BILITOT 0.6 0.4 0.6  PROT 6.3 5.5* 5.0*  ALBUMIN 3.3* 2.4* 2.2*    CBG: Recent Labs  Lab 05/07/24 1937  GLUCAP 178*     Recent Results (from the past 240 hours)  Resp panel by RT-PCR (RSV, Flu A&B, Covid) Anterior Nasal Swab     Status: None   Collection Time: 05/07/24  7:06 PM   Specimen: Anterior Nasal Swab  Result Value Ref Range Status   SARS Coronavirus 2 by RT PCR NEGATIVE NEGATIVE Final   Influenza A by PCR NEGATIVE NEGATIVE Final   Influenza B by PCR NEGATIVE NEGATIVE Final    Comment: (NOTE) The Xpert Xpress SARS-CoV-2/FLU/RSV plus assay is intended as an aid in the diagnosis of influenza from Nasopharyngeal swab specimens and should not be used as a sole basis for treatment. Nasal washings and aspirates are unacceptable for Xpert Xpress SARS-CoV-2/FLU/RSV testing.  Fact Sheet for Patients: bloggercourse.com  Fact Sheet for Healthcare Providers: seriousbroker.it  This test is not yet approved or cleared by the [place]  FDA and has been authorized for detection and/or diagnosis of SARS-CoV-2 by FDA under an Emergency  Use Authorization (EUA). This EUA will remain in effect (meaning this test can be used) for the duration of the COVID-19 declaration under Section 564(b)(1) of the Act, 21 U.S.C. section 360bbb-3(b)(1), unless the authorization is terminated or revoked.     Resp Syncytial Virus by PCR NEGATIVE NEGATIVE Final    Comment: (NOTE) Fact Sheet for Patients: bloggercourse.com  Fact Sheet for Healthcare Providers: seriousbroker.it  This test is not yet approved or cleared by the [place]  FDA and has been authorized for detection and/or diagnosis of SARS-CoV-2 by FDA under an Emergency Use Authorization (EUA). This EUA will remain in effect (meaning this test can be used) for the duration of the COVID-19 declaration under Section 564(b)(1) of the Act, 21 U.S.C. section 360bbb-3(b)(1), unless the authorization is terminated or revoked.  Performed at [facility], [address] [place], [place] [address]  Blood culture (routine x 2)     Status: None (Preliminary result)   Collection Time: 05/07/24  7:20 PM   Specimen: BLOOD LEFT FOREARM  Result Value Ref Range Status   Specimen Description BLOOD LEFT FOREARM  Final   Special Requests   Final    BOTTLES DRAWN AEROBIC AND ANAEROBIC Blood Culture adequate volume   Culture   Final    NO GROWTH < 24 HOURS Performed at [facility], [address] [place], [place] [address]    Report Status PENDING  Incomplete  Blood culture (routine x 2)     Status: None (Preliminary result)   Collection Time: 05/08/24  6:11 AM   Specimen: BLOOD RIGHT ARM  Result Value Ref Range Status   Specimen Description BLOOD RIGHT ARM  Final   Special Requests   Final    BOTTLES DRAWN AEROBIC AND ANAEROBIC Blood Culture adequate volume   Culture   Final    NO GROWTH < 12 HOURS Performed at [facility], [address] [place], [place] [address]    Report Status PENDING  Incomplete          Radiology Studies: CT Angio Chest PE W and/or Wo Contrast Result Date: 05/07/2024 EXAM: CTA CHEST PE WITH CONTRAST CT ABDOMEN AND PELVIS WITH CONTRAST 05/07/2024 08:44:00 PM TECHNIQUE: CTA of the chest was performed after the administration of 75 mL of iohexol  (OMNIPAQUE ) 350 MG/ML intravenous contrast. Multiplanar reformatted images are provided for review. MIP images are provided for review. CT of the abdomen and pelvis was performed with the administration of intravenous contrast. Automated exposure control, iterative reconstruction, and/or weight based adjustment of the mA/kV was utilized to reduce the radiation dose to as low as reasonably achievable. COMPARISON: AP portable pelvis 12/04/2023, chest AP portable 12/04/2023, chest, abdomen, and pelvis CT with IV contrast 01/18/2020. CLINICAL HISTORY: Workup for PE. SOB. New O2 requirement. History of prior Crohn disease and history of ileitis, drop in hemoglobin, assess for abdominal hemorrhage or inflammation. FINDINGS: CHEST: PULMONARY ARTERIES: Pulmonary arteries are upper normal for caliber. The subsegmental arterial bed is largely obscured due to respiratory motion, but no arterial embolus is seen at least to the segmental level. There is no substantial pericardial effusion. The pulmonary veins are nondilated. MEDIASTINUM: There is a heterogeneous nodule of the inferior pole of the left lobe of the thyroid  gland measuring 1.6 cm, previously 1 cm. Nonemergent follow-up thyroid  ultrasound is recommended if clinically warranted taking into account advanced age and life expectancy. There is mild cardiomegaly, interval moderate increased thickening in the left ventricle wall and septum, query hypertrophic cardiomyopathy. Left ventricular free wall thickness is 1.7 cm. There are patchy 3-vessel coronary calcifications. Tortuosity in the great vessels and aorta with moderate aortic atherosclerosis without aneurysm or dissection. There is a patulous  upper thoracic esophagus above the carina level with fluid filling. Consider aspiration precautions unless already being done. . No intrathoracic adenopathy is seen. LUNGS AND PLEURA: The trachea and central airways are small in caliber which could be due to active respiration or bronchospasm. There is diffuse bronchial thickening right middle and both lower lobes. Symmetric small layering bilateral pleural effusions with adjacent consolidation or atelectasis in the lower lobes. Mild bilateral apical scarring change noted. The lungs are otherwise clear with evidence of mild volume loss of the right hemithorax and multiple chronic healed right rib cage fracture deformities deforming the right chest wall. There is no overt pulmonary edema. No pneumothorax. SOFT TISSUES  AND BONES: There are bilateral breast implants with thick capsular calcifications. Axillary spaces are clear. There is mild to moderate thoracic kyphodextroscoliosis with osteopenia and degenerative changes of the spine. There are additional healed fractures of the left anterolateral ribs. Multiple chronic healed right rib cage fracture deformities deforming the right chest wall. ABDOMEN AND PELVIS: LIVER: The liver is unremarkable. GALLBLADDER AND BILE DUCTS: Gallbladder is unremarkable. No biliary ductal dilatation. SPLEEN: Spleen demonstrates no acute abnormality. PANCREAS: Pancreas demonstrates no acute abnormality. ADRENAL GLANDS: Adrenal glands demonstrate no acute abnormality. There is no adrenal mass. KIDNEYS, URETERS AND BLADDER: No renal mass. Scattered left renal scarring. 4 mm calyceal stone mid pole left kidney noted. No other nephrolithiasis. No obstructing stones. Bilateral generalized renal cortical thinning is seen. No hydronephrosis. No perinephric or periureteral stranding. The bladder demonstrates asymmetric thickening and mucosal enhancement to the right warranting urologic referral and consideration of cystoscopy. Cystitis or  neoplasm could have this appearance. GI AND BOWEL: The stomach and small bowel are unremarkable. An appendix is not seen. There is a mobile cecum protruding into a new midline infraumbilical wide mouth hernia, but no evidence of hernia strangulation or bowel obstruction. There is advanced diverticulosis in the distal descending and sigmoid colon. There is faint stranding adjacent to the distal descending sigmoid concerning for mild acute diverticulitis and not seen previously. There is moderate retained stool in the ascending colon. No abnormal bowel wall thickening or distension. REPRODUCTIVE: The uterus and right ovary are unremarkable. There is a stable 2.2 cm simple appearing left ovarian cyst, hounsfield density is 11.3. No follow-up imaging is recommended. There is spray artifact from a vaginal pessary. PERITONEUM AND RETROPERITONEUM: No  free air, free hemorrhage, or abscess. There is minimal perihepatic ascites. LYMPH NODES: No lymphadenopathy. BONES AND SOFT TISSUES: There is artifact from a left hip arthroplasty, which was seen previously. There is osteopenia, moderate to severe broad-based lumbar levoscoliosis, and degenerative change of the lumbar spine. No focal soft tissue abnormality. IMPRESSION: 1. No evidence of pulmonary embolism to the segmental level, acknowledging motion-limited evaluation. 2. Mild acute diverticulitis in the distal descending colon. No abscess or free air. 3. Asymmetric right-sided bladder wall thickening with mucosal enhancement, recommend urologic referral and consideration of cystoscopy given differential of cystitis versus neoplasm. 4. 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 5. Thyroid  nodule in the inferior left lobe measuring 1.6 cm, recommend nonemergent thyroid  ultrasound if clinically warranted given advanced age. 6. Small pleural effusions with adjacent consolidation or  atelectasis in the lower lobes . 7. Small caliber central airways consistent with bronchospasm or active respiration. 8. Increasing thickening in the left ventricular wall and cardiac septum. Query hypertrophic cardiomyopathy. 9. Aortic and coronary artery atherosclerosis. 10. S-shaped thoracolumbar scoliosis, osteopenia, and degenerative changes . SABRA Electronically signed by: Francis Quam MD 05/07/2024 10:10 PM EST RP Workstation: [id number]   CT ABDOMEN PELVIS W CONTRAST Result Date: 05/07/2024 EXAM: CTA CHEST PE WITH CONTRAST CT ABDOMEN AND PELVIS WITH CONTRAST 05/07/2024 08:44:00 PM TECHNIQUE: CTA of the chest was performed after the administration of 75 mL of iohexol  (OMNIPAQUE ) 350 MG/ML intravenous contrast. Multiplanar reformatted images are provided for review. MIP images are provided for review. CT of the abdomen and pelvis was performed with the administration of intravenous contrast. Automated exposure control, iterative reconstruction, and/or weight based adjustment of the mA/kV was utilized to reduce the radiation dose to as low as reasonably achievable. COMPARISON: AP portable pelvis 12/04/2023,  chest AP portable 12/04/2023, chest, abdomen, and pelvis CT with IV contrast 01/18/2020. CLINICAL HISTORY: Workup for PE. SOB. New O2 requirement. History of prior Crohn disease and history of ileitis, drop in hemoglobin, assess for abdominal hemorrhage or inflammation. FINDINGS: CHEST: PULMONARY ARTERIES: Pulmonary arteries are upper normal for caliber. The subsegmental arterial bed is largely obscured due to respiratory motion, but no arterial embolus is seen at least to the segmental level. There is no substantial pericardial effusion. The pulmonary veins are nondilated. MEDIASTINUM: There is a heterogeneous nodule of the inferior pole of the left lobe of the thyroid  gland measuring 1.6 cm, previously 1 cm. Nonemergent follow-up thyroid  ultrasound is recommended if clinically warranted taking into  account advanced age and life expectancy. There is mild cardiomegaly, interval moderate increased thickening in the left ventricle wall and septum, query hypertrophic cardiomyopathy. Left ventricular free wall thickness is 1.7 cm. There are patchy 3-vessel coronary calcifications. Tortuosity in the great vessels and aorta with moderate aortic atherosclerosis without aneurysm or dissection. There is a patulous upper thoracic esophagus above the carina level with fluid filling. Consider aspiration precautions unless already being done. . No intrathoracic adenopathy is seen. LUNGS AND PLEURA: The trachea and central airways are small in caliber which could be due to active respiration or bronchospasm. There is diffuse bronchial thickening right middle and both lower lobes. Symmetric small layering bilateral pleural effusions with adjacent consolidation or atelectasis in the lower lobes. Mild bilateral apical scarring change noted. The lungs are otherwise clear with evidence of mild volume loss of the right hemithorax and multiple chronic healed right rib cage fracture deformities deforming the right chest wall. There is no overt pulmonary edema. No pneumothorax. SOFT TISSUES AND BONES: There are bilateral breast implants with thick capsular calcifications. Axillary spaces are clear. There is mild to moderate thoracic kyphodextroscoliosis with osteopenia and degenerative changes of the spine. There are additional healed fractures of the left anterolateral ribs. Multiple chronic healed right rib cage fracture deformities deforming the right chest wall. ABDOMEN AND PELVIS: LIVER: The liver is unremarkable. GALLBLADDER AND BILE DUCTS: Gallbladder is unremarkable. No biliary ductal dilatation. SPLEEN: Spleen demonstrates no acute abnormality. PANCREAS: Pancreas demonstrates no acute abnormality. ADRENAL GLANDS: Adrenal glands demonstrate no acute abnormality. There is no adrenal mass. KIDNEYS, URETERS AND BLADDER: No renal  mass. Scattered left renal scarring. 4 mm calyceal stone mid pole left kidney noted. No other nephrolithiasis. No obstructing stones. Bilateral generalized renal cortical thinning is seen. No hydronephrosis. No perinephric or periureteral stranding. The bladder demonstrates asymmetric thickening and mucosal enhancement to the right warranting urologic referral and consideration of cystoscopy. Cystitis or neoplasm could have this appearance. GI AND BOWEL: The stomach and small bowel are unremarkable. An appendix is not seen. There is a mobile cecum protruding into a new midline infraumbilical wide mouth hernia, but no evidence of hernia strangulation or bowel obstruction. There is advanced diverticulosis in the distal descending and sigmoid colon. There is faint stranding adjacent to the distal descending sigmoid concerning for mild acute diverticulitis and not seen previously. There is moderate retained stool in the ascending colon. No abnormal bowel wall thickening or distension. REPRODUCTIVE: The uterus and right ovary are unremarkable. There is a stable 2.2 cm simple appearing left ovarian cyst, hounsfield density is 11.3. No follow-up imaging is recommended. There is spray artifact from a vaginal pessary. PERITONEUM AND RETROPERITONEUM: No  free air, free hemorrhage, or abscess. There is minimal perihepatic ascites. LYMPH NODES: No lymphadenopathy. BONES AND SOFT TISSUES:  There is artifact from a left hip arthroplasty, which was seen previously. There is osteopenia, moderate to severe broad-based lumbar levoscoliosis, and degenerative change of the lumbar spine. No focal soft tissue abnormality. IMPRESSION: 1. No evidence of pulmonary embolism to the segmental level, acknowledging motion-limited evaluation. 2. Mild acute diverticulitis in the distal descending colon. No abscess or free air. 3. Asymmetric right-sided bladder wall thickening with mucosal enhancement, recommend urologic referral and consideration  of cystoscopy given differential of cystitis versus neoplasm. 4. 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 5. Thyroid  nodule in the inferior left lobe measuring 1.6 cm, recommend nonemergent thyroid  ultrasound if clinically warranted given advanced age. 6. Small pleural effusions with adjacent consolidation or atelectasis in the lower lobes . 7. Small caliber central airways consistent with bronchospasm or active respiration. 8. Increasing thickening in the left ventricular wall and cardiac septum. Query hypertrophic cardiomyopathy. 9. Aortic and coronary artery atherosclerosis. 10. S-shaped thoracolumbar scoliosis, osteopenia, and degenerative changes . SABRA Electronically signed by: Francis Quam MD 05/07/2024 10:10 PM EST RP Workstation: [id number]   DG Chest Portable 1 View Result Date: 05/07/2024 CLINICAL DATA:  Sepsis workup. EXAM: PORTABLE CHEST 1 VIEW COMPARISON:  None Available. FINDINGS: The cardiac silhouette is mildly enlarged and unchanged in size. Multiple chronic right-sided rib fractures are seen with chronic right-sided volume loss also noted. There is marked severity calcification of the aortic arch. There is moderate severity left basilar scarring and/or atelectasis. Small bilateral pleural effusions are noted. No pneumothorax is identified. Multilevel degenerative changes are present throughout the thoracic spine. IMPRESSION: 1. Multiple chronic right-sided rib fractures with chronic right-sided volume loss. 2. Moderate severity left basilar scarring and/or atelectasis. 3. Small bilateral pleural effusions. Electronically Signed   By: Suzen Dials M.D.   On: 05/07/2024 20:33        Scheduled Meds:  sodium chloride    Intravenous Once   budesonide   9 mg Oral Daily   docusate sodium   100 mg Oral BID   donepezil   10 mg Oral QHS   feeding supplement   Oral TID BM   ferrous sulfate  325 mg Oral  Q breakfast   mirtazapine   7.5 mg Oral QHS   pantoprazole  40 mg Oral Daily   sucralfate  1 g Oral TID AC & HS   Continuous Infusions:  piperacillin-tazobactam (ZOSYN)  IV 3.375 g (05/08/24 0559)   potassium chloride  Stopped (05/08/24 0910)   [START ON 05/09/2024] vancomycin       LOS: 0 days       Brayton Lye, MD [facility]   To contact the attending provider between 7A-7P or the covering provider during after hours 7P-7A, please log into the web site [email or web address] and access using universal [place] password for that web site. If you do not have the password, please call the hospital operator.  05/08/2024, 10:45 AM

## 2024-05-08 NOTE — ED Notes (Signed)
 Patient cleaned and new brief applied.

## 2024-05-08 NOTE — Progress Notes (Signed)
 Pharmacy Antibiotic Note  [name] is a 89 y.o. female admitted on 05/07/2024 with AMS/fever, possible sepsis.  Pharmacy has been consulted for Vancomycin  dosing.  Vancomycin 1 g IV given in ED at 2120  Plan: Vancomycin 750 mg IV q48h  Height: 5' 5 (165.1 cm) Weight: 49 kg (108 lb 0.4 oz) IBW/kg (Calculated) : 57  Temp (24hrs), Avg:97.5 F (36.4 C), Min:96.7 F (35.9 C), Max:98.2 F (36.8 C)  Recent Labs  Lab 05/03/24 1142 05/07/24 1920 05/07/24 1931  WBC 5.9 11.3*  --   CREATININE 0.65 1.02* 1.10*  LATICACIDVEN  --   --  2.5*    Estimated Creatinine Clearance: 27.3 mL/min (A) (by C-G formula based on SCr of 1.1 mg/dL (H)).    No Active Allergies  Dail Cordella Misty 05/08/2024 3:27 AM

## 2024-05-08 NOTE — Consult Note (Addendum)
 Referring Provider: Dr. Sherlon Primary Care Physician:  System, Provider Not In Primary Gastroenterologist:  Dr. Lupita Commander   Reason for Consultation: Anemia, rectal bleed  HPI: [name] is a 89 y.o. female with a past medical history of hypertension, chronic diastolic CHF, osteoporosis, chronic pain syndrome, hyperparathyroidism s/p parathyroidectomy, vitamin D  deficiency, bladder prolapse s/p pessary, CKD stage III, memory impairment on Aricept , diverticulosis and Crohn's disease on chronic Budesonide .  Patient developed acute respiratory failure with fever and altered mental status per SNF and patient was transported to [facility] via EMS 05/07/2024 for further evaluation.  Labs in the ED showed a WBC count of 11.3.  Hemoglobin 7.0 down from 7.8 on 11/24.  HCT 24.8.  Platelets 320.  Sodium 143.  Potassium 3.0.  BUN 34 up from 19 on 11/24.  Creatinine 1.02 up from 0.65.  Albumin 2.4.  Normal LFTs.  Magnesium 2.1.  Lactic acid 2.5.  Blood cultures in process.  Urinalysis pending.  FOBT positive.  SARS coronavirus 2 negative.  Influenza A and B negative. Chest x-ray showed moderate left basilar scarring/atelectasis, small bilateral pleural effusions and multiple chronic right sided rib fractures.  CTA was negative for PE and showed a left thyroid  nodule measuring 1.6 cm and increasing thickening to the left ventricle wall and cardiac septum. CTAP showed mild acute diverticulitis in the descending colon without abscess or perforation, asymmetric right sided urinary bladder wall thickening concerning for potential cystitis versus neoplasm, mobile cecum protruding into a new midline infraumbilical widemouth hernia without evidence of strangulation or bowel obstruction.  Transfused 1 unit PRBCs, transfusion completed shortly after midnight.  Labs today: WBC 9.4.  Hemoglobin 7.7.  Lactic acid 1.8.  Calcium  3.7.  BUN 28.  Creatinine 0.84.  Normal LFTs.  GI consult was requested for  further evaluation regarding rectal bleeding with acute on chronic anemia.   Patient has memory impairment and is unable to provide any history.  Past medical history was collected per review of epic records.  No family at the bedside.  Her RN stated her son was verbally disrespectful including racial remarks yesterday evening which required security to escort patient son out of the hospital.  At this time, she denies having any nausea or vomiting.  No abdominal pain.  No rectal bleeding or BM overnight or thus far this morning per RN.  No chest pain or shortness of breath.  Not on AC.  On Budesonide  9 mg daily for Crohn's disease.  No other NSAID use per SNF medication list.  Patient has a history of Crohn's disease known by Dr. Commander last seen by him in 2019.  She was recently seen in our outpatient GI clinic by Lauraine Furbish, PA-C 05/03/2024 for further evaluation regarding bright red blood per the rectum x one week.  Laboratory studies including CBC, iron panel, sed rate, CRP and fecal calprotectin levels were ordered.  With plans for IV iron if indicated and to consider a flexible sigmoidoscopy versus colonoscopy in the hospital setting.  Dr. Commander was attempting to contact the patient's son to discuss next steps. Father with history of colon cancer.   Labs 05/03/2024: WBC 5.9.  Hemoglobin 7.8.  MCV 77.6.  Platelets 300.  Iron 10.  Saturation ratios 4.2.  TIBC 240.8.  Ferritin 11.5.  Sed rate 33.  CRP 3.2.  Colonoscopy 12/23/2008 Ileitis-Crohn's in the ileum, biopsies compatible with Crohn's, ischemia, NSAID injury also in differential Moderate diverticulosis in the sigmoid colon Internal hemorrhoids Otherwise normal exam  ECHO 08/21/2018: 1. The left ventricle has hyperdynamic systolic function, with an  ejection fraction of >65%. The cavity size was normal. There is moderately  increased left ventricular wall thickness. Left ventricular diastolic  Doppler parameters are consistent with   impaired relaxation. Elevated mean left atrial pressure.   2. The right ventricle has normal systolic function. The cavity was  normal.   3. The tricuspid valve is grossly normal.   4. The aortic valve is tricuspid Moderate thickening of the aortic valve  Aortic valve regurgitation is mild by color flow Doppler.   5. Vigorous LV systolic function; mild diastolic dysfunction; moderate  LVH with proximal septal thickening and narrow LVOT; elevated LVOT  gradient of approximately 4 m/s with valsalva; mild AI and MR.    Past Medical History:  Diagnosis Date   Acute diastolic (congestive) heart failure (HCC)    Bladder prolapse, female, acquired    pessary in   Chronic diastolic (congestive) heart failure (HCC)    Chronic pain syndrome    Complete uterine prolapse    Crohn's disease (HCC) 2010   Diverticulosis    E. coli UTI 06/09/2013   Fall 05/2013   Hemorrhoids    History of hypercalcemia    History of hyperglycemia    Hydronephrosis    Hyperparathyroidism    Hypertension    Multiple pelvic fractures (HCC) 07/02/2013   Osteoporosis    Pubic ramus fracture (HCC)    Urinary incontinence 09/09/2011   Vitamin D  deficiency     Past Surgical History:  Procedure Laterality Date   COLONOSCOPY  12/23/2008   crohn's, diverticulosis, internal hemorrhoids   HIP FRACTURE SURGERY Left    left, prosthesis   LAPAROSCOPY  10/2009   PARATHYROIDECTOMY Left 08/30/2013   Procedure: LEFT INFERIOR PARATHYROIDECTOMY;  Surgeon: Krystal CHRISTELLA Spinner, MD;  Location: [facility];  Service: General;  Laterality: Left;   RHINOPLASTY     TONSILLECTOMY      Prior to Admission medications   Medication Sig Start Date End Date Taking? Authorizing Provider  budesonide  (ENTOCORT EC ) 3 MG 24 hr capsule Take 3 capsules (9 mg total) by mouth daily. 11/16/18  Yes Avram Lupita BRAVO, MD  Calcium  Carb-Cholecalciferol  (CALCIUM  HIGH POTENCY/VITAMIN D ) 600-5 MG-MCG TABS Take 1 tablet by mouth in the morning and at bedtime.   Yes  [provider]  docusate sodium  (COLACE) 100 MG capsule Take 1 capsule (100 mg total) by mouth 2 (two) times daily. 01/25/20  Yes Vicci Burnard SAUNDERS, PA-C  donepezil  (ARICEPT ) 10 MG tablet Take 1 tablet daily Patient taking differently: Take 10 mg by mouth at bedtime. Take 1 tablet by mouth at bedtime. 11/16/18  Yes Georjean Darice CHRISTELLA, MD  ergocalciferol  (VITAMIN D2) 1.25 MG (50000 UT) capsule Take 50,000 Units by mouth every Friday.   Yes [provider]  estradiol  (ESTRING ) 7.5 MCG/24HR vaginal ring Place 2 mg vaginally. 02/10/24 02/09/25 Yes [provider]  ferrous sulfate 324 MG TBEC Take 324 mg by mouth daily with breakfast.   Yes [provider]  fluticasone (FLONASE) 50 MCG/ACT nasal spray Place 2 sprays into both nostrils daily.   Yes [provider]  hydrocortisone (ANUSOL-HC) 2.5 % rectal cream Place 1 Application rectally every 8 (eight) hours as needed for hemorrhoids or anal itching.   Yes [provider]  mirtazapine  (REMERON ) 7.5 MG tablet Take 7.5 mg by mouth at bedtime.   Yes [provider]  Multiple Vitamin (MULTIVITAMIN) tablet Take 1 tablet by mouth daily.  Yes [provider]  Nutritional Supplements (NUTRITIONAL SHAKE HIGH PROTEIN) LIQD Take 1 Bottle by mouth 3 (three) times daily.   Yes [provider]  Nutritional Supplements (NUTRITIONAL SUPPLEMENT PO) Take 1 each by mouth 2 (two) times daily with a meal. Give one frozen nutritional treat with breakfast and dinner.   Yes [provider]  ondansetron  (ZOFRAN ) 4 MG tablet Take 4 mg by mouth every 4 (four) hours as needed for vomiting or nausea.   Yes [provider]  PROTONIX 40 MG Take 40 mg by mouth in the morning. Give one packet by mouth in the morning for GI protection. 05/05/24  Yes [provider]  sodium chloride  1 g tablet Take 1 g by mouth daily. 01/25/21  Yes [provider]  sucralfate (CARAFATE) 1 g tablet  Take 1 g by mouth 4 (four) times daily. Take one tablet (1g) by mouth every 6 hours for GI protection for 6 weeks. 04/22/24 06/04/24 Yes [provider]    Current Facility-Administered Medications  Medication Dose Route Frequency Provider Last Rate Last Admin   0.9 %  sodium chloride  infusion (Manually program via Guardrails IV Fluids)   Intravenous Once Lemly, Tatum N, MD       budesonide  (ENTOCORT EC ) 24 hr capsule 9 mg  9 mg Oral Daily Lynn, Catherine M, MD       docusate sodium  (COLACE) capsule 100 mg  100 mg Oral BID Macario Dorothyann HERO, MD       donepezil  (ARICEPT ) tablet 10 mg  10 mg Oral QHS Macario Dorothyann HERO, MD       feeding supplement (ENSURE PLUS HIGH PROTEIN) liquid   Oral TID BM Lynn, Catherine M, MD       ferrous sulfate tablet 325 mg  325 mg Oral Q breakfast Macario Dorothyann HERO, MD       mirtazapine  (REMERON ) tablet 7.5 mg  7.5 mg Oral QHS Macario Dorothyann HERO, MD       pantoprazole (PROTONIX) EC tablet 40 mg  40 mg Oral Daily Lynn, Catherine M, MD       piperacillin-tazobactam (ZOSYN) IVPB 3.375 g  3.375 g Intravenous Q8H Elgergawy, Dawood S, MD 12.5 mL/hr at 05/08/24 0559 3.375 g at 05/08/24 0559   potassium chloride  10 mEq in 100 mL IVPB  10 mEq Intravenous Once Lemly, Tatum N, MD       sucralfate (CARAFATE) tablet 1 g  1 g Oral TID AC & HS Lynn, Catherine M, MD   1 g at 05/08/24 0554   [START ON 05/09/2024] vancomycin (VANCOREADY) IVPB 750 mg/150 mL  750 mg Intravenous Q48H Elgergawy, Dawood S, MD        Allergies as of 05/07/2024 - Reviewed 05/07/2024  Allergen Reaction Noted   Other  11/09/2018    Family History  Problem Relation Age of Onset   Heart disease Father    Colon cancer Father    Osteoporosis Mother    Breast cancer Mother    Prostate cancer Brother     Social History   Socioeconomic History   Marital status: Widowed    Spouse name: Not on file   Number of children: 3   Years of education: Not on file   Highest education level: Not on file   Occupational History   Occupation: Retired  Tobacco Use   Smoking status: Never   Smokeless tobacco: Never  Vaping Use   Vaping status: Never Used  Substance and Sexual Activity  Alcohol use: Yes    Alcohol/week: 0.0 standard drinks of alcohol    Comment: occ wine   Drug use: No   Sexual activity: Never    Birth control/protection: Coitus interruptus    Comment: because of prolapse and pessary  Other Topics Concern   Not on file  Social History Narrative   Widowed.  Lives with a caregiver   3 kids.     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   Right handed    Social Drivers of Corporate Investment Banker Strain: Not on file  Food Insecurity: Not on file  Transportation Needs: Not on file  Physical Activity: Not on file  Stress: Not on file  Social Connections: Not on file  Intimate Partner Violence: Not on file    Review of Systems: Limited review of systems secondary to memory impairment/dementia.  Patient denies having any nausea, vomiting, abdominal pain, chest pain or shortness of breath.  Physical Exam: Vital signs in last 24 hours: Temp:  [96.7 F (35.9 C)-98.2 F (36.8 C)] 98 F (36.7 C) (11/29 0400) Pulse Rate:  [87-109] 92 (11/29 0019) Resp:  [18-27] 18 (11/29 0500) BP: (101-132)/(64-88) 109/64 (11/29 0400) SpO2:  [95 %-100 %] 98 % (11/29 0400) Weight:  [49 kg] 49 kg (11/29 0019)   General: Severely malnourished cachectic 89 year old female. Head:  Normocephalic and atraumatic. Eyes:  No scleral icterus. Conjunctiva pink. Ears:  Normal auditory acuity. Nose:  No deformity, discharge or lesions. Mouth: Mouth and lips dry.  No ulcers or lesions. Neck:  Supple. No lymphadenopathy or thyromegaly.  Lungs: Breath sounds clear throughout. No wheezes, rhonchi or crackles.  Heart: Rhythm slightly irregular, no murmur. Abdomen: Soft, nondistended.  Nontender.  Possible sounds all 4  quadrants. Rectal: No external hemorrhoids.  Rectal vault filled with soft brown stool, no obvious blood.  No palpable mass in setting of stool-filled rectum. Stool heme positive for bedside FOBT. Musculoskeletal:  Symmetrical without gross deformities.  Pulses:  Normal pulses noted. Extremities: Bilateral lower extremities are severely contractured. Neurologic:  Alert and to name, patient stated she is in the hospital in [place]. Skin: Right forearm skin tear wound and quarter size sacral decubitus. Psych:  Alert and cooperative. Normal mood and affect.  Not agitated  Intake/Output from previous day: 11/28 0701 - 11/29 0700 In: 1540.3 [P.O.:50; Blood:240; IV Piggyback:1250.3] Out: 200 [Urine:200] Intake/Output this shift: No intake/output data recorded.  Lab Results: Recent Labs    05/07/24 1920 05/07/24 1931 05/08/24 0611  WBC 11.3*  --  9.4  HGB 7.0* 7.8*  7.8* 7.7*  HCT 24.8* 23.0*  23.0* 25.6*  PLT 320  --  252   BMET Recent Labs    05/07/24 1920 05/07/24 1931 05/08/24 0611  NA 143 147*  147* 145  K 3.0* 3.0*  3.0* 3.7  CL 113* 112* 114*  CO2 20*  --  26  GLUCOSE 178* 171* 93  BUN 34* 32* 28*  CREATININE 1.02* 1.10* 0.84  CALCIUM  8.0*  --  8.1*   LFT Recent Labs    05/08/24 0611  PROT 5.0*  ALBUMIN 2.2*  AST 16  ALT 13  ALKPHOS 53  BILITOT 0.6   PT/INR No results for input(s): LABPROT, INR in the last 72 hours. Hepatitis Panel No results for input(s): HEPBSAG, HCVAB, HEPAIGM, HEPBIGM in the last 72 hours.   Studies/Results: CT Angio Chest PE W and/or Wo Contrast Result  Date: 05/07/2024 EXAM: CTA CHEST PE WITH CONTRAST CT ABDOMEN AND PELVIS WITH CONTRAST 05/07/2024 08:44:00 PM TECHNIQUE: CTA of the chest was performed after the administration of 75 mL of iohexol  (OMNIPAQUE ) 350 MG/ML intravenous contrast. Multiplanar reformatted images are provided for review. MIP images are provided for review. CT of the abdomen and pelvis was  performed with the administration of intravenous contrast. Automated exposure control, iterative reconstruction, and/or weight based adjustment of the mA/kV was utilized to reduce the radiation dose to as low as reasonably achievable. COMPARISON: AP portable pelvis 12/04/2023, chest AP portable 12/04/2023, chest, abdomen, and pelvis CT with IV contrast 01/18/2020. CLINICAL HISTORY: Workup for PE. SOB. New O2 requirement. History of prior Crohn disease and history of ileitis, drop in hemoglobin, assess for abdominal hemorrhage or inflammation. FINDINGS: CHEST: PULMONARY ARTERIES: Pulmonary arteries are upper normal for caliber. The subsegmental arterial bed is largely obscured due to respiratory motion, but no arterial embolus is seen at least to the segmental level. There is no substantial pericardial effusion. The pulmonary veins are nondilated. MEDIASTINUM: There is a heterogeneous nodule of the inferior pole of the left lobe of the thyroid  gland measuring 1.6 cm, previously 1 cm. Nonemergent follow-up thyroid  ultrasound is recommended if clinically warranted taking into account advanced age and life expectancy. There is mild cardiomegaly, interval moderate increased thickening in the left ventricle wall and septum, query hypertrophic cardiomyopathy. Left ventricular free wall thickness is 1.7 cm. There are patchy 3-vessel coronary calcifications. Tortuosity in the great vessels and aorta with moderate aortic atherosclerosis without aneurysm or dissection. There is a patulous upper thoracic esophagus above the carina level with fluid filling. Consider aspiration precautions unless already being done. . No intrathoracic adenopathy is seen. LUNGS AND PLEURA: The trachea and central airways are small in caliber which could be due to active respiration or bronchospasm. There is diffuse bronchial thickening right middle and both lower lobes. Symmetric small layering bilateral pleural effusions with adjacent  consolidation or atelectasis in the lower lobes. Mild bilateral apical scarring change noted. The lungs are otherwise clear with evidence of mild volume loss of the right hemithorax and multiple chronic healed right rib cage fracture deformities deforming the right chest wall. There is no overt pulmonary edema. No pneumothorax. SOFT TISSUES AND BONES: There are bilateral breast implants with thick capsular calcifications. Axillary spaces are clear. There is mild to moderate thoracic kyphodextroscoliosis with osteopenia and degenerative changes of the spine. There are additional healed fractures of the left anterolateral ribs. Multiple chronic healed right rib cage fracture deformities deforming the right chest wall. ABDOMEN AND PELVIS: LIVER: The liver is unremarkable. GALLBLADDER AND BILE DUCTS: Gallbladder is unremarkable. No biliary ductal dilatation. SPLEEN: Spleen demonstrates no acute abnormality. PANCREAS: Pancreas demonstrates no acute abnormality. ADRENAL GLANDS: Adrenal glands demonstrate no acute abnormality. There is no adrenal mass. KIDNEYS, URETERS AND BLADDER: No renal mass. Scattered left renal scarring. 4 mm calyceal stone mid pole left kidney noted. No other nephrolithiasis. No obstructing stones. Bilateral generalized renal cortical thinning is seen. No hydronephrosis. No perinephric or periureteral stranding. The bladder demonstrates asymmetric thickening and mucosal enhancement to the right warranting urologic referral and consideration of cystoscopy. Cystitis or neoplasm could have this appearance. GI AND BOWEL: The stomach and small bowel are unremarkable. An appendix is not seen. There is a mobile cecum protruding into a new midline infraumbilical wide mouth hernia, but no evidence of hernia strangulation or bowel obstruction. There is advanced diverticulosis in the distal descending and sigmoid colon. There  is faint stranding adjacent to the distal descending sigmoid concerning for mild  acute diverticulitis and not seen previously. There is moderate retained stool in the ascending colon. No abnormal bowel wall thickening or distension. REPRODUCTIVE: The uterus and right ovary are unremarkable. There is a stable 2.2 cm simple appearing left ovarian cyst, hounsfield density is 11.3. No follow-up imaging is recommended. There is spray artifact from a vaginal pessary. PERITONEUM AND RETROPERITONEUM: No  free air, free hemorrhage, or abscess. There is minimal perihepatic ascites. LYMPH NODES: No lymphadenopathy. BONES AND SOFT TISSUES: There is artifact from a left hip arthroplasty, which was seen previously. There is osteopenia, moderate to severe broad-based lumbar levoscoliosis, and degenerative change of the lumbar spine. No focal soft tissue abnormality. IMPRESSION: 1. No evidence of pulmonary embolism to the segmental level, acknowledging motion-limited evaluation. 2. Mild acute diverticulitis in the distal descending colon. No abscess or free air. 3. Asymmetric right-sided bladder wall thickening with mucosal enhancement, recommend urologic referral and consideration of cystoscopy given differential of cystitis versus neoplasm. 4. 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 5. Thyroid  nodule in the inferior left lobe measuring 1.6 cm, recommend nonemergent thyroid  ultrasound if clinically warranted given advanced age. 6. Small pleural effusions with adjacent consolidation or atelectasis in the lower lobes . 7. Small caliber central airways consistent with bronchospasm or active respiration. 8. Increasing thickening in the left ventricular wall and cardiac septum. Query hypertrophic cardiomyopathy. 9. Aortic and coronary artery atherosclerosis. 10. S-shaped thoracolumbar scoliosis, osteopenia, and degenerative changes . SABRA Electronically signed by: Francis Quam MD 05/07/2024 10:10 PM EST RP Workstation:  [id number]   CT ABDOMEN PELVIS W CONTRAST Result Date: 05/07/2024 EXAM: CTA CHEST PE WITH CONTRAST CT ABDOMEN AND PELVIS WITH CONTRAST 05/07/2024 08:44:00 PM TECHNIQUE: CTA of the chest was performed after the administration of 75 mL of iohexol  (OMNIPAQUE ) 350 MG/ML intravenous contrast. Multiplanar reformatted images are provided for review. MIP images are provided for review. CT of the abdomen and pelvis was performed with the administration of intravenous contrast. Automated exposure control, iterative reconstruction, and/or weight based adjustment of the mA/kV was utilized to reduce the radiation dose to as low as reasonably achievable. COMPARISON: AP portable pelvis 12/04/2023, chest AP portable 12/04/2023, chest, abdomen, and pelvis CT with IV contrast 01/18/2020. CLINICAL HISTORY: Workup for PE. SOB. New O2 requirement. History of prior Crohn disease and history of ileitis, drop in hemoglobin, assess for abdominal hemorrhage or inflammation. FINDINGS: CHEST: PULMONARY ARTERIES: Pulmonary arteries are upper normal for caliber. The subsegmental arterial bed is largely obscured due to respiratory motion, but no arterial embolus is seen at least to the segmental level. There is no substantial pericardial effusion. The pulmonary veins are nondilated. MEDIASTINUM: There is a heterogeneous nodule of the inferior pole of the left lobe of the thyroid  gland measuring 1.6 cm, previously 1 cm. Nonemergent follow-up thyroid  ultrasound is recommended if clinically warranted taking into account advanced age and life expectancy. There is mild cardiomegaly, interval moderate increased thickening in the left ventricle wall and septum, query hypertrophic cardiomyopathy. Left ventricular free wall thickness is 1.7 cm. There are patchy 3-vessel coronary calcifications. Tortuosity in the great vessels and aorta with moderate aortic atherosclerosis without aneurysm or dissection. There is a patulous upper thoracic esophagus  above the carina level with fluid filling. Consider aspiration precautions unless already being done. . No intrathoracic adenopathy is seen. LUNGS AND PLEURA: The trachea and central airways are small in  caliber which could be due to active respiration or bronchospasm. There is diffuse bronchial thickening right middle and both lower lobes. Symmetric small layering bilateral pleural effusions with adjacent consolidation or atelectasis in the lower lobes. Mild bilateral apical scarring change noted. The lungs are otherwise clear with evidence of mild volume loss of the right hemithorax and multiple chronic healed right rib cage fracture deformities deforming the right chest wall. There is no overt pulmonary edema. No pneumothorax. SOFT TISSUES AND BONES: There are bilateral breast implants with thick capsular calcifications. Axillary spaces are clear. There is mild to moderate thoracic kyphodextroscoliosis with osteopenia and degenerative changes of the spine. There are additional healed fractures of the left anterolateral ribs. Multiple chronic healed right rib cage fracture deformities deforming the right chest wall. ABDOMEN AND PELVIS: LIVER: The liver is unremarkable. GALLBLADDER AND BILE DUCTS: Gallbladder is unremarkable. No biliary ductal dilatation. SPLEEN: Spleen demonstrates no acute abnormality. PANCREAS: Pancreas demonstrates no acute abnormality. ADRENAL GLANDS: Adrenal glands demonstrate no acute abnormality. There is no adrenal mass. KIDNEYS, URETERS AND BLADDER: No renal mass. Scattered left renal scarring. 4 mm calyceal stone mid pole left kidney noted. No other nephrolithiasis. No obstructing stones. Bilateral generalized renal cortical thinning is seen. No hydronephrosis. No perinephric or periureteral stranding. The bladder demonstrates asymmetric thickening and mucosal enhancement to the right warranting urologic referral and consideration of cystoscopy. Cystitis or neoplasm could have this  appearance. GI AND BOWEL: The stomach and small bowel are unremarkable. An appendix is not seen. There is a mobile cecum protruding into a new midline infraumbilical wide mouth hernia, but no evidence of hernia strangulation or bowel obstruction. There is advanced diverticulosis in the distal descending and sigmoid colon. There is faint stranding adjacent to the distal descending sigmoid concerning for mild acute diverticulitis and not seen previously. There is moderate retained stool in the ascending colon. No abnormal bowel wall thickening or distension. REPRODUCTIVE: The uterus and right ovary are unremarkable. There is a stable 2.2 cm simple appearing left ovarian cyst, hounsfield density is 11.3. No follow-up imaging is recommended. There is spray artifact from a vaginal pessary. PERITONEUM AND RETROPERITONEUM: No  free air, free hemorrhage, or abscess. There is minimal perihepatic ascites. LYMPH NODES: No lymphadenopathy. BONES AND SOFT TISSUES: There is artifact from a left hip arthroplasty, which was seen previously. There is osteopenia, moderate to severe broad-based lumbar levoscoliosis, and degenerative change of the lumbar spine. No focal soft tissue abnormality. IMPRESSION: 1. No evidence of pulmonary embolism to the segmental level, acknowledging motion-limited evaluation. 2. Mild acute diverticulitis in the distal descending colon. No abscess or free air. 3. Asymmetric right-sided bladder wall thickening with mucosal enhancement, recommend urologic referral and consideration of cystoscopy given differential of cystitis versus neoplasm. 4. 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 5. Thyroid  nodule in the inferior left lobe measuring 1.6 cm, recommend nonemergent thyroid  ultrasound if clinically warranted given advanced age. 6. Small pleural effusions with adjacent consolidation or atelectasis in the lower lobes .  7. Small caliber central airways consistent with bronchospasm or active respiration. 8. Increasing thickening in the left ventricular wall and cardiac septum. Query hypertrophic cardiomyopathy. 9. Aortic and coronary artery atherosclerosis. 10. S-shaped thoracolumbar scoliosis, osteopenia, and degenerative changes . SABRA Electronically signed by: Francis Quam MD 05/07/2024 10:10 PM EST RP Workstation: [id number]   DG Chest Portable 1 View Result Date: 05/07/2024 CLINICAL DATA:  Sepsis workup. EXAM: PORTABLE CHEST 1 VIEW COMPARISON:  None Available. FINDINGS: The cardiac silhouette is mildly enlarged and unchanged in size. Multiple chronic right-sided rib fractures are seen with chronic right-sided volume loss also noted. There is marked severity calcification of the aortic arch. There is moderate severity left basilar scarring and/or atelectasis. Small bilateral pleural effusions are noted. No pneumothorax is identified. Multilevel degenerative changes are present throughout the thoracic spine. IMPRESSION: 1. Multiple chronic right-sided rib fractures with chronic right-sided volume loss. 2. Moderate severity left basilar scarring and/or atelectasis. 3. Small bilateral pleural effusions. Electronically Signed   By: Suzen Dials M.D.   On: 05/07/2024 20:33    IMPRESSION/PLAN:  89 year old female admitted with acute respiratory failure, fever, sepsis and altered mental status.  Elevated lactic acid level.  Chest x-ray showed moderate left basilar scarring/atelectasis and small bilateral pleural effusions. CTA was negative for PE. Possible PNA. On Zosyn and Vancomycin IV. - Management per the hospitalist  Mild acute diverticulitis in the descending colon without abscess or perforation per CTAP.  IV antibiotics. - NPO - IV fluids and pain management per the hospitalist  Bright red blood per the rectum. Rectal exam today showed a large amount of soft brown stool, heme positive.  No bright red blood. -  No plans for urgent flexible sigmoidoscopy at this time - Await further recommendations per Dr. Abran - Will need to establish goals of care during this hospitalization  Iron deficiency anemia, likely acute on chronic. Hg 7.0 -> transfused one unit of PRBCs -> Hg 7.8 -> today Hg 7.7.  On oral iron. FOBT +.  - Transfuse for hemoglobin level less than 7 - Continue Pantoprazole 40 mg daily and sucralfate 1 g 4 times daily for now  History of Crohn's disease on chronic Budesonide .  Colonoscopy 12/2008 showed chronic active ileitis and moderate diverticulosis in the sigmoid colon. - Continue Budesonide  9 mg daily for now as she takes it chronically, may need to eventually wean off  Severe malnutrition - Recommend RD consult   CTAP showed possible urinary bladder cystitis.  Urinalysis pending. - Management per the hospitalist  Right forearm skin tear and sacral decubitus - Management per the medical service - Recommend wound care consult  History of chronic diastolic CHF.  LVEF greater than 65% per echo 08/2018.   Elida CHRISTELLA Shawl  05/08/2024, 9:49 AM  GI ATTENDING  History, laboratories, x-rays, prior endoscopy report, outpatient GI notes personally reviewed.  Patient seen and examined as outlined above.  Agree with comprehensive consultation note.  89 year old female with multiple medical problems as noted.  Admitted with acute respiratory failure and sepsis.  IMPRESSION: 1.  Acute respiratory failure with fever and sepsis.  Possible pneumonia.  On antibiotics 2.  Iron deficiency anemia without bleeding.  Etiology unclear.  Last colonoscopy 2010 3.  Possible diverticulitis on CT.  Abdominal exam benign.  On appropriate antibiotics 4.  Ileal Crohn's.  Has been on budesonide  5.  Multiple other medical problems including memory impairment  RECOMMENDATIONS: 1.  Treat acute problems as you are doing 2.  Recommend IV iron infusion while in the hospital. 3.  Transfuse as needed 4.   Continue PPI for upper GI mucosal protection, and budesonide  for history of ileal Crohn's.  No obvious Crohn's or GI mass lesions on CT.  Okay to stop Carafate 4.  GI will check back on her on Monday.  Long-term, colonoscopy and upper endoscopy may be helpful in terms of evaluating her iron deficiency anemia.  However, she is quite frail and is dealing  with acute issues currently.  This may limit her workup.  Will follow.  Norleen SAILOR. Abran Raddle., M.D. [facility] Division of Gastroenterology

## 2024-05-08 NOTE — Consult Note (Addendum)
 WOC Nurse Consult Note: Reason for Consult: multiple wounds on admission  Wound type: 1.  Deep tissue Pressure injury sacrum/upper buttocks evolving into unstageable; purple maroon discoloration with developing soft eschar 2.  Stage 3 Pressure Injury lower mid spine unable to visualize wound bed appears red moist no obvious necrotic tissue  3.  Full thickness R forearm, R upper arm r/t trauma pink moist some skin flap remaining  Pressure Injury POA: Yes Measurement: see nursing flowsheet  Wound bed:as above  Drainage (amount, consistency, odor) see nursing flowsheet  Periwound:ecchymosis noted to arms/surrounding skin tears  Dressing procedure/placement/frequency:  Cleanse sacral wound with Vashe, apply 1/4 thick layer of Santyl to wound bed, top with saline moist gauze and dry gauze. Secure with silicone foam or ABD pad and clothe tape whichever is preferred.  Cleanse lower mid back/spine wound with Vashe, using a Q tip applicator insert a small piece of Vashe moistened gauze into wound bed daily leaving a significant portion hanging out of wound opening for easy removal.  Cover with dry gauze and silicone foam. Cleanse all skin tears R arm with NS, apply Vaseline gauze (Lawson [id number]) to wound beds daily and secure with silicone foam or ABD pad and Kerlix roll gauze whichever is preferred. Soak dressing with normal saline if adhered to wound bed for atraumatic removal.   POC discussed with bedside nurse. WOC team will not follow. Reconsult if further needs arise.    Thank you,    Powell Bar MSN, RN-BC, [employer]

## 2024-05-09 ENCOUNTER — Observation Stay (HOSPITAL_COMMUNITY): Payer: Medicare (Managed Care)

## 2024-05-09 DIAGNOSIS — K922 Gastrointestinal hemorrhage, unspecified: Secondary | ICD-10-CM | POA: Diagnosis not present

## 2024-05-09 LAB — URINE CULTURE: Culture: NO GROWTH

## 2024-05-09 LAB — BASIC METABOLIC PANEL WITH GFR
Anion gap: 12 (ref 5–15)
BUN: 25 mg/dL — ABNORMAL HIGH (ref 8–23)
CO2: 21 mmol/L — ABNORMAL LOW (ref 22–32)
Calcium: 8.5 mg/dL — ABNORMAL LOW (ref 8.9–10.3)
Chloride: 109 mmol/L (ref 98–111)
Creatinine, Ser: 0.78 mg/dL (ref 0.44–1.00)
GFR, Estimated: >60 mL/min (ref >60)
Glucose, Bld: 73 mg/dL (ref 70–99)
Potassium: 3.2 mmol/L — ABNORMAL LOW (ref 3.5–5.1)
Sodium: 142 mmol/L (ref 135–145)

## 2024-05-09 LAB — CBC
HCT: 29.7 % — ABNORMAL LOW (ref 36.0–46.0)
Hemoglobin: 8.9 g/dL — ABNORMAL LOW (ref 12.0–15.0)
MCH: 24.5 pg — ABNORMAL LOW (ref 26.0–34.0)
MCHC: 30 g/dL (ref 30.0–36.0)
MCV: 81.6 fL (ref 80.0–100.0)
Platelets: 297 K/uL (ref 150–400)
RBC: 3.64 MIL/uL — ABNORMAL LOW (ref 3.87–5.11)
RDW: 15.7 % — ABNORMAL HIGH (ref 11.5–15.5)
WBC: 8.2 K/uL (ref 4.0–10.5)
nRBC: 0 % (ref 0.0–0.2)

## 2024-05-09 LAB — MAGNESIUM: Magnesium: 2.2 mg/dL (ref 1.7–2.4)

## 2024-05-09 LAB — PHOSPHORUS: Phosphorus: 2.6 mg/dL (ref 2.5–4.6)

## 2024-05-09 MED ORDER — ALBUTEROL SULFATE (2.5 MG/3ML) 0.083% IN NEBU
2.5000 mg | INHALATION_SOLUTION | RESPIRATORY_TRACT | Status: DC | PRN
  Administered 2024-05-10 – 2024-05-12 (×3): 2.5 mg via RESPIRATORY_TRACT
  Filled 2024-05-09 (×4): qty 3

## 2024-05-09 MED ORDER — METOPROLOL TARTRATE 25 MG PO TABS
25.0000 mg | ORAL_TABLET | Freq: Two times a day (BID) | ORAL | Status: DC
  Administered 2024-05-09 – 2024-05-17 (×14): 25 mg via ORAL
  Filled 2024-05-09 (×15): qty 1

## 2024-05-09 MED ORDER — POTASSIUM CHLORIDE CRYS ER 20 MEQ PO TBCR
40.0000 meq | EXTENDED_RELEASE_TABLET | Freq: Four times a day (QID) | ORAL | Status: AC
  Administered 2024-05-09 (×2): 40 meq via ORAL
  Filled 2024-05-09 (×2): qty 2

## 2024-05-09 MED ORDER — GUAIFENESIN-DM 100-10 MG/5ML PO SYRP
10.0000 mL | ORAL_SOLUTION | ORAL | Status: DC | PRN
  Administered 2024-05-09 – 2024-05-10 (×2): 10 mL via ORAL
  Filled 2024-05-09 (×2): qty 10

## 2024-05-09 NOTE — Care Management Obs Status (Signed)
 MEDICARE OBSERVATION STATUS NOTIFICATION   Patient Details  Name: [name] MRN: [id number] Date of Birth: Nov 07, 1932   Medicare Observation Status Notification Given:  Yes    Marval Gell, RN 05/09/2024, 9:46 AM

## 2024-05-09 NOTE — Plan of Care (Signed)
   Problem: Coping: Goal: Level of anxiety will decrease Outcome: Progressing

## 2024-05-09 NOTE — Plan of Care (Signed)
 Pt placed on mitts around midnight as patient had continued to pull her tele leads off and took the dressing of her IV out. Throughout the night pt has been successful at removing her mitts and removing her tele leads after this multiple times.   On call provider paged to get clarification on diet order. Awaiting response. New IV placed by IV team and IV antibiotics administered    Problem: Education: Goal: Knowledge of General Education information will improve Description: Including pain rating scale, medication(s)/side effects and non-pharmacologic comfort measures Outcome: Progressing   Problem: Clinical Measurements: Goal: Will remain free from infection Outcome: Progressing Goal: Respiratory complications will improve Outcome: Progressing   Problem: Elimination: Goal: Will not experience complications related to bowel motility Outcome: Progressing Goal: Will not experience complications related to urinary retention Outcome: Progressing   Problem: Safety: Goal: Ability to remain free from injury will improve Outcome: Progressing   Problem: Skin Integrity: Goal: Risk for impaired skin integrity will decrease Outcome: Progressing

## 2024-05-09 NOTE — Progress Notes (Signed)
 PROGRESS NOTE    [name]  FMW:[id number] DOB: 06/20/1932 DOA: 05/07/2024 PCP: System, Provider Not In   Chief Complaint  Patient presents with   Respiratory Distress    Brief Narrative:    [name] is a 89 y.o. female with medical history significant for CHF, Crohn's disease, diverticulosis, uterovaginal prolapse with pessary, hemorrhoids, hyperparathyroidism s/p prior parathyroidectomy, HTN, HLD, osteoporosis, vitamin D  deficiency, CKD 3, protein calorie malnutrition.  Patient brought into ED by EMS from nursing home for increasing confusion and fever.   ED Course: Upon patient arrival, she was tachypneic with SpO2 in low 90s.  Was started on CPAP, but then weaned quickly to 3 L O2 via [place].  Transfused 1 unit of blood given hemoglobin of 7 and positive occult blood in stool sample.  For hypokalemia, given 20 mEq p.o. potassium; additional 10 mEq IV was ordered but not given.  Assessment & Plan:   Principal Problem:   GIB (gastrointestinal bleeding) Active Problems:   Iron deficiency anemia due to chronic blood loss   Heme positive stool   Diverticulitis   Abnormal CT of the abdomen   History of Crohn's disease  Sepsis, POA -Blood cultures, UA is pending. - CT chest/abdomen/pelvis significant for diverticulitis. - Continue with IV Zosyn, will narrow and DC IV vancomycin.  Acute diverticulitis  tolerating full liquid diet, will advance to soft diet - Continue with IV Zosyn, likely transition to Augmentin in a.m.  Right red blood per rectum Occult positive stool Iron deficiency anemia -Received 1 unit PRBC overnight -Continue with PPI and Carafate -Management per GI - Recent anemia workup significant for low ferritin and iron level, continue with p.o. iron, hold on IV iron due to sepsis on presentation - B12 borderline, will start on supplements will continue with IM given known history of chron's disease and poor GI absorption - Hemoglobin remained  stable  Acute metabolic encephalopathy Underlying significant dementia - Worsening mental status in the setting of sepsis, dehydration - With underlying advanced dementia, high risk for delirium - Start on B12 supplements  History of chron's disease -On chronic budesonide  -Continue with budesonide  per GI  Severe protein calorie malnutrition -RD consult pending  Abnormal findings in urinary bladder on imaging (cystitis versus neoplasm) - Most likely due to cystitis - Outpatient referral to urology, she is already having urologist she is following with them [facility]  Right forearm skin tear -Wound care consulted  Chronic diastolic CHF - Presents with volume overload  Hypertension - Pressures elevated, as well she has mild tachycardia, so we will start on low-dose metoprolol - Telemetry [place] events significant for atrial arrhythmias, but no evidence of A-fib, as few readings on telemetry monitor reads A-fib)  Hypernatremia -Due to volume depletion/dehydration, resolved with hydration  Hypokalemia - Mains low this morning, replaced  Dementia - Continue with supportive care - Continue with home Aricept     DVT prophylaxis: ( SCD) Code Status: (Fulls) Family Communication: (Discussed with son at bedside 11/29, left voice message on 11/30 Disposition:   Status is: Sepsis, on IV antibiotics, still undergoing workup per GI   Consultants:  Gastroenterology     Subjective:  Tolerating full liquid diet, otherwise no significant events as discussed with staff  Objective: Vitals:   05/09/24 0341 05/09/24 0350 05/09/24 0809 05/09/24 1203  BP: (!) 160/81  (!) 158/68 (!) 155/70  Pulse: 81  83 94  Resp: (!) 24  (!) 35 16  Temp:  97.9 F (36.6 C) 97.7 F (  36.5 C) (!) 97.5 F (36.4 C)  TempSrc:  Oral Oral Oral  SpO2: 93%  95% 98%  Weight:      Height:        Intake/Output Summary (Last 24 hours) at 05/09/2024 1400 Last data filed at 05/09/2024 0351 Gross per 24  hour  Intake 150 ml  Output 200 ml  Net -50 ml   Filed Weights   05/08/24 0019  Weight: 49 kg    Examination:  Awake, alert, demented, no apparent distress Good air entry, clear to auscultation, mildly tachypneic but in no respiratory distress Tachycardic but regular Sounds present, abdomen soft Extremities with no edema, contracted      Data Reviewed: I have personally reviewed following labs and imaging studies  CBC: Recent Labs  Lab 05/03/24 1142 05/07/24 1920 05/07/24 1931 05/08/24 0611 05/08/24 1600 05/09/24 0347  WBC 5.9 11.3*  --  9.4 10.0 8.2  NEUTROABS 4.4 9.7*  --   --   --   --   HGB 7.8 Repeated and verified X2.* 7.0* 7.8*  7.8* 7.7* 8.6* 8.9*  HCT 24.6 Repeated and verified X2.* 24.8* 23.0*  23.0* 25.6* 28.3* 29.7*  MCV 77.6* 84.4  --  81.8 81.6 81.6  PLT 300.0 320  --  252 271 297    Basic Metabolic Panel: Recent Labs  Lab 05/03/24 1142 05/07/24 1920 05/07/24 1931 05/08/24 0611 05/09/24 0347  NA 140 143 147*  147* 145 142  K 3.9 3.0* 3.0*  3.0* 3.7 3.2*  CL 108 113* 112* 114* 109  CO2 27 20*  --  26 21*  GLUCOSE 93 178* 171* 93 73  BUN 19 34* 32* 28* 25*  CREATININE 0.65 1.02* 1.10* 0.84 0.78  CALCIUM  8.9 8.0*  --  8.1* 8.5*  MG  --  2.1  --  2.1 2.2  PHOS  --   --   --  2.6 2.6    GFR: Estimated Creatinine Clearance: 37.6 mL/min (by C-G formula based on SCr of 0.78 mg/dL).  Liver Function Tests: Recent Labs  Lab 05/03/24 1142 05/07/24 1920 05/08/24 0611  AST 12 19 16   ALT 10 13 13   ALKPHOS 80 71 53  BILITOT 0.6 0.4 0.6  PROT 6.3 5.5* 5.0*  ALBUMIN 3.3* 2.4* 2.2*    CBG: Recent Labs  Lab 05/07/24 1937  GLUCAP 178*     Recent Results (from the past 240 hours)  Resp panel by RT-PCR (RSV, Flu A&B, Covid) Anterior Nasal Swab     Status: None   Collection Time: 05/07/24  7:06 PM   Specimen: Anterior Nasal Swab  Result Value Ref Range Status   SARS Coronavirus 2 by RT PCR NEGATIVE NEGATIVE Final   Influenza A by  PCR NEGATIVE NEGATIVE Final   Influenza B by PCR NEGATIVE NEGATIVE Final    Comment: (NOTE) The Xpert Xpress SARS-CoV-2/FLU/RSV plus assay is intended as an aid in the diagnosis of influenza from Nasopharyngeal swab specimens and should not be used as a sole basis for treatment. Nasal washings and aspirates are unacceptable for Xpert Xpress SARS-CoV-2/FLU/RSV testing.  Fact Sheet for Patients: bloggercourse.com  Fact Sheet for Healthcare Providers: seriousbroker.it  This test is not yet approved or cleared by the [place]  FDA and has been authorized for detection and/or diagnosis of SARS-CoV-2 by FDA under an Emergency Use Authorization (EUA). This EUA will remain in effect (meaning this test can be used) for the duration of the COVID-19 declaration under Section 564(b)(1) of the Act,  21 U.S.C. section 360bbb-3(b)(1), unless the authorization is terminated or revoked.     Resp Syncytial Virus by PCR NEGATIVE NEGATIVE Final    Comment: (NOTE) Fact Sheet for Patients: bloggercourse.com  Fact Sheet for Healthcare Providers: seriousbroker.it  This test is not yet approved or cleared by the [place]  FDA and has been authorized for detection and/or diagnosis of SARS-CoV-2 by FDA under an Emergency Use Authorization (EUA). This EUA will remain in effect (meaning this test can be used) for the duration of the COVID-19 declaration under Section 564(b)(1) of the Act, 21 U.S.C. section 360bbb-3(b)(1), unless the authorization is terminated or revoked.  Performed at [facility], [address], [place], [place] [address]   Blood culture (routine x 2)     Status: None (Preliminary result)   Collection Time: 05/07/24  7:20 PM   Specimen: BLOOD LEFT FOREARM  Result Value Ref Range Status   Specimen Description BLOOD LEFT FOREARM  Final   Special Requests   Final     BOTTLES DRAWN AEROBIC AND ANAEROBIC Blood Culture adequate volume   Culture   Final    NO GROWTH 2 DAYS Performed at [facility], [address], [place], [place] [address]    Report Status PENDING  Incomplete  Blood culture (routine x 2)     Status: None (Preliminary result)   Collection Time: 05/08/24  6:11 AM   Specimen: BLOOD RIGHT ARM  Result Value Ref Range Status   Specimen Description BLOOD RIGHT ARM  Final   Special Requests   Final    BOTTLES DRAWN AEROBIC AND ANAEROBIC Blood Culture adequate volume   Culture   Final    NO GROWTH 1 DAY Performed at [facility], [address], [place], [place] [address]    Report Status PENDING  Incomplete  Urine Culture (for pregnant, neutropenic or urologic patients or patients with an indwelling urinary catheter)     Status: None   Collection Time: 05/08/24  1:23 PM   Specimen: Urine, Clean Catch  Result Value Ref Range Status   Specimen Description URINE, CLEAN CATCH  Final   Special Requests NONE  Final   Culture   Final    NO GROWTH Performed at [facility], [address], [place], [place] [address]    Report Status 05/09/2024 FINAL  Final         Radiology Studies: CT Angio Chest PE W and/or Wo Contrast Result Date: 05/07/2024 EXAM: CTA CHEST PE WITH CONTRAST CT ABDOMEN AND PELVIS WITH CONTRAST 05/07/2024 08:44:00 PM TECHNIQUE: CTA of the chest was performed after the administration of 75 mL of iohexol  (OMNIPAQUE ) 350 MG/ML intravenous contrast. Multiplanar reformatted images are provided for review. MIP images are provided for review. CT of the abdomen and pelvis was performed with the administration of intravenous contrast. Automated exposure control, iterative reconstruction, and/or weight based adjustment of the mA/kV was utilized to reduce the radiation dose to as low as reasonably achievable. COMPARISON: AP portable pelvis 12/04/2023, chest AP portable 12/04/2023, chest, abdomen, and pelvis CT with IV  contrast 01/18/2020. CLINICAL HISTORY: Workup for PE. SOB. New O2 requirement. History of prior Crohn disease and history of ileitis, drop in hemoglobin, assess for abdominal hemorrhage or inflammation. FINDINGS: CHEST: PULMONARY ARTERIES: Pulmonary arteries are upper normal for caliber. The subsegmental arterial bed is largely obscured due to respiratory motion, but no arterial embolus is seen at least to the segmental level. There is no substantial pericardial  effusion. The pulmonary veins are nondilated. MEDIASTINUM: There is a heterogeneous nodule of the inferior pole of the left lobe of the thyroid  gland measuring 1.6 cm, previously 1 cm. Nonemergent follow-up thyroid  ultrasound is recommended if clinically warranted taking into account advanced age and life expectancy. There is mild cardiomegaly, interval moderate increased thickening in the left ventricle wall and septum, query hypertrophic cardiomyopathy. Left ventricular free wall thickness is 1.7 cm. There are patchy 3-vessel coronary calcifications. Tortuosity in the great vessels and aorta with moderate aortic atherosclerosis without aneurysm or dissection. There is a patulous upper thoracic esophagus above the carina level with fluid filling. Consider aspiration precautions unless already being done. . No intrathoracic adenopathy is seen. LUNGS AND PLEURA: The trachea and central airways are small in caliber which could be due to active respiration or bronchospasm. There is diffuse bronchial thickening right middle and both lower lobes. Symmetric small layering bilateral pleural effusions with adjacent consolidation or atelectasis in the lower lobes. Mild bilateral apical scarring change noted. The lungs are otherwise clear with evidence of mild volume loss of the right hemithorax and multiple chronic healed right rib cage fracture deformities deforming the right chest wall. There is no overt pulmonary edema. No pneumothorax. SOFT TISSUES AND BONES:  There are bilateral breast implants with thick capsular calcifications. Axillary spaces are clear. There is mild to moderate thoracic kyphodextroscoliosis with osteopenia and degenerative changes of the spine. There are additional healed fractures of the left anterolateral ribs. Multiple chronic healed right rib cage fracture deformities deforming the right chest wall. ABDOMEN AND PELVIS: LIVER: The liver is unremarkable. GALLBLADDER AND BILE DUCTS: Gallbladder is unremarkable. No biliary ductal dilatation. SPLEEN: Spleen demonstrates no acute abnormality. PANCREAS: Pancreas demonstrates no acute abnormality. ADRENAL GLANDS: Adrenal glands demonstrate no acute abnormality. There is no adrenal mass. KIDNEYS, URETERS AND BLADDER: No renal mass. Scattered left renal scarring. 4 mm calyceal stone mid pole left kidney noted. No other nephrolithiasis. No obstructing stones. Bilateral generalized renal cortical thinning is seen. No hydronephrosis. No perinephric or periureteral stranding. The bladder demonstrates asymmetric thickening and mucosal enhancement to the right warranting urologic referral and consideration of cystoscopy. Cystitis or neoplasm could have this appearance. GI AND BOWEL: The stomach and small bowel are unremarkable. An appendix is not seen. There is a mobile cecum protruding into a new midline infraumbilical wide mouth hernia, but no evidence of hernia strangulation or bowel obstruction. There is advanced diverticulosis in the distal descending and sigmoid colon. There is faint stranding adjacent to the distal descending sigmoid concerning for mild acute diverticulitis and not seen previously. There is moderate retained stool in the ascending colon. No abnormal bowel wall thickening or distension. REPRODUCTIVE: The uterus and right ovary are unremarkable. There is a stable 2.2 cm simple appearing left ovarian cyst, hounsfield density is 11.3. No follow-up imaging is recommended. There is spray  artifact from a vaginal pessary. PERITONEUM AND RETROPERITONEUM: No  free air, free hemorrhage, or abscess. There is minimal perihepatic ascites. LYMPH NODES: No lymphadenopathy. BONES AND SOFT TISSUES: There is artifact from a left hip arthroplasty, which was seen previously. There is osteopenia, moderate to severe broad-based lumbar levoscoliosis, and degenerative change of the lumbar spine. No focal soft tissue abnormality. IMPRESSION: 1. No evidence of pulmonary embolism to the segmental level, acknowledging motion-limited evaluation. 2. Mild acute diverticulitis in the distal descending colon. No abscess or free air. 3. Asymmetric right-sided bladder wall thickening with mucosal enhancement, recommend urologic referral and consideration of cystoscopy given differential  of cystitis versus neoplasm. 4. 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 5. Thyroid  nodule in the inferior left lobe measuring 1.6 cm, recommend nonemergent thyroid  ultrasound if clinically warranted given advanced age. 6. Small pleural effusions with adjacent consolidation or atelectasis in the lower lobes . 7. Small caliber central airways consistent with bronchospasm or active respiration. 8. Increasing thickening in the left ventricular wall and cardiac septum. Query hypertrophic cardiomyopathy. 9. Aortic and coronary artery atherosclerosis. 10. S-shaped thoracolumbar scoliosis, osteopenia, and degenerative changes . SABRA Electronically signed by: Francis Quam MD 05/07/2024 10:10 PM EST RP Workstation: [id number]   CT ABDOMEN PELVIS W CONTRAST Result Date: 05/07/2024 EXAM: CTA CHEST PE WITH CONTRAST CT ABDOMEN AND PELVIS WITH CONTRAST 05/07/2024 08:44:00 PM TECHNIQUE: CTA of the chest was performed after the administration of 75 mL of iohexol  (OMNIPAQUE ) 350 MG/ML intravenous contrast. Multiplanar reformatted images are provided for review. MIP images  are provided for review. CT of the abdomen and pelvis was performed with the administration of intravenous contrast. Automated exposure control, iterative reconstruction, and/or weight based adjustment of the mA/kV was utilized to reduce the radiation dose to as low as reasonably achievable. COMPARISON: AP portable pelvis 12/04/2023, chest AP portable 12/04/2023, chest, abdomen, and pelvis CT with IV contrast 01/18/2020. CLINICAL HISTORY: Workup for PE. SOB. New O2 requirement. History of prior Crohn disease and history of ileitis, drop in hemoglobin, assess for abdominal hemorrhage or inflammation. FINDINGS: CHEST: PULMONARY ARTERIES: Pulmonary arteries are upper normal for caliber. The subsegmental arterial bed is largely obscured due to respiratory motion, but no arterial embolus is seen at least to the segmental level. There is no substantial pericardial effusion. The pulmonary veins are nondilated. MEDIASTINUM: There is a heterogeneous nodule of the inferior pole of the left lobe of the thyroid  gland measuring 1.6 cm, previously 1 cm. Nonemergent follow-up thyroid  ultrasound is recommended if clinically warranted taking into account advanced age and life expectancy. There is mild cardiomegaly, interval moderate increased thickening in the left ventricle wall and septum, query hypertrophic cardiomyopathy. Left ventricular free wall thickness is 1.7 cm. There are patchy 3-vessel coronary calcifications. Tortuosity in the great vessels and aorta with moderate aortic atherosclerosis without aneurysm or dissection. There is a patulous upper thoracic esophagus above the carina level with fluid filling. Consider aspiration precautions unless already being done. . No intrathoracic adenopathy is seen. LUNGS AND PLEURA: The trachea and central airways are small in caliber which could be due to active respiration or bronchospasm. There is diffuse bronchial thickening right middle and both lower lobes. Symmetric small  layering bilateral pleural effusions with adjacent consolidation or atelectasis in the lower lobes. Mild bilateral apical scarring change noted. The lungs are otherwise clear with evidence of mild volume loss of the right hemithorax and multiple chronic healed right rib cage fracture deformities deforming the right chest wall. There is no overt pulmonary edema. No pneumothorax. SOFT TISSUES AND BONES: There are bilateral breast implants with thick capsular calcifications. Axillary spaces are clear. There is mild to moderate thoracic kyphodextroscoliosis with osteopenia and degenerative changes of the spine. There are additional healed fractures of the left anterolateral ribs. Multiple chronic healed right rib cage fracture deformities deforming the right chest wall. ABDOMEN AND PELVIS: LIVER: The liver is unremarkable. GALLBLADDER AND BILE DUCTS: Gallbladder is unremarkable. No biliary ductal dilatation. SPLEEN: Spleen demonstrates no acute abnormality. PANCREAS: Pancreas demonstrates no acute abnormality. ADRENAL GLANDS: Adrenal glands demonstrate no acute abnormality. There  is no adrenal mass. KIDNEYS, URETERS AND BLADDER: No renal mass. Scattered left renal scarring. 4 mm calyceal stone mid pole left kidney noted. No other nephrolithiasis. No obstructing stones. Bilateral generalized renal cortical thinning is seen. No hydronephrosis. No perinephric or periureteral stranding. The bladder demonstrates asymmetric thickening and mucosal enhancement to the right warranting urologic referral and consideration of cystoscopy. Cystitis or neoplasm could have this appearance. GI AND BOWEL: The stomach and small bowel are unremarkable. An appendix is not seen. There is a mobile cecum protruding into a new midline infraumbilical wide mouth hernia, but no evidence of hernia strangulation or bowel obstruction. There is advanced diverticulosis in the distal descending and sigmoid colon. There is faint stranding adjacent to the  distal descending sigmoid concerning for mild acute diverticulitis and not seen previously. There is moderate retained stool in the ascending colon. No abnormal bowel wall thickening or distension. REPRODUCTIVE: The uterus and right ovary are unremarkable. There is a stable 2.2 cm simple appearing left ovarian cyst, hounsfield density is 11.3. No follow-up imaging is recommended. There is spray artifact from a vaginal pessary. PERITONEUM AND RETROPERITONEUM: No  free air, free hemorrhage, or abscess. There is minimal perihepatic ascites. LYMPH NODES: No lymphadenopathy. BONES AND SOFT TISSUES: There is artifact from a left hip arthroplasty, which was seen previously. There is osteopenia, moderate to severe broad-based lumbar levoscoliosis, and degenerative change of the lumbar spine. No focal soft tissue abnormality. IMPRESSION: 1. No evidence of pulmonary embolism to the segmental level, acknowledging motion-limited evaluation. 2. Mild acute diverticulitis in the distal descending colon. No abscess or free air. 3. Asymmetric right-sided bladder wall thickening with mucosal enhancement, recommend urologic referral and consideration of cystoscopy given differential of cystitis versus neoplasm. 4. 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 5. Thyroid  nodule in the inferior left lobe measuring 1.6 cm, recommend nonemergent thyroid  ultrasound if clinically warranted given advanced age. 6. Small pleural effusions with adjacent consolidation or atelectasis in the lower lobes . 7. Small caliber central airways consistent with bronchospasm or active respiration. 8. Increasing thickening in the left ventricular wall and cardiac septum. Query hypertrophic cardiomyopathy. 9. Aortic and coronary artery atherosclerosis. 10. S-shaped thoracolumbar scoliosis, osteopenia, and degenerative changes . SABRA Electronically signed by: Francis Quam MD  05/07/2024 10:10 PM EST RP Workstation: [id number]   DG Chest Portable 1 View Result Date: 05/07/2024 CLINICAL DATA:  Sepsis workup. EXAM: PORTABLE CHEST 1 VIEW COMPARISON:  None Available. FINDINGS: The cardiac silhouette is mildly enlarged and unchanged in size. Multiple chronic right-sided rib fractures are seen with chronic right-sided volume loss also noted. There is marked severity calcification of the aortic arch. There is moderate severity left basilar scarring and/or atelectasis. Small bilateral pleural effusions are noted. No pneumothorax is identified. Multilevel degenerative changes are present throughout the thoracic spine. IMPRESSION: 1. Multiple chronic right-sided rib fractures with chronic right-sided volume loss. 2. Moderate severity left basilar scarring and/or atelectasis. 3. Small bilateral pleural effusions. Electronically Signed   By: Suzen Dials M.D.   On: 05/07/2024 20:33        Scheduled Meds:  sodium chloride    Intravenous Once   budesonide   9 mg Oral Daily   collagenase   Topical Daily   cyanocobalamin   1,000 mcg Subcutaneous Daily   Followed by   NOREEN ON 05/15/2024] cyanocobalamin   1,000 mcg Subcutaneous Q30 days   docusate sodium   100 mg Oral BID   donepezil   10 mg  Oral QHS   feeding supplement   Oral TID BM   ferrous sulfate  325 mg Oral Q breakfast   metoprolol tartrate  25 mg Oral BID   mirtazapine   7.5 mg Oral QHS   pantoprazole  40 mg Oral Daily   potassium chloride   40 mEq Oral Q6H   sucralfate  1 g Oral TID AC & HS   Continuous Infusions:  piperacillin-tazobactam (ZOSYN)  IV 3.375 g (05/09/24 1324)     LOS: 0 days       Brayton Lye, MD [facility]   To contact the attending provider between 7A-7P or the covering provider during after hours 7P-7A, please log into the web site [email or web address] and access using universal [place] password for that web site. If you do not have the password, please call the hospital  operator.  05/09/2024, 2:00 PM

## 2024-05-09 NOTE — TOC CM/SW Note (Signed)
 Transition of Care (TOC) CM/SW Note   Pt from [facility], long term care. Pt is oriented x1, hx of dementia per MD note. Note completed per chart review. CSW contacted [employer], confirmed from facility, pt able to return. Possible DC Monday, facility updated.   CSW will continue to assist with disposition.         Patient Goals and CMS Choice        Expected Discharge Plan and Services                                                Prior Living Arrangements/Services                       Activities of Daily Living      Permission Sought/Granted                  Emotional Assessment              Admission diagnosis:  Diverticulitis [K57.92] GIB (gastrointestinal bleeding) [K92.2] Anemia due to GI blood loss [D50.0] Patient Active Problem List   Diagnosis Date Noted   Iron deficiency anemia due to chronic blood loss 05/08/2024   Heme positive stool 05/08/2024   Diverticulitis 05/08/2024   Abnormal CT of the abdomen 05/08/2024   History of Crohn's disease 05/08/2024   GIB (gastrointestinal bleeding) 05/07/2024   Multiple rib fractures 01/18/2020   Risk for falls 01/20/2018   Weakness 12/04/2017   Elevated blood pressure reading 12/04/2017   Chronic diastolic CHF (congestive heart failure) (HCC) 12/04/2017   Malnutrition of moderate degree 12/04/2017   Chest wall pain 04/03/2017   Shingles 03/12/2017   Healthcare maintenance 12/18/2016   Advance care planning 12/18/2016   OAB (overactive bladder) 09/20/2015   Procidentia of uterus 02/15/2015   Vaginal vault prolapse 11/16/2014   Multiple pelvic fractures (HCC) 07/02/2013   Urinary incontinence 09/09/2011   Osteoporosis 10/17/2009   Primary hyperparathyroidism 10/02/2009   Vitamin D  deficiency 01/06/2009   CROHN'S DISEASE, SMALL INTESTINE 01/04/2009   Bladder prolapse, female, acquired 01/04/2009   PCP:  System, Provider Not In Pharmacy:   [id number] -  [address] ([address]), [place] - [address] [place] ([place]) [place] [address] Phone: [phone] Fax: [phone]  [facility] [place] [facility] [place], [place] - [address] [place] [address] Phone: [phone] Fax: [phone]     Social Determinants of Health (SDOH) Interventions    Readmission Risk Interventions     No data to display

## 2024-05-10 DIAGNOSIS — K5792 Diverticulitis of intestine, part unspecified, without perforation or abscess without bleeding: Secondary | ICD-10-CM | POA: Diagnosis not present

## 2024-05-10 DIAGNOSIS — A419 Sepsis, unspecified organism: Secondary | ICD-10-CM | POA: Diagnosis not present

## 2024-05-10 DIAGNOSIS — G9340 Encephalopathy, unspecified: Secondary | ICD-10-CM | POA: Diagnosis not present

## 2024-05-10 DIAGNOSIS — E43 Unspecified severe protein-calorie malnutrition: Secondary | ICD-10-CM | POA: Diagnosis not present

## 2024-05-10 DIAGNOSIS — Z515 Encounter for palliative care: Secondary | ICD-10-CM | POA: Diagnosis not present

## 2024-05-10 DIAGNOSIS — J690 Pneumonitis due to inhalation of food and vomit: Secondary | ICD-10-CM | POA: Diagnosis not present

## 2024-05-10 DIAGNOSIS — F0390 Unspecified dementia without behavioral disturbance: Secondary | ICD-10-CM | POA: Diagnosis not present

## 2024-05-10 DIAGNOSIS — N390 Urinary tract infection, site not specified: Secondary | ICD-10-CM | POA: Diagnosis not present

## 2024-05-10 LAB — BASIC METABOLIC PANEL WITH GFR
Anion gap: 8 (ref 5–15)
BUN: 19 mg/dL (ref 8–23)
CO2: 24 mmol/L (ref 22–32)
Calcium: 8.7 mg/dL — ABNORMAL LOW (ref 8.9–10.3)
Chloride: 111 mmol/L (ref 98–111)
Creatinine, Ser: 0.8 mg/dL (ref 0.44–1.00)
GFR, Estimated: >60 mL/min (ref >60)
Glucose, Bld: 92 mg/dL (ref 70–99)
Potassium: 4.4 mmol/L (ref 3.5–5.1)
Sodium: 143 mmol/L (ref 135–145)

## 2024-05-10 LAB — PHOSPHORUS: Phosphorus: 2.7 mg/dL (ref 2.5–4.6)

## 2024-05-10 LAB — CBC
HCT: 30.1 % — ABNORMAL LOW (ref 36.0–46.0)
Hemoglobin: 8.9 g/dL — ABNORMAL LOW (ref 12.0–15.0)
MCH: 24.1 pg — ABNORMAL LOW (ref 26.0–34.0)
MCHC: 29.6 g/dL — ABNORMAL LOW (ref 30.0–36.0)
MCV: 81.4 fL (ref 80.0–100.0)
Platelets: 339 K/uL (ref 150–400)
RBC: 3.7 MIL/uL — ABNORMAL LOW (ref 3.87–5.11)
RDW: 15.9 % — ABNORMAL HIGH (ref 11.5–15.5)
WBC: 8.3 K/uL (ref 4.0–10.5)
nRBC: 0 % (ref 0.0–0.2)

## 2024-05-10 LAB — GLUCOSE, CAPILLARY
Glucose-Capillary: 72 mg/dL (ref 70–99)
Glucose-Capillary: 87 mg/dL (ref 70–99)
Glucose-Capillary: 103 mg/dL — ABNORMAL HIGH (ref 70–99)

## 2024-05-10 LAB — BRAIN NATRIURETIC PEPTIDE: B Natriuretic Peptide: 514.2 pg/mL — ABNORMAL HIGH (ref 0.0–100.0)

## 2024-05-10 LAB — PROCALCITONIN: Procalcitonin: 0.32 ng/mL

## 2024-05-10 LAB — MAGNESIUM: Magnesium: 2.1 mg/dL (ref 1.7–2.4)

## 2024-05-10 MED ORDER — THIAMINE MONONITRATE 100 MG PO TABS
100.0000 mg | ORAL_TABLET | Freq: Every day | ORAL | Status: AC
  Administered 2024-05-12 – 2024-05-15 (×4): 100 mg via ORAL
  Filled 2024-05-10 (×4): qty 1

## 2024-05-10 MED ORDER — ADULT MULTIVITAMIN W/MINERALS CH
1.0000 | ORAL_TABLET | Freq: Every day | ORAL | Status: DC
  Administered 2024-05-12 – 2024-05-16 (×5): 1 via ORAL
  Filled 2024-05-10 (×5): qty 1

## 2024-05-10 MED ORDER — MORPHINE SULFATE (PF) 2 MG/ML IV SOLN
1.0000 mg | INTRAVENOUS | Status: DC | PRN
  Administered 2024-05-10 – 2024-05-11 (×2): 1 mg via INTRAVENOUS
  Filled 2024-05-10: qty 1

## 2024-05-10 MED ORDER — MORPHINE SULFATE (PF) 2 MG/ML IV SOLN
1.0000 mg | INTRAVENOUS | Status: DC | PRN
  Filled 2024-05-10: qty 1

## 2024-05-10 MED ORDER — IPRATROPIUM-ALBUTEROL 0.5-2.5 (3) MG/3ML IN SOLN
3.0000 mL | RESPIRATORY_TRACT | Status: DC
  Administered 2024-05-10 – 2024-05-11 (×5): 3 mL via RESPIRATORY_TRACT
  Filled 2024-05-10 (×5): qty 3

## 2024-05-10 MED ORDER — GLYCOPYRROLATE 0.2 MG/ML IJ SOLN
0.1000 mg | Freq: Four times a day (QID) | INTRAMUSCULAR | Status: DC
  Administered 2024-05-10 – 2024-05-16 (×24): 0.1 mg via INTRAVENOUS
  Filled 2024-05-10 (×26): qty 1

## 2024-05-10 MED ORDER — LACTATED RINGERS IV SOLN: INTRAVENOUS | Status: AC

## 2024-05-10 NOTE — Progress Notes (Signed)
 Initial Nutrition Assessment  DOCUMENTATION CODES:   Severe malnutrition in context of chronic illness  INTERVENTION:  Continue liberalized diet Feeding assistance w/ meals Add Mighty Shake TID between meals, each supplement provides 330 kcals and 9 grams of protein Discontinue Ensure Plus High Protein po BID, each supplement provides 350 kcal and 20 grams of protein   Continue B12 supplement Add MVI w/ minerals  Add Magic cup TID with meals, each supplement provides 290 kcal and 9 grams of protein  Monitor magnesium, potassium, and phosphorus daily for at least 3 days, MD to replete as needed Add Thiamine  100 mg daily for 5 days   NUTRITION DIAGNOSIS:  Severe Malnutrition related to chronic illness as evidenced by severe muscle depletion, severe fat depletion.  GOAL:  Patient will meet greater than or equal to 90% of their needs  MONITOR:  PO intake, Supplement acceptance  REASON FOR ASSESSMENT:  Consult Assessment of nutrition requirement/status  ASSESSMENT:   Pt with PMH significant for: dementia, CHF, Chron's disease, diverticulosis, uterovaginal prolapse, hemorrhoids, hyperparathyroidism s/p parathyroidectomy, HTN, HLD, osteoporosis, vitamin D  deficiency, CKD III, protein calorie malnutrition. Presented via EMS from SNF d/t AMS and fever and found to have sepsis and GI bleed.  Lower extremity contractures at baseline. Has required one unit PRBC transfusion. On B12 supplement. Nectar thickened liquids at baseline.  Average Meal Intake No documented intake to review  Met with patient at bedside who is pleasantly confused. Continues on regular diet, per speech, however downgraded to nectar thick today as son reports this is her baseline. Additional work up was pending, but now not indicated.   Unable to obtain nutrition hx at bedside as no family present and patient is unreliable historian. RN reports requiring feeding assistance and with poor intake. Noted on appetite  stimulant.  Admit/Current Weight: 49 kg  No significant edema on exam. Unable to state UBW. No significant or recent weight hx to assess weight trend. Will monitor while inpatient.   Meds: cyanocobalamin , docusate sodium , ferrous sulfate , mirtazapine , pantoprazole , sucralfate , IV ABX   Has required potassium repletion. Monitoring electrolytes as she is at risk for refeeding given inability to confirm intake PTA and presence of malnutrition. Dehydrated on admission. Kidney function improving s/p fluid resuscitation. Will add thiamine .   Labs: Na+ 143 (wdl) K+ 4.4 (wdl) PHOS 2.6 (wdl) Mg 2.1 (wdl) CBGs 73-92 x24 hours   NUTRITION - FOCUSED PHYSICAL EXAM: While she is not ambulatory at baseline and her lower extremities are contracted, would expect severe muscle depletions in this area expected. Significant depletions noted in other areas suggesting inadequate energy intake for some time. While this is partially related to her chronic comorbid conditions, also likely r/t advanced age as social/environmental etiology at play r/t prolonged inadequate energy intake.  Flowsheet Row Most Recent Value  Orbital Region Moderate depletion  Upper Arm Region Severe depletion  Thoracic and Lumbar Region Severe depletion  Buccal Region Mild depletion  Temple Region Moderate depletion  Clavicle Bone Region Severe depletion  Clavicle and Acromion Bone Region Severe depletion  Scapular Bone Region Severe depletion  Dorsal Hand Moderate depletion  Patellar Region Severe depletion  Anterior Thigh Region Severe depletion  Posterior Calf Region Severe depletion  Edema (RD Assessment) None  Hair Reviewed  Eyes Reviewed  Mouth Reviewed  Skin Reviewed  Nails Reviewed    Diet Order:   Diet Order             Diet NPO time specified  Diet effective now  EDUCATION NEEDS:   Not appropriate for education at this time  Skin:  Skin Assessment: Skin Integrity Issues: Skin  Integrity Issues:: Stage III, DTI DTI: lower sacrum Stage III: vertebral column  Last BM:  PTA  Height:  Ht Readings from Last 1 Encounters:  05/08/24 5' 5 (1.651 m)    Weight:  Wt Readings from Last 1 Encounters:  05/08/24 49 kg    Ideal Body Weight:  56.8 kg  BMI:  Body mass index is 17.98 kg/m.  Estimated Nutritional Needs:   Kcal:  1400-1600 kcals  Protein:  60-75g  Fluid:  >1.4L/day  Blair Deaner MS, RD, LDN Registered Dietitian Clinical Nutrition RD Inpatient Contact Info in Amion

## 2024-05-10 NOTE — NC FL2 (Signed)
 [place]  MEDICAID FL2 LEVEL OF CARE FORM     IDENTIFICATION  Patient Name: [name] Birthdate: 1933/02/15 Sex: female Admission Date (Current Location): 05/07/2024  [facility] and [place] Number:  Producer, Television/film/video and Address:  [facility] [place], [address] [place], [place] [address]      Provider Number: [id number]  Attending Physician Name and Address:  Elgergawy, Brayton RAMAN, MD  Relative Name and Phone Number:  Dr. Lael Ao, son [phone])    Current Level of Care: Hospital Recommended Level of Care: Skilled Nursing Facility Prior Approval Number:    Date Approved/Denied:   PASRR Number: [id number]  Discharge Plan: SNF    Current Diagnoses: Patient Active Problem List   Diagnosis Date Noted   Iron deficiency anemia due to chronic blood loss 05/08/2024   Heme positive stool 05/08/2024   Diverticulitis 05/08/2024   Abnormal CT of the abdomen 05/08/2024   History of Crohn's disease 05/08/2024   GIB (gastrointestinal bleeding) 05/07/2024   Multiple rib fractures 01/18/2020   Risk for falls 01/20/2018   Weakness 12/04/2017   Elevated blood pressure reading 12/04/2017   Chronic diastolic CHF (congestive heart failure) (HCC) 12/04/2017   Malnutrition of moderate degree 12/04/2017   Chest wall pain 04/03/2017   Shingles 03/12/2017   Healthcare maintenance 12/18/2016   Advance care planning 12/18/2016   OAB (overactive bladder) 09/20/2015   Procidentia of uterus 02/15/2015   Vaginal vault prolapse 11/16/2014   Multiple pelvic fractures (HCC) 07/02/2013   Urinary incontinence 09/09/2011   Osteoporosis 10/17/2009   Primary hyperparathyroidism 10/02/2009   Vitamin D  deficiency 01/06/2009   CROHN'S DISEASE, SMALL INTESTINE 01/04/2009   Bladder prolapse, female, acquired 01/04/2009    Orientation RESPIRATION BLADDER Height & Weight     Self  Normal External catheter Weight: 108 lb 0.4 oz (49 kg) Height:  5' 5 (165.1 cm)   BEHAVIORAL SYMPTOMS/MOOD NEUROLOGICAL BOWEL NUTRITION STATUS        Diet (See d/c summary)  AMBULATORY STATUS COMMUNICATION OF NEEDS Skin   Extensive Assist Verbally PU Stage and Appropriate Care (PU Sacrum; PU Stg 3 Vertabal Column)                       Personal Care Assistance Level of Assistance  Bathing, Feeding, Dressing Bathing Assistance: Maximum assistance Feeding assistance: Limited assistance Dressing Assistance: Maximum assistance     Functional Limitations Info             SPECIAL CARE FACTORS FREQUENCY                       Contractures      Additional Factors Info  Code Status, Allergies Code Status Info: Full Allergies Info: No Active Allergies           Current Medications (05/10/2024):  This is the current hospital active medication list Current Facility-Administered Medications  Medication Dose Route Frequency Provider Last Rate Last Admin   0.9 %  sodium chloride  infusion (Manually program via Guardrails IV Fluids)   Intravenous Once Lemly, Tatum N, MD       albuterol  (PROVENTIL ) (2.5 MG/3ML) 0.083% nebulizer solution 2.5 mg  2.5 mg Nebulization Q4H PRN Elgergawy, Dawood S, MD       budesonide  (ENTOCORT EC ) 24 hr capsule 9 mg  9 mg Oral Daily Lynn, Catherine M, MD   9 mg at 05/10/24 9095   collagenase  (SANTYL ) ointment   Topical Daily Elgergawy,  Brayton RAMAN, MD   Given at 05/10/24 9095   cyanocobalamin  (VITAMIN B12) injection 1,000 mcg  1,000 mcg Subcutaneous Daily Elgergawy, Dawood S, MD   1,000 mcg at 05/10/24 0904   Followed by   NOREEN ON 05/15/2024] cyanocobalamin  (VITAMIN B12) injection 1,000 mcg  1,000 mcg Subcutaneous Q30 days Elgergawy, Brayton RAMAN, MD       docusate sodium  (COLACE) capsule 100 mg  100 mg Oral BID Macario Dorothyann HERO, MD   100 mg at 05/10/24 9094   donepezil  (ARICEPT ) tablet 10 mg  10 mg Oral QHS Macario Dorothyann HERO, MD   10 mg at 05/09/24 2126   feeding supplement (ENSURE PLUS HIGH PROTEIN) liquid   Oral TID BM Macario Dorothyann HERO, MD   237 mL at 05/09/24 1940   ferrous sulfate tablet 325 mg  325 mg Oral Q breakfast Macario Dorothyann HERO, MD   325 mg at 05/10/24 9096   guaiFENesin-dextromethorphan (ROBITUSSIN DM) 100-10 MG/5ML syrup 10 mL  10 mL Oral Q4H PRN Elgergawy, Dawood S, MD   10 mL at 05/10/24 0034   metoprolol tartrate (LOPRESSOR) tablet 25 mg  25 mg Oral BID Elgergawy, Dawood S, MD   25 mg at 05/10/24 9096   mirtazapine  (REMERON ) tablet 7.5 mg  7.5 mg Oral QHS Macario Dorothyann HERO, MD   7.5 mg at 05/09/24 2127   pantoprazole (PROTONIX) EC tablet 40 mg  40 mg Oral Daily Macario Dorothyann HERO, MD   40 mg at 05/10/24 0903   piperacillin-tazobactam (ZOSYN) IVPB 3.375 g  3.375 g Intravenous Q8H Elgergawy, Dawood S, MD 12.5 mL/hr at 05/10/24 0510 3.375 g at 05/10/24 0510   sucralfate (CARAFATE) tablet 1 g  1 g Oral TID AC & HS Macario Dorothyann HERO, MD   1 g at 05/10/24 9095     Discharge Medications: Please see discharge summary for a list of discharge medications.  Relevant Imaging Results:  Relevant Lab Results:   Additional Information SSN# [id number]  Bernardino Dean, [address]

## 2024-05-10 NOTE — Progress Notes (Addendum)
 [facility] Progress Note  CC:  Anemia, rectal bleed   Subjective:  Son is at bedside.  Patient is on her side in bed, difficulty breathing.    Objective:  Vital signs in last 24 hours: Temp:  [97.7 F (36.5 C)-98.8 F (37.1 C)] 98 F (36.7 C) (12/01 1155) Pulse Rate:  [70-91] 85 (12/01 1408) Resp:  [20-24] 20 (12/01 1408) BP: (130-170)/(75-106) 144/75 (12/01 1408) SpO2:  [92 %-96 %] 92 % (12/01 1408)   General:  Alert, frail.   Heart:  Regular rate and rhythm; no murmurs Pulm:  Coarse breath sounds noted. Extremities:  Some B/L LE edema noted.  Intake/Output from previous day: 11/30 0701 - 12/01 0700 In: 134.4 [IV Piggyback:134.4] Out: 350 [Urine:350] Intake/Output this shift: Total I/O In: -  Out: 550 [Urine:550]  Lab Results: Recent Labs    05/08/24 1600 05/09/24 0347 05/10/24 0320  WBC 10.0 8.2 8.3  HGB 8.6* 8.9* 8.9*  HCT 28.3* 29.7* 30.1*  PLT 271 297 339   BMET Recent Labs    05/08/24 0611 05/09/24 0347 05/10/24 0320  NA 145 142 143  K 3.7 3.2* 4.4  CL 114* 109 111  CO2 26 21* 24  GLUCOSE 93 73 92  BUN 28* 25* 19  CREATININE 0.84 0.78 0.80  CALCIUM  8.1* 8.5* 8.7*   LFT Recent Labs    05/08/24 0611  PROT 5.0*  ALBUMIN 2.2*  AST 16  ALT 13  ALKPHOS 53  BILITOT 0.6   DG Chest Port 1 View Result Date: 05/09/2024 EXAM: 1 VIEW(S) XRAY OF THE CHEST 05/09/2024 06:52:00 PM COMPARISON: 05/07/2024 CLINICAL HISTORY: Cough FINDINGS: LUNGS AND PLEURA: Chronic volume loss in right hemithorax. Small right and moderate left pleural effusions, increased. Bibasilar opacities, slightly progressed from prior. No pneumothorax. HEART AND MEDIASTINUM: Aortic atherosclerosis. No acute abnormality of the cardiac and mediastinal silhouettes. BONES AND SOFT TISSUES: Chronic right rib fractures. IMPRESSION: 1. Small right and moderate left pleural effusions, increased. 2. Bibasilar opacities, slightly progressed from prior, which may reflect  atelectasis and/or pneumonia. Electronically signed by: Norman Gatlin MD 05/09/2024 07:10 PM EST RP Workstation: [id number]    Assessment / Plan: 89 year old female admitted with acute respiratory failure, fever, sepsis and altered mental status.  Elevated lactic acid level.  Chest x-ray showed moderate left basilar scarring/atelectasis and small bilateral pleural effusions. CTA was negative for PE. Possible PNA, now suspect that she is aspirating. On Zosyn and Vancomycin IV. - Management per the hospitalist   Mild acute diverticulitis in the descending colon without abscess or perforation per CTAP.  IV antibiotics. - IV fluids and pain management per the hospitalist   Bright red blood per the rectum. Rectal exam today showed a large amount of soft brown stool, heme positive.  No bright red blood. - No plans for endoscopic evaluation at this time - Will need to establish goals of care during this hospitalization.  Family is considering comfort care.   Iron deficiency anemia, likely acute on chronic. Hg 7.0 -> transfused one unit of PRBCs -> Hg 7.8 -> today 8.9 grams and stable after one unit PRBCs on 11/29.  On oral iron. FOBT +.  - Transfuse for hemoglobin level less than 7 - Continue Pantoprazole 40 mg daily and sucralfate 1 g 4 times daily for now   History of Crohn's disease on chronic Budesonide .  Colonoscopy 12/2008 showed chronic active ileitis and moderate diverticulosis in the sigmoid colon. - Continue Budesonide  9 mg daily for now as  she takes it chronically, may need to eventually wean off   Severe malnutrition - Recommend RD consult    CTAP showed possible urinary bladder cystitis.  Urinalysis pending. - Management per the hospitalist   Right forearm skin tear and sacral decubitus - Management per the medical service - Recommend wound care consult   History of chronic diastolic CHF.  LVEF greater than 65% per echo 08/2018.  -Patient's breathing is worse today and she is  hallucinating, suspect that she is aspirating so she is NPO.  Son is at bedside, considering comfort care.  Palliative has been consulted.  GI will sign off as no plans for endoscopic evaluation at this time.     LOS: 0 days   Harlene BIRCH. Zehr  05/10/2024, 3:55 PM   Attending physician's note   I personally saw the patient and performed a substantive portion of the medical decision making process for this encounter (including a complete performance of the key components : MDM, Hx and Exam), in conjunction with the APP.  I agree with the APP's note, impression, and  the management plan for the number and complexity of problems addressed at the encounter for the patient and take responsibility for that plan with its inherent risk of complications, morbidity, or mortality with additional input as follows.      Family at bedside, discussed with her son and 2 daughters.  They prefer to hold off any aggressive interventions, are planning to move towards comfort measures. Can try sips of fluids as tolerated, patient complaining of dry mouth  No plan for endoscopic procedures at this point GI signing off, available if have any questions  I have spent >35 minutes of patient care (this includes precharting, chart review, review of results, face-to-face time used for counseling as well as treatment plan and follow-up. The patient's family was provided an opportunity to ask questions and all were answered. They agreed with the plan and demonstrated an understanding of the instructions.   LOIS Wilkie Mcgee , MD [phone]

## 2024-05-10 NOTE — Plan of Care (Signed)

## 2024-05-10 NOTE — Progress Notes (Addendum)
 PROGRESS NOTE    [name]  FMW:[id number] DOB: 1932-12-29 DOA: 05/07/2024 PCP: System, Provider Not In   Chief Complaint  Patient presents with   Respiratory Distress    Brief Narrative:    [name] is a 89 y.o. female with medical history significant for CHF, Crohn's disease, diverticulosis, uterovaginal prolapse with pessary, hemorrhoids, hyperparathyroidism s/p prior parathyroidectomy, HTN, HLD, osteoporosis, vitamin D  deficiency, CKD 3, protein calorie malnutrition.  Patient brought into ED by EMS from nursing home for increasing confusion and fever.  Upon patient arrival, she was tachypneic with SpO2 in low 90s.  Was started on CPAP, but then weaned quickly to 3 L O2 via [place].  Transfused 1 unit of blood given hemoglobin of 7 and positive occult blood in stool sample.  CT chest/abdomen/pelvis, ruled out PE, but was significant for acute diverticulitis, and UA was significantly positive for UA, and by GI, her hemoglobin has stabilized, on admission with bilateral opacity, per son patient had aspiration pneumonia or she was treated with antibiotic at her facility, patient initially has improved, she was noted to have significant cough with concern for aspiration, she was evaluated by SLP, she is in significant respiratory distress this morning.  Assessment & Plan:   Principal Problem:   GIB (gastrointestinal bleeding) Active Problems:   Iron deficiency anemia due to chronic blood loss   Heme positive stool   Diverticulitis   Abnormal CT of the abdomen   History of Crohn's disease  Sepsis, POA -Multifactorial, evidence of acute diverticulitis, UTI and aspiration pneumonia - CT chest/abdomen/pelvis significant for diverticulitis.  Cystitis, and bibasilar small pleural effusion with adjacent consolidation. - Continue with IV Zosyn .  Respiratory distress Aspiration pneumonia -patient with increased work of breathing, even though no hypoxia, but she is tachypneic with  significant use of accessory muscles - X-ray significant for worsening bilateral opacity, atelectasis versus aspiration - She is with known risk of aspiration recently treated with aspiration of pneumonia at her facility. - Continue with IV Zosyn . - Has increased work of breathing, does look she keep aspirating, as well with poor appetite, so we will keep n.p.o. for now, will start on scheduled IV Robinul  . - Will request NTS . - Will start on IV fluids as she is NPO. - Will keep on IV Zosyn  as concerned on aspiration with p.o. meds   Acute diverticulitis - Continue with IV Zosyn .  Right red blood per rectum Occult positive stool Iron deficiency anemia -Received 1 unit PRBC overnight -Continue with PPI and Carafate  -Management per GI - Recent anemia workup significant for low ferritin and iron level, continue with p.o. iron, hold on IV iron due to sepsis on presentation - B12 borderline, will start on supplements will continue with IM given known history of chron's disease and poor GI absorption - Hemoglobin remained stable  Acute metabolic encephalopathy Underlying significant dementia - Worsening mental status in the setting of sepsis, dehydration - With underlying advanced dementia, high risk for delirium - Start on B12 supplements  History of chron's disease -On chronic budesonide  -Continue with budesonide  per GI  Severe protein calorie malnutrition -RD consult pending  Abnormal findings in urinary bladder on imaging (cystitis versus neoplasm) - Most likely due to cystitis - Outpatient referral to urology, she is already having urologist she is following with them [facility]  Right forearm skin tear -Wound care consulted  Chronic diastolic CHF - Presents with volume overload  Hypertension - Pressures elevated, as well she has mild tachycardia,  so we will start on low-dose metoprolol - Telemetry [place] events significant for atrial arrhythmias, but no evidence of A-fib,  as few readings on telemetry monitor reads A-fib)  Hypernatremia -Due to volume depletion/dehydration, resolved with hydration  Hypokalemia - Mains low this morning, replaced  Dementia - Continue with supportive care - Continue with home Aricept   Goals of care discussion - I have discussed with son at bedside, confirmed DNR/DNI CODE STATUS, no heroics, for now continue with current measures including antibiotics, breathing treatments, aspiration precaution, suction and IV Robinul  as needed, continues to decline or no improvement, then would consider hospice  DVT prophylaxis: ( SCD) Code Status: (DNR, no heroics Family Communication: (Discussed with son at bedside 11/29, 12/1, left voicemail for both daughters 12/1). Disposition:   Currently increased work of breathing, with aspiration pneumonia, altered, n.p.o. still in need of IV antibiotics   Consultants:  Gastroenterology     Subjective:  Increased work of breathing this morning, tachypneic, she is more somnolent  Objective: Vitals:   05/09/24 1936 05/10/24 0421 05/10/24 0827 05/10/24 1155  BP: (!) 152/84 (!) 148/89 (!) 160/79 (!) 170/98  Pulse:   75 70  Resp: (!) 22 (!) 22 20 20   Temp: 98.8 F (37.1 C) 98.1 F (36.7 C) 97.7 F (36.5 C) 98 F (36.7 C)  TempSrc: Oral Oral Oral Oral  SpO2:   96% 96%  Weight:      Height:        Intake/Output Summary (Last 24 hours) at 05/10/2024 1321 Last data filed at 05/10/2024 1023 Gross per 24 hour  Intake 58.09 ml  Output 900 ml  Net -841.91 ml   Filed Weights   05/08/24 0019  Weight: 49 kg    Examination:  More lethargic, but wakes up, open her eyes, but she is demented at baseline, and respiratory distress  Diminished air entry at the bases, tachypneic with use of accessory muscles  Tachycardic, but regular  Abdomen soft  Extremities with no edema, but contracted .   Data Reviewed: I have personally reviewed following labs and imaging studies  CBC: Recent  Labs  Lab 05/07/24 1920 05/07/24 1931 05/08/24 0611 05/08/24 1600 05/09/24 0347 05/10/24 0320  WBC 11.3*  --  9.4 10.0 8.2 8.3  NEUTROABS 9.7*  --   --   --   --   --   HGB 7.0* 7.8*  7.8* 7.7* 8.6* 8.9* 8.9*  HCT 24.8* 23.0*  23.0* 25.6* 28.3* 29.7* 30.1*  MCV 84.4  --  81.8 81.6 81.6 81.4  PLT 320  --  252 271 297 339    Basic Metabolic Panel: Recent Labs  Lab 05/07/24 1920 05/07/24 1931 05/08/24 0611 05/09/24 0347 05/10/24 0320  NA 143 147*  147* 145 142 143  K 3.0* 3.0*  3.0* 3.7 3.2* 4.4  CL 113* 112* 114* 109 111  CO2 20*  --  26 21* 24  GLUCOSE 178* 171* 93 73 92  BUN 34* 32* 28* 25* 19  CREATININE 1.02* 1.10* 0.84 0.78 0.80  CALCIUM  8.0*  --  8.1* 8.5* 8.7*  MG 2.1  --  2.1 2.2 2.1  PHOS  --   --  2.6 2.6 2.7    GFR: Estimated Creatinine Clearance: 37.6 mL/min (by C-G formula based on SCr of 0.8 mg/dL).  Liver Function Tests: Recent Labs  Lab 05/07/24 1920 05/08/24 0611  AST 19 16  ALT 13 13  ALKPHOS 71 53  BILITOT 0.4 0.6  PROT 5.5* 5.0*  ALBUMIN 2.4* 2.2*    CBG: Recent Labs  Lab 05/07/24 1937 05/10/24 0835  GLUCAP 178* 72     Recent Results (from the past 240 hours)  Resp panel by RT-PCR (RSV, Flu A&B, Covid) Anterior Nasal Swab     Status: None   Collection Time: 05/07/24  7:06 PM   Specimen: Anterior Nasal Swab  Result Value Ref Range Status   SARS Coronavirus 2 by RT PCR NEGATIVE NEGATIVE Final   Influenza A by PCR NEGATIVE NEGATIVE Final   Influenza B by PCR NEGATIVE NEGATIVE Final    Comment: (NOTE) The Xpert Xpress SARS-CoV-2/FLU/RSV plus assay is intended as an aid in the diagnosis of influenza from Nasopharyngeal swab specimens and should not be used as a sole basis for treatment. Nasal washings and aspirates are unacceptable for Xpert Xpress SARS-CoV-2/FLU/RSV testing.  Fact Sheet for Patients: bloggercourse.com  Fact Sheet for Healthcare  Providers: seriousbroker.it  This test is not yet approved or cleared by the [place]  FDA and has been authorized for detection and/or diagnosis of SARS-CoV-2 by FDA under an Emergency Use Authorization (EUA). This EUA will remain in effect (meaning this test can be used) for the duration of the COVID-19 declaration under Section 564(b)(1) of the Act, 21 U.S.C. section 360bbb-3(b)(1), unless the authorization is terminated or revoked.     Resp Syncytial Virus by PCR NEGATIVE NEGATIVE Final    Comment: (NOTE) Fact Sheet for Patients: bloggercourse.com  Fact Sheet for Healthcare Providers: seriousbroker.it  This test is not yet approved or cleared by the [place]  FDA and has been authorized for detection and/or diagnosis of SARS-CoV-2 by FDA under an Emergency Use Authorization (EUA). This EUA will remain in effect (meaning this test can be used) for the duration of the COVID-19 declaration under Section 564(b)(1) of the Act, 21 U.S.C. section 360bbb-3(b)(1), unless the authorization is terminated or revoked.  Performed at [facility], [address], [place], [place] [address]   Blood culture (routine x 2)     Status: None (Preliminary result)   Collection Time: 05/07/24  7:20 PM   Specimen: BLOOD LEFT FOREARM  Result Value Ref Range Status   Specimen Description BLOOD LEFT FOREARM  Final   Special Requests   Final    BOTTLES DRAWN AEROBIC AND ANAEROBIC Blood Culture adequate volume   Culture   Final    NO GROWTH 3 DAYS Performed at [facility], [address], [place], [place] [address]    Report Status PENDING  Incomplete  Blood culture (routine x 2)     Status: None (Preliminary result)   Collection Time: 05/08/24  6:11 AM   Specimen: BLOOD RIGHT ARM  Result Value Ref Range Status   Specimen Description BLOOD RIGHT ARM  Final   Special Requests   Final    BOTTLES DRAWN  AEROBIC AND ANAEROBIC Blood Culture adequate volume   Culture   Final    NO GROWTH 2 DAYS Performed at [facility], [address] [place], [place] [address]    Report Status PENDING  Incomplete  Urine Culture (for pregnant, neutropenic or urologic patients or patients with an indwelling urinary catheter)     Status: None   Collection Time: 05/08/24  1:23 PM   Specimen: Urine, Clean Catch  Result Value Ref Range Status   Specimen Description URINE, CLEAN CATCH  Final   Special Requests NONE  Final   Culture   Final    NO GROWTH  Performed at [facility], [address] [place], [place] [address]    Report Status 05/09/2024 FINAL  Final         Radiology Studies: DG Chest Port 1 View Result Date: 05/09/2024 EXAM: 1 VIEW(S) XRAY OF THE CHEST 05/09/2024 06:52:00 PM COMPARISON: 05/07/2024 CLINICAL HISTORY: Cough FINDINGS: LUNGS AND PLEURA: Chronic volume loss in right hemithorax. Small right and moderate left pleural effusions, increased. Bibasilar opacities, slightly progressed from prior. No pneumothorax. HEART AND MEDIASTINUM: Aortic atherosclerosis. No acute abnormality of the cardiac and mediastinal silhouettes. BONES AND SOFT TISSUES: Chronic right rib fractures. IMPRESSION: 1. Small right and moderate left pleural effusions, increased. 2. Bibasilar opacities, slightly progressed from prior, which may reflect atelectasis and/or pneumonia. Electronically signed by: Norman Gatlin MD 05/09/2024 07:10 PM EST RP Workstation: [id number]        Scheduled Meds:  budesonide   9 mg Oral Daily   collagenase   Topical Daily   cyanocobalamin   1,000 mcg Subcutaneous Daily   Followed by   NOREEN ON 05/15/2024] cyanocobalamin   1,000 mcg Subcutaneous Q30 days   docusate sodium   100 mg Oral BID   donepezil   10 mg Oral QHS   feeding supplement   Oral TID BM   ferrous sulfate  325 mg Oral Q breakfast   glycopyrrolate   0.1 mg Intravenous QID   ipratropium-albuterol  3 mL  Nebulization Q4H   metoprolol tartrate  25 mg Oral BID   mirtazapine   7.5 mg Oral QHS   pantoprazole  40 mg Oral Daily   sucralfate  1 g Oral TID AC & HS   Continuous Infusions:  piperacillin-tazobactam (ZOSYN)  IV 3.375 g (05/10/24 0510)     LOS: 0 days       Brayton Lye, MD [facility]   To contact the attending provider between 7A-7P or the covering provider during after hours 7P-7A, please log into the web site [email or web address] and access using universal [place] password for that web site. If you do not have the password, please call the hospital operator.  05/10/2024, 1:21 PM

## 2024-05-10 NOTE — Progress Notes (Signed)
 Pt NTS at this time with minimal white/clear secretions obtained.

## 2024-05-10 NOTE — Evaluation (Addendum)
 Clinical/Bedside Swallow Evaluation Patient Details  Name: [name] MRN: [id number] Date of Birth: 1932-08-22  Today's Date: 05/10/2024 Time: SLP Start Time (ACUTE ONLY): 0840 SLP Stop Time (ACUTE ONLY): 0857 SLP Time Calculation (min) (ACUTE ONLY): 17 min  Past Medical History:  Past Medical History:  Diagnosis Date   Acute diastolic (congestive) heart failure (HCC)    Bladder prolapse, female, acquired    pessary in   Chronic diastolic (congestive) heart failure (HCC)    Chronic pain syndrome    Complete uterine prolapse    Crohn's disease (HCC) 2010   Diverticulosis    E. coli UTI 06/09/2013   Fall 05/2013   Hemorrhoids    History of hypercalcemia    History of hyperglycemia    Hydronephrosis    Hyperparathyroidism    Hypertension    Multiple pelvic fractures (HCC) 07/02/2013   Osteoporosis    Pubic ramus fracture (HCC)    Urinary incontinence 09/09/2011   Vitamin D  deficiency    Past Surgical History:  Past Surgical History:  Procedure Laterality Date   COLONOSCOPY  12/23/2008   crohn's, diverticulosis, internal hemorrhoids   HIP FRACTURE SURGERY Left    left, prosthesis   LAPAROSCOPY  10/2009   PARATHYROIDECTOMY Left 08/30/2013   Procedure: LEFT INFERIOR PARATHYROIDECTOMY;  Surgeon: Krystal CHRISTELLA Spinner, MD;  Location: [facility];  Service: General;  Laterality: Left;   RHINOPLASTY     TONSILLECTOMY     HPI:  [name] is a 89 y.o. female admitted from SNF confusion and fever. Found to have GI bleed, sepsis, acute metabolic encephalopathy. CT chest small pleural effusions with adjacent consolidation or atelectasis in the lower lobes. There is a patulous upper thoracic esophagus above the carina level with fluid filling. CXR 11/30 bibasilar opacities, slightly progressed from prior, which may reflect atelectasis and/or pneumonia.  PMH: dementia, CHF, Crohn's disease, diverticulosis, hyperparathyroidism s/p prior parathyroidectomy, HTN, HLD, osteoporosis, vitamin D   deficiency, CKD 3, protein calorie malnutrition.    Assessment / Plan / Recommendation  Clinical Impression  Pt shows signs of pharyngeal dysphagia with delayed cough throughout assessment (one cough prior to po's) with suspected esophageal involvement as found to have patulous upper thoracic esophagus above the carina level with fluid filling. Cough mostly noted following liquids. Timely mastication with soids without residue. Pt's cognitive deficits increase risk as she verbalizes during oral manipulation and after swallow. An instrumental test would evaluate pharyngeal function. Pt not candidate for MBS due to significant lower extremity contractions. She may tolerate a FEES. Discussed with Dr. Sherlon who attempted to call son with SLP present and left a voicemail for return call to the MD who stated he would discuss with him further to pursue the FEES. Pt currently on soft diet however with adequate mastication upgraded to regular, continue thin and crush meds.  Discussed with RN  positioning as pt needs encouragement to sit upright. Will follow for safety with po's, reiterate strategies with staff and family's decision to pursue FEES.  ADDENDUM: Dr. Katina spoke with son who states his mom has been on thick liquids for several weeks at the SNF and no need for further studies. MD modified diet to nectar thick liquids, continue regular. ST will sign off at this time. Recommend pt be followed by ST at facility for plan of care.   SLP Visit Diagnosis: Dysphagia, unspecified (R13.10)    Aspiration Risk  Mild aspiration risk    Diet Recommendation Regular;Thin liquid    Medication Administration: Crushed  with puree Supervision: Staff to assist with self feeding Compensations: Slow rate;Small sips/bites;Minimize environmental distractions Postural Changes: Seated upright at 90 degrees    Other  Recommendations Oral Care Recommendations: Oral care BID     Assistance Recommended at  Discharge    Functional Status Assessment Patient has had a recent decline in their functional status and demonstrates the ability to make significant improvements in function in a reasonable and predictable amount of time.  Frequency and Duration min 2x/week  2 weeks       Prognosis Prognosis for improved oropharyngeal function: Fair      Swallow Study   General Date of Onset: 05/10/24 HPI: [name] is a 89 y.o. female admitted from SNF confusion and fever. Found to have GI bleed, sepsis, acute metabolic encephalopathy. CT chest small pleural effusions with adjacent consolidation or atelectasis in the lower lobes. There is a patulous upper thoracic esophagus above the carina level with fluid filling. CXR 11/30 bibasilar opacities, slightly progressed from prior, which may reflect atelectasis and/or pneumonia.  PMH: dementia, CHF, Crohn's disease, diverticulosis, hyperparathyroidism s/p prior parathyroidectomy, HTN, HLD, osteoporosis, vitamin D  deficiency, CKD 3, protein calorie malnutrition. Type of Study: Bedside Swallow Evaluation Previous Swallow Assessment:  (none) Diet Prior to this Study: Thin liquids (Level 0) (soft) Temperature Spikes Noted: No Respiratory Status: Room air History of Recent Intubation: No Behavior/Cognition: Alert;Cooperative;Pleasant mood;Confused;Requires cueing Oral Cavity Assessment: Dry Oral Care Completed by SLP: No Oral Cavity - Dentition: Adequate natural dentition Vision: Functional for self-feeding Self-Feeding Abilities: Needs assist Patient Positioning: Upright in bed Baseline Vocal Quality: Normal Volitional Cough:  (mod strong)    Oral/Motor/Sensory Function Overall Oral Motor/Sensory Function: Within functional limits   Ice Chips Ice chips: Not tested   Thin Liquid Thin Liquid: Impaired Presentation: Cup;Straw Oral Phase Impairments: Reduced labial seal Oral Phase Functional Implications:  (anterior spill) Pharyngeal  Phase  Impairments: Cough - Delayed;Multiple swallows    Nectar Thick Nectar Thick Liquid: Not tested   Honey Thick Honey Thick Liquid: Not tested   Puree Puree: Within functional limits   Solid     Solid: Within functional limits      Dustin Olam Bull 05/10/2024,9:42 AM

## 2024-05-10 NOTE — Progress Notes (Signed)
 I have discussed with both son, and daughter, our main goal at this point is her comfort, they do understand poor prognosis given her recurrent aspiration, so we will add as needed morphine  for dyspnea and increased work of breathing, but for now we will continue with antibiotics, IV fluids, but certainly no escalation of care , palliative medicine consulted, Brayton Lye MD

## 2024-05-11 DIAGNOSIS — K922 Gastrointestinal hemorrhage, unspecified: Secondary | ICD-10-CM | POA: Diagnosis not present

## 2024-05-11 MED ORDER — MORPHINE SULFATE (PF) 2 MG/ML IV SOLN
1.0000 mg | INTRAVENOUS | Status: DC | PRN
  Administered 2024-05-12 – 2024-05-15 (×4): 1 mg via INTRAVENOUS
  Filled 2024-05-11 (×3): qty 1

## 2024-05-11 MED ORDER — DEXTROSE-SODIUM CHLORIDE 5-0.45 % IV SOLN: INTRAVENOUS | Status: AC

## 2024-05-11 NOTE — Plan of Care (Signed)
   Problem: Clinical Measurements: Goal: Ability to maintain clinical measurements within normal limits will improve Outcome: Progressing   Problem: Clinical Measurements: Goal: Will remain free from infection Outcome: Progressing

## 2024-05-11 NOTE — Consult Note (Signed)
 Palliative Care Consult Note                                  Date: 05/11/2024   Patient Name: [name]  DOB: 1933-03-01  MRN: [id number]  Age / Sex: 89 y.o., female  PCP: System, Provider Not In Referring Physician: Elgergawy, Caitlyn RAMAN, MD  Reason for Consultation: Establishing goals of care  Past Medical History:  Diagnosis Date   Acute diastolic (congestive) heart failure (HCC)    Bladder prolapse, female, acquired    pessary in   Chronic diastolic (congestive) heart failure (HCC)    Chronic pain syndrome    Complete uterine prolapse    Crohn's disease (HCC) 2010   Diverticulosis    E. coli UTI 06/09/2013   Fall 05/2013   Hemorrhoids    History of hypercalcemia    History of hyperglycemia    Hydronephrosis    Hyperparathyroidism    Hypertension    Multiple pelvic fractures (HCC) 07/02/2013   Osteoporosis    Pubic ramus fracture (HCC)    Urinary incontinence 09/09/2011   Vitamin D  deficiency     Subjective:   This NP [name] reviewed medical records, received report from team, assessed the patient and then meet at the patient's bedside to discuss diagnosis, prognosis, GOC, EOL wishes disposition and options.  Before meeting with the patient/family, I spent time reviewing the chart notes including nursing note from yesterday, SLP note from yesterday, internal medicine note from yesterday, GI note from yesterday. I also reviewed vital signs, nursing flowsheets, medication administrations record, labs, and imaging.  No labs today due to limited comfort care status.  I met with the patient at bedside, no family is present.  The patient is unable to meaningfully communicate.  After seeing the patient I reached out to her son/HCPOA Dr. Rolan [name].   We meet to discuss diagnosis prognosis, GOC, EOL wishes, disposition and options. Concept of Palliative Care was introduced as specialized medical care for people and their  families living with serious illness.  If focuses on providing relief from the symptoms and stress of a serious illness.  The goal is to improve quality of life for both the patient and the family. Values and goals of care important to patient and family were attempted to be elicited.  Created space and opportunity for patient  and family to explore thoughts and feelings regarding current medical situation   Natural trajectory and current clinical status were discussed. Questions and concerns addressed. Patient  encouraged to call with questions or concerns.    Patient/Family Understanding of Illness: The patient's son seems to understand that his mother has sepsis from recurrent aspiration.  He talks about an aspiration event from 2 months ago.  He understands that she has not been doing well, especially over the past 24 hours.  He states that yesterday they had a conversation with the hospitalist and agreed to continue current care for the next 12 to 24 hours to see how she does.  We spent time talking about clinical details related to her current illness and chronic illness.  Specifically talked about the progression of dementia and likely recurrent problems with aspiration and appetite.  Today's Discussion: In addition to discussions described above we have extensive discussion of various topics.  He feels that her aspiration during this hospitalization was because she was given fluids that she should have.  He states  that family is not willing to give up [name], but he understands she is likely approaching end-of-life.  We shared that they have been hoping for some improvement.  However, she notes that she is not doing well currently.  Family is not ready quite yet to give up, but he is going to call and talk to them with update on her information.  We talked about the possibility of continued care and see how she does over the next 24 hours.  However, he states that this was the goal yesterday and  it does not appear that she has been doing well.  We talked about options related to recurrent aspiration and her likely continued aspiration with intake.  We could allow her to eat/drink and accept the risks of aspiration, understanding that she would likely continue to decline and be approaching end-of-life.  The other option is to make her n.p.o. and not allow her to eat or drink at which case she would also be approaching end-of-life.  Patient's son states that he agrees that she is approaching end-of-life.  He asked about options.  I shared the recommendation with the medical team and palliative medicine would be to transition to full comfort and ensure comfort, peace, dignity knowing that she is approaching end-of-life and we are unable to prevent this. I explained comfort care as care where the patient would no longer receive aggressive medical interventions such as continuous vital signs, lab work, radiology testing, or medications not focused on comfort, peace, and dignity. This includes stopping antibiotics and weaning oxygen to room air, as these are generally not accepted as providing comfort but only prolonging the dying process artificially. All care would focus on how the patient is looking and feeling. This would include management of any symptoms that may cause discomfort, pain, shortness of breath/air hunger, increased work of breathing, cough, nausea, agitation/restlessness, anxiety, and/or secretions etc. Symptoms would be managed with medications and other non-pharmacological interventions such as spiritual support if requested, repositioning, music therapy, or therapeutic listening. Family verbalized understanding and agreement.   At end of her conversation he requested to speak with the hospitalist 1 more time before making decisions.  He shares that he will call his family prior to speaking with hospice.  He is coming to the hospital will be here at 5:00.   After speaking with the  patient's son I reached out to hospitalist Dr. Sherlon.  He shares that he will meet the patient's son at the bedside at 5:00 for further conversation.  I provided emotional and general support through therapeutic listening, empathy, sharing of stories, and other techniques. I answered all questions and addressed all concerns to the best of my ability.  Goals: TBD after meeting with the hospitalist and son  Review of Systems  Constitutional:        Denies pain in general  Respiratory:  Negative for shortness of breath.   Cardiovascular:  Negative for chest pain.  Gastrointestinal:  Negative for abdominal pain, nausea and vomiting.    Objective:   Primary Diagnoses: Present on Admission:  GIB (gastrointestinal bleeding)   Vital Signs:  BP 126/69 (BP Location: Right Arm)   Pulse 72   Temp 98.3 F (36.8 C) (Axillary)   Resp 19   Ht 5' 5 (1.651 m)   Wt 49 kg   SpO2 93%   BMI 17.98 kg/m   Physical Exam Vitals and nursing note reviewed.  Constitutional:      General: She is sleeping. She  is not in acute distress. HENT:     Head: Normocephalic and atraumatic.  Cardiovascular:     Rate and Rhythm: Normal rate.  Pulmonary:     Effort: Pulmonary effort is normal. No respiratory distress.  Abdominal:     General: Abdomen is flat. Bowel sounds are normal. There is no distension.     Palpations: Abdomen is soft.  Skin:    General: Skin is warm and dry.  Neurological:     Mental Status: She is easily aroused. She is disoriented and confused.  Psychiatric:        Mood and Affect: Mood normal.        Behavior: Behavior normal.     Palliative Assessment/Data: 10% (currently NPO)   ACP Documentation: MOST form signed 07/17/2020  Assessment & Plan:   HPI/Patient Profile: 89 y.o. female  with past medical history of CHF, Crohn's disease, diverticulosis, uterovaginal prolapse with pessary, hemorrhoids, hyperparathyroidism s/p prior parathyroidectomy, HTN, HLD,  osteoporosis, vitamin D  deficiency, CKD 3, protein calorie malnutrition.  She was admitted on 05/07/2024 with sepsis likely multifactorial in setting of acute diverticulitis and UTI; aspiration pneumonia, respiratory distress from aspiration pneumonia, BRBPR with Hemoccult positive stool, acute metabolic encephalopathy on underlying dementia, severe protein calorie malnutrition, and others.  Palliative medicine was consulted for GOC conversations.  SUMMARY OF RECOMMENDATIONS   DNR-Limited Continue focus on comfort No escalation of care Anticipate possible transition to full comfort care in the next 24 hours If family agreeable to risks can allow oral intake after son discusses with hospitalist Anticipate patient approaching end-of-life regardless of path chosen Palliative medicine will continue to follow  Symptom Management:  Per primary team Palliative medicine is available to assist as needed  Code Status: DNR - Limited (DNR/DNI)  Prognosis:  < 4 weeks  Discharge Planning:  To Be Determined   Discussed with: Patient, family, medical team, nursing team    Thank you for allowing us  to participate in the care of [name] PMT will continue to support holistically.  Time Total: 60 min  Detailed review of medical records (labs, imaging, vital signs), medically appropriate exam, discussed with treatment team, counseling and education to patient, family, & staff, documenting clinical information, medication management, coordination of care  Signed by: Caitlyn Kays, NP Palliative Medicine Team  Team Phone # [phone] (Nights/Weekends)  05/11/2024, 2:24 PM

## 2024-05-11 NOTE — Plan of Care (Signed)
   Problem: Pain Managment: Goal: General experience of comfort will improve and/or be controlled Outcome: Progressing   Problem: Skin Integrity: Goal: Risk for impaired skin integrity will decrease Outcome: Progressing

## 2024-05-11 NOTE — Progress Notes (Addendum)
 PROGRESS NOTE    [name]  FMW:[id number] DOB: 08-13-1932 DOA: 05/07/2024 PCP: System, Provider Not In   Chief Complaint  Patient presents with   Respiratory Distress    Brief Narrative:    [name] is a 89 y.o. female with medical history significant for CHF, Crohn's disease, diverticulosis, uterovaginal prolapse with pessary, hemorrhoids, hyperparathyroidism s/p prior parathyroidectomy, HTN, HLD, osteoporosis, vitamin D  deficiency, CKD 3, protein calorie malnutrition.  Patient brought into ED by EMS from nursing home for increasing confusion and fever.  Upon patient arrival, she was tachypneic with SpO2 in low 90s.  Was started on CPAP, but then weaned quickly to 3 L O2 via [place].  Transfused 1 unit of blood given hemoglobin of 7 and positive occult blood in stool sample.  CT chest/abdomen/pelvis, ruled out PE, but was significant for acute diverticulitis, and UA was significantly positive for UA, and by GI, her hemoglobin has stabilized, on admission with bilateral opacity, per son patient had aspiration pneumonia or she was treated with antibiotic at her facility, patient initially has improved, she was noted to have significant cough with concern for aspiration, she was evaluated by SLP, she is in significant respiratory distress this morning.  Assessment & Plan:   Principal Problem:   GIB (gastrointestinal bleeding) Active Problems:   Iron deficiency anemia due to chronic blood loss   Heme positive stool   Diverticulitis   Abnormal CT of the abdomen   History of Crohn's disease   Protein-calorie malnutrition, severe  Sepsis, POA -Multifactorial, evidence of acute diverticulitis, UTI and aspiration pneumonia - CT chest/abdomen/pelvis significant for diverticulitis.  Cystitis, and bibasilar small pleural effusion with adjacent consolidation. - Continue with IV Zosyn.  Respiratory distress Aspiration pneumonia -patient with increased work of breathing, even though no  hypoxia, but she is tachypneic with significant use of accessory muscles - X-ray significant for worsening bilateral opacity, atelectasis versus aspiration - She is with known risk of aspiration recently treated with aspiration of pneumonia at her facility. - Continue with IV Zosyn. - Has increased work of breathing, does look she keep aspirating, as well with poor appetite, so we will keep n.p.o. for now, will start on scheduled IV Robinul  . - As needed NTS . - On IV fluids as she is NPO.  I have discussed with son, he understands the risks of aspiration, he reports this is ongoing process for last few months, so for now would like to try her on some feeding, so we will start on dysphagia 1 pudding thick liquid, and reconsult SLP. - Continue with IV Zosyn  Acute diverticulitis - Continue with IV Zosyn. - N.p.o. due to aspiration risk  Right red blood per rectum Occult positive stool Iron deficiency anemia -Received 1 unit PRBC overnight -Continue with PPI and Carafate -Management per GI - Recent anemia workup significant for low ferritin and iron level, continue with p.o. iron, hold on IV iron due to sepsis on presentation - B12 borderline, started on IM supplements inue with IM given known history of chron's disease and poor GI absorption - Hemoglobin remained stable  Acute metabolic encephalopathy Underlying significant dementia - Worsening mental status in the setting of sepsis, dehydration - With underlying advanced dementia, high risk for delirium - on B12 supplements  History of chron's disease -On chronic budesonide  -Continue with budesonide  per GI  Severe protein calorie malnutrition -RD consult pending  Abnormal findings in urinary bladder on imaging (cystitis versus neoplasm) - Most likely due to cystitis -  Outpatient referral to urology, she is already having urologist she is following with them [facility]  Right forearm skin tear -Wound care consulted  Chronic  diastolic CHF - Presents with volume overload  Hypertension - Pressures elevated, as well she has mild tachycardia, so we will start on low-dose metoprolol - Telemetry Mitry events significant for atrial arrhythmias, but no evidence of A-fib, as few readings on telemetry monitor reads A-fib)  Hypernatremia -Due to volume depletion/dehydration, resolved with hydration  Hypokalemia - Mains low this morning, replaced  Dementia - Continue with supportive care - Continue with home Aricept   Goals of care discussion End-of-life care -On 12/1 discussed with son [name] at bedside, and daughter [name] by phone, patient appears to be with significant aspiration, increased work of breathing, with very poor oral intake, did bound, advanced dementia, known history of progressive aspiration, at this point decision is for no escalation of care, continue with current simple measures as IV fluids, antibiotics, nebulizer treatment if needed, suctioning if needed, certainly no intubation, BiPAP or ICU transfer, palliative medicine is involved, if she fails to improve then family would consider comfort, but for now they would like to continue these measures  DVT prophylaxis: ( SCD) Code Status: (DNR, no heroics Family Communication: (None at bedside this morning, I have discussed with son and daughter by phone). Disposition:   Currently increased work of breathing, with aspiration pneumonia, altered, n.p.o. still in need of IV antibiotics   Consultants:  Gastroenterology     Subjective:  No significant events overnight, she required 1 dose of morphine  this morning due to dyspnea and increased work of breathing,  Objective: Vitals:   05/11/24 0400 05/11/24 0755 05/11/24 0823 05/11/24 1200  BP: 114/66 (!) 134/95  126/69  Pulse: 89   72  Resp: (!) 27   19  Temp:  98.3 F (36.8 C)    TempSrc:  Axillary    SpO2: 95%  95% 93%  Weight:      Height:        Intake/Output Summary (Last  24 hours) at 05/11/2024 1513 Last data filed at 05/11/2024 0300 Gross per 24 hour  Intake 125.3 ml  Output 350 ml  Net -224.7 ml   Filed Weights   05/08/24 0019  Weight: 49 kg    Examination:  Somnolent, but wakes up, appears comfortable, demented at baseline  Diminished air entry with rhonchi at the bases  Tachycardic  Abdomen soft  Extremities with no edema, contracted .   Data Reviewed: I have personally reviewed following labs and imaging studies  CBC: Recent Labs  Lab 05/07/24 1920 05/07/24 1931 05/08/24 0611 05/08/24 1600 05/09/24 0347 05/10/24 0320  WBC 11.3*  --  9.4 10.0 8.2 8.3  NEUTROABS 9.7*  --   --   --   --   --   HGB 7.0* 7.8*  7.8* 7.7* 8.6* 8.9* 8.9*  HCT 24.8* 23.0*  23.0* 25.6* 28.3* 29.7* 30.1*  MCV 84.4  --  81.8 81.6 81.6 81.4  PLT 320  --  252 271 297 339    Basic Metabolic Panel: Recent Labs  Lab 05/07/24 1920 05/07/24 1931 05/08/24 0611 05/09/24 0347 05/10/24 0320  NA 143 147*  147* 145 142 143  K 3.0* 3.0*  3.0* 3.7 3.2* 4.4  CL 113* 112* 114* 109 111  CO2 20*  --  26 21* 24  GLUCOSE 178* 171* 93 73 92  BUN 34* 32* 28* 25* 19  CREATININE 1.02* 1.10* 0.84  0.78 0.80  CALCIUM  8.0*  --  8.1* 8.5* 8.7*  MG 2.1  --  2.1 2.2 2.1  PHOS  --   --  2.6 2.6 2.7    GFR: Estimated Creatinine Clearance: 37.6 mL/min (by C-G formula based on SCr of 0.8 mg/dL).  Liver Function Tests: Recent Labs  Lab 05/07/24 1920 05/08/24 0611  AST 19 16  ALT 13 13  ALKPHOS 71 53  BILITOT 0.4 0.6  PROT 5.5* 5.0*  ALBUMIN 2.4* 2.2*    CBG: Recent Labs  Lab 05/07/24 1937 05/10/24 0835 05/10/24 1155 05/10/24 1645  GLUCAP 178* 72 87 103*     Recent Results (from the past 240 hours)  Resp panel by RT-PCR (RSV, Flu A&B, Covid) Anterior Nasal Swab     Status: None   Collection Time: 05/07/24  7:06 PM   Specimen: Anterior Nasal Swab  Result Value Ref Range Status   SARS Coronavirus 2 by RT PCR NEGATIVE NEGATIVE Final   Influenza A by PCR  NEGATIVE NEGATIVE Final   Influenza B by PCR NEGATIVE NEGATIVE Final    Comment: (NOTE) The Xpert Xpress SARS-CoV-2/FLU/RSV plus assay is intended as an aid in the diagnosis of influenza from Nasopharyngeal swab specimens and should not be used as a sole basis for treatment. Nasal washings and aspirates are unacceptable for Xpert Xpress SARS-CoV-2/FLU/RSV testing.  Fact Sheet for Patients: bloggercourse.com  Fact Sheet for Healthcare Providers: seriousbroker.it  This test is not yet approved or cleared by the [place]  FDA and has been authorized for detection and/or diagnosis of SARS-CoV-2 by FDA under an Emergency Use Authorization (EUA). This EUA will remain in effect (meaning this test can be used) for the duration of the COVID-19 declaration under Section 564(b)(1) of the Act, 21 U.S.C. section 360bbb-3(b)(1), unless the authorization is terminated or revoked.     Resp Syncytial Virus by PCR NEGATIVE NEGATIVE Final    Comment: (NOTE) Fact Sheet for Patients: bloggercourse.com  Fact Sheet for Healthcare Providers: seriousbroker.it  This test is not yet approved or cleared by the [place]  FDA and has been authorized for detection and/or diagnosis of SARS-CoV-2 by FDA under an Emergency Use Authorization (EUA). This EUA will remain in effect (meaning this test can be used) for the duration of the COVID-19 declaration under Section 564(b)(1) of the Act, 21 U.S.C. section 360bbb-3(b)(1), unless the authorization is terminated or revoked.  Performed at [facility], [address], [place], [place] [address]   Blood culture (routine x 2)     Status: None (Preliminary result)   Collection Time: 05/07/24  7:20 PM   Specimen: BLOOD LEFT FOREARM  Result Value Ref Range Status   Specimen Description BLOOD LEFT FOREARM  Final   Special Requests   Final    BOTTLES  DRAWN AEROBIC AND ANAEROBIC Blood Culture adequate volume   Culture   Final    NO GROWTH 4 DAYS Performed at [facility], [address], [place], [place] [address]    Report Status PENDING  Incomplete  Blood culture (routine x 2)     Status: None (Preliminary result)   Collection Time: 05/08/24  6:11 AM   Specimen: BLOOD RIGHT ARM  Result Value Ref Range Status   Specimen Description BLOOD RIGHT ARM  Final   Special Requests   Final    BOTTLES DRAWN AEROBIC AND ANAEROBIC Blood Culture adequate volume   Culture   Final    NO GROWTH 3 DAYS Performed at  [facility], [address] [place], [place] [address]    Report Status PENDING  Incomplete  Urine Culture (for pregnant, neutropenic or urologic patients or patients with an indwelling urinary catheter)     Status: None   Collection Time: 05/08/24  1:23 PM   Specimen: Urine, Clean Catch  Result Value Ref Range Status   Specimen Description URINE, CLEAN CATCH  Final   Special Requests NONE  Final   Culture   Final    NO GROWTH Performed at [facility], [address], [place], [place] [address]    Report Status 05/09/2024 FINAL  Final         Radiology Studies: DG Chest Port 1 View Result Date: 05/09/2024 EXAM: 1 VIEW(S) XRAY OF THE CHEST 05/09/2024 06:52:00 PM COMPARISON: 05/07/2024 CLINICAL HISTORY: Cough FINDINGS: LUNGS AND PLEURA: Chronic volume loss in right hemithorax. Small right and moderate left pleural effusions, increased. Bibasilar opacities, slightly progressed from prior. No pneumothorax. HEART AND MEDIASTINUM: Aortic atherosclerosis. No acute abnormality of the cardiac and mediastinal silhouettes. BONES AND SOFT TISSUES: Chronic right rib fractures. IMPRESSION: 1. Small right and moderate left pleural effusions, increased. 2. Bibasilar opacities, slightly progressed from prior, which may reflect atelectasis and/or pneumonia. Electronically signed by: Norman Gatlin MD 05/09/2024 07:10 PM EST RP  Workstation: [id number]        Scheduled Meds:  budesonide   9 mg Oral Daily   collagenase    Topical Daily   cyanocobalamin   1,000 mcg Subcutaneous Daily   Followed by   NOREEN ON 05/15/2024] cyanocobalamin   1,000 mcg Subcutaneous Q30 days   docusate sodium   100 mg Oral BID   donepezil   10 mg Oral QHS   ferrous sulfate   325 mg Oral Q breakfast   glycopyrrolate   0.1 mg Intravenous QID   metoprolol  tartrate  25 mg Oral BID   mirtazapine   7.5 mg Oral QHS   multivitamin with minerals  1 tablet Oral Daily   pantoprazole   40 mg Oral Daily   sucralfate   1 g Oral TID AC & HS   thiamine   100 mg Oral Daily   Continuous Infusions:  lactated ringers  75 mL/hr at 05/10/24 1616   piperacillin -tazobactam (ZOSYN )  IV 3.375 g (05/11/24 1342)     LOS: 1 day       Brayton Lye, MD [facility]   To contact the attending provider between 7A-7P or the covering provider during after hours 7P-7A, please log into the web site [email or web address] and access using universal  password for that web site. If you do not have the password, please call the hospital operator.  05/11/2024, 3:13 PM

## 2024-05-12 DIAGNOSIS — Z8719 Personal history of other diseases of the digestive system: Secondary | ICD-10-CM | POA: Diagnosis not present

## 2024-05-12 DIAGNOSIS — K5792 Diverticulitis of intestine, part unspecified, without perforation or abscess without bleeding: Secondary | ICD-10-CM | POA: Diagnosis not present

## 2024-05-12 DIAGNOSIS — D500 Iron deficiency anemia secondary to blood loss (chronic): Secondary | ICD-10-CM | POA: Diagnosis not present

## 2024-05-12 DIAGNOSIS — R935 Abnormal findings on diagnostic imaging of other abdominal regions, including retroperitoneum: Secondary | ICD-10-CM | POA: Diagnosis not present

## 2024-05-12 LAB — CULTURE, BLOOD (ROUTINE X 2)
Culture: NO GROWTH
Special Requests: ADEQUATE

## 2024-05-12 MED ORDER — MORPHINE SULFATE (PF) 2 MG/ML IV SOLN
1.0000 mg | Freq: Once | INTRAVENOUS | Status: DC
  Filled 2024-05-12: qty 1

## 2024-05-12 NOTE — Plan of Care (Signed)
  Problem: Clinical Measurements: Goal: Respiratory complications will improve Outcome: Progressing Goal: Cardiovascular complication will be avoided Outcome: Progressing   Problem: Pain Managment: Goal: General experience of comfort will improve and/or be controlled Outcome: Progressing   Problem: Education: Goal: Knowledge of General Education information will improve Description: Including pain rating scale, medication(s)/side effects and non-pharmacologic comfort measures Outcome: Not Progressing   Problem: Clinical Measurements: Goal: Will remain free from infection Outcome: Not Progressing   Problem: Activity: Goal: Risk for activity intolerance will decrease Outcome: Not Progressing   Problem: Skin Integrity: Goal: Risk for impaired skin integrity will decrease Outcome: Not Progressing

## 2024-05-12 NOTE — Progress Notes (Signed)
 Daily Progress Note   Date: 05/12/2024   Patient Name: [name]  DOB: [birth year]  MRN: [id number]  Age / Sex: 89 y.o., female  Attending Physician: Raenelle Donalda HERO, MD Primary Care Physician: System, Provider Not In Admit Date: 05/07/2024 Length of Stay: 2 days  Reason for Follow-up: Establishing goals of care  Past Medical History:  Diagnosis Date   Acute diastolic (congestive) heart failure (HCC)    Bladder prolapse, female, acquired    pessary in   Chronic diastolic (congestive) heart failure (HCC)    Chronic pain syndrome    Complete uterine prolapse    Crohn's disease (HCC) 2010   Diverticulosis    E. coli UTI 06/09/2013   Fall 05/2013   Hemorrhoids    History of hypercalcemia    History of hyperglycemia    Hydronephrosis    Hyperparathyroidism    Hypertension    Multiple pelvic fractures (HCC) 07/02/2013   Osteoporosis    Pubic ramus fracture (HCC)    Urinary incontinence 09/09/2011   Vitamin D  deficiency     Subjective:   Subjective: Chart Reviewed. Updates received. Patient Assessed. Created space and opportunity for patient  and family to explore thoughts and feelings regarding current medical situation.  Today's Discussion: Today before meeting with the patient/family, I reviewed the chart notes including internal medicine note from yesterday, nursing note from yesterday, nurse note from today, internal medicine note from today, SLP note from today. I also reviewed vital signs, nursing flowsheets, medication administrations record, labs, and imaging.  No labs were drawn today.  Today saw the patient at bedside, she is sitting up and awake in the bed being fed by her youngest daughter.  Present at bedside are multiple family members including her oldest daughter, her youngest daughter, her brother, her sister-in-law.  Family shares that the patient's son Lael is the HCPOA, as discussed yesterday with Lael.  We spent time reviewing her current clinical  situation.  The patient's family present shared that they have seen her clinical deterioration over time.  We discussed conversations that were had with her son/HCPOA yesterday.  They shared that she is eating with active feeding.  They have concerns that at the nursing home staff backs off as soon as she says she does not want anymore.  However, with encouragement she will typically eat more.  We discussed difficulties with nutrition and challenges with recurrent aspiration.  We spent time talk about the current plan of care including DNR/DNI, continue to treat treatable, allow some time for outcomes.  We talked about possible trajectories moving forward and her difficult situation.  I shared that she will likely have recurrent aspiration events, although this.  However, family is accepting of these risks in order to allow her to eat/drink we are using modified textures and diet to try to minimize risk is much as possible.  We discussed that the other option would be to add a ladder to eat/drink, but that would lead toward end-of-life.  However, recurrent aspiration and including recurrent aspiration pneumonia would likely result in further decline toward end-of-life.  At this time is difficult to determine how this will play out.  However, I shared the role of palliative medicine for ongoing conversations while admitted to the hospital to have conversations based on how she progresses in the coming days.  I also shared that outpatient palliative medicine would be an option after discharge to continue discussions depending on how her clinical picture evolves over time outside the  hospital.  The daughters seem interested in the possibility of outpatient palliative care at discharge.  Finally, we talked about discomfort and I shared that she does have pain medicine available as needed for discomfort.  The patient states she hurts all over at this point but is easily redirected and being fed by her daughter.   I encouraged them to call nursing staff if they feel that the patient is uncomfortable.  I shared that I would follow-up on Friday for ongoing conversations and to check in on her progress.  I provided contact information for the palliative medicine team for any questions or concerns while the patient remains admitted. I provided emotional and general support through therapeutic listening, empathy, sharing of stories, and other techniques. I answered all questions and addressed all concerns to the best of my ability.  Review of Systems  Respiratory:  Negative for shortness of breath.   Gastrointestinal:  Negative for abdominal pain, nausea and vomiting.    Objective:   Primary Diagnoses: Present on Admission:  GIB (gastrointestinal bleeding)   Vital Signs:  BP (!) 141/79   Pulse 74   Temp 97.8 F (36.6 C) (Axillary)   Resp 18   Ht 5' 5 (1.651 m)   Wt 49 kg   SpO2 95%   BMI 17.98 kg/m   Physical Exam Vitals and nursing note reviewed.  Constitutional:      General: She is not in acute distress.    Appearance: She is ill-appearing.  HENT:     Head: Normocephalic and atraumatic.  Cardiovascular:     Rate and Rhythm: Normal rate.  Pulmonary:     Effort: Pulmonary effort is normal. No respiratory distress.  Abdominal:     General: Abdomen is flat.  Skin:    General: Skin is warm and dry.  Psychiatric:        Mood and Affect: Mood normal.        Behavior: Behavior normal.     Palliative Assessment/Data: 30-40%   Existing Vynca/ACP Documentation: MOST form signed 07/17/2020  Assessment & Plan:   HPI/Patient Profile:  89 y.o. female  with past medical history of CHF, Crohn's disease, diverticulosis, uterovaginal prolapse with pessary, hemorrhoids, hyperparathyroidism s/p prior parathyroidectomy, HTN, HLD, osteoporosis, vitamin D  deficiency, CKD 3, protein calorie malnutrition.  She was admitted on 05/07/2024 with sepsis likely multifactorial in setting of acute  diverticulitis and UTI; aspiration pneumonia, respiratory distress from aspiration pneumonia, BRBPR with Hemoccult positive stool, acute metabolic encephalopathy on underlying dementia, severe protein calorie malnutrition, and others.   Palliative medicine was consulted for GOC conversations.  SUMMARY OF RECOMMENDATIONS   DNR-Limited No escalation of care Continue current scope of care Time for outcomes Possible outpatient palliative care discharge Palliative medicine will follow-up Friday, 05/14/2024 Please notify us  of any significant clinical change or new palliative needs in the interim  Symptom Management:  Per primary team Palliative medicine is available to assist as needed  Code Status: DNR - Limited (DNR/DNI)  Prognosis: < 6 months  Discharge Planning: To Be Determined  Discussed with: Patient, family, medical team, nursing team  Thank you for allowing us  to participate in the care of JERLYN PAIN PMT will continue to support holistically.  Time Total: 60 min  Detailed review of medical records (labs, imaging, vital signs), medically appropriate exam, discussed with treatment team, counseling and education to patient, family, & staff, documenting clinical information, medication management, coordination of care  Camellia Kays, NP Palliative Medicine Team  Team Phone #  [phone] (Nights/Weekends)  02/06/2021, 8:17 AM

## 2024-05-12 NOTE — Evaluation (Addendum)
 Clinical/Bedside Swallow Evaluation Patient Details  Name: FAYE STROHMAN MRN: [id number] Date of Birth: [birth year]  Today's Date: 05/12/2024 Time: SLP Start Time (ACUTE ONLY): 9167 SLP Stop Time (ACUTE ONLY): 0839 SLP Time Calculation (min) (ACUTE ONLY): 7 min  Past Medical History:  Past Medical History:  Diagnosis Date   Acute diastolic (congestive) heart failure (HCC)    Bladder prolapse, female, acquired    pessary in   Chronic diastolic (congestive) heart failure (HCC)    Chronic pain syndrome    Complete uterine prolapse    Crohn's disease (HCC) 2010   Diverticulosis    E. coli UTI 06/09/2013   Fall 05/2013   Hemorrhoids    History of hypercalcemia    History of hyperglycemia    Hydronephrosis    Hyperparathyroidism    Hypertension    Multiple pelvic fractures (HCC) 07/02/2013   Osteoporosis    Pubic ramus fracture (HCC)    Urinary incontinence 09/09/2011   Vitamin D  deficiency    Past Surgical History:  Past Surgical History:  Procedure Laterality Date   COLONOSCOPY  12/23/2008   crohn's, diverticulosis, internal hemorrhoids   HIP FRACTURE SURGERY Left    left, prosthesis   LAPAROSCOPY  10/2009   PARATHYROIDECTOMY Left 08/30/2013   Procedure: LEFT INFERIOR PARATHYROIDECTOMY;  Surgeon: Krystal CHRISTELLA Spinner, MD;  Location: [facility];  Service: General;  Laterality: Left;   RHINOPLASTY     TONSILLECTOMY     HPI:  ROMEKA SCIFRES is a 89 y.o. female admitted from SNF confusion and fever. Found to have GI bleed, sepsis, acute metabolic encephalopathy. CT chest small pleural effusions with adjacent consolidation or atelectasis in the lower lobes. There is a patulous upper thoracic esophagus above the carina level with fluid filling. CXR 11/30 bibasilar opacities, slightly progressed from prior, which may reflect atelectasis and/or pneumonia. BSE 12/1 pt with s/s aspiration, MD spoke with son who reported he is aware of swallowing difficulties and pt on thick liquids at SNF. ST rec'd  regular, nectar thick and no further needs, ST signed off. Repeat CXR showed  Bibasilar opacities, slightly progressed from prior, which may reflect atelectasis and/or pneumonia. MD made pt NPO, after dicussion with son resumed po's, ordering puree and pudding thick liquids and reordered ST for diet recommendations.    PMH: dementia, CHF, Crohn's disease, diverticulosis, hyperparathyroidism s/p prior parathyroidectomy, HTN, HLD, osteoporosis, vitamin D  deficiency, CKD 3, protein calorie malnutrition. (P)    Assessment / Plan / Recommendation  Clinical Impression  Pt known to this SLP from assessment 12/1. Pt has history of dysphagia and consumed thick liquids at SNF prior to admission. Pt is demonstrating subtle s/s aspiration with intermittent and immediate and delayed throat clears with nectar. She masticated cracker with slightly increased work of breathing but cleared oral cavity. Spoke with son prior to SLP's assessment and discussed current status and wishes. His goals are for his mom to have nutrition. Palliative care discussions ongoing and son stated we are not at comfort care yet, will see how she does. Discussed pt's chronic risk of aspiration despite consistency and recommendation of liquid consistency. Since pt not officially on comfort feeds and she may have increased coughing with thin liquids decreasing comfort, son agreeable to return to nectar thick liquids. Pt can upgrade from puree and recommend Dys 2 for energy conservation during mastication. ST will follow as family would benefit from continued education for aspiration risk due to likely chronic dysphagia and continued risk for pna. SLP Visit Diagnosis:  Dysphagia, unspecified (R13.10)    Aspiration Risk  Moderate aspiration risk    Diet Recommendation           Other Recommendations Oral Care Recommendations: Oral care BID     Swallow Evaluation Recommendations Recommendations: PO diet PO Diet Recommendation: Dysphagia 2  (Finely chopped);Mildly thick liquids (Level 2, nectar thick) Liquid Administration via: Cup;Straw Medication Administration: Crushed with puree Supervision: Staff to assist with self-feeding Swallowing strategies  : Slow rate;Small bites/sips Postural changes: Position pt fully upright for meals Oral care recommendations: Oral care BID (2x/day)   Assistance Recommended at Discharge    Functional Status Assessment Patient has had a recent decline in their functional status and demonstrates the ability to make significant improvements in function in a reasonable and predictable amount of time.  Frequency and Duration min 2x/week  2 weeks       Prognosis Prognosis for improved oropharyngeal function: Fair Barriers to Reach Goals: Cognitive deficits      Swallow Study   General Date of Onset: 05/12/24 HPI: NICK ARMEL is a 89 y.o. female admitted from SNF confusion and fever. Found to have GI bleed, sepsis, acute metabolic encephalopathy. CT chest small pleural effusions with adjacent consolidation or atelectasis in the lower lobes. There is a patulous upper thoracic esophagus above the carina level with fluid filling. CXR 11/30 bibasilar opacities, slightly progressed from prior, which may reflect atelectasis and/or pneumonia. BSE 12/1 pt with s/s aspiration, MD spoke with son who reported he is aware of swallowing difficulties and pt on thick liquids at SNF. ST rec'd regular, nectar thick and no further needs, ST signed off. Repeat CXR showed  Bibasilar opacities, slightly progressed from prior, which may reflect atelectasis and/or pneumonia. MD made pt NPO, after dicussion with son resumed po's, ordering puree and pudding thick liquids and reordered ST for diet recommendations.    PMH: dementia, CHF, Crohn's disease, diverticulosis, hyperparathyroidism s/p prior parathyroidectomy, HTN, HLD, osteoporosis, vitamin D  deficiency, CKD 3, protein calorie malnutrition. (P) Type of Study: Bedside  Swallow Evaluation Previous Swallow Assessment:  (see HPI) Diet Prior to this Study: Dysphagia 1 (pureed);Extremely thick liquids (Level 4, pudding thick) Temperature Spikes Noted: No Respiratory Status: Room air History of Recent Intubation: No Behavior/Cognition: Alert;Cooperative;Pleasant mood;Confused;Requires cueing Oral Cavity Assessment: Within Functional Limits Oral Care Completed by SLP: No Oral Cavity - Dentition: Adequate natural dentition Vision: Functional for self-feeding Self-Feeding Abilities: Needs assist Patient Positioning: Upright in bed Baseline Vocal Quality: Normal    Oral/Motor/Sensory Function Overall Oral Motor/Sensory Function: Within functional limits   Ice Chips Ice chips: Not tested   Thin Liquid Thin Liquid: Not tested (pt on thick liquids prior)    Nectar Thick Nectar Thick Liquid: Impaired Presentation: Cup;Straw Pharyngeal Phase Impairments: Throat Clearing - Immediate   Honey Thick Honey Thick Liquid: Not tested   Puree Puree: Not tested   Solid     Solid: Within functional limits      Dustin Olam Bull 05/12/2024,9:12 AM

## 2024-05-12 NOTE — Progress Notes (Addendum)
 PROGRESS NOTE        PATIENT DETAILS Name: [name] Age: 89 y.o. Sex: female Date of Birth: 1932-11-10 Admit Date: 05/07/2024 Admitting Physician Dorothyann CHRISTELLA Helling, MD ERE:[email or web address], Provider Not In  Brief Summary: Patient is a 89 y.o.  female with history of chron's disease, chronic HFpEF, HTN, dementia who presented with sepsis likely secondary to aspiration pneumonia.  Significant events: 11/28>> admit to [facility]-confusion/fever  Significant studies: 11/28>> CXR: Multiple chronic right-sided fractures, moderate to severe left basilar scarring and or atelectasis. 11/28>> CTA chest: No PE 11/28>> CT abdomen/pelvis: Acute diverticulitis in the descending colon. 11/30>> CXR: Small right/moderate left pleural effusion, bibasilar opacities-slightly progressed from prior.  Significant microbiology data: 11/28>> COVID/influenza/RSV PCR: Negative 11/28>> blood culture: No growth 11/29>> blood culture: No growth 11/29>> urine culture: No growth  Procedures: None  Consults: Palliative care  Subjective: Pleasantly confused-appears calm/quiet-seems to be accumulating some secretions but able to cough.  Objective: Vitals: Blood pressure (!) 173/83, pulse 75, temperature 97.8 F (36.6 C), temperature source Axillary, resp. rate 18, height 5' 5 (1.651 m), weight 49 kg, SpO2 96%.   Exam: Gen Exam:Alert awake-not in any distress HEENT:atraumatic, normocephalic Chest: Moving air well-has transmitted upper airway sounds. CVS:S1S2 regular Abdomen:soft non tender, non distended Extremities:no edema Neurology: B/L lower extremity contractures Skin: no rash  Pertinent Labs/Radiology:    Latest Ref Rng & Units 05/10/2024    3:20 AM 05/09/2024    3:47 AM 05/08/2024    4:00 PM  CBC  WBC 4.0 - 10.5 K/uL 8.3  8.2  10.0   Hemoglobin 12.0 - 15.0 g/dL 8.9  8.9  8.6   Hematocrit 36.0 - 46.0 % 30.1  29.7  28.3   Platelets 150 - 400 K/uL 339  297  271      Lab Results  Component Value Date   NA 143 05/10/2024   K 4.4 05/10/2024   CL 111 05/10/2024   CO2 24 05/10/2024      Assessment/Plan: Sepsis secondary to aspiration pneumonia Sepsis physiology improved Stable on Zosyn Still with significant transmitted upper airway sounds and will always remain at risk for aspiration episodes Perative care following-engaging with family  Acute respiratory distress secondary to aspiration pneumonia Had increased work of breathing several days back but seems comfortable today SLP following-on dysphagia 1 diet with nectar thick liquids Per prior notes-family okay to resume diet-accepting full risk of aspiration.  Acute diverticulitis Probably an incidental finding-belly exam benign Already covered with IV Zosyn  Lower GI bleeding with acute blood loss anemia No further lower GI bleeding-brown stools per last GI note No plans for endoscopic evaluation per GI Hb stable-did receive 1 unit of PRBC on 11/28 Follow CBC.  Acute metabolic encephalopathy superimposed on dementia Likely secondary to sepsis physiology/aspiration pneumonia Pleasantly confused this morning Continue to treat underlying pneumonia Delirium precautions.  Chronic HFpEF Currently euvolemic  HTN BP on the higher side Continue metoprolol and see how she does-if continues to be persistently elevated-Will adjust dosage/add other medications.  Chron's disease Budesonide  orally  GERD PPI/Carafate  Borderline B12 deficiency Continue supplementation.  Hypernatremia Resolved  Asymmetric right sided bladder wall thickening (cystitis versus neoplasm) Already treated with empiric antibiotics-but cultures negative Needs outpatient follow-up with urology for cystoscopy if desired by patient/family.  Dementia Continue Aricept /Remeron  Maintain delirium precautions.  Thyroid  nodule 1.6 cm Incidental finding on CT  imaging Not sure if any significant benefit for further  workup given advanced age/dementia.  Will defer to PCP.  Palliative care DNR Reviewed last few notes from prior MD and palliative care team-no further escalation in care Allow time for clinical outcomes.  Nutrition Status: Nutrition Problem: Severe Malnutrition Etiology: chronic illness Signs/Symptoms: severe muscle depletion, severe fat depletion Interventions: Refer to RD note for recommendations, Ensure Enlive (each supplement provides 350kcal and 20 grams of protein), Magic cup, MVI  Pressure Ulcer: Agree with assessment and plan as outlined below. Wound 05/08/24 1449 Pressure Injury Sacrum Lower Deep Tissue Pressure Injury - Purple or maroon localized area of discolored intact skin or blood-filled blister due to damage of underlying soft tissue from pressure and/or shear. (Active)     Wound 05/08/24 1450 Pressure Injury Vertebral column Lower Stage 3 -  Full thickness tissue loss. Subcutaneous fat may be visible but bone, tendon or muscle are NOT exposed. (Active)    Code status:   Code Status: Limited: Do not attempt resuscitation (DNR) -DNR-LIMITED -Do Not Intubate/DNI    DVT Prophylaxis: Place and maintain sequential compression device Start: 05/08/24 0211 Place TED hose Start: 05/07/24 2325   Family Communication: 2 daughters at bedside   Disposition Plan: Status is: Inpatient Remains inpatient appropriate because: Severity of illness   Planned Discharge Destination:Skilled nursing facility   Diet: Diet Order             DIET DYS 2 Room service appropriate? No; Fluid consistency: Nectar Thick  Diet effective now                     Antimicrobial agents: Anti-infectives (From admission, onward)    Start     Dose/Rate Route Frequency Ordered Stop   05/09/24 1800  vancomycin (VANCOREADY) IVPB 750 mg/150 mL  Status:  Discontinued        750 mg 150 mL/hr over 60 Minutes Intravenous Every 48 hours 05/08/24 0332 05/08/24 1056   05/08/24 0600   piperacillin-tazobactam (ZOSYN) IVPB 3.375 g        3.375 g 12.5 mL/hr over 240 Minutes Intravenous Every 8 hours 05/08/24 0326     05/07/24 2000  ceFEPIme (MAXIPIME) 2 g in sodium chloride  0.9 % 100 mL IVPB  Status:  Discontinued        2 g 200 mL/hr over 30 Minutes Intravenous Every 24 hours 05/07/24 1948 05/07/24 1955   05/07/24 2000  vancomycin (VANCOCIN) IVPB 1000 mg/200 mL premix        1,000 mg 200 mL/hr over 60 Minutes Intravenous  Once 05/07/24 1950 05/07/24 2225   05/07/24 2000  piperacillin-tazobactam (ZOSYN) IVPB 3.375 g        3.375 g 100 mL/hr over 30 Minutes Intravenous  Once 05/07/24 1956 05/07/24 2101   05/07/24 1945  ceFEPIme (MAXIPIME) 2 g in sodium chloride  0.9 % 100 mL IVPB  Status:  Discontinued        2 g 200 mL/hr over 30 Minutes Intravenous Every 12 hours 05/07/24 1944 05/07/24 1948        MEDICATIONS: Scheduled Meds:  budesonide   9 mg Oral Daily   collagenase   Topical Daily   cyanocobalamin   1,000 mcg Subcutaneous Daily   Followed by   NOREEN ON 05/15/2024] cyanocobalamin   1,000 mcg Subcutaneous Q30 days   docusate sodium   100 mg Oral BID   donepezil   10 mg Oral QHS   ferrous sulfate  325 mg Oral Q breakfast   glycopyrrolate   0.1 mg Intravenous QID   metoprolol tartrate  25 mg Oral BID   mirtazapine   7.5 mg Oral QHS   multivitamin with minerals  1 tablet Oral Daily   pantoprazole  40 mg Oral Daily   sucralfate  1 g Oral TID AC & HS   thiamine  100 mg Oral Daily   Continuous Infusions:  dextrose  5 % and 0.45 % NaCl 40 mL/hr at 05/11/24 1723   piperacillin-tazobactam (ZOSYN)  IV 3.375 g (05/12/24 0507)   PRN Meds:.albuterol, guaiFENesin-dextromethorphan, morphine  injection   I have personally reviewed following labs and imaging studies  LABORATORY DATA: CBC: Recent Labs  Lab 05/07/24 1920 05/07/24 1931 05/08/24 0611 05/08/24 1600 05/09/24 0347 05/10/24 0320  WBC 11.3*  --  9.4 10.0 8.2 8.3  NEUTROABS 9.7*  --   --   --   --   --   HGB  7.0* 7.8*  7.8* 7.7* 8.6* 8.9* 8.9*  HCT 24.8* 23.0*  23.0* 25.6* 28.3* 29.7* 30.1*  MCV 84.4  --  81.8 81.6 81.6 81.4  PLT 320  --  252 271 297 339    Basic Metabolic Panel: Recent Labs  Lab 05/07/24 1920 05/07/24 1931 05/08/24 0611 05/09/24 0347 05/10/24 0320  NA 143 147*  147* 145 142 143  K 3.0* 3.0*  3.0* 3.7 3.2* 4.4  CL 113* 112* 114* 109 111  CO2 20*  --  26 21* 24  GLUCOSE 178* 171* 93 73 92  BUN 34* 32* 28* 25* 19  CREATININE 1.02* 1.10* 0.84 0.78 0.80  CALCIUM  8.0*  --  8.1* 8.5* 8.7*  MG 2.1  --  2.1 2.2 2.1  PHOS  --   --  2.6 2.6 2.7    GFR: Estimated Creatinine Clearance: 37.6 mL/min (by C-G formula based on SCr of 0.8 mg/dL).  Liver Function Tests: Recent Labs  Lab 05/07/24 1920 05/08/24 0611  AST 19 16  ALT 13 13  ALKPHOS 71 53  BILITOT 0.4 0.6  PROT 5.5* 5.0*  ALBUMIN 2.4* 2.2*   No results for input(s): LIPASE, AMYLASE in the last 168 hours. No results for input(s): AMMONIA in the last 168 hours.  Coagulation Profile: No results for input(s): INR, PROTIME in the last 168 hours.  Cardiac Enzymes: No results for input(s): CKTOTAL, CKMB, CKMBINDEX, TROPONINI in the last 168 hours.  BNP (last 3 results) No results for input(s): PROBNP in the last 8760 hours.  Lipid Profile: No results for input(s): CHOL, HDL, LDLCALC, TRIG, CHOLHDL, LDLDIRECT in the last 72 hours.  Thyroid  Function Tests: No results for input(s): TSH, T4TOTAL, FREET4, T3FREE, THYROIDAB in the last 72 hours.  Anemia Panel: No results for input(s): VITAMINB12, FOLATE, FERRITIN, TIBC, IRON, RETICCTPCT in the last 72 hours.  Urine analysis:    Component Value Date/Time   COLORURINE YELLOW 05/08/2024 1112   APPEARANCEUR CLOUDY (A) 05/08/2024 1112   LABSPEC 1.020 05/08/2024 1112   PHURINE 6.0 05/08/2024 1112   GLUCOSEU NEGATIVE 05/08/2024 1112   HGBUR TRACE (A) 05/08/2024 1112   BILIRUBINUR NEGATIVE 05/08/2024  1112   BILIRUBINUR + 07/14/2015 1655   KETONESUR NEGATIVE 05/08/2024 1112   PROTEINUR 30 (A) 05/08/2024 1112   UROBILINOGEN negative 07/14/2015 1655   UROBILINOGEN 0.2 05/16/2013 1434   NITRITE NEGATIVE 05/08/2024 1112   LEUKOCYTESUR MODERATE (A) 05/08/2024 1112    Sepsis Labs: Lactic Acid, Venous    Component Value Date/Time   LATICACIDVEN 1.8 05/08/2024 0611    MICROBIOLOGY: Recent Results (from the past 240  hours)  Resp panel by RT-PCR (RSV, Flu A&B, Covid) Anterior Nasal Swab     Status: None   Collection Time: 05/07/24  7:06 PM   Specimen: Anterior Nasal Swab  Result Value Ref Range Status   SARS Coronavirus 2 by RT PCR NEGATIVE NEGATIVE Final   Influenza A by PCR NEGATIVE NEGATIVE Final   Influenza B by PCR NEGATIVE NEGATIVE Final    Comment: (NOTE) The Xpert Xpress SARS-CoV-2/FLU/RSV plus assay is intended as an aid in the diagnosis of influenza from Nasopharyngeal swab specimens and should not be used as a sole basis for treatment. Nasal washings and aspirates are unacceptable for Xpert Xpress SARS-CoV-2/FLU/RSV testing.  Fact Sheet for Patients: bloggercourse.com  Fact Sheet for Healthcare Providers: seriousbroker.it  This test is not yet approved or cleared by the [place]  FDA and has been authorized for detection and/or diagnosis of SARS-CoV-2 by FDA under an Emergency Use Authorization (EUA). This EUA will remain in effect (meaning this test can be used) for the duration of the COVID-19 declaration under Section 564(b)(1) of the Act, 21 U.S.C. section 360bbb-3(b)(1), unless the authorization is terminated or revoked.     Resp Syncytial Virus by PCR NEGATIVE NEGATIVE Final    Comment: (NOTE) Fact Sheet for Patients: bloggercourse.com  Fact Sheet for Healthcare Providers: seriousbroker.it  This test is not yet approved or cleared by the [place]   FDA and has been authorized for detection and/or diagnosis of SARS-CoV-2 by FDA under an Emergency Use Authorization (EUA). This EUA will remain in effect (meaning this test can be used) for the duration of the COVID-19 declaration under Section 564(b)(1) of the Act, 21 U.S.C. section 360bbb-3(b)(1), unless the authorization is terminated or revoked.  Performed at [facility], [address], [place], [place] [address]   Blood culture (routine x 2)     Status: None   Collection Time: 05/07/24  7:20 PM   Specimen: BLOOD LEFT FOREARM  Result Value Ref Range Status   Specimen Description BLOOD LEFT FOREARM  Final   Special Requests   Final    BOTTLES DRAWN AEROBIC AND ANAEROBIC Blood Culture adequate volume   Culture   Final    NO GROWTH 5 DAYS Performed at [facility], [address], [place], [place] [address]    Report Status 05/12/2024 FINAL  Final  Blood culture (routine x 2)     Status: None (Preliminary result)   Collection Time: 05/08/24  6:11 AM   Specimen: BLOOD RIGHT ARM  Result Value Ref Range Status   Specimen Description BLOOD RIGHT ARM  Final   Special Requests   Final    BOTTLES DRAWN AEROBIC AND ANAEROBIC Blood Culture adequate volume   Culture   Final    NO GROWTH 4 DAYS Performed at [facility], [address] [place], [place] [address]    Report Status PENDING  Incomplete  Urine Culture (for pregnant, neutropenic or urologic patients or patients with an indwelling urinary catheter)     Status: None   Collection Time: 05/08/24  1:23 PM   Specimen: Urine, Clean Catch  Result Value Ref Range Status   Specimen Description URINE, CLEAN CATCH  Final   Special Requests NONE  Final   Culture   Final    NO GROWTH Performed at [facility], [address], [place], [place] [address]    Report Status 05/09/2024 FINAL  Final    RADIOLOGY STUDIES/RESULTS: No results found.  LOS: 2 days   Donalda Applebaum, MD  [facility]    To contact the attending provider between 7A-7P or the covering provider during after hours 7P-7A, please log into the web site [email or web address] and access using universal [place] password for that web site. If you do not have the password, please call the hospital operator.  05/12/2024, 8:49 AM

## 2024-05-13 DIAGNOSIS — Z66 Do not resuscitate: Secondary | ICD-10-CM | POA: Diagnosis not present

## 2024-05-13 DIAGNOSIS — E43 Unspecified severe protein-calorie malnutrition: Secondary | ICD-10-CM

## 2024-05-13 DIAGNOSIS — Z7189 Other specified counseling: Secondary | ICD-10-CM | POA: Diagnosis not present

## 2024-05-13 DIAGNOSIS — Z789 Other specified health status: Secondary | ICD-10-CM

## 2024-05-13 DIAGNOSIS — Z515 Encounter for palliative care: Secondary | ICD-10-CM | POA: Diagnosis not present

## 2024-05-13 DIAGNOSIS — K922 Gastrointestinal hemorrhage, unspecified: Secondary | ICD-10-CM | POA: Diagnosis not present

## 2024-05-13 LAB — CBC
HCT: 27.7 % — ABNORMAL LOW (ref 36.0–46.0)
Hemoglobin: 8.2 g/dL — ABNORMAL LOW (ref 12.0–15.0)
MCH: 23.8 pg — ABNORMAL LOW (ref 26.0–34.0)
MCHC: 29.6 g/dL — ABNORMAL LOW (ref 30.0–36.0)
MCV: 80.5 fL (ref 80.0–100.0)
Platelets: 343 K/uL (ref 150–400)
RBC: 3.44 MIL/uL — ABNORMAL LOW (ref 3.87–5.11)
RDW: 16.2 % — ABNORMAL HIGH (ref 11.5–15.5)
WBC: 8.9 K/uL (ref 4.0–10.5)
nRBC: 0 % (ref 0.0–0.2)

## 2024-05-13 LAB — CULTURE, BLOOD (ROUTINE X 2)
Culture: NO GROWTH
Special Requests: ADEQUATE

## 2024-05-13 LAB — GLUCOSE, CAPILLARY: Glucose-Capillary: 99 mg/dL (ref 70–99)

## 2024-05-13 MED ORDER — CHLORHEXIDINE GLUCONATE CLOTH 2 % EX PADS
6.0000 | MEDICATED_PAD | Freq: Every day | CUTANEOUS | Status: DC
  Administered 2024-05-13 – 2024-05-17 (×5): 6 via TOPICAL

## 2024-05-13 NOTE — Progress Notes (Addendum)
 PROGRESS NOTE        PATIENT DETAILS Name: [name] Age: 89 y.o. Sex: female Date of Birth: Dec 27, 1932 Admit Date: 05/07/2024 Admitting Physician Dorothyann CHRISTELLA Helling, MD ERE:[email or web address], Provider Not In  Brief Summary: Patient is a 89 y.o.  female with history of chron's disease, chronic HFpEF, HTN, dementia who presented with sepsis likely secondary to aspiration pneumonia.  Significant events: 11/28>> admit to [facility]-confusion/fever  Significant studies: 11/28>> CXR: Multiple chronic right-sided fractures, moderate to severe left basilar scarring and or atelectasis. 11/28>> CTA chest: No PE 11/28>> CT abdomen/pelvis: Acute diverticulitis in the descending colon. 11/30>> CXR: Small right/moderate left pleural effusion, bibasilar opacities-slightly progressed from prior.  Significant microbiology data: 11/28>> COVID/influenza/RSV PCR: Negative 11/28>> blood culture: No growth 11/29>> blood culture: No growth 11/29>> urine culture: No growth  Procedures: None  Consults: Palliative care  Subjective: Ate 80% of her breakfast this morning-no major issues overnight.  Remains very pleasantly confused.  Objective: Vitals: Blood pressure 121/60, pulse 83, temperature 97.8 F (36.6 C), temperature source Oral, resp. rate 18, height 5' 5 (1.651 m), weight 49 kg, SpO2 100%.   Exam: Pleasant confused Not in any distress Chest: Clear to auscultation-very minimal transmitted upper airway sounds Abdomen: Soft nontender nondistended B/L lower extremity contractures.    Pertinent Labs/Radiology:    Latest Ref Rng & Units 05/13/2024    4:09 AM 05/10/2024    3:20 AM 05/09/2024    3:47 AM  CBC  WBC 4.0 - 10.5 K/uL 8.9  8.3  8.2   Hemoglobin 12.0 - 15.0 g/dL 8.2  8.9  8.9   Hematocrit 36.0 - 46.0 % 27.7  30.1  29.7   Platelets 150 - 400 K/uL 343  339  297     Lab Results  Component Value Date   NA 143 05/10/2024   K 4.4 05/10/2024   CL 111 05/10/2024    CO2 24 05/10/2024      Assessment/Plan: Sepsis secondary to aspiration pneumonia Sepsis physiology improved Zosyn x 7 days planned Lungs sound better today Continue strict aspiration precautions.  Acute respiratory distress secondary to aspiration pneumonia Had increased work of breathing several days back but seems comfortable today SLP following-on dysphagia 1 diet with nectar thick liquids Per prior notes-family okay to resume diet-accepting full risk of aspiration.  Acute diverticulitis Probably an incidental finding-belly exam benign Already covered with IV Zosyn  Lower GI bleeding with acute blood loss anemia No further lower GI bleeding-brown stools per last GI note No plans for endoscopic evaluation per GI Hb stable-did receive 1 unit of PRBC on 11/28 Follow CBC.  Acute metabolic encephalopathy superimposed on dementia Likely secondary to sepsis physiology/aspiration pneumonia Pleasantly confused this morning Continue to treat underlying pneumonia Delirium precautions.  Chronic HFpEF Currently euvolemic  HTN BP stable Continue metoprolol.    Acute urinary retention Developed intermittent urinary retention-however for the past 24 hours or so-has had intermittent In-N-Out catheterization, this afternoon-on in/out cath-600 cc was returned, Foley catheter was then inserted. Blood pressure soft-Will hold off on starting Flomax. Probably will need outpatient voiding trial at SNF.  Chron's disease Budesonide  orally  GERD PPI/Carafate  Borderline B12 deficiency Continue supplementation.  Hypernatremia Resolved  Asymmetric right sided bladder wall thickening (cystitis versus neoplasm) Already treated with empiric antibiotics-but cultures negative Needs outpatient follow-up with urology for cystoscopy if desired by patient/family.  Dementia Continue Aricept /Remeron  Maintain delirium precautions.  Thyroid  nodule 1.6 cm Incidental finding on CT  imaging Not sure if any significant benefit for further workup given advanced age/dementia.  Will defer to PCP.  Palliative care DNR Reviewed last few notes from prior MD and palliative care team-no further escalation in care-allow time for outcomes.  Thankfully she seems to have improved quite a bit-however we will always remain at risk for aspiration episodes in the future.   Nutrition Status: Nutrition Problem: Severe Malnutrition Etiology: chronic illness Signs/Symptoms: severe muscle depletion, severe fat depletion Interventions: Refer to RD note for recommendations, Ensure Enlive (each supplement provides 350kcal and 20 grams of protein), Magic cup, MVI  Pressure Ulcer: Agree with assessment and plan as outlined below. Wound 05/08/24 1449 Pressure Injury Sacrum Lower Deep Tissue Pressure Injury - Purple or maroon localized area of discolored intact skin or blood-filled blister due to damage of underlying soft tissue from pressure and/or shear. (Active)     Wound 05/08/24 1450 Pressure Injury Vertebral column Lower Stage 3 -  Full thickness tissue loss. Subcutaneous fat may be visible but bone, tendon or muscle are NOT exposed. (Active)    Code status:   Code Status: Limited: Do not attempt resuscitation (DNR) -DNR-LIMITED -Do Not Intubate/DNI    DVT Prophylaxis: Place and maintain sequential compression device Start: 05/08/24 0211 Place TED hose Start: 05/07/24 2325   Family Communication: Son-Dr Lael Corbin-[phone] updated 12/4   Disposition Plan: Status is: Inpatient Remains inpatient appropriate because: Severity of illness   Planned Discharge Destination:Skilled nursing facility   Diet: Diet Order             DIET DYS 2 Room service appropriate? No; Fluid consistency: Nectar Thick  Diet effective now                     Antimicrobial agents: Anti-infectives (From admission, onward)    Start     Dose/Rate Route Frequency Ordered Stop   05/09/24  1800  vancomycin (VANCOREADY) IVPB 750 mg/150 mL  Status:  Discontinued        750 mg 150 mL/hr over 60 Minutes Intravenous Every 48 hours 05/08/24 0332 05/08/24 1056   05/08/24 0600  piperacillin-tazobactam (ZOSYN) IVPB 3.375 g        3.375 g 12.5 mL/hr over 240 Minutes Intravenous Every 8 hours 05/08/24 0326     05/07/24 2000  ceFEPIme (MAXIPIME) 2 g in sodium chloride  0.9 % 100 mL IVPB  Status:  Discontinued        2 g 200 mL/hr over 30 Minutes Intravenous Every 24 hours 05/07/24 1948 05/07/24 1955   05/07/24 2000  vancomycin (VANCOCIN) IVPB 1000 mg/200 mL premix        1,000 mg 200 mL/hr over 60 Minutes Intravenous  Once 05/07/24 1950 05/07/24 2225   05/07/24 2000  piperacillin-tazobactam (ZOSYN) IVPB 3.375 g        3.375 g 100 mL/hr over 30 Minutes Intravenous  Once 05/07/24 1956 05/07/24 2101   05/07/24 1945  ceFEPIme (MAXIPIME) 2 g in sodium chloride  0.9 % 100 mL IVPB  Status:  Discontinued        2 g 200 mL/hr over 30 Minutes Intravenous Every 12 hours 05/07/24 1944 05/07/24 1948        MEDICATIONS: Scheduled Meds:  budesonide   9 mg Oral Daily   collagenase   Topical Daily   cyanocobalamin   1,000 mcg Subcutaneous Daily   Followed by   NOREEN ON 05/15/2024] cyanocobalamin   1,000 mcg Subcutaneous Q30 days   docusate sodium   100 mg Oral BID   donepezil   10 mg Oral QHS   ferrous sulfate  325 mg Oral Q breakfast   glycopyrrolate   0.1 mg Intravenous QID   metoprolol tartrate  25 mg Oral BID   mirtazapine   7.5 mg Oral QHS    morphine  injection  1 mg Intravenous Once   multivitamin with minerals  1 tablet Oral Daily   pantoprazole  40 mg Oral Daily   sucralfate  1 g Oral TID AC & HS   thiamine  100 mg Oral Daily   Continuous Infusions:  piperacillin-tazobactam (ZOSYN)  IV 3.375 g (05/13/24 0026)   PRN Meds:.albuterol, guaiFENesin-dextromethorphan, morphine  injection   I have personally reviewed following labs and imaging studies  LABORATORY DATA: CBC: Recent Labs   Lab 05/07/24 1920 05/07/24 1931 05/08/24 0611 05/08/24 1600 05/09/24 0347 05/10/24 0320 05/13/24 0409  WBC 11.3*  --  9.4 10.0 8.2 8.3 8.9  NEUTROABS 9.7*  --   --   --   --   --   --   HGB 7.0*   < > 7.7* 8.6* 8.9* 8.9* 8.2*  HCT 24.8*   < > 25.6* 28.3* 29.7* 30.1* 27.7*  MCV 84.4  --  81.8 81.6 81.6 81.4 80.5  PLT 320  --  252 271 297 339 343   < > = values in this interval not displayed.    Basic Metabolic Panel: Recent Labs  Lab 05/07/24 1920 05/07/24 1931 05/08/24 0611 05/09/24 0347 05/10/24 0320  NA 143 147*  147* 145 142 143  K 3.0* 3.0*  3.0* 3.7 3.2* 4.4  CL 113* 112* 114* 109 111  CO2 20*  --  26 21* 24  GLUCOSE 178* 171* 93 73 92  BUN 34* 32* 28* 25* 19  CREATININE 1.02* 1.10* 0.84 0.78 0.80  CALCIUM  8.0*  --  8.1* 8.5* 8.7*  MG 2.1  --  2.1 2.2 2.1  PHOS  --   --  2.6 2.6 2.7    GFR: Estimated Creatinine Clearance: 37.6 mL/min (by C-G formula based on SCr of 0.8 mg/dL).  Liver Function Tests: Recent Labs  Lab 05/07/24 1920 05/08/24 0611  AST 19 16  ALT 13 13  ALKPHOS 71 53  BILITOT 0.4 0.6  PROT 5.5* 5.0*  ALBUMIN 2.4* 2.2*   No results for input(s): LIPASE, AMYLASE in the last 168 hours. No results for input(s): AMMONIA in the last 168 hours.  Coagulation Profile: No results for input(s): INR, PROTIME in the last 168 hours.  Cardiac Enzymes: No results for input(s): CKTOTAL, CKMB, CKMBINDEX, TROPONINI in the last 168 hours.  BNP (last 3 results) No results for input(s): PROBNP in the last 8760 hours.  Lipid Profile: No results for input(s): CHOL, HDL, LDLCALC, TRIG, CHOLHDL, LDLDIRECT in the last 72 hours.  Thyroid  Function Tests: No results for input(s): TSH, T4TOTAL, FREET4, T3FREE, THYROIDAB in the last 72 hours.  Anemia Panel: No results for input(s): VITAMINB12, FOLATE, FERRITIN, TIBC, IRON, RETICCTPCT in the last 72 hours.  Urine analysis:    Component Value  Date/Time   COLORURINE YELLOW 05/08/2024 1112   APPEARANCEUR CLOUDY (A) 05/08/2024 1112   LABSPEC 1.020 05/08/2024 1112   PHURINE 6.0 05/08/2024 1112   GLUCOSEU NEGATIVE 05/08/2024 1112   HGBUR TRACE (A) 05/08/2024 1112   BILIRUBINUR NEGATIVE 05/08/2024 1112   BILIRUBINUR + 07/14/2015 1655   KETONESUR NEGATIVE 05/08/2024 1112   PROTEINUR 30 (A) 05/08/2024 1112  UROBILINOGEN negative 07/14/2015 1655   UROBILINOGEN 0.2 05/16/2013 1434   NITRITE NEGATIVE 05/08/2024 1112   LEUKOCYTESUR MODERATE (A) 05/08/2024 1112    Sepsis Labs: Lactic Acid, Venous    Component Value Date/Time   LATICACIDVEN 1.8 05/08/2024 9388    MICROBIOLOGY: Recent Results (from the past 240 hours)  Resp panel by RT-PCR (RSV, Flu A&B, Covid) Anterior Nasal Swab     Status: None   Collection Time: 05/07/24  7:06 PM   Specimen: Anterior Nasal Swab  Result Value Ref Range Status   SARS Coronavirus 2 by RT PCR NEGATIVE NEGATIVE Final   Influenza A by PCR NEGATIVE NEGATIVE Final   Influenza B by PCR NEGATIVE NEGATIVE Final    Comment: (NOTE) The Xpert Xpress SARS-CoV-2/FLU/RSV plus assay is intended as an aid in the diagnosis of influenza from Nasopharyngeal swab specimens and should not be used as a sole basis for treatment. Nasal washings and aspirates are unacceptable for Xpert Xpress SARS-CoV-2/FLU/RSV testing.  Fact Sheet for Patients: bloggercourse.com  Fact Sheet for Healthcare Providers: seriousbroker.it  This test is not yet approved or cleared by the [place]  FDA and has been authorized for detection and/or diagnosis of SARS-CoV-2 by FDA under an Emergency Use Authorization (EUA). This EUA will remain in effect (meaning this test can be used) for the duration of the COVID-19 declaration under Section 564(b)(1) of the Act, 21 U.S.C. section 360bbb-3(b)(1), unless the authorization is terminated or revoked.     Resp Syncytial Virus by PCR  NEGATIVE NEGATIVE Final    Comment: (NOTE) Fact Sheet for Patients: bloggercourse.com  Fact Sheet for Healthcare Providers: seriousbroker.it  This test is not yet approved or cleared by the [place]  FDA and has been authorized for detection and/or diagnosis of SARS-CoV-2 by FDA under an Emergency Use Authorization (EUA). This EUA will remain in effect (meaning this test can be used) for the duration of the COVID-19 declaration under Section 564(b)(1) of the Act, 21 U.S.C. section 360bbb-3(b)(1), unless the authorization is terminated or revoked.  Performed at [facility], [address], [place], [place] [address]   Blood culture (routine x 2)     Status: None   Collection Time: 05/07/24  7:20 PM   Specimen: BLOOD LEFT FOREARM  Result Value Ref Range Status   Specimen Description BLOOD LEFT FOREARM  Final   Special Requests   Final    BOTTLES DRAWN AEROBIC AND ANAEROBIC Blood Culture adequate volume   Culture   Final    NO GROWTH 5 DAYS Performed at [facility], [address] [place], [place] [address]    Report Status 05/12/2024 FINAL  Final  Blood culture (routine x 2)     Status: None   Collection Time: 05/08/24  6:11 AM   Specimen: BLOOD RIGHT ARM  Result Value Ref Range Status   Specimen Description BLOOD RIGHT ARM  Final   Special Requests   Final    BOTTLES DRAWN AEROBIC AND ANAEROBIC Blood Culture adequate volume   Culture   Final    NO GROWTH 5 DAYS Performed at [facility], [address] [place], [place] [address]    Report Status 05/13/2024 FINAL  Final  Urine Culture (for pregnant, neutropenic or urologic patients or patients with an indwelling urinary catheter)     Status: None   Collection Time: 05/08/24  1:23 PM   Specimen: Urine, Clean Catch  Result Value Ref Range Status   Specimen Description URINE, CLEAN  CATCH  Final   Special Requests NONE  Final   Culture   Final    NO  GROWTH Performed at [facility], [address] [place], [place] [address]    Report Status 05/09/2024 FINAL  Final    RADIOLOGY STUDIES/RESULTS: No results found.   LOS: 3 days   Donalda Applebaum, MD  [facility]    To contact the attending provider between 7A-7P or the covering provider during after hours 7P-7A, please log into the web site [email or web address] and access using universal [place] password for that web site. If you do not have the password, please call the hospital operator.  05/13/2024, 10:36 AM

## 2024-05-13 NOTE — Care Management Important Message (Signed)
 Important Message  Patient Details  Name: [name] MRN: [id number] Date of Birth: 1932-10-22   Important Message Given:  Yes - Medicare IM     [name] 05/13/2024, 3:37 PM

## 2024-05-13 NOTE — Plan of Care (Signed)
   Problem: Activity: Goal: Risk for activity intolerance will decrease Outcome: Progressing   Problem: Nutrition: Goal: Adequate nutrition will be maintained Outcome: Progressing   Problem: Coping: Goal: Level of anxiety will decrease Outcome: Progressing

## 2024-05-14 ENCOUNTER — Inpatient Hospital Stay (HOSPITAL_COMMUNITY): Payer: Medicare (Managed Care)

## 2024-05-14 DIAGNOSIS — K922 Gastrointestinal hemorrhage, unspecified: Secondary | ICD-10-CM | POA: Diagnosis not present

## 2024-05-14 DIAGNOSIS — Z66 Do not resuscitate: Secondary | ICD-10-CM | POA: Diagnosis not present

## 2024-05-14 DIAGNOSIS — Z7189 Other specified counseling: Secondary | ICD-10-CM | POA: Diagnosis not present

## 2024-05-14 DIAGNOSIS — Z515 Encounter for palliative care: Secondary | ICD-10-CM | POA: Diagnosis not present

## 2024-05-14 LAB — BASIC METABOLIC PANEL WITH GFR
Anion gap: 8 (ref 5–15)
BUN: 14 mg/dL (ref 8–23)
CO2: 21 mmol/L — ABNORMAL LOW (ref 22–32)
Calcium: 8.1 mg/dL — ABNORMAL LOW (ref 8.9–10.3)
Chloride: 116 mmol/L — ABNORMAL HIGH (ref 98–111)
Creatinine, Ser: 0.78 mg/dL (ref 0.44–1.00)
GFR, Estimated: >60 mL/min (ref >60)
Glucose, Bld: 87 mg/dL (ref 70–99)
Potassium: 3.1 mmol/L — ABNORMAL LOW (ref 3.5–5.1)
Sodium: 145 mmol/L (ref 135–145)

## 2024-05-14 LAB — TROPONIN I (HIGH SENSITIVITY)
Troponin I (High Sensitivity): 14 ng/L (ref <18)
Troponin I (High Sensitivity): 13 ng/L (ref <18)

## 2024-05-14 MED ORDER — ENSURE PLUS HIGH PROTEIN PO LIQD
237.0000 mL | Freq: Two times a day (BID) | ORAL | Status: DC
  Administered 2024-05-14 – 2024-05-17 (×5): 237 mL via ORAL

## 2024-05-14 MED ORDER — POTASSIUM CHLORIDE CRYS ER 20 MEQ PO TBCR
40.0000 meq | EXTENDED_RELEASE_TABLET | Freq: Once | ORAL | Status: AC
  Administered 2024-05-14: 40 meq via ORAL
  Filled 2024-05-14: qty 2

## 2024-05-14 NOTE — Progress Notes (Signed)
 PROGRESS NOTE        PATIENT DETAILS Name: [name] Age: 89 y.o. Sex: female Date of Birth: 07-15-1932 Admit Date: 05/07/2024 Admitting Physician Dorothyann CHRISTELLA Helling, MD ERE:[email or web address], Provider Not In  Brief Summary: Patient is a 89 y.o.  female with history of chron's disease, chronic HFpEF, HTN, dementia who presented with sepsis likely secondary to aspiration pneumonia.  Significant events: 11/28>> admit to [facility]-confusion/fever 12/04>> recurrent urinary retention-Foley catheter inserted  Significant studies: 11/28>> CXR: Multiple chronic right-sided fractures, moderate to severe left basilar scarring and or atelectasis. 11/28>> CTA chest: No PE 11/28>> CT abdomen/pelvis: Acute diverticulitis in the descending colon. 11/30>> CXR: Small right/moderate left pleural effusion, bibasilar opacities-slightly progressed from prior.  Significant microbiology data: 11/28>> COVID/influenza/RSV PCR: Negative 11/28>> blood culture: No growth 11/29>> blood culture: No growth 11/29>> urine culture: No growth  Procedures: None  Consults: Palliative care  Subjective: No major issues overnight-lying comfortably in bed.  Objective: Vitals: Blood pressure 119/72, pulse 86, temperature 98.2 F (36.8 C), temperature source Oral, resp. rate 18, height 5' 5 (1.651 m), weight 49 kg, SpO2 100%.   Exam: Pleasantly confused Not in any distress Chest: Clear to auscultation this morning-hardly any transmitted upper airway sounds Abdomen: Soft nontender nondistended No edema B/L lower extremity contractures.  Pertinent Labs/Radiology:    Latest Ref Rng & Units 05/13/2024    4:09 AM 05/10/2024    3:20 AM 05/09/2024    3:47 AM  CBC  WBC 4.0 - 10.5 K/uL 8.9  8.3  8.2   Hemoglobin 12.0 - 15.0 g/dL 8.2  8.9  8.9   Hematocrit 36.0 - 46.0 % 27.7  30.1  29.7   Platelets 150 - 400 K/uL 343  339  297     Lab Results  Component Value Date   NA 145 05/14/2024   K  3.1 (L) 05/14/2024   CL 116 (H) 05/14/2024   CO2 21 (L) 05/14/2024      Assessment/Plan: Sepsis secondary to aspiration pneumonia Sepsis physiology improved Zosyn  x 7 days planned-EOT 12/5 Continue strict aspiration precautions.  Acute respiratory distress secondary to aspiration pneumonia Resolved-on room air this morning. SLP following-on dysphagia 1 diet with nectar thick liquids Per prior notes-family okay to resume diet-accepting full risk of aspiration.  Acute diverticulitis Probably an incidental finding-belly exam benign Already covered with IV Zosyn   Lower GI bleeding with acute blood loss anemia No further lower GI bleeding-brown stools per last GI note No plans for endoscopic evaluation per GI Hb stable-did receive 1 unit of PRBC on 11/28 Follow CBC.  Acute metabolic encephalopathy superimposed on dementia Likely secondary to sepsis physiology/aspiration pneumonia Pleasantly confused this morning Continue to treat underlying pneumonia Delirium precautions.  Chronic HFpEF Currently euvolemic  HTN BP stable Continue metoprolol .    Hypokalemia Replete/recheck  Acute urinary retention Developed recurrent urinary retention-s/p numerous in/out catheterization-subsequently required Foley catheter insertion on 12/5 (600 cc returned)  Blood pressure too soft to tolerate Flomax Keep Foley catheter in place-Will likely require outpatient voiding trial.    Cron's disease Seems stable Budesonide  orally  GERD PPI/Carafate   Borderline B12 deficiency Continue supplementation.  Hypernatremia Resolved  Asymmetric right sided bladder wall thickening (cystitis versus neoplasm) Already treated with empiric antibiotics-but cultures negative Needs outpatient follow-up with urology for cystoscopy if desired by patient/family.  Dementia Continue Aricept /Remeron  Maintain delirium precautions.  Thyroid  nodule  1.6 cm Incidental finding on CT imaging Not sure if  any significant benefit for further workup given advanced age/dementia.  Will defer to PCP.  Palliative care DNR Reviewed last few notes from prior MD and palliative care team-no further escalation in care-allow time for outcomes.  Thankfully she seems to have improved quite a bit-however we will always remain at risk for aspiration episodes in the future.   Nutrition Status: Nutrition Problem: Severe Malnutrition Etiology: chronic illness Signs/Symptoms: severe muscle depletion, severe fat depletion Interventions: Refer to RD note for recommendations, Ensure Enlive (each supplement provides 350kcal and 20 grams of protein), Magic cup, MVI  Pressure Ulcer: Agree with assessment and plan as outlined below. Wound 05/08/24 1449 Pressure Injury Sacrum Lower Deep Tissue Pressure Injury - Purple or maroon localized area of discolored intact skin or blood-filled blister due to damage of underlying soft tissue from pressure and/or shear. (Active)     Wound 05/08/24 1450 Pressure Injury Vertebral column Lower Stage 3 -  Full thickness tissue loss. Subcutaneous fat may be visible but bone, tendon or muscle are NOT exposed. (Active)    Code status:   Code Status: Limited: Do not attempt resuscitation (DNR) -DNR-LIMITED -Do Not Intubate/DNI    DVT Prophylaxis: Place and maintain sequential compression device Start: 05/08/24 0211 Place TED hose Start: 05/07/24 2325   Family Communication: Son-Dr Lael [phone] left voicemail on 12/5   Disposition Plan: Status is: Inpatient Remains inpatient appropriate because: Severity of illness   Planned Discharge Destination:Skilled nursing facility   Diet: Diet Order             DIET DYS 2 Room service appropriate? No; Fluid consistency: Nectar Thick  Diet effective now                     Antimicrobial agents: Anti-infectives (From admission, onward)    Start     Dose/Rate Route Frequency Ordered Stop   05/09/24 1800   vancomycin  (VANCOREADY) IVPB 750 mg/150 mL  Status:  Discontinued        750 mg 150 mL/hr over 60 Minutes Intravenous Every 48 hours 05/08/24 0332 05/08/24 1056   05/08/24 0600  piperacillin -tazobactam (ZOSYN ) IVPB 3.375 g        3.375 g 12.5 mL/hr over 240 Minutes Intravenous Every 8 hours 05/08/24 0326 05/14/24 1400   05/07/24 2000  ceFEPIme  (MAXIPIME ) 2 g in sodium chloride  0.9 % 100 mL IVPB  Status:  Discontinued        2 g 200 mL/hr over 30 Minutes Intravenous Every 24 hours 05/07/24 1948 05/07/24 1955   05/07/24 2000  vancomycin  (VANCOCIN ) IVPB 1000 mg/200 mL premix        1,000 mg 200 mL/hr over 60 Minutes Intravenous  Once 05/07/24 1950 05/07/24 2225   05/07/24 2000  piperacillin -tazobactam (ZOSYN ) IVPB 3.375 g        3.375 g 100 mL/hr over 30 Minutes Intravenous  Once 05/07/24 1956 05/07/24 2101   05/07/24 1945  ceFEPIme  (MAXIPIME ) 2 g in sodium chloride  0.9 % 100 mL IVPB  Status:  Discontinued        2 g 200 mL/hr over 30 Minutes Intravenous Every 12 hours 05/07/24 1944 05/07/24 1948        MEDICATIONS: Scheduled Meds:  budesonide   9 mg Oral Daily   Chlorhexidine  Gluconate Cloth  6 each Topical Daily   collagenase    Topical Daily   cyanocobalamin   1,000 mcg Subcutaneous Daily   Followed by   NOREEN  ON 05/15/2024] cyanocobalamin   1,000 mcg Subcutaneous Q30 days   docusate sodium   100 mg Oral BID   donepezil   10 mg Oral QHS   ferrous sulfate   325 mg Oral Q breakfast   glycopyrrolate   0.1 mg Intravenous QID   metoprolol  tartrate  25 mg Oral BID   mirtazapine   7.5 mg Oral QHS    morphine  injection  1 mg Intravenous Once   multivitamin with minerals  1 tablet Oral Daily   pantoprazole   40 mg Oral Daily   sucralfate   1 g Oral TID AC & HS   thiamine   100 mg Oral Daily   Continuous Infusions:  piperacillin -tazobactam (ZOSYN )  IV 3.375 g (05/14/24 0547)   PRN Meds:.albuterol , guaiFENesin -dextromethorphan , morphine  injection   I have personally reviewed following labs  and imaging studies  LABORATORY DATA: CBC: Recent Labs  Lab 05/07/24 1920 05/07/24 1931 05/08/24 0611 05/08/24 1600 05/09/24 0347 05/10/24 0320 05/13/24 0409  WBC 11.3*  --  9.4 10.0 8.2 8.3 8.9  NEUTROABS 9.7*  --   --   --   --   --   --   HGB 7.0*   < > 7.7* 8.6* 8.9* 8.9* 8.2*  HCT 24.8*   < > 25.6* 28.3* 29.7* 30.1* 27.7*  MCV 84.4  --  81.8 81.6 81.6 81.4 80.5  PLT 320  --  252 271 297 339 343   < > = values in this interval not displayed.    Basic Metabolic Panel: Recent Labs  Lab 05/07/24 1920 05/07/24 1931 05/08/24 0611 05/09/24 0347 05/10/24 0320 05/14/24 0443  NA 143 147*  147* 145 142 143 145  K 3.0* 3.0*  3.0* 3.7 3.2* 4.4 3.1*  CL 113* 112* 114* 109 111 116*  CO2 20*  --  26 21* 24 21*  GLUCOSE 178* 171* 93 73 92 87  BUN 34* 32* 28* 25* 19 14  CREATININE 1.02* 1.10* 0.84 0.78 0.80 0.78  CALCIUM  8.0*  --  8.1* 8.5* 8.7* 8.1*  MG 2.1  --  2.1 2.2 2.1  --   PHOS  --   --  2.6 2.6 2.7  --     GFR: Estimated Creatinine Clearance: 37.6 mL/min (by C-G formula based on SCr of 0.78 mg/dL).  Liver Function Tests: Recent Labs  Lab 05/07/24 1920 05/08/24 0611  AST 19 16  ALT 13 13  ALKPHOS 71 53  BILITOT 0.4 0.6  PROT 5.5* 5.0*  ALBUMIN 2.4* 2.2*   No results for input(s): LIPASE, AMYLASE in the last 168 hours. No results for input(s): AMMONIA in the last 168 hours.  Coagulation Profile: No results for input(s): INR, PROTIME in the last 168 hours.  Cardiac Enzymes: No results for input(s): CKTOTAL, CKMB, CKMBINDEX, TROPONINI in the last 168 hours.  BNP (last 3 results) No results for input(s): PROBNP in the last 8760 hours.  Lipid Profile: No results for input(s): CHOL, HDL, LDLCALC, TRIG, CHOLHDL, LDLDIRECT in the last 72 hours.  Thyroid  Function Tests: No results for input(s): TSH, T4TOTAL, FREET4, T3FREE, THYROIDAB in the last 72 hours.  Anemia Panel: No results for input(s): VITAMINB12,  FOLATE, FERRITIN, TIBC, IRON, RETICCTPCT in the last 72 hours.  Urine analysis:    Component Value Date/Time   COLORURINE YELLOW 05/08/2024 1112   APPEARANCEUR CLOUDY (A) 05/08/2024 1112   LABSPEC 1.020 05/08/2024 1112   PHURINE 6.0 05/08/2024 1112   GLUCOSEU NEGATIVE 05/08/2024 1112   HGBUR TRACE (A) 05/08/2024 1112   BILIRUBINUR NEGATIVE 05/08/2024  1112   BILIRUBINUR + 07/14/2015 1655   KETONESUR NEGATIVE 05/08/2024 1112   PROTEINUR 30 (A) 05/08/2024 1112   UROBILINOGEN negative 07/14/2015 1655   UROBILINOGEN 0.2 05/16/2013 1434   NITRITE NEGATIVE 05/08/2024 1112   LEUKOCYTESUR MODERATE (A) 05/08/2024 1112    Sepsis Labs: Lactic Acid, Venous    Component Value Date/Time   LATICACIDVEN 1.8 05/08/2024 9388    MICROBIOLOGY: Recent Results (from the past 240 hours)  Resp panel by RT-PCR (RSV, Flu A&B, Covid) Anterior Nasal Swab     Status: None   Collection Time: 05/07/24  7:06 PM   Specimen: Anterior Nasal Swab  Result Value Ref Range Status   SARS Coronavirus 2 by RT PCR NEGATIVE NEGATIVE Final   Influenza A by PCR NEGATIVE NEGATIVE Final   Influenza B by PCR NEGATIVE NEGATIVE Final    Comment: (NOTE) The Xpert Xpress SARS-CoV-2/FLU/RSV plus assay is intended as an aid in the diagnosis of influenza from Nasopharyngeal swab specimens and should not be used as a sole basis for treatment. Nasal washings and aspirates are unacceptable for Xpert Xpress SARS-CoV-2/FLU/RSV testing.  Fact Sheet for Patients: bloggercourse.com  Fact Sheet for Healthcare Providers: seriousbroker.it  This test is not yet approved or cleared by the [place]  FDA and has been authorized for detection and/or diagnosis of SARS-CoV-2 by FDA under an Emergency Use Authorization (EUA). This EUA will remain in effect (meaning this test can be used) for the duration of the COVID-19 declaration under Section 564(b)(1) of the Act, 21  U.S.C. section 360bbb-3(b)(1), unless the authorization is terminated or revoked.     Resp Syncytial Virus by PCR NEGATIVE NEGATIVE Final    Comment: (NOTE) Fact Sheet for Patients: bloggercourse.com  Fact Sheet for Healthcare Providers: seriousbroker.it  This test is not yet approved or cleared by the [place]  FDA and has been authorized for detection and/or diagnosis of SARS-CoV-2 by FDA under an Emergency Use Authorization (EUA). This EUA will remain in effect (meaning this test can be used) for the duration of the COVID-19 declaration under Section 564(b)(1) of the Act, 21 U.S.C. section 360bbb-3(b)(1), unless the authorization is terminated or revoked.  Performed at [facility], [address] [place], [place] [address]   Blood culture (routine x 2)     Status: None   Collection Time: 05/07/24  7:20 PM   Specimen: BLOOD LEFT FOREARM  Result Value Ref Range Status   Specimen Description BLOOD LEFT FOREARM  Final   Special Requests   Final    BOTTLES DRAWN AEROBIC AND ANAEROBIC Blood Culture adequate volume   Culture   Final    NO GROWTH 5 DAYS Performed at [facility], [address] [place], [place] [address]    Report Status 05/12/2024 FINAL  Final  Blood culture (routine x 2)     Status: None   Collection Time: 05/08/24  6:11 AM   Specimen: BLOOD RIGHT ARM  Result Value Ref Range Status   Specimen Description BLOOD RIGHT ARM  Final   Special Requests   Final    BOTTLES DRAWN AEROBIC AND ANAEROBIC Blood Culture adequate volume   Culture   Final    NO GROWTH 5 DAYS Performed at [facility], [address] [place], [place] [address]    Report Status 05/13/2024 FINAL  Final  Urine Culture (for pregnant, neutropenic or urologic patients or patients with an indwelling urinary catheter)     Status: None   Collection Time:  05/08/24  1:23 PM   Specimen: Urine, Clean Catch  Result Value Ref  Range Status   Specimen Description URINE, CLEAN CATCH  Final   Special Requests NONE  Final   Culture   Final    NO GROWTH Performed at [facility], [address], [place], [place] [address]    Report Status 05/09/2024 FINAL  Final    RADIOLOGY STUDIES/RESULTS: No results found.   LOS: 4 days   Donalda Applebaum, MD  [facility]    To contact the attending provider between 7A-7P or the covering provider during after hours 7P-7A, please log into the web site [email or web address] and access using universal [place] password for that web site. If you do not have the password, please call the hospital operator.  05/14/2024, 10:05 AM

## 2024-05-14 NOTE — Progress Notes (Signed)
 Speech Language Pathology Treatment: Dysphagia  Patient Details Name: [name] MRN: [id number] DOB: Sep 30, 1932 Today's Date: 05/14/2024 Time: 8654-8641 SLP Time Calculation (min) (ACUTE ONLY): 13 min  Assessment / Plan / Recommendation Clinical Impression  Followed up for po observation and family education. Daughter present and pleased with overall progress since Monday. Reported she is eating Dys 2 minced without difficulty and agrees this consistency is appropriate for her. She stated she drank lots of nectar thick water today and did not report difficulty. Pt observed with magic cup tolerated without difficulty and nectar thick water with delayed coughs x 2. Educated daughter pt likely having intermittent aspiration and has been discussed with son via this SLP and Palliative care meetings with son and have stated accepting risks of aspiration. Discussed with daughter that family can make decision for thin liquids if desired in the future continuing to accept risks. Pt states she coughs with thin liquids and while she coughs some with nectar this may be increased with thin which would be expected. When thin liquids are recommended for pt's who are known aspirators it is for comfort however coughing can be excessive causing pt more discomfort. In this situation sometimes thick liquids are continued for this reason however family can make this choice if desired in the future.  Recommend continue Dys 2 (minced), nectar thick, education complete and ST will sign off at this time.    HPI HPI: [name] is a 89 y.o. female admitted from SNF confusion and fever. Found to have GI bleed, sepsis, acute metabolic encephalopathy. CT chest small pleural effusions with adjacent consolidation or atelectasis in the lower lobes. There is a patulous upper thoracic esophagus above the carina level with fluid filling. CXR 11/30 bibasilar opacities, slightly progressed from prior, which may reflect atelectasis  and/or pneumonia. BSE 12/1 pt with s/s aspiration, MD spoke with son who reported he is aware of swallowing difficulties and pt on thick liquids at SNF. ST rec'd regular, nectar thick and no further needs, ST signed off. Repeat CXR showed  Bibasilar opacities, slightly progressed from prior, which may reflect atelectasis and/or pneumonia. MD made pt NPO, after dicussion with son resumed po's, ordering puree and pudding thick liquids and reordered ST for diet recommendations.    PMH: dementia, CHF, Crohn's disease, diverticulosis, hyperparathyroidism s/p prior parathyroidectomy, HTN, HLD, osteoporosis, vitamin D  deficiency, CKD 3, protein calorie malnutrition. (P)      SLP Plan  Discharge SLP treatment due to (comment);Other (Comment) (all education completed)        Swallow Evaluation Recommendations   Recommendations: PO diet PO Diet Recommendation: Dysphagia 2 (Finely chopped);Mildly thick liquids (Level 2, nectar thick) Liquid Administration via: Cup;Straw Medication Administration: Crushed with puree Supervision: Staff to assist with self-feeding;Full supervision/cueing for swallowing strategies Postural changes: Position pt fully upright for meals Oral care recommendations: Oral care BID (2x/day)     Recommendations                     Oral care BID   Frequent or constant Supervision/Assistance Dysphagia, unspecified (R13.10)     Discharge SLP treatment due to (comment);Other (Comment) (all education completed)     [name]  05/14/2024, 2:03 PM

## 2024-05-14 NOTE — Progress Notes (Signed)
 Daily Progress Note   Date: 05/14/2024   Patient Name: [name]  DOB: Feb 17, 1933  MRN: [id number]  Age / Sex: 89 y.o., female  Attending Physician: Raenelle Donalda HERO, MD Primary Care Physician: System, Provider Not In Admit Date: 05/07/2024 Length of Stay: 4 days  Reason for Follow-up: Establishing goals of care  Past Medical History:  Diagnosis Date   Acute diastolic (congestive) heart failure (HCC)    Bladder prolapse, female, acquired    pessary in   Chronic diastolic (congestive) heart failure (HCC)    Chronic pain syndrome    Complete uterine prolapse    Crohn's disease (HCC) 2010   Diverticulosis    E. coli UTI 06/09/2013   Fall 05/2013   Hemorrhoids    History of hypercalcemia    History of hyperglycemia    Hydronephrosis    Hyperparathyroidism    Hypertension    Multiple pelvic fractures (HCC) 07/02/2013   Osteoporosis    Pubic ramus fracture (HCC)    Urinary incontinence 09/09/2011   Vitamin D  deficiency     Subjective:   Subjective: Chart Reviewed. Updates received. Patient Assessed. Created space and opportunity for patient  and family to explore thoughts and feelings regarding current medical situation.  Today's Discussion: Today before meeting with the patient/family, I reviewed the chart notes including nursing note from yesterday, internal medicine note from yesterday, nursing note from today, internal medicine note from today. I also reviewed vital signs, nursing flowsheets, medication administrations record, labs, and imaging.   Today I saw the patient at bedside, she is lying in bed sleeping.  However, she easily awakens to voice and touch.  She makes and keeps eye contact, is very polite and pleasantly confused.  She denies pain, nausea, vomiting, dyspnea.  Per notes she ate 80% of her breakfast today.  She does not have any complaints.  After seeing the patient I attempted to call her son/HCPOA but was unsuccessful.  Previous conversations with  family include recommendation for outpatient palliative care at discharge with anticipated decline over time due to increased risk of recurrent aspiration and aspiration pneumonia.  Family was previously agreeable to this.  I provided contact information for the palliative medicine team for any questions or concerns while the patient remains admitted. I provided emotional and general support through therapeutic listening, empathy, sharing of stories, and other techniques. I answered all questions and addressed all concerns to the best of my ability.  Review of Systems  Respiratory:  Negative for shortness of breath.   Gastrointestinal:  Negative for abdominal pain, nausea and vomiting.    Objective:   Primary Diagnoses: Present on Admission:  GIB (gastrointestinal bleeding)   Vital Signs:  BP 119/72 (BP Location: Right Leg)   Pulse 86   Temp 98.2 F (36.8 C) (Oral)   Resp 18   Ht 5' 5 (1.651 m)   Wt 49 kg   SpO2 100%   BMI 17.98 kg/m   Physical Exam Vitals and nursing note reviewed.  Constitutional:      General: She is sleeping. She is not in acute distress.    Appearance: She is ill-appearing.  HENT:     Head: Normocephalic and atraumatic.  Cardiovascular:     Rate and Rhythm: Normal rate.  Pulmonary:     Effort: Pulmonary effort is normal. No respiratory distress.  Abdominal:     General: Abdomen is flat.  Skin:    General: Skin is warm and dry.  Neurological:  Mental Status: She is easily aroused.  Psychiatric:        Mood and Affect: Mood normal.        Behavior: Behavior normal.     Palliative Assessment/Data: 30-40%   Existing Vynca/ACP Documentation: MOST form signed 07/17/2020  Assessment & Plan:   HPI/Patient Profile:  89 y.o. female  with past medical history of CHF, Crohn's disease, diverticulosis, uterovaginal prolapse with pessary, hemorrhoids, hyperparathyroidism s/p prior parathyroidectomy, HTN, HLD, osteoporosis, vitamin D  deficiency, CKD  3, protein calorie malnutrition.  She was admitted on 05/07/2024 with sepsis likely multifactorial in setting of acute diverticulitis and UTI; aspiration pneumonia, respiratory distress from aspiration pneumonia, BRBPR with Hemoccult positive stool, acute metabolic encephalopathy on underlying dementia, severe protein calorie malnutrition, and others.   Palliative medicine was consulted for GOC conversations.  SUMMARY OF RECOMMENDATIONS   DNR-Limited Continue current scope of care Recommended outpatient palliative care at discharge Noted improvement over the past 24 to 48 hours Anticipate discharge in the next 48 hours Palliative medicine will back off at this time Please notify us  of any significant clinical change or new palliative needs in the interim  Symptom Management:  Per primary team Palliative medicine is available to assist as needed  Code Status: DNR - Limited (DNR/DNI)  Prognosis: < 6 months  Discharge Planning: To Be Determined  Discussed with: Patient, family, medical team, nursing team  Thank you for allowing us  to participate in the care of [name] PMT will continue to support holistically.  Time Total: 30 min  Detailed review of medical records (labs, imaging, vital signs), medically appropriate exam, discussed with treatment team, counseling and education to patient, family, & staff, documenting clinical information, medication management, coordination of care  Camellia Kays, NP Palliative Medicine Team  Team Phone # [phone] (Nights/Weekends)  02/06/2021, 8:17 AM

## 2024-05-14 NOTE — Plan of Care (Signed)
?  Problem: Clinical Measurements: ?Goal: Diagnostic test results will improve ?Outcome: Progressing ?Goal: Respiratory complications will improve ?Outcome: Progressing ?  ?Problem: Activity: ?Goal: Risk for activity intolerance will decrease ?Outcome: Progressing ?  ?Problem: Coping: ?Goal: Level of anxiety will decrease ?Outcome: Progressing ?  ?

## 2024-05-14 NOTE — Progress Notes (Signed)
       CROSS COVER NOTE  NAME: [name] MRN: [id number] DOB : 10-23-1932 ATTENDING PHYSICIAN: Raenelle Donalda HERO, MD    Date of Service   05/14/2024   HPI/Events of Note   Subjective: Called to Bedside for patient report of chest pain. Patient reporting chest pain overlying the 3rd to 4th intercostal space of the (L) sternal border that she indicates by pointing to this area. She is unable to characterize or rate the pain secondary to baseline mental status.  Review of Systems  Respiratory:  Positive for shortness of breath.   Cardiovascular:  Positive for chest pain.   HPI: 89 y.o.  female with history of chron's disease, chronic HFpEF, HTN, dementia who presented on 05/07/24 from nursing home with sepsis likely secondary to aspiration pneumonia.   Objective:   Vitals:   05/13/24 1639 05/13/24 2000 05/14/24 0905 05/14/24 1200  BP: (!) 90/50  119/72 121/69  Pulse: 86 98 86 89  Resp:      Temp: 98.8 F (37.1 C) 98.7 F (37.1 C) 98.2 F (36.8 C) 98 F (36.7 C)  TempSrc: Oral Oral Oral Oral  SpO2:      Weight:      Height:        Physical Exam Vitals and nursing note reviewed.  Cardiovascular:     Rate and Rhythm: Normal rate and regular rhythm.     Pulses: Normal pulses.     Heart sounds: No murmur heard.    No friction rub. No gallop.  Pulmonary:     Effort: Tachypnea present.     Breath sounds: Normal breath sounds. No transmitted upper airway sounds.  Abdominal:     General: There is no distension.     Palpations: Abdomen is soft.     Tenderness: There is no abdominal tenderness.  Musculoskeletal:        General: No tenderness.     Comments: Chest pain not reproducible on exam  Skin:    General: Skin is warm and dry.     Capillary Refill: Capillary refill takes less than 2 seconds.  Neurological:     General: No focal deficit present.     Mental Status: She is alert. Mental status is at baseline.       Interventions    Assessment/Plan:  ##Undifferentiated Chest Pain PRN Morphine  PRN Albuterol  EKG  Monitor on Telemetry CXR Troponin      To reach the provider On-Call:   7AM- 7PM see care teams to locate the attending and reach out to them via [email or web address] Password: [email or web address] 7PM-7AM contact night-coverage If you still have difficulty reaching the appropriate provider, please page the [facility] (Director on Call) for [facility] on amion for assistance  This document was prepared using Conservation officer, historic buildings and may include unintentional dictation errors.  Rockie Rams  FNP-BC, PMHNP-BC Nurse Practitioner [facility]

## 2024-05-15 ENCOUNTER — Inpatient Hospital Stay (HOSPITAL_COMMUNITY): Payer: Medicare (Managed Care)

## 2024-05-15 DIAGNOSIS — Z66 Do not resuscitate: Secondary | ICD-10-CM | POA: Diagnosis not present

## 2024-05-15 DIAGNOSIS — Z7189 Other specified counseling: Secondary | ICD-10-CM | POA: Diagnosis not present

## 2024-05-15 DIAGNOSIS — Z515 Encounter for palliative care: Secondary | ICD-10-CM | POA: Diagnosis not present

## 2024-05-15 DIAGNOSIS — K922 Gastrointestinal hemorrhage, unspecified: Secondary | ICD-10-CM | POA: Diagnosis not present

## 2024-05-15 LAB — CBC
HCT: 27.2 % — ABNORMAL LOW (ref 36.0–46.0)
Hemoglobin: 7.9 g/dL — ABNORMAL LOW (ref 12.0–15.0)
MCH: 23.9 pg — ABNORMAL LOW (ref 26.0–34.0)
MCHC: 29 g/dL — ABNORMAL LOW (ref 30.0–36.0)
MCV: 82.2 fL (ref 80.0–100.0)
Platelets: 294 K/uL (ref 150–400)
RBC: 3.31 MIL/uL — ABNORMAL LOW (ref 3.87–5.11)
RDW: 16.2 % — ABNORMAL HIGH (ref 11.5–15.5)
WBC: 5.6 K/uL (ref 4.0–10.5)
nRBC: 0 % (ref 0.0–0.2)

## 2024-05-15 LAB — BASIC METABOLIC PANEL WITH GFR
Anion gap: 10 (ref 5–15)
BUN: 14 mg/dL (ref 8–23)
CO2: 20 mmol/L — ABNORMAL LOW (ref 22–32)
Calcium: 8.6 mg/dL — ABNORMAL LOW (ref 8.9–10.3)
Chloride: 113 mmol/L — ABNORMAL HIGH (ref 98–111)
Creatinine, Ser: 0.63 mg/dL (ref 0.44–1.00)
GFR, Estimated: >60 mL/min (ref >60)
Glucose, Bld: 98 mg/dL (ref 70–99)
Potassium: 3.7 mmol/L (ref 3.5–5.1)
Sodium: 143 mmol/L (ref 135–145)

## 2024-05-15 LAB — PROCALCITONIN: Procalcitonin: <0.1 ng/mL

## 2024-05-15 LAB — BRAIN NATRIURETIC PEPTIDE: B Natriuretic Peptide: 588.7 pg/mL — ABNORMAL HIGH (ref 0.0–100.0)

## 2024-05-15 MED ORDER — FUROSEMIDE 10 MG/ML IJ SOLN
20.0000 mg | Freq: Once | INTRAMUSCULAR | Status: AC
  Administered 2024-05-15: 20 mg via INTRAVENOUS
  Filled 2024-05-15: qty 2

## 2024-05-15 NOTE — Progress Notes (Signed)
 PROGRESS NOTE        PATIENT DETAILS Name: [name] Age: 89 y.o. Sex: female Date of Birth: [birth year] Admit Date: 05/07/2024 Admitting Physician Dorothyann CHRISTELLA Helling, MD ERE:[email or web address], Provider Not In  Brief Summary: Patient is a 89 y.o.  female with history of chron's disease, chronic HFpEF, HTN, dementia who presented with sepsis likely secondary to aspiration pneumonia.  Significant events: 11/28>> admit to [facility]-confusion/fever 12/04>> recurrent urinary retention-Foley catheter inserted  Significant studies: 11/28>> CXR: Multiple chronic right-sided fractures, moderate to severe left basilar scarring and or atelectasis. 11/28>> CTA chest: No PE 11/28>> CT abdomen/pelvis: Acute diverticulitis in the descending colon. 11/30>> CXR: Small right/moderate left pleural effusion, bibasilar opacities-slightly progressed from prior.  Significant microbiology data: 11/28>> COVID/influenza/RSV PCR: Negative 11/28>> blood culture: No growth 11/29>> blood culture: No growth 11/29>> urine culture: No growth  Procedures: None  Consults: Palliative care  Subjective: Slightly more tachypneic today but comfortable-on room air.  Objective: Vitals: Blood pressure (!) (P) 142/79, pulse 89, temperature (P) 98.3 F (36.8 C), temperature source (P) Oral, resp. rate 18, height 5' 5 (1.651 m), weight 49 kg, SpO2 100%.   Exam: Pleasantly confused Chest: Fine bibasilar rales Abdomen: Soft nontender Extremities: No edema B/L lower extremity contractures.  Pertinent Labs/Radiology:    Latest Ref Rng & Units 05/15/2024    8:19 AM 05/13/2024    4:09 AM 05/10/2024    3:20 AM  CBC  WBC 4.0 - 10.5 K/uL 5.6  8.9  8.3   Hemoglobin 12.0 - 15.0 g/dL 7.9  8.2  8.9   Hematocrit 36.0 - 46.0 % 27.2  27.7  30.1   Platelets 150 - 400 K/uL 294  343  339     Lab Results  Component Value Date   NA 143 05/15/2024   K 3.7 05/15/2024   CL 113 (H) 05/15/2024   CO2 20 (L)  05/15/2024      Assessment/Plan: Sepsis secondary to aspiration pneumonia Sepsis physiology improved Zosyn  x 7 days planned-EOT 12/5 Continue strict aspiration precautions.  Acute respiratory distress secondary to aspiration pneumonia versus HFpEF exacerbation More tachypneic this morning  BNP elevated-chest x-ray with some evidence of pulmonary congestion Not sure if this is CHF or ongoing aspiration pneumonitis Patient just completed antibiotics 12/5-Will give 1 dose of IV Lasix  and see how she does Obtain echo. Per prior notes-family okay to resume diet-accepting full risk of aspiration.  Acute diverticulitis Probably an incidental finding-belly exam benign Already covered with IV Zosyn   Lower GI bleeding with acute blood loss anemia No further lower GI bleeding-brown stools per last GI note No plans for endoscopic evaluation per GI Hb stable-did receive 1 unit of PRBC on 11/28 Follow CBC.  Acute metabolic encephalopathy superimposed on dementia Likely secondary to sepsis physiology/aspiration pneumonia Pleasantly confused this morning Continue to treat underlying pneumonia Delirium precautions.  Chronic HFpEF Currently euvolemic  HTN BP stable Continue metoprolol .    Hypokalemia Replete/recheck  Acute urinary retention Developed recurrent urinary retention-s/p numerous in/out catheterization-subsequently required Foley catheter insertion on 12/5 (600 cc returned)  Blood pressure too soft to tolerate Flomax  Keep Foley catheter in place-Will likely require outpatient voiding trial.    Cron's disease Seems stable Budesonide  orally  GERD PPI/Carafate   Borderline B12 deficiency Continue supplementation.  Hypernatremia Resolved  Asymmetric right sided bladder wall thickening (cystitis versus neoplasm) Already treated with empiric  antibiotics-but cultures negative Needs outpatient follow-up with urology for cystoscopy if desired by  patient/family.  Dementia Continue Aricept /Remeron  Maintain delirium precautions.  Thyroid  nodule 1.6 cm Incidental finding on CT imaging Not sure if any significant benefit for further workup given advanced age/dementia.  Will defer to PCP.  Palliative care DNR Reviewed last few notes from prior MD and palliative care team-no further escalation in care-allow time for outcomes.  Thankfully she seems to have improved quite a bit-however we will always remain at risk for aspiration episodes in the future.   Nutrition Status: Nutrition Problem: Severe Malnutrition Etiology: chronic illness Signs/Symptoms: severe muscle depletion, severe fat depletion Interventions: Refer to RD note for recommendations, Ensure Enlive (each supplement provides 350kcal and 20 grams of protein), Magic cup, MVI  Pressure Ulcer: Agree with assessment and plan as outlined below. Wound 05/08/24 1449 Pressure Injury Sacrum Lower Deep Tissue Pressure Injury - Purple or maroon localized area of discolored intact skin or blood-filled blister due to damage of underlying soft tissue from pressure and/or shear. (Active)     Wound 05/08/24 1450 Pressure Injury Vertebral column Lower Stage 3 -  Full thickness tissue loss. Subcutaneous fat may be visible but bone, tendon or muscle are NOT exposed. (Active)    Code status:   Code Status: Limited: Do not attempt resuscitation (DNR) -DNR-LIMITED -Do Not Intubate/DNI    DVT Prophylaxis: Place and maintain sequential compression device Start: 05/08/24 0211 Place TED hose Start: 05/07/24 2325   Family Communication: Son-Dr Lael Corbin-[phone] left voicemail on 12/6   Disposition Plan: Status is: Inpatient Remains inpatient appropriate because: Severity of illness   Planned Discharge Destination:Skilled nursing facility   Diet: Diet Order             DIET DYS 2 Room service appropriate? No; Fluid consistency: Nectar Thick  Diet effective now                      Antimicrobial agents: Anti-infectives (From admission, onward)    Start     Dose/Rate Route Frequency Ordered Stop   05/09/24 1800  vancomycin  (VANCOREADY) IVPB 750 mg/150 mL  Status:  Discontinued        750 mg 150 mL/hr over 60 Minutes Intravenous Every 48 hours 05/08/24 0332 05/08/24 1056   05/08/24 0600  piperacillin -tazobactam (ZOSYN ) IVPB 3.375 g        3.375 g 12.5 mL/hr over 240 Minutes Intravenous Every 8 hours 05/08/24 0326 05/14/24 1400   05/07/24 2000  ceFEPIme  (MAXIPIME ) 2 g in sodium chloride  0.9 % 100 mL IVPB  Status:  Discontinued        2 g 200 mL/hr over 30 Minutes Intravenous Every 24 hours 05/07/24 1948 05/07/24 1955   05/07/24 2000  vancomycin  (VANCOCIN ) IVPB 1000 mg/200 mL premix        1,000 mg 200 mL/hr over 60 Minutes Intravenous  Once 05/07/24 1950 05/07/24 2225   05/07/24 2000  piperacillin -tazobactam (ZOSYN ) IVPB 3.375 g        3.375 g 100 mL/hr over 30 Minutes Intravenous  Once 05/07/24 1956 05/07/24 2101   05/07/24 1945  ceFEPIme  (MAXIPIME ) 2 g in sodium chloride  0.9 % 100 mL IVPB  Status:  Discontinued        2 g 200 mL/hr over 30 Minutes Intravenous Every 12 hours 05/07/24 1944 05/07/24 1948        MEDICATIONS: Scheduled Meds:  budesonide   9 mg Oral Daily   Chlorhexidine  Gluconate Cloth  6 each  Topical Daily   collagenase    Topical Daily   cyanocobalamin   1,000 mcg Subcutaneous Q30 days   docusate sodium   100 mg Oral BID   donepezil   10 mg Oral QHS   feeding supplement  237 mL Oral BID BM   ferrous sulfate   325 mg Oral Q breakfast   furosemide   20 mg Intravenous Once   glycopyrrolate   0.1 mg Intravenous QID   metoprolol  tartrate  25 mg Oral BID   mirtazapine   7.5 mg Oral QHS    morphine  injection  1 mg Intravenous Once   multivitamin with minerals  1 tablet Oral Daily   pantoprazole   40 mg Oral Daily   sucralfate   1 g Oral TID AC & HS   thiamine   100 mg Oral Daily   Continuous Infusions:   PRN Meds:.albuterol ,  guaiFENesin -dextromethorphan , morphine  injection   I have personally reviewed following labs and imaging studies  LABORATORY DATA: CBC: Recent Labs  Lab 05/08/24 1600 05/09/24 0347 05/10/24 0320 05/13/24 0409 05/15/24 0819  WBC 10.0 8.2 8.3 8.9 5.6  HGB 8.6* 8.9* 8.9* 8.2* 7.9*  HCT 28.3* 29.7* 30.1* 27.7* 27.2*  MCV 81.6 81.6 81.4 80.5 82.2  PLT 271 297 339 343 294    Basic Metabolic Panel: Recent Labs  Lab 05/09/24 0347 05/10/24 0320 05/14/24 0443 05/15/24 0819  NA 142 143 145 143  K 3.2* 4.4 3.1* 3.7  CL 109 111 116* 113*  CO2 21* 24 21* 20*  GLUCOSE 73 92 87 98  BUN 25* 19 14 14   CREATININE 0.78 0.80 0.78 0.63  CALCIUM  8.5* 8.7* 8.1* 8.6*  MG 2.2 2.1  --   --   PHOS 2.6 2.7  --   --     GFR: Estimated Creatinine Clearance: 37.6 mL/min (by C-G formula based on SCr of 0.63 mg/dL).  Liver Function Tests: No results for input(s): AST, ALT, ALKPHOS, BILITOT, PROT, ALBUMIN in the last 168 hours.  No results for input(s): LIPASE, AMYLASE in the last 168 hours. No results for input(s): AMMONIA in the last 168 hours.  Coagulation Profile: No results for input(s): INR, PROTIME in the last 168 hours.  Cardiac Enzymes: No results for input(s): CKTOTAL, CKMB, CKMBINDEX, TROPONINI in the last 168 hours.  BNP (last 3 results) No results for input(s): PROBNP in the last 8760 hours.  Lipid Profile: No results for input(s): CHOL, HDL, LDLCALC, TRIG, CHOLHDL, LDLDIRECT in the last 72 hours.  Thyroid  Function Tests: No results for input(s): TSH, T4TOTAL, FREET4, T3FREE, THYROIDAB in the last 72 hours.  Anemia Panel: No results for input(s): VITAMINB12, FOLATE, FERRITIN, TIBC, IRON, RETICCTPCT in the last 72 hours.  Urine analysis:    Component Value Date/Time   COLORURINE YELLOW 05/08/2024 1112   APPEARANCEUR CLOUDY (A) 05/08/2024 1112   LABSPEC 1.020 05/08/2024 1112   PHURINE 6.0 05/08/2024  1112   GLUCOSEU NEGATIVE 05/08/2024 1112   HGBUR TRACE (A) 05/08/2024 1112   BILIRUBINUR NEGATIVE 05/08/2024 1112   BILIRUBINUR + 07/14/2015 1655   KETONESUR NEGATIVE 05/08/2024 1112   PROTEINUR 30 (A) 05/08/2024 1112   UROBILINOGEN negative 07/14/2015 1655   UROBILINOGEN 0.2 05/16/2013 1434   NITRITE NEGATIVE 05/08/2024 1112   LEUKOCYTESUR MODERATE (A) 05/08/2024 1112    Sepsis Labs: Lactic Acid, Venous    Component Value Date/Time   LATICACIDVEN 1.8 05/08/2024 0611    MICROBIOLOGY: Recent Results (from the past 240 hours)  Resp panel by RT-PCR (RSV, Flu A&B, Covid) Anterior Nasal Swab  Status: None   Collection Time: 05/07/24  7:06 PM   Specimen: Anterior Nasal Swab  Result Value Ref Range Status   SARS Coronavirus 2 by RT PCR NEGATIVE NEGATIVE Final   Influenza A by PCR NEGATIVE NEGATIVE Final   Influenza B by PCR NEGATIVE NEGATIVE Final    Comment: (NOTE) The Xpert Xpress SARS-CoV-2/FLU/RSV plus assay is intended as an aid in the diagnosis of influenza from Nasopharyngeal swab specimens and should not be used as a sole basis for treatment. Nasal washings and aspirates are unacceptable for Xpert Xpress SARS-CoV-2/FLU/RSV testing.  Fact Sheet for Patients: bloggercourse.com  Fact Sheet for Healthcare Providers: seriousbroker.it  This test is not yet approved or cleared by the [place]  FDA and has been authorized for detection and/or diagnosis of SARS-CoV-2 by FDA under an Emergency Use Authorization (EUA). This EUA will remain in effect (meaning this test can be used) for the duration of the COVID-19 declaration under Section 564(b)(1) of the Act, 21 U.S.C. section 360bbb-3(b)(1), unless the authorization is terminated or revoked.     Resp Syncytial Virus by PCR NEGATIVE NEGATIVE Final    Comment: (NOTE) Fact Sheet for Patients: bloggercourse.com  Fact Sheet for Healthcare  Providers: seriousbroker.it  This test is not yet approved or cleared by the [place]  FDA and has been authorized for detection and/or diagnosis of SARS-CoV-2 by FDA under an Emergency Use Authorization (EUA). This EUA will remain in effect (meaning this test can be used) for the duration of the COVID-19 declaration under Section 564(b)(1) of the Act, 21 U.S.C. section 360bbb-3(b)(1), unless the authorization is terminated or revoked.  Performed at [facility], [address] [place], [place] [address]   Blood culture (routine x 2)     Status: None   Collection Time: 05/07/24  7:20 PM   Specimen: BLOOD LEFT FOREARM  Result Value Ref Range Status   Specimen Description BLOOD LEFT FOREARM  Final   Special Requests   Final    BOTTLES DRAWN AEROBIC AND ANAEROBIC Blood Culture adequate volume   Culture   Final    NO GROWTH 5 DAYS Performed at [facility], [address] [place], [place] [address]    Report Status 05/12/2024 FINAL  Final  Blood culture (routine x 2)     Status: None   Collection Time: 05/08/24  6:11 AM   Specimen: BLOOD RIGHT ARM  Result Value Ref Range Status   Specimen Description BLOOD RIGHT ARM  Final   Special Requests   Final    BOTTLES DRAWN AEROBIC AND ANAEROBIC Blood Culture adequate volume   Culture   Final    NO GROWTH 5 DAYS Performed at [facility], [address] [place], [place] [address]    Report Status 05/13/2024 FINAL  Final  Urine Culture (for pregnant, neutropenic or urologic patients or patients with an indwelling urinary catheter)     Status: None   Collection Time: 05/08/24  1:23 PM   Specimen: Urine, Clean Catch  Result Value Ref Range Status   Specimen Description URINE, CLEAN CATCH  Final   Special Requests NONE  Final   Culture   Final    NO GROWTH Performed at [facility], [address] [place], [place] [address]    Report Status 05/09/2024 FINAL  Final     RADIOLOGY STUDIES/RESULTS: DG Chest Port 1V same Day Result Date: 05/15/2024 EXAM: 1 VIEW(S) XRAY OF THE CHEST 05/15/2024 08:39:00 AM COMPARISON: 05/14/2024  CLINICAL HISTORY: SOB (shortness of breath) FINDINGS: LUNGS AND PLEURA: Bilateral pleural effusions and mild pulmonary edema, unchanged. Superimposed bibasilar atelectasis and/or airspace opacities. No pneumothorax. HEART AND MEDIASTINUM: Stable cardiomegaly. Aortic atherosclerosis. BONES AND SOFT TISSUES: Chronic right rib deformities. Thoracolumbar scoliosis. IMPRESSION: 1. Bilateral pleural effusions and mild pulmonary edema, unchanged. 2. Superimposed bibasilar atelectasis and/or airspace opacities. 3. Stable cardiomegaly. Electronically signed by: Waddell Calk MD 05/15/2024 09:08 AM EST RP Workstation: [id number]   DG CHEST PORT 1 VIEW Result Date: 05/14/2024 CLINICAL DATA:  Dyspnea EXAM: PORTABLE CHEST 1 VIEW COMPARISON:  05/09/2024 FINDINGS: Single frontal view of the chest demonstrates stable enlargement of the cardiac silhouette. There is persistent central vascular congestion, with bibasilar veiling opacities consistent with lower lobe consolidation and bilateral pleural effusions. No pneumothorax. Stable right convex scoliosis and chronic posttraumatic changes of the right thoracic cage. No acute fractures. IMPRESSION: 1. Stable bilateral pleural effusions and bibasilar consolidation. 2. Stable enlarged cardiac silhouette and central pulmonary vascular congestion. Electronically Signed   By: Ozell Daring M.D.   On: 05/14/2024 16:56     LOS: 5 days   Donalda Applebaum, MD  [facility]    To contact the attending provider between 7A-7P or the covering provider during after hours 7P-7A, please log into the web site [email or web address] and access using universal [place] password for that web site. If you do not have the password, please call the hospital operator.  05/15/2024, 10:34 AM

## 2024-05-15 NOTE — Plan of Care (Signed)

## 2024-05-15 NOTE — Plan of Care (Signed)
  Problem: Pain Managment: Goal: General experience of comfort will improve and/or be controlled Outcome: Progressing   Problem: Education: Goal: Knowledge of General Education information will improve Description: Including pain rating scale, medication(s)/side effects and non-pharmacologic comfort measures Outcome: Adequate for Discharge   Problem: Activity: Goal: Risk for activity intolerance will decrease Outcome: Adequate for Discharge   Problem: Elimination: Goal: Will not experience complications related to urinary retention Outcome: Adequate for Discharge

## 2024-05-16 ENCOUNTER — Inpatient Hospital Stay (HOSPITAL_COMMUNITY): Payer: Medicare (Managed Care)

## 2024-05-16 DIAGNOSIS — A419 Sepsis, unspecified organism: Principal | ICD-10-CM

## 2024-05-16 DIAGNOSIS — Z515 Encounter for palliative care: Secondary | ICD-10-CM | POA: Diagnosis not present

## 2024-05-16 DIAGNOSIS — Z66 Do not resuscitate: Secondary | ICD-10-CM | POA: Diagnosis not present

## 2024-05-16 DIAGNOSIS — Z7189 Other specified counseling: Secondary | ICD-10-CM | POA: Diagnosis not present

## 2024-05-16 DIAGNOSIS — G9340 Encephalopathy, unspecified: Secondary | ICD-10-CM

## 2024-05-16 DIAGNOSIS — K922 Gastrointestinal hemorrhage, unspecified: Secondary | ICD-10-CM | POA: Diagnosis not present

## 2024-05-16 DIAGNOSIS — F0390 Unspecified dementia without behavioral disturbance: Secondary | ICD-10-CM

## 2024-05-16 DIAGNOSIS — N390 Urinary tract infection, site not specified: Secondary | ICD-10-CM

## 2024-05-16 LAB — BASIC METABOLIC PANEL WITH GFR
Anion gap: 8 (ref 5–15)
BUN: 11 mg/dL (ref 8–23)
CO2: 24 mmol/L (ref 22–32)
Calcium: 8.5 mg/dL — ABNORMAL LOW (ref 8.9–10.3)
Chloride: 110 mmol/L (ref 98–111)
Creatinine, Ser: 0.5 mg/dL (ref 0.44–1.00)
GFR, Estimated: >60 mL/min (ref >60)
Glucose, Bld: 92 mg/dL (ref 70–99)
Potassium: 3.7 mmol/L (ref 3.5–5.1)
Sodium: 142 mmol/L (ref 135–145)

## 2024-05-16 LAB — ECHOCARDIOGRAM LIMITED
Height: 65 in
S' Lateral: 3.6 cm
Weight: 1728.41 [oz_av]

## 2024-05-16 LAB — GLUCOSE, CAPILLARY: Glucose-Capillary: 92 mg/dL (ref 70–99)

## 2024-05-16 MED ORDER — FUROSEMIDE 10 MG/ML IJ SOLN
40.0000 mg | Freq: Once | INTRAMUSCULAR | Status: DC
  Filled 2024-05-16: qty 4

## 2024-05-16 MED ORDER — GLYCOPYRROLATE 1 MG PO TABS
1.0000 mg | ORAL_TABLET | Freq: Two times a day (BID) | ORAL | Status: DC
  Administered 2024-05-16 – 2024-05-17 (×3): 1 mg via ORAL
  Filled 2024-05-16 (×3): qty 1

## 2024-05-16 MED ORDER — FUROSEMIDE 40 MG PO TABS
40.0000 mg | ORAL_TABLET | Freq: Every day | ORAL | Status: AC
  Administered 2024-05-16: 40 mg via ORAL
  Filled 2024-05-16: qty 1

## 2024-05-16 NOTE — Progress Notes (Signed)
 PROGRESS NOTE        PATIENT DETAILS Name: [name] Age: 89 y.o. Sex: female Date of Birth: 1933-01-08 Admit Date: 05/07/2024 Admitting Physician Dorothyann CHRISTELLA Helling, MD ERE:[email or web address], Provider Not In  Brief Summary: Patient is a 89 y.o.  female with history of chron's disease, chronic HFpEF, HTN, dementia who presented with sepsis likely secondary to aspiration pneumonia.  Significant events: 11/28>> admit to [facility]-confusion/fever 12/04>> recurrent urinary retention-Foley catheter inserted  Significant studies: 11/28>> CXR: Multiple chronic right-sided fractures, moderate to severe left basilar scarring and or atelectasis. 11/28>> CTA chest: No PE 11/28>> CT abdomen/pelvis: Acute diverticulitis in the descending colon. 11/30>> CXR: Small right/moderate left pleural effusion, bibasilar opacities-slightly progressed from prior.  Significant microbiology data: 11/28>> COVID/influenza/RSV PCR: Negative 11/28>> blood culture: No growth 11/29>> blood culture: No growth 11/29>> urine culture: No growth  Procedures: None  Consults: Palliative care  Subjective: No major issues overnight-lying comfortably in bed-pleasantly confused.  Objective: Vitals: Blood pressure (!) 156/76, pulse 83, temperature 97.9 F (36.6 C), temperature source Oral, resp. rate (!) 21, height 5' 5 (1.651 m), weight 49 kg, SpO2 94%.   Exam: Pleasantly confused-significantly less tachypneic than yesterday Fine bibasilar rales  Pertinent Labs/Radiology:    Latest Ref Rng & Units 05/15/2024    8:19 AM 05/13/2024    4:09 AM 05/10/2024    3:20 AM  CBC  WBC 4.0 - 10.5 K/uL 5.6  8.9  8.3   Hemoglobin 12.0 - 15.0 g/dL 7.9  8.2  8.9   Hematocrit 36.0 - 46.0 % 27.2  27.7  30.1   Platelets 150 - 400 K/uL 294  343  339     Lab Results  Component Value Date   NA 142 05/16/2024   K 3.7 05/16/2024   CL 110 05/16/2024   CO2 24 05/16/2024      Assessment/Plan: Sepsis  secondary to aspiration pneumonia Sepsis physiology improved Zosyn  x 7 days planned-EOT 12/5 Continue strict aspiration precautions. Per prior notes-family okay to resume diet-accepting full risk of aspiration.  HFpEF exacerbation Less tachypneic No peripheral edema/excess volume Repeat IV Lasix  today x 1 dose. Await echo   Acute diverticulitis Probably an incidental finding-belly exam benign Already covered with IV Zosyn   Lower GI bleeding with acute blood loss anemia No further lower GI bleeding-brown stools per last GI note No plans for endoscopic evaluation per GI Hb stable-did receive 1 unit of PRBC on 11/28 Follow CBC.  Acute metabolic encephalopathy superimposed on dementia Likely secondary to sepsis physiology/aspiration pneumonia Pleasantly confused this morning Continue to treat underlying pneumonia Delirium precautions.  Chronic HFpEF Currently euvolemic  HTN BP stable Continue metoprolol .    Hypokalemia Repleted  Acute urinary retention Developed recurrent urinary retention-s/p numerous in/out catheterization-subsequently required Foley catheter insertion on 12/5 (600 cc returned)  Blood pressure too soft to tolerate Flomax  Keep Foley catheter in place-Will likely require outpatient voiding trial.    Cron's disease Seems stable Budesonide  orally  GERD PPI/Carafate   Borderline B12 deficiency Continue supplementation.  Hypernatremia Resolved  Asymmetric right sided bladder wall thickening (cystitis versus neoplasm) Already treated with empiric antibiotics-but cultures negative Needs outpatient follow-up with urology for cystoscopy if desired by patient/family.  Dementia Continue Aricept /Remeron  Maintain delirium precautions.  Thyroid  nodule 1.6 cm Incidental finding on CT imaging Not sure if any significant benefit for further workup given advanced age/dementia.  Will  defer to PCP.  Palliative care DNR Reviewed last few notes from prior  MD and palliative care team-no further escalation in care-allow time for outcomes.  Thankfully she seems to have improved quite a bit-however we will always remain at risk for aspiration episodes in the future.   Nutrition Status: Nutrition Problem: Severe Malnutrition Etiology: chronic illness Signs/Symptoms: severe muscle depletion, severe fat depletion Interventions: Refer to RD note for recommendations, Ensure Enlive (each supplement provides 350kcal and 20 grams of protein), Magic cup, MVI  Pressure Ulcer: Agree with assessment and plan as outlined below. Wound 05/08/24 1449 Pressure Injury Sacrum Lower Deep Tissue Pressure Injury - Purple or maroon localized area of discolored intact skin or blood-filled blister due to damage of underlying soft tissue from pressure and/or shear. (Active)     Wound 05/08/24 1450 Pressure Injury Vertebral column Lower Stage 3 -  Full thickness tissue loss. Subcutaneous fat may be visible but bone, tendon or muscle are NOT exposed. (Active)    Code status:   Code Status: Limited: Do not attempt resuscitation (DNR) -DNR-LIMITED -Do Not Intubate/DNI    DVT Prophylaxis: Place and maintain sequential compression device Start: 05/08/24 0211 Place TED hose Start: 05/07/24 2325   Family Communication: Son-Dr Lael Corbin-[phone] updated over the phone on 12/7-he is aware that if patient is stable overnight-likely home tomorrow morning.   Disposition Plan: Status is: Inpatient Remains inpatient appropriate because: Severity of illness   Planned Discharge Destination:Skilled nursing facility   Diet: Diet Order             DIET DYS 2 Room service appropriate? No; Fluid consistency: Nectar Thick  Diet effective now                     Antimicrobial agents: Anti-infectives (From admission, onward)    Start     Dose/Rate Route Frequency Ordered Stop   05/09/24 1800  vancomycin  (VANCOREADY) IVPB 750 mg/150 mL  Status:  Discontinued         750 mg 150 mL/hr over 60 Minutes Intravenous Every 48 hours 05/08/24 0332 05/08/24 1056   05/08/24 0600  piperacillin -tazobactam (ZOSYN ) IVPB 3.375 g        3.375 g 12.5 mL/hr over 240 Minutes Intravenous Every 8 hours 05/08/24 0326 05/14/24 1400   05/07/24 2000  ceFEPIme  (MAXIPIME ) 2 g in sodium chloride  0.9 % 100 mL IVPB  Status:  Discontinued        2 g 200 mL/hr over 30 Minutes Intravenous Every 24 hours 05/07/24 1948 05/07/24 1955   05/07/24 2000  vancomycin  (VANCOCIN ) IVPB 1000 mg/200 mL premix        1,000 mg 200 mL/hr over 60 Minutes Intravenous  Once 05/07/24 1950 05/07/24 2225   05/07/24 2000  piperacillin -tazobactam (ZOSYN ) IVPB 3.375 g        3.375 g 100 mL/hr over 30 Minutes Intravenous  Once 05/07/24 1956 05/07/24 2101   05/07/24 1945  ceFEPIme  (MAXIPIME ) 2 g in sodium chloride  0.9 % 100 mL IVPB  Status:  Discontinued        2 g 200 mL/hr over 30 Minutes Intravenous Every 12 hours 05/07/24 1944 05/07/24 1948        MEDICATIONS: Scheduled Meds:  budesonide   9 mg Oral Daily   Chlorhexidine  Gluconate Cloth  6 each Topical Daily   collagenase    Topical Daily   cyanocobalamin   1,000 mcg Subcutaneous Q30 days   docusate sodium   100 mg Oral BID   donepezil   10  mg Oral QHS   feeding supplement  237 mL Oral BID BM   ferrous sulfate   325 mg Oral Q breakfast   furosemide   40 mg Intravenous Once   glycopyrrolate   0.1 mg Intravenous QID   metoprolol  tartrate  25 mg Oral BID   mirtazapine   7.5 mg Oral QHS    morphine  injection  1 mg Intravenous Once   multivitamin with minerals  1 tablet Oral Daily   pantoprazole   40 mg Oral Daily   sucralfate   1 g Oral TID AC & HS   Continuous Infusions:   PRN Meds:.albuterol , guaiFENesin -dextromethorphan , morphine  injection   I have personally reviewed following labs and imaging studies  LABORATORY DATA: CBC: Recent Labs  Lab 05/10/24 0320 05/13/24 0409 05/15/24 0819  WBC 8.3 8.9 5.6  HGB 8.9* 8.2* 7.9*  HCT 30.1* 27.7*  27.2*  MCV 81.4 80.5 82.2  PLT 339 343 294    Basic Metabolic Panel: Recent Labs  Lab 05/10/24 0320 05/14/24 0443 05/15/24 0819 05/16/24 0741  NA 143 145 143 142  K 4.4 3.1* 3.7 3.7  CL 111 116* 113* 110  CO2 24 21* 20* 24  GLUCOSE 92 87 98 92  BUN 19 14 14 11   CREATININE 0.80 0.78 0.63 0.50  CALCIUM  8.7* 8.1* 8.6* 8.5*  MG 2.1  --   --   --   PHOS 2.7  --   --   --     GFR: Estimated Creatinine Clearance: 37.6 mL/min (by C-G formula based on SCr of 0.5 mg/dL).  Liver Function Tests: No results for input(s): AST, ALT, ALKPHOS, BILITOT, PROT, ALBUMIN in the last 168 hours.  No results for input(s): LIPASE, AMYLASE in the last 168 hours. No results for input(s): AMMONIA in the last 168 hours.  Coagulation Profile: No results for input(s): INR, PROTIME in the last 168 hours.  Cardiac Enzymes: No results for input(s): CKTOTAL, CKMB, CKMBINDEX, TROPONINI in the last 168 hours.  BNP (last 3 results) No results for input(s): PROBNP in the last 8760 hours.  Lipid Profile: No results for input(s): CHOL, HDL, LDLCALC, TRIG, CHOLHDL, LDLDIRECT in the last 72 hours.  Thyroid  Function Tests: No results for input(s): TSH, T4TOTAL, FREET4, T3FREE, THYROIDAB in the last 72 hours.  Anemia Panel: No results for input(s): VITAMINB12, FOLATE, FERRITIN, TIBC, IRON, RETICCTPCT in the last 72 hours.  Urine analysis:    Component Value Date/Time   COLORURINE YELLOW 05/08/2024 1112   APPEARANCEUR CLOUDY (A) 05/08/2024 1112   LABSPEC 1.020 05/08/2024 1112   PHURINE 6.0 05/08/2024 1112   GLUCOSEU NEGATIVE 05/08/2024 1112   HGBUR TRACE (A) 05/08/2024 1112   BILIRUBINUR NEGATIVE 05/08/2024 1112   BILIRUBINUR + 07/14/2015 1655   KETONESUR NEGATIVE 05/08/2024 1112   PROTEINUR 30 (A) 05/08/2024 1112   UROBILINOGEN negative 07/14/2015 1655   UROBILINOGEN 0.2 05/16/2013 1434   NITRITE NEGATIVE 05/08/2024 1112    LEUKOCYTESUR MODERATE (A) 05/08/2024 1112    Sepsis Labs: Lactic Acid, Venous    Component Value Date/Time   LATICACIDVEN 1.8 05/08/2024 9388    MICROBIOLOGY: Recent Results (from the past 240 hours)  Resp panel by RT-PCR (RSV, Flu A&B, Covid) Anterior Nasal Swab     Status: None   Collection Time: 05/07/24  7:06 PM   Specimen: Anterior Nasal Swab  Result Value Ref Range Status   SARS Coronavirus 2 by RT PCR NEGATIVE NEGATIVE Final   Influenza A by PCR NEGATIVE NEGATIVE Final   Influenza B by PCR NEGATIVE NEGATIVE  Final    Comment: (NOTE) The Xpert Xpress SARS-CoV-2/FLU/RSV plus assay is intended as an aid in the diagnosis of influenza from Nasopharyngeal swab specimens and should not be used as a sole basis for treatment. Nasal washings and aspirates are unacceptable for Xpert Xpress SARS-CoV-2/FLU/RSV testing.  Fact Sheet for Patients: bloggercourse.com  Fact Sheet for Healthcare Providers: seriousbroker.it  This test is not yet approved or cleared by the [place]  FDA and has been authorized for detection and/or diagnosis of SARS-CoV-2 by FDA under an Emergency Use Authorization (EUA). This EUA will remain in effect (meaning this test can be used) for the duration of the COVID-19 declaration under Section 564(b)(1) of the Act, 21 U.S.C. section 360bbb-3(b)(1), unless the authorization is terminated or revoked.     Resp Syncytial Virus by PCR NEGATIVE NEGATIVE Final    Comment: (NOTE) Fact Sheet for Patients: bloggercourse.com  Fact Sheet for Healthcare Providers: seriousbroker.it  This test is not yet approved or cleared by the [place]  FDA and has been authorized for detection and/or diagnosis of SARS-CoV-2 by FDA under an Emergency Use Authorization (EUA). This EUA will remain in effect (meaning this test can be used) for the duration of the COVID-19  declaration under Section 564(b)(1) of the Act, 21 U.S.C. section 360bbb-3(b)(1), unless the authorization is terminated or revoked.  Performed at [facility], [address], [place], [place] [address]   Blood culture (routine x 2)     Status: None   Collection Time: 05/07/24  7:20 PM   Specimen: BLOOD LEFT FOREARM  Result Value Ref Range Status   Specimen Description BLOOD LEFT FOREARM  Final   Special Requests   Final    BOTTLES DRAWN AEROBIC AND ANAEROBIC Blood Culture adequate volume   Culture   Final    NO GROWTH 5 DAYS Performed at [facility], [address], [place], [place] [address]    Report Status 05/12/2024 FINAL  Final  Blood culture (routine x 2)     Status: None   Collection Time: 05/08/24  6:11 AM   Specimen: BLOOD RIGHT ARM  Result Value Ref Range Status   Specimen Description BLOOD RIGHT ARM  Final   Special Requests   Final    BOTTLES DRAWN AEROBIC AND ANAEROBIC Blood Culture adequate volume   Culture   Final    NO GROWTH 5 DAYS Performed at [facility], [address], [place], [place] [address]    Report Status 05/13/2024 FINAL  Final  Urine Culture (for pregnant, neutropenic or urologic patients or patients with an indwelling urinary catheter)     Status: None   Collection Time: 05/08/24  1:23 PM   Specimen: Urine, Clean Catch  Result Value Ref Range Status   Specimen Description URINE, CLEAN CATCH  Final   Special Requests NONE  Final   Culture   Final    NO GROWTH Performed at [facility], [address] [place], [place] [address]    Report Status 05/09/2024 FINAL  Final    RADIOLOGY STUDIES/RESULTS: DG Chest Port 1V same Day Result Date: 05/15/2024 EXAM: 1 VIEW(S) XRAY OF THE CHEST 05/15/2024 08:39:00 AM COMPARISON: 05/14/2024 CLINICAL HISTORY: SOB (shortness of breath) FINDINGS: LUNGS AND PLEURA: Bilateral pleural effusions and mild pulmonary edema, unchanged. Superimposed bibasilar atelectasis and/or airspace  opacities. No pneumothorax. HEART AND MEDIASTINUM: Stable cardiomegaly. Aortic atherosclerosis. BONES AND SOFT TISSUES: Chronic right rib deformities. Thoracolumbar scoliosis. IMPRESSION: 1. Bilateral pleural effusions and mild  pulmonary edema, unchanged. 2. Superimposed bibasilar atelectasis and/or airspace opacities. 3. Stable cardiomegaly. Electronically signed by: Waddell Calk MD 05/15/2024 09:08 AM EST RP Workstation: [id number]   DG CHEST PORT 1 VIEW Result Date: 05/14/2024 CLINICAL DATA:  Dyspnea EXAM: PORTABLE CHEST 1 VIEW COMPARISON:  05/09/2024 FINDINGS: Single frontal view of the chest demonstrates stable enlargement of the cardiac silhouette. There is persistent central vascular congestion, with bibasilar veiling opacities consistent with lower lobe consolidation and bilateral pleural effusions. No pneumothorax. Stable right convex scoliosis and chronic posttraumatic changes of the right thoracic cage. No acute fractures. IMPRESSION: 1. Stable bilateral pleural effusions and bibasilar consolidation. 2. Stable enlarged cardiac silhouette and central pulmonary vascular congestion. Electronically Signed   By: Ozell Daring M.D.   On: 05/14/2024 16:56     LOS: 6 days   Donalda Applebaum, MD  [facility]    To contact the attending provider between 7A-7P or the covering provider during after hours 7P-7A, please log into the web site [email or web address] and access using universal [place] password for that web site. If you do not have the password, please call the hospital operator.  05/16/2024, 1:13 PM

## 2024-05-16 NOTE — Progress Notes (Addendum)
   Palliative Medicine Inpatient Follow Up Note HPI: 89 y.o. female  with past medical history of CHF, Crohn's disease, diverticulosis, uterovaginal prolapse with pessary, hemorrhoids, hyperparathyroidism s/p prior parathyroidectomy, HTN, HLD, osteoporosis, vitamin D  deficiency, CKD 3, protein calorie malnutrition.  She was admitted on 05/07/2024 with sepsis likely multifactorial in setting of acute diverticulitis and UTI; aspiration pneumonia, respiratory distress from aspiration pneumonia, BRBPR with Hemoccult positive stool, acute metabolic encephalopathy on underlying dementia, severe protein calorie malnutrition, and others.   Palliative medicine was consulted for GOC conversations.  Today's Discussion 05/16/2024  *Please note that this is a verbal dictation therefore any spelling or grammatical errors are due to the Dragon Medical One system interpretation.  I reviewed the chart notes including nursing notes from [employer], progress notes from [employer]. I also reviewed vital signs, nursing flowsheets, medication administrations record, labs BMP: Na 142, K 3.7, Chl 110, CO2 24, Glu 92, BUN 11, Cr 0.50, Anion Gap 8, BUN > 60, and imaging CXR 12/6 w/ bilateral pleural effusions.    Oral Intake %:  30% I/O:  (+) 160 Bowel Movements:  Last 12/6 Mobility: Bed-bound at baseline  I met with [name] at bedside this afternoon. She is awake though very disoriented. She is noted to have dislodged her IV. She does not endorse any pain, shortness of breath, or nausea. [name] does share that she is quite tired and would like to take a nap. I was able to help clean her arm and tuck her in.  Nursing staff informed of patient removing her IV.   There is not family present at bedside this afternoon though I have reached out to the primary team who share the plan for discharge soon.   Questions and concerns addressed/Palliative Support Provided.   Objective Assessment: Vital Signs Vitals:    05/15/24 2027 05/16/24 0700  BP: (!) 188/94 (!) 156/76  Pulse: 83   Resp:    Temp: 98.5 F (36.9 C) 97.9 F (36.6 C)  SpO2: 94%     Intake/Output Summary (Last 24 hours) at 05/16/2024 1320 Last data filed at 05/16/2024 0941 Gross per 24 hour  Intake 278 ml  Output --  Net 278 ml   Last Weight  Most recent update: 05/08/2024 12:37 AM    Weight  49 kg (108 lb 0.4 oz)            Gen: Elderly chronically ill appearing Caucasian F HEENT: Dry mucous membranes CV: Regular rate and rhythm  PULM:  On RA, breathing is even and nonlabored ABD: soft/nontender  EXT: Legs contracted behind patient Neuro: Disoriented  SUMMARY OF RECOMMENDATIONS   DNAR-Limited Continue current scope of care Recommended outpatient palliative care at discharge Delirium precautions implemented Please notify us  of any significant clinical change or new palliative needs in the interim ______________________________________________________________________________________ [name] [name] Palliative Medicine Team Team Cell Phone: [phone] Please utilize secure chat with additional questions, if there is no response within 30 minutes please call the above phone number  Time Spent: 25  Palliative Medicine Team providers are available by phone from 7am to 7pm daily and can be reached through the team cell phone.  Should this patient require assistance outside of these hours, please call the patient's attending physician.

## 2024-05-16 NOTE — Plan of Care (Signed)

## 2024-05-16 NOTE — Plan of Care (Signed)
  Problem: Clinical Measurements: Goal: Diagnostic test results will improve Outcome: Progressing Goal: Respiratory complications will improve Outcome: Progressing   Problem: Nutrition: Goal: Adequate nutrition will be maintained Outcome: Progressing   

## 2024-05-17 MED ORDER — CYANOCOBALAMIN 1000 MCG/ML IJ SOLN: 1000.0000 ug | INTRAMUSCULAR | Status: AC

## 2024-05-17 MED ORDER — ALBUTEROL SULFATE (2.5 MG/3ML) 0.083% IN NEBU: 2.5000 mg | INHALATION_SOLUTION | RESPIRATORY_TRACT | Status: AC | PRN

## 2024-05-17 MED ORDER — TAMSULOSIN HCL 0.4 MG PO CAPS
0.4000 mg | ORAL_CAPSULE | Freq: Every day | ORAL | Status: DC
  Administered 2024-05-17: 0.4 mg via ORAL
  Filled 2024-05-17: qty 1

## 2024-05-17 MED ORDER — FUROSEMIDE 40 MG PO TABS: 20.0000 mg | ORAL_TABLET | Freq: Every day | ORAL | Status: AC | PRN

## 2024-05-17 MED ORDER — ACETAMINOPHEN 325 MG PO TABS
650.0000 mg | ORAL_TABLET | Freq: Once | ORAL | Status: AC
  Administered 2024-05-17: 650 mg via ORAL
  Filled 2024-05-17: qty 2

## 2024-05-17 MED ORDER — COLLAGENASE 250 UNIT/GM EX OINT: TOPICAL_OINTMENT | Freq: Every day | CUTANEOUS | 0 refills | Status: AC

## 2024-05-17 MED ORDER — GLYCOPYRROLATE 1 MG PO TABS: 1.0000 mg | ORAL_TABLET | Freq: Two times a day (BID) | ORAL | Status: AC

## 2024-05-17 MED ORDER — METOPROLOL TARTRATE 25 MG PO TABS: 25.0000 mg | ORAL_TABLET | Freq: Two times a day (BID) | ORAL | Status: AC

## 2024-05-17 MED ORDER — TAMSULOSIN HCL 0.4 MG PO CAPS: 0.4000 mg | ORAL_CAPSULE | Freq: Every day | ORAL | Status: AC

## 2024-05-17 NOTE — Discharge Planning (Signed)
 Report called to RN Sonny at [facility]

## 2024-05-17 NOTE — TOC Transition Note (Signed)
 Transition of Care [facility]) - Discharge Note   Patient Details  Name: [name] MRN: [id number] Date of Birth: [birth year]  [facility] CM/SW Contact:  Luann SHAUNNA Cumming, LCSW Phone Number: 05/17/2024, 10:18 AM   Clinical Narrative:     Per MD patient ready for DC to [facility]. RN, patient, patient's family, and facility notified of DC. Discharge Summary and FL2 sent to facility. RN to call report prior to discharge [phone]). DC packet on chart. Ambulance transport requested for patient.   CSW will sign off for now as social work intervention is no longer needed. Please consult us  again if new needs arise.   Final next level of care: Skilled Nursing Facility Barriers to Discharge: No Barriers Identified   Patient Goals and CMS Choice Patient states their goals for this hospitalization and ongoing recovery are:: Return to SNF          Discharge Placement   Existing PASRR number confirmed : 05/10/24          Patient chooses bed at:  [facility]) Patient to be transferred to facility by: [facility] Name of family member notified: son Patient and family notified of of transfer: 05/17/24  Discharge Plan and Services Additional resources added to the After Visit Summary for                                       Social Drivers of Health (SDOH) Interventions SDOH Screenings   Food Insecurity: Patient Unable To Answer (05/08/2024)  Housing: Patient Unable To Answer (05/08/2024)  Transportation Needs: Patient Unable To Answer (05/08/2024)  Utilities: Patient Unable To Answer (05/08/2024)  Social Connections: Unknown (05/08/2024)  Tobacco Use: Low Risk  (05/03/2024)     Readmission Risk Interventions     No data to display

## 2024-05-17 NOTE — Discharge Summary (Signed)
 PATIENT DETAILS Name: [name] Age: 89 y.o. Sex: female Date of Birth: [birth year] MRN: [id number]. Admitting Physician: Dorothyann CHRISTELLA Helling, MD ERE:[id number], Provider Not In  Admit Date: 05/07/2024 Discharge date: 05/17/2024  Recommendations for Outpatient Follow-up:  Follow up with PCP in 1-2 weeks Palliative care follow-up at SNF Voiding trial-remove Foley in the next 1-2 weeks at [facility].  Admitted From:  SNF  Disposition: Skilled nursing facility   Discharge Condition: fair  CODE STATUS:   Code Status: Limited: Do not attempt resuscitation (DNR) -DNR-LIMITED -Do Not Intubate/DNI    Diet recommendation:  Diet Order             Diet general           DIET DYS 2 Room service appropriate? No; Fluid consistency: Nectar Thick  Diet effective now                    Brief Summary: Patient is a 89 y.o.  female with history of chron's disease, chronic HFpEF, HTN, dementia who presented with sepsis likely secondary to aspiration pneumonia.   Significant events: 11/28>> admit to [facility]-confusion/fever 12/04>> recurrent urinary retention-Foley catheter inserted   Significant studies: 11/28>> CXR: Multiple chronic right-sided fractures, moderate to severe left basilar scarring and or atelectasis. 11/28>> CTA chest: No PE 11/28>> CT abdomen/pelvis: Acute diverticulitis in the descending colon. 11/30>> CXR: Small right/moderate left pleural effusion, bibasilar opacities-slightly progressed from prior. 12/07>> Limited echo: EF 60-65%, some degree of aortic stenosis but likely not severe.   Significant microbiology data: 11/28>> COVID/influenza/RSV PCR: Negative 11/28>> blood culture: No growth 11/29>> blood culture: No growth 11/29>> urine culture: No growth   Procedures: None   Consults: Palliative care GI  Brief Hospital Course: Sepsis secondary to aspiration pneumonia Sepsis physiology improved Zosyn  x 7 days completed-no further antibiotics on  discharge Per prior notes-family okay to resume diet-accepting full risk of aspiration. See below-but patient will always remain at risk for future episodes of aspiration pneumonia-maintain strict aspiration precautions at SNF.   HFpEF exacerbation Volume status stable-tachypnea is resolved Treated with several doses of furosemide  On discharge will place on as needed furosemide .   Acute diverticulitis Probably an incidental finding-belly exam benign Already covered with Zosyn .   Lower GI bleeding with acute blood loss anemia No further lower GI bleeding-brown stools per last GI note No plans for endoscopic evaluation per GI Hb stable-did receive 1 unit of PRBC on 11/28 Follow CBC.   Acute metabolic encephalopathy superimposed on dementia Likely secondary to sepsis physiology/aspiration pneumonia Pleasantly confused this morning Continue to treat underlying pneumonia Delirium precautions.   Chronic HFpEF Currently euvolemic   HTN BP stable Continue metoprolol .     Hypokalemia Repleted   Acute urinary retention Developed recurrent urinary retention-s/p numerous in/out catheterization-subsequently required Foley catheter insertion on 12/5 (600 cc returned)  Blood pressure was initially too soft to tolerate Flomax  but has improved-Will start Flomax  on discharge. Keep Foley catheter in place-Will likely require outpatient voiding trial.     Cron's disease Seems stable Budesonide  orally   GERD PPI/Carafate    Borderline B12 deficiency Continue supplementation.   Hypernatremia Resolved   Asymmetric right sided bladder wall thickening (cystitis versus neoplasm) Already treated with empiric antibiotics-but cultures negative Needs outpatient follow-up with urology for cystoscopy if desired by patient/family.   Dementia Continue Aricept /Remeron  Maintain delirium precautions.   Thyroid  nodule 1.6 cm Incidental finding on CT imaging Not sure if any significant benefit  for further workup given advanced age/dementia.  Will defer to PCP.   Palliative care DNR Reviewed last few notes from prior MD and palliative care team-no further escalation in care-allow time for outcomes.  Thankfully she seems to have improved quite a bit-however we will always remain at risk for aspiration episodes in the future.  Her overall prognosis is poor.  Please ensure follow-up with palliative care at SNF.    Nutrition Status: Nutrition Problem: Severe Malnutrition Etiology: chronic illness Signs/Symptoms: severe muscle depletion, severe fat depletion Interventions: Refer to RD note for recommendations, Ensure Enlive (each supplement provides 350kcal and 20 grams of protein), Magic cup, MVI   Pressure Ulcer: Agree with assessment and plan as outlined below.  Wound 05/08/24 1449 Pressure Injury Sacrum Lower Deep Tissue Pressure Injury - Purple or maroon localized area of discolored intact skin or blood-filled blister due to damage of underlying soft tissue from pressure and/or shear. (Active)     Wound 05/08/24 1450 Pressure Injury Vertebral column Lower Stage 3 -  Full thickness tissue loss. Subcutaneous fat may be visible but bone, tendon or muscle are NOT exposed. (Active)    Discharge Diagnoses:  Principal Problem:   GIB (gastrointestinal bleeding) Active Problems:   Iron deficiency anemia due to chronic blood loss   Heme positive stool   Diverticulitis   Abnormal CT of the abdomen   History of Crohn's disease   Protein-calorie malnutrition, severe   Discharge Instructions:  Activity:  As tolerated with Full fall precautions use walker/cane & assistance as needed   Discharge Instructions     Call MD for:  redness, tenderness, or signs of infection (pain, swelling, redness, odor or green/yellow discharge around incision site)   Complete by: As directed    Diet general   Complete by: As directed    Discharge instructions   Complete by: As directed    Follow  with Primary MD  in 1-2 weeks  Please get a complete blood count and chemistry panel checked by your Primary MD at your next visit, and again as instructed by your Primary MD.  Get Medicines reviewed and adjusted: Please take all your medications with you for your next visit with your Primary MD  Laboratory/radiological data: Please request your Primary MD to go over all hospital tests and procedure/radiological results at the follow up, please ask your Primary MD to get all Hospital records sent to his/her office.  In some cases, they will be blood work, cultures and biopsy results pending at the time of your discharge. Please request that your primary care M.D. follows up on these results.  Also Note the following: If you experience worsening of your admission symptoms, develop shortness of breath, life threatening emergency, suicidal or homicidal thoughts you must seek medical attention immediately by calling 911 or calling your MD immediately  if symptoms less severe.  You must read complete instructions/literature along with all the possible adverse reactions/side effects for all the Medicines you take and that have been prescribed to you. Take any new Medicines after you have completely understood and accpet all the possible adverse reactions/side effects.   Do not drive when taking Pain medications or sleeping medications (Benzodaizepines)  Do not take more than prescribed Pain, Sleep and Anxiety Medications. It is not advisable to combine anxiety,sleep and pain medications without talking with your primary care practitioner  Special Instructions: If you have smoked or chewed Tobacco  in the last 2 yrs please stop smoking, stop any regular Alcohol  and or any Recreational drug use.  Wear Seat belts while driving.  Please note: You were cared for by a hospitalist during your hospital stay. Once you are discharged, your primary care physician will handle any further medical issues.  Please note that NO REFILLS for any discharge medications will be authorized once you are discharged, as it is imperative that you return to your primary care physician (or establish a relationship with a primary care physician if you do not have one) for your post hospital discharge needs so that they can reassess your need for medications and monitor your lab values.   Discharge wound care:   Complete by: As directed    Wound care  Daily      Comments:  1.  Cleanse sacral wound with Vashe, apply 1/4 thick layer of Santyl  to wound bed, top with saline moist gauze and dry gauze. Secure with silicone foam or ABD pad and clothe tape whichever is preferred.  2. Cleanse lower mid back/spine wound with Vashe, using a Q tip applicator insert a small piece of Vashe moistened gauze into wound bed daily leaving a significant portion hanging out of wound opening for easy removal.  Cover with dry gauze and silicone foam. 3.Cleanse all skin tears R arm with NS, apply Vaseline gauze (Lawson [phone]) to wound beds daily and secure with silicone foam or ABD pad and Kerlix roll gauze whichever is preferred. Soak dressing with normal saline if adhered to wound bed for atraumatic removal.   Increase activity slowly   Complete by: As directed       Allergies as of 05/17/2024   No Active Allergies      Medication List     STOP taking these medications    sodium chloride  1 g tablet       TAKE these medications    albuterol  (2.5 MG/3ML) 0.083% nebulizer solution Commonly known as: PROVENTIL  Take 3 mLs (2.5 mg total) by nebulization every 4 (four) hours as needed for wheezing or shortness of breath.   budesonide  3 MG 24 hr capsule Commonly known as: ENTOCORT EC  Take 3 capsules (9 mg total) by mouth daily.   Calcium  High Potency/Vitamin D  600-5 MG-MCG Tabs Generic drug: Calcium  Carb-Cholecalciferol  Take 1 tablet by mouth in the morning and at bedtime.   collagenase  250 UNIT/GM ointment Commonly known  as: SANTYL  Apply topically daily. Cleanse sacral wound with Vashe, apply 1/4 thick layer of Santyl  to wound bed, top with saline moist gauze and dry gauze (see wound care order)   cyanocobalamin  1000 MCG/ML injection Commonly known as: VITAMIN B12 Inject 1 mL (1,000 mcg total) into the skin every 30 (thirty) days. Start taking on: June 14, 2024   docusate sodium  100 MG capsule Commonly known as: COLACE Take 1 capsule (100 mg total) by mouth 2 (two) times daily.   donepezil  10 MG tablet Commonly known as: ARICEPT  Take 1 tablet daily What changed:  how much to take how to take this when to take this additional instructions   ergocalciferol  1.25 MG (50000 UT) capsule Commonly known as: VITAMIN D2 Take 50,000 Units by mouth every Friday.   estradiol  7.5 MCG/24HR vaginal ring Commonly known as: ESTRING  Place 2 mg vaginally.   ferrous sulfate  324 MG Tbec Take 324 mg by mouth daily with breakfast.   fluticasone 50 MCG/ACT nasal spray Commonly known as: FLONASE Place 2 sprays into both nostrils daily.   furosemide  40 MG tablet Commonly known as: Lasix  Take 0.5 tablets (20 mg total) by mouth daily as needed  for fluid or edema.   glycopyrrolate  1 MG tablet Commonly known as: ROBINUL  Take 1 tablet (1 mg total) by mouth 2 (two) times daily.   hydrocortisone 2.5 % rectal cream Commonly known as: ANUSOL-HC Place 1 Application rectally every 8 (eight) hours as needed for hemorrhoids or anal itching.   metoprolol  tartrate 25 MG tablet Commonly known as: LOPRESSOR  Take 1 tablet (25 mg total) by mouth 2 (two) times daily.   mirtazapine  7.5 MG tablet Commonly known as: REMERON  Take 7.5 mg by mouth at bedtime.   multivitamin tablet Take 1 tablet by mouth daily.   NUTRITIONAL SUPPLEMENT PO Take 1 each by mouth 2 (two) times daily with a meal. Give one frozen nutritional treat with breakfast and dinner.   Nutritional Shake High Protein Liqd Take 1 Bottle by mouth 3 (three)  times daily.   ondansetron  4 MG tablet Commonly known as: ZOFRAN  Take 4 mg by mouth every 4 (four) hours as needed for vomiting or nausea.   Protonix  40 MG Generic drug: pantoprazole  sodium Take 40 mg by mouth in the morning. Give one packet by mouth in the morning for GI protection.   sucralfate  1 g tablet Commonly known as: CARAFATE  Take 1 g by mouth 4 (four) times daily. Take one tablet (1g) by mouth every 6 hours for GI protection for 6 weeks.   tamsulosin  0.4 MG Caps capsule Commonly known as: FLOMAX  Take 1 capsule (0.4 mg total) by mouth daily.               Discharge Care Instructions  (From admission, onward)           Start     Ordered   05/17/24 0000  Discharge wound care:       Comments: Wound care  Daily      Comments:  1.  Cleanse sacral wound with Vashe, apply 1/4 thick layer of Santyl  to wound bed, top with saline moist gauze and dry gauze. Secure with silicone foam or ABD pad and clothe tape whichever is preferred.  2. Cleanse lower mid back/spine wound with Vashe, using a Q tip applicator insert a small piece of Vashe moistened gauze into wound bed daily leaving a significant portion hanging out of wound opening for easy removal.  Cover with dry gauze and silicone foam. 3.Cleanse all skin tears R arm with NS, apply Vaseline gauze (Lawson [phone]) to wound beds daily and secure with silicone foam or ABD pad and Kerlix roll gauze whichever is preferred. Soak dressing with normal saline if adhered to wound bed for atraumatic removal.   05/17/24 0816            Follow-up Information     Primary care practitioner. Schedule an appointment as soon as possible for a visit in 1 week(s).                 No Active Allergies   Other Procedures/Studies: ECHOCARDIOGRAM LIMITED Result Date: 05/16/2024    ECHOCARDIOGRAM LIMITED REPORT   Patient Name:   AMBREE FRANCES Date of Exam: 05/16/2024 Medical Rec #:  [id number]       Height:       65.0 in Accession  #:    [id number]      Weight:       108.0 lb Date of Birth:  [birth year]      BSA:          1.523 m Patient Age:    41 years  BP:           156/76 mmHg Patient Gender: F               HR:           78 bpm. Exam Location:  Inpatient Procedure: Limited Echo Indications:    CHF-Acute Diastolic I50.31  History:        Patient has prior history of Echocardiogram examinations, most                 recent 08/24/2018. CHF; Risk Factors:Hypertension.  Sonographer:    Jayson Gaskins Referring Phys: [id number] [facility]  Sonographer Comments: Image acquisition challenging due to breast implants and patient rolled on right side. No Definity used due to no IV access. IMPRESSIONS  1. Limited echo no color flow or doppler May have some degree of AS although not likely severe.  2. Left ventricular ejection fraction, by estimation, is 60 to 65%. The left ventricle has normal function. There is mild left ventricular hypertrophy.  3. Right ventricular systolic function is normal. The right ventricular size is normal.  4. The aortic valve was not well visualized. There is moderate calcification of the aortic valve. There is moderate thickening of the aortic valve. FINDINGS  Left Ventricle: Left ventricular ejection fraction, by estimation, is 60 to 65%. The left ventricle has normal function. The left ventricular internal cavity size was small. There is mild left ventricular hypertrophy. Right Ventricle: The right ventricular size is normal. Right vetricular wall thickness was not assessed. Right ventricular systolic function is normal. Pericardium: There is no evidence of pericardial effusion. Aortic Valve: The aortic valve was not well visualized. There is moderate calcification of the aortic valve. There is moderate thickening of the aortic valve. Additional Comments: Limited echo no color flow or doppler May have some degree of AS although not likely severe.  LEFT VENTRICLE PLAX 2D LVIDd:         4.25 cm LVIDs:         3.60 cm  LV PW:         1.00 cm LV IVS:        1.10 cm LVOT diam:     2.00 cm LVOT Area:     3.14 cm   SHUNTS Systemic Diam: 2.00 cm Maude Emmer MD Electronically signed by Maude Emmer MD Signature Date/Time: 05/16/2024/3:07:44 PM    Final    DG Chest Port 1V same Day Result Date: 05/15/2024 EXAM: 1 VIEW(S) XRAY OF THE CHEST 05/15/2024 08:39:00 AM COMPARISON: 05/14/2024 CLINICAL HISTORY: SOB (shortness of breath) FINDINGS: LUNGS AND PLEURA: Bilateral pleural effusions and mild pulmonary edema, unchanged. Superimposed bibasilar atelectasis and/or airspace opacities. No pneumothorax. HEART AND MEDIASTINUM: Stable cardiomegaly. Aortic atherosclerosis. BONES AND SOFT TISSUES: Chronic right rib deformities. Thoracolumbar scoliosis. IMPRESSION: 1. Bilateral pleural effusions and mild pulmonary edema, unchanged. 2. Superimposed bibasilar atelectasis and/or airspace opacities. 3. Stable cardiomegaly. Electronically signed by: Waddell Calk MD 05/15/2024 09:08 AM EST RP Workstation: [id number]   DG CHEST PORT 1 VIEW Result Date: 05/14/2024 CLINICAL DATA:  Dyspnea EXAM: PORTABLE CHEST 1 VIEW COMPARISON:  05/09/2024 FINDINGS: Single frontal view of the chest demonstrates stable enlargement of the cardiac silhouette. There is persistent central vascular congestion, with bibasilar veiling opacities consistent with lower lobe consolidation and bilateral pleural effusions. No pneumothorax. Stable right convex scoliosis and chronic posttraumatic changes of the right thoracic cage. No acute fractures. IMPRESSION: 1. Stable bilateral pleural effusions and bibasilar consolidation. 2. Stable enlarged cardiac silhouette  and central pulmonary vascular congestion. Electronically Signed   By: Ozell Daring M.D.   On: 05/14/2024 16:56   DG Chest Port 1 View Result Date: 05/09/2024 EXAM: 1 VIEW(S) XRAY OF THE CHEST 05/09/2024 06:52:00 PM COMPARISON: 05/07/2024 CLINICAL HISTORY: Cough FINDINGS: LUNGS AND PLEURA: Chronic volume loss in  right hemithorax. Small right and moderate left pleural effusions, increased. Bibasilar opacities, slightly progressed from prior. No pneumothorax. HEART AND MEDIASTINUM: Aortic atherosclerosis. No acute abnormality of the cardiac and mediastinal silhouettes. BONES AND SOFT TISSUES: Chronic right rib fractures. IMPRESSION: 1. Small right and moderate left pleural effusions, increased. 2. Bibasilar opacities, slightly progressed from prior, which may reflect atelectasis and/or pneumonia. Electronically signed by: Norman Gatlin MD 05/09/2024 07:10 PM EST RP Workstation: [id number]   CT Angio Chest PE W and/or Wo Contrast Result Date: 05/07/2024 EXAM: CTA CHEST PE WITH CONTRAST CT ABDOMEN AND PELVIS WITH CONTRAST 05/07/2024 08:44:00 PM TECHNIQUE: CTA of the chest was performed after the administration of 75 mL of iohexol  (OMNIPAQUE ) 350 MG/ML intravenous contrast. Multiplanar reformatted images are provided for review. MIP images are provided for review. CT of the abdomen and pelvis was performed with the administration of intravenous contrast. Automated exposure control, iterative reconstruction, and/or weight based adjustment of the mA/kV was utilized to reduce the radiation dose to as low as reasonably achievable. COMPARISON: AP portable pelvis 12/04/2023, chest AP portable 12/04/2023, chest, abdomen, and pelvis CT with IV contrast 01/18/2020. CLINICAL HISTORY: Workup for PE. SOB. New O2 requirement. History of prior Crohn disease and history of ileitis, drop in hemoglobin, assess for abdominal hemorrhage or inflammation. FINDINGS: CHEST: PULMONARY ARTERIES: Pulmonary arteries are upper normal for caliber. The subsegmental arterial bed is largely obscured due to respiratory motion, but no arterial embolus is seen at least to the segmental level. There is no substantial pericardial effusion. The pulmonary veins are nondilated. MEDIASTINUM: There is a heterogeneous nodule of the inferior pole of the left lobe of  the thyroid  gland measuring 1.6 cm, previously 1 cm. Nonemergent follow-up thyroid  ultrasound is recommended if clinically warranted taking into account advanced age and life expectancy. There is mild cardiomegaly, interval moderate increased thickening in the left ventricle wall and septum, query hypertrophic cardiomyopathy. Left ventricular free wall thickness is 1.7 cm. There are patchy 3-vessel coronary calcifications. Tortuosity in the great vessels and aorta with moderate aortic atherosclerosis without aneurysm or dissection. There is a patulous upper thoracic esophagus above the carina level with fluid filling. Consider aspiration precautions unless already being done. . No intrathoracic adenopathy is seen. LUNGS AND PLEURA: The trachea and central airways are small in caliber which could be due to active respiration or bronchospasm. There is diffuse bronchial thickening right middle and both lower lobes. Symmetric small layering bilateral pleural effusions with adjacent consolidation or atelectasis in the lower lobes. Mild bilateral apical scarring change noted. The lungs are otherwise clear with evidence of mild volume loss of the right hemithorax and multiple chronic healed right rib cage fracture deformities deforming the right chest wall. There is no overt pulmonary edema. No pneumothorax. SOFT TISSUES AND BONES: There are bilateral breast implants with thick capsular calcifications. Axillary spaces are clear. There is mild to moderate thoracic kyphodextroscoliosis with osteopenia and degenerative changes of the spine. There are additional healed fractures of the left anterolateral ribs. Multiple chronic healed right rib cage fracture deformities deforming the right chest wall. ABDOMEN AND PELVIS: LIVER: The liver is unremarkable. GALLBLADDER AND BILE DUCTS: Gallbladder is unremarkable. No biliary ductal dilatation. SPLEEN:  Spleen demonstrates no acute abnormality. PANCREAS: Pancreas demonstrates no  acute abnormality. ADRENAL GLANDS: Adrenal glands demonstrate no acute abnormality. There is no adrenal mass. KIDNEYS, URETERS AND BLADDER: No renal mass. Scattered left renal scarring. 4 mm calyceal stone mid pole left kidney noted. No other nephrolithiasis. No obstructing stones. Bilateral generalized renal cortical thinning is seen. No hydronephrosis. No perinephric or periureteral stranding. The bladder demonstrates asymmetric thickening and mucosal enhancement to the right warranting urologic referral and consideration of cystoscopy. Cystitis or neoplasm could have this appearance. GI AND BOWEL: The stomach and small bowel are unremarkable. An appendix is not seen. There is a mobile cecum protruding into a new midline infraumbilical wide mouth hernia, but no evidence of hernia strangulation or bowel obstruction. There is advanced diverticulosis in the distal descending and sigmoid colon. There is faint stranding adjacent to the distal descending sigmoid concerning for mild acute diverticulitis and not seen previously. There is moderate retained stool in the ascending colon. No abnormal bowel wall thickening or distension. REPRODUCTIVE: The uterus and right ovary are unremarkable. There is a stable 2.2 cm simple appearing left ovarian cyst, hounsfield density is 11.3. No follow-up imaging is recommended. There is spray artifact from a vaginal pessary. PERITONEUM AND RETROPERITONEUM: No  free air, free hemorrhage, or abscess. There is minimal perihepatic ascites. LYMPH NODES: No lymphadenopathy. BONES AND SOFT TISSUES: There is artifact from a left hip arthroplasty, which was seen previously. There is osteopenia, moderate to severe broad-based lumbar levoscoliosis, and degenerative change of the lumbar spine. No focal soft tissue abnormality. IMPRESSION: 1. No evidence of pulmonary embolism to the segmental level, acknowledging motion-limited evaluation. 2. Mild acute diverticulitis in the distal descending  colon. No abscess or free air. 3. Asymmetric right-sided bladder wall thickening with mucosal enhancement, recommend urologic referral and consideration of cystoscopy given differential of cystitis versus neoplasm. 4. 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 5. Thyroid  nodule in the inferior left lobe measuring 1.6 cm, recommend nonemergent thyroid  ultrasound if clinically warranted given advanced age. 6. Small pleural effusions with adjacent consolidation or atelectasis in the lower lobes . 7. Small caliber central airways consistent with bronchospasm or active respiration. 8. Increasing thickening in the left ventricular wall and cardiac septum. Query hypertrophic cardiomyopathy. 9. Aortic and coronary artery atherosclerosis. 10. S-shaped thoracolumbar scoliosis, osteopenia, and degenerative changes . SABRA Electronically signed by: Francis Quam MD 05/07/2024 10:10 PM EST RP Workstation: [id number]   CT ABDOMEN PELVIS W CONTRAST Result Date: 05/07/2024 EXAM: CTA CHEST PE WITH CONTRAST CT ABDOMEN AND PELVIS WITH CONTRAST 05/07/2024 08:44:00 PM TECHNIQUE: CTA of the chest was performed after the administration of 75 mL of iohexol  (OMNIPAQUE ) 350 MG/ML intravenous contrast. Multiplanar reformatted images are provided for review. MIP images are provided for review. CT of the abdomen and pelvis was performed with the administration of intravenous contrast. Automated exposure control, iterative reconstruction, and/or weight based adjustment of the mA/kV was utilized to reduce the radiation dose to as low as reasonably achievable. COMPARISON: AP portable pelvis 12/04/2023, chest AP portable 12/04/2023, chest, abdomen, and pelvis CT with IV contrast 01/18/2020. CLINICAL HISTORY: Workup for PE. SOB. New O2 requirement. History of prior Crohn disease and history of ileitis, drop in hemoglobin, assess for abdominal hemorrhage or  inflammation. FINDINGS: CHEST: PULMONARY ARTERIES: Pulmonary arteries are upper normal for caliber. The subsegmental arterial bed is largely obscured due to respiratory motion, but no arterial embolus is seen at least to the  segmental level. There is no substantial pericardial effusion. The pulmonary veins are nondilated. MEDIASTINUM: There is a heterogeneous nodule of the inferior pole of the left lobe of the thyroid  gland measuring 1.6 cm, previously 1 cm. Nonemergent follow-up thyroid  ultrasound is recommended if clinically warranted taking into account advanced age and life expectancy. There is mild cardiomegaly, interval moderate increased thickening in the left ventricle wall and septum, query hypertrophic cardiomyopathy. Left ventricular free wall thickness is 1.7 cm. There are patchy 3-vessel coronary calcifications. Tortuosity in the great vessels and aorta with moderate aortic atherosclerosis without aneurysm or dissection. There is a patulous upper thoracic esophagus above the carina level with fluid filling. Consider aspiration precautions unless already being done. . No intrathoracic adenopathy is seen. LUNGS AND PLEURA: The trachea and central airways are small in caliber which could be due to active respiration or bronchospasm. There is diffuse bronchial thickening right middle and both lower lobes. Symmetric small layering bilateral pleural effusions with adjacent consolidation or atelectasis in the lower lobes. Mild bilateral apical scarring change noted. The lungs are otherwise clear with evidence of mild volume loss of the right hemithorax and multiple chronic healed right rib cage fracture deformities deforming the right chest wall. There is no overt pulmonary edema. No pneumothorax. SOFT TISSUES AND BONES: There are bilateral breast implants with thick capsular calcifications. Axillary spaces are clear. There is mild to moderate thoracic kyphodextroscoliosis with osteopenia and degenerative  changes of the spine. There are additional healed fractures of the left anterolateral ribs. Multiple chronic healed right rib cage fracture deformities deforming the right chest wall. ABDOMEN AND PELVIS: LIVER: The liver is unremarkable. GALLBLADDER AND BILE DUCTS: Gallbladder is unremarkable. No biliary ductal dilatation. SPLEEN: Spleen demonstrates no acute abnormality. PANCREAS: Pancreas demonstrates no acute abnormality. ADRENAL GLANDS: Adrenal glands demonstrate no acute abnormality. There is no adrenal mass. KIDNEYS, URETERS AND BLADDER: No renal mass. Scattered left renal scarring. 4 mm calyceal stone mid pole left kidney noted. No other nephrolithiasis. No obstructing stones. Bilateral generalized renal cortical thinning is seen. No hydronephrosis. No perinephric or periureteral stranding. The bladder demonstrates asymmetric thickening and mucosal enhancement to the right warranting urologic referral and consideration of cystoscopy. Cystitis or neoplasm could have this appearance. GI AND BOWEL: The stomach and small bowel are unremarkable. An appendix is not seen. There is a mobile cecum protruding into a new midline infraumbilical wide mouth hernia, but no evidence of hernia strangulation or bowel obstruction. There is advanced diverticulosis in the distal descending and sigmoid colon. There is faint stranding adjacent to the distal descending sigmoid concerning for mild acute diverticulitis and not seen previously. There is moderate retained stool in the ascending colon. No abnormal bowel wall thickening or distension. REPRODUCTIVE: The uterus and right ovary are unremarkable. There is a stable 2.2 cm simple appearing left ovarian cyst, hounsfield density is 11.3. No follow-up imaging is recommended. There is spray artifact from a vaginal pessary. PERITONEUM AND RETROPERITONEUM: No  free air, free hemorrhage, or abscess. There is minimal perihepatic ascites. LYMPH NODES: No lymphadenopathy. BONES AND SOFT  TISSUES: There is artifact from a left hip arthroplasty, which was seen previously. There is osteopenia, moderate to severe broad-based lumbar levoscoliosis, and degenerative change of the lumbar spine. No focal soft tissue abnormality. IMPRESSION: 1. No evidence of pulmonary embolism to the segmental level, acknowledging motion-limited evaluation. 2. Mild acute diverticulitis in the distal descending colon. No abscess or free air. 3. Asymmetric right-sided bladder wall thickening with mucosal enhancement, recommend urologic  referral and consideration of cystoscopy given differential of cystitis versus neoplasm. 4. 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 5. Thyroid  nodule in the inferior left lobe measuring 1.6 cm, recommend nonemergent thyroid  ultrasound if clinically warranted given advanced age. 6. Small pleural effusions with adjacent consolidation or atelectasis in the lower lobes . 7. Small caliber central airways consistent with bronchospasm or active respiration. 8. Increasing thickening in the left ventricular wall and cardiac septum. Query hypertrophic cardiomyopathy. 9. Aortic and coronary artery atherosclerosis. 10. S-shaped thoracolumbar scoliosis, osteopenia, and degenerative changes . SABRA Electronically signed by: Francis Quam MD 05/07/2024 10:10 PM EST RP Workstation: [id number]   DG Chest Portable 1 View Result Date: 05/07/2024 CLINICAL DATA:  Sepsis workup. EXAM: PORTABLE CHEST 1 VIEW COMPARISON:  None Available. FINDINGS: The cardiac silhouette is mildly enlarged and unchanged in size. Multiple chronic right-sided rib fractures are seen with chronic right-sided volume loss also noted. There is marked severity calcification of the aortic arch. There is moderate severity left basilar scarring and/or atelectasis. Small bilateral pleural effusions are noted. No pneumothorax is identified. Multilevel  degenerative changes are present throughout the thoracic spine. IMPRESSION: 1. Multiple chronic right-sided rib fractures with chronic right-sided volume loss. 2. Moderate severity left basilar scarring and/or atelectasis. 3. Small bilateral pleural effusions. Electronically Signed   By: Suzen Dials M.D.   On: 05/07/2024 20:33     TODAY-DAY OF DISCHARGE:  Subjective:   Tilton Kerns remains pleasantly confused-no major issues overnight.  Objective:   Blood pressure (!) 112/53, pulse 66, temperature 98.8 F (37.1 C), temperature source Axillary, resp. rate 18, height 5' 5 (1.651 m), weight 49 kg, SpO2 93%.  Intake/Output Summary (Last 24 hours) at 05/17/2024 0816 Last data filed at 05/17/2024 0600 Gross per 24 hour  Intake 380 ml  Output 2850 ml  Net -2470 ml   Filed Weights   05/08/24 0019  Weight: 49 kg    Exam: Pleasantly confused, No new F.N deficits, Normal affect [place].AT,PERRAL Supple Neck,No JVD, No cervical lymphadenopathy appriciated.  Symmetrical Chest wall movement, Good air movement bilaterally, CTAB RRR,No Gallops,Rubs or new Murmurs, No Parasternal Heave +ve B.Sounds, Abd Soft, Non tender, No organomegaly appriciated, No rebound -guarding or rigidity. No Cyanosis, Clubbing or edema, No new Rash or bruise   PERTINENT RADIOLOGIC STUDIES: ECHOCARDIOGRAM LIMITED Result Date: 05/16/2024    ECHOCARDIOGRAM LIMITED REPORT   Patient Name:   TREENA COSMAN Date of Exam: 05/16/2024 Medical Rec #:  [id number]       Height:       65.0 in Accession #:    [id number]      Weight:       108.0 lb Date of Birth:  1932/12/11      BSA:          1.523 m Patient Age:    89 years        BP:           156/76 mmHg Patient Gender: F               HR:           78 bpm. Exam Location:  Inpatient Procedure: Limited Echo Indications:    CHF-Acute Diastolic I50.31  History:        Patient has prior history of Echocardiogram examinations, most                 recent 08/24/2018. CHF; Risk  Factors:Hypertension.  Sonographer:    Jayson Gaskins Referring Phys: [phone] [facility]  Sonographer Comments: Image acquisition challenging due to breast implants and patient rolled on right side. No Definity used due to no IV access. IMPRESSIONS  1. Limited echo no color flow or doppler May have some degree of AS although not likely severe.  2. Left ventricular ejection fraction, by estimation, is 60 to 65%. The left ventricle has normal function. There is mild left ventricular hypertrophy.  3. Right ventricular systolic function is normal. The right ventricular size is normal.  4. The aortic valve was not well visualized. There is moderate calcification of the aortic valve. There is moderate thickening of the aortic valve. FINDINGS  Left Ventricle: Left ventricular ejection fraction, by estimation, is 60 to 65%. The left ventricle has normal function. The left ventricular internal cavity size was small. There is mild left ventricular hypertrophy. Right Ventricle: The right ventricular size is normal. Right vetricular wall thickness was not assessed. Right ventricular systolic function is normal. Pericardium: There is no evidence of pericardial effusion. Aortic Valve: The aortic valve was not well visualized. There is moderate calcification of the aortic valve. There is moderate thickening of the aortic valve. Additional Comments: Limited echo no color flow or doppler May have some degree of AS although not likely severe.  LEFT VENTRICLE PLAX 2D LVIDd:         4.25 cm LVIDs:         3.60 cm LV PW:         1.00 cm LV IVS:        1.10 cm LVOT diam:     2.00 cm LVOT Area:     3.14 cm   SHUNTS Systemic Diam: 2.00 cm Maude Emmer MD Electronically signed by Maude Emmer MD Signature Date/Time: 05/16/2024/3:07:44 PM    Final    DG Chest Port 1V same Day Result Date: 05/15/2024 EXAM: 1 VIEW(S) XRAY OF THE CHEST 05/15/2024 08:39:00 AM COMPARISON: 05/14/2024 CLINICAL HISTORY: SOB (shortness of breath) FINDINGS: LUNGS  AND PLEURA: Bilateral pleural effusions and mild pulmonary edema, unchanged. Superimposed bibasilar atelectasis and/or airspace opacities. No pneumothorax. HEART AND MEDIASTINUM: Stable cardiomegaly. Aortic atherosclerosis. BONES AND SOFT TISSUES: Chronic right rib deformities. Thoracolumbar scoliosis. IMPRESSION: 1. Bilateral pleural effusions and mild pulmonary edema, unchanged. 2. Superimposed bibasilar atelectasis and/or airspace opacities. 3. Stable cardiomegaly. Electronically signed by: Waddell Calk MD 05/15/2024 09:08 AM EST RP Workstation: [id number]     PERTINENT LAB RESULTS: CBC: Recent Labs    05/15/24 0819  WBC 5.6  HGB 7.9*  HCT 27.2*  PLT 294   CMET CMP     Component Value Date/Time   NA 142 05/16/2024 0741   K 3.7 05/16/2024 0741   CL 110 05/16/2024 0741   CO2 24 05/16/2024 0741   GLUCOSE 92 05/16/2024 0741   BUN 11 05/16/2024 0741   CREATININE 0.50 05/16/2024 0741   CREATININE 0.74 12/01/2012 1235   CALCIUM  8.5 (L) 05/16/2024 0741   CALCIUM  11.4 (H) 10/02/2009 2302   PROT 5.0 (L) 05/08/2024 0611   ALBUMIN 2.2 (L) 05/08/2024 0611   AST 16 05/08/2024 0611   ALT 13 05/08/2024 0611   ALKPHOS 53 05/08/2024 0611   BILITOT 0.6 05/08/2024 0611   GFR 78.32 05/03/2024 1142   GFRNONAA >60 05/16/2024 0741    GFR Estimated Creatinine Clearance: 37.6 mL/min (by C-G formula based on SCr of 0.5 mg/dL). No results for input(s): LIPASE, AMYLASE in the last 72 hours. No results for input(s):  CKTOTAL, CKMB, CKMBINDEX, TROPONINI in the last 72 hours. Invalid input(s): POCBNP No results for input(s): DDIMER in the last 72 hours. No results for input(s): HGBA1C in the last 72 hours. No results for input(s): CHOL, HDL, LDLCALC, TRIG, CHOLHDL, LDLDIRECT in the last 72 hours. No results for input(s): TSH, T4TOTAL, T3FREE, THYROIDAB in the last 72 hours.  Invalid input(s): FREET3 No results for input(s): VITAMINB12, FOLATE,  FERRITIN, TIBC, IRON, RETICCTPCT in the last 72 hours. Coags: No results for input(s): INR in the last 72 hours.  Invalid input(s): PT Microbiology: Recent Results (from the past 240 hours)  Resp panel by RT-PCR (RSV, Flu A&B, Covid) Anterior Nasal Swab     Status: None   Collection Time: 05/07/24  7:06 PM   Specimen: Anterior Nasal Swab  Result Value Ref Range Status   SARS Coronavirus 2 by RT PCR NEGATIVE NEGATIVE Final   Influenza A by PCR NEGATIVE NEGATIVE Final   Influenza B by PCR NEGATIVE NEGATIVE Final    Comment: (NOTE) The Xpert Xpress SARS-CoV-2/FLU/RSV plus assay is intended as an aid in the diagnosis of influenza from Nasopharyngeal swab specimens and should not be used as a sole basis for treatment. Nasal washings and aspirates are unacceptable for Xpert Xpress SARS-CoV-2/FLU/RSV testing.  Fact Sheet for Patients: bloggercourse.com  Fact Sheet for Healthcare Providers: seriousbroker.it  This test is not yet approved or cleared by the [place]  FDA and has been authorized for detection and/or diagnosis of SARS-CoV-2 by FDA under an Emergency Use Authorization (EUA). This EUA will remain in effect (meaning this test can be used) for the duration of the COVID-19 declaration under Section 564(b)(1) of the Act, 21 U.S.C. section 360bbb-3(b)(1), unless the authorization is terminated or revoked.     Resp Syncytial Virus by PCR NEGATIVE NEGATIVE Final    Comment: (NOTE) Fact Sheet for Patients: bloggercourse.com  Fact Sheet for Healthcare Providers: seriousbroker.it  This test is not yet approved or cleared by the [place]  FDA and has been authorized for detection and/or diagnosis of SARS-CoV-2 by FDA under an Emergency Use Authorization (EUA). This EUA will remain in effect (meaning this test can be used) for the duration of the COVID-19  declaration under Section 564(b)(1) of the Act, 21 U.S.C. section 360bbb-3(b)(1), unless the authorization is terminated or revoked.  Performed at [facility], [address], [place], [place] [address]   Blood culture (routine x 2)     Status: None   Collection Time: 05/07/24  7:20 PM   Specimen: BLOOD LEFT FOREARM  Result Value Ref Range Status   Specimen Description BLOOD LEFT FOREARM  Final   Special Requests   Final    BOTTLES DRAWN AEROBIC AND ANAEROBIC Blood Culture adequate volume   Culture   Final    NO GROWTH 5 DAYS Performed at [facility], [address], [place], [place] [address]    Report Status 05/12/2024 FINAL  Final  Blood culture (routine x 2)     Status: None   Collection Time: 05/08/24  6:11 AM   Specimen: BLOOD RIGHT ARM  Result Value Ref Range Status   Specimen Description BLOOD RIGHT ARM  Final   Special Requests   Final    BOTTLES DRAWN AEROBIC AND ANAEROBIC Blood Culture adequate volume   Culture   Final    NO GROWTH 5 DAYS Performed at [facility], [address], [place], [place] [address]    Report Status 05/13/2024 FINAL  Final  Urine Culture (for pregnant, neutropenic or urologic patients or patients with an indwelling urinary catheter)     Status: None   Collection Time: 05/08/24  1:23 PM   Specimen: Urine, Clean Catch  Result Value Ref Range Status   Specimen Description URINE, CLEAN CATCH  Final   Special Requests NONE  Final   Culture   Final    NO GROWTH Performed at [facility], [address] [place], [place] [address]    Report Status 05/09/2024 FINAL  Final    FURTHER DISCHARGE INSTRUCTIONS:  Get Medicines reviewed and adjusted: Please take all your medications with you for your next visit with your Primary MD  Laboratory/radiological data: Please request your Primary MD to go over all hospital tests and procedure/radiological results at the follow up, please ask your Primary MD to get all Hospital  records sent to his/her office.  In some cases, they will be blood work, cultures and biopsy results pending at the time of your discharge. Please request that your primary care M.D. goes through all the records of your hospital data and follows up on these results.  Also Note the following: If you experience worsening of your admission symptoms, develop shortness of breath, life threatening emergency, suicidal or homicidal thoughts you must seek medical attention immediately by calling 911 or calling your MD immediately  if symptoms less severe.  You must read complete instructions/literature along with all the possible adverse reactions/side effects for all the Medicines you take and that have been prescribed to you. Take any new Medicines after you have completely understood and accpet all the possible adverse reactions/side effects.   Do not drive when taking Pain medications or sleeping medications (Benzodaizepines)  Do not take more than prescribed Pain, Sleep and Anxiety Medications. It is not advisable to combine anxiety,sleep and pain medications without talking with your primary care practitioner  Special Instructions: If you have smoked or chewed Tobacco  in the last 2 yrs please stop smoking, stop any regular Alcohol  and or any Recreational drug use.  Wear Seat belts while driving.  Please note: You were cared for by a hospitalist during your hospital stay. Once you are discharged, your primary care physician will handle any further medical issues. Please note that NO REFILLS for any discharge medications will be authorized once you are discharged, as it is imperative that you return to your primary care physician (or establish a relationship with a primary care physician if you do not have one) for your post hospital discharge needs so that they can reassess your need for medications and monitor your lab values.  Total Time spent coordinating discharge including counseling,  education and face to face time equals greater than 30 minutes.  Signed: Devlyn Retter 05/17/2024 8:16 AM

## 2024-06-15 ENCOUNTER — Encounter: Payer: Self-pay | Admitting: Internal Medicine

## 2024-06-15 ENCOUNTER — Ambulatory Visit (INDEPENDENT_AMBULATORY_CARE_PROVIDER_SITE_OTHER): Payer: Medicare (Managed Care) | Admitting: Internal Medicine

## 2024-06-15 VITALS — BP 102/60 | HR 79 | Wt 106.3 lb

## 2024-06-15 DIAGNOSIS — K5732 Diverticulitis of large intestine without perforation or abscess without bleeding: Secondary | ICD-10-CM | POA: Diagnosis not present

## 2024-06-15 DIAGNOSIS — D638 Anemia in other chronic diseases classified elsewhere: Secondary | ICD-10-CM

## 2024-06-15 DIAGNOSIS — K50018 Crohn's disease of small intestine with other complication: Secondary | ICD-10-CM | POA: Diagnosis not present

## 2024-06-15 DIAGNOSIS — D500 Iron deficiency anemia secondary to blood loss (chronic): Secondary | ICD-10-CM | POA: Diagnosis not present

## 2024-06-15 NOTE — Progress Notes (Signed)
 "       [name] 89 y.o. 01-14-1933 [id number]  Assessment & Plan:   Encounter Diagnoses  Name Primary?   Crohn's disease of small intestine with other complication (HCC) Yes   Iron deficiency anemia due to chronic blood loss    Anemia of chronic disease    Sigmoid diverticulitis    Continue budesonide  for Crohn's disease.  Rectal bleeding has stopped.  Continue iron supplementation though I think she has a mixed picture of iron deficiency and anemia of chronic disease.  The patient is quite frail overall and given the stability of her hemoglobin over time I elected not to repeat that today.  I do not think it would change management.  There is no clinical evidence for persistent sigmoid diverticulitis.   Return here as needed.    Subjective:   Chief Complaint: Follow-up of Crohn's ileitis and anemia  HPI 89 year old woman with dementia, long history of Crohn's disease of the small bowel, recent findings of iron deficiency anemia and low iron saturation with low TIBC suspicious for chronic disease anemia who was hospitalized in late November and early December with diverticulitis and pneumonia and rectal bleeding.  She is here with a caregiver who provides the history due to the patient's dementia.  There has been no further rectal bleeding.  She denies abdominal pain and there are no complaints of that.  She is up in a chair but unable to ambulate.  She does not recognize me though we know each other through many years of care though long absence (2019).  She was last seen here in November 2025.    She was seen in consultation during that hospitalization by my partners.  I have reviewed the discharge summary and the consultation and progress notes from GI.  It was thought she had sepsis secondary to aspiration pneumonia and diverticulitis and some bleeding.  She had rectal bleeding on admission and had been having that when she was seen in the office prior to admission.  She  received 1 unit packed red cells.  I had previously spoken to her son prior to admission and while she was hospitalized palliative care saw her and aggressive evaluation of her problems was appropriately declined.  It was thought that the acute diverticulitis was probably an incidental finding as exam of the abdomen was benign.  She was treated with Zosyn  for the pneumonia which would have cover diverticulitis.   Lab Results  Component Value Date   IRON 10 (L) 05/03/2024   TIBC 240.8 (L) 05/03/2024   FERRITIN 11.5 05/03/2024      Latest Ref Rng & Units 05/15/2024    8:19 AM 05/13/2024    4:09 AM 05/10/2024    3:20 AM  CBC  WBC 4.0 - 10.5 K/uL 5.6  8.9  8.3   Hemoglobin 12.0 - 15.0 g/dL 7.9  8.2  8.9   Hematocrit 36.0 - 46.0 % 27.2  27.7  30.1   Platelets 150 - 400 K/uL 294  343  339     We spoke to the nursing home where she lives and she had a hemoglobin of 7.9 on December 18, and she had albumin of 2.5.  Calcium  was appropriately low with that but corrects.  No other significant derangements on his CMP or CBC. Allergies[1] Active Medications[2] Past Medical History:  Diagnosis Date   Acute diastolic (congestive) heart failure (HCC)    Bladder prolapse, female, acquired    pessary in   Chronic  diastolic (congestive) heart failure (HCC)    Chronic pain syndrome    Complete uterine prolapse    Crohn's disease (HCC) 2010   Diverticulitis    Diverticulosis    E. coli UTI 06/09/2013   Fall 05/2013   Hemorrhoids    History of hypercalcemia    History of hyperglycemia    Hydronephrosis    Hyperparathyroidism    Hypertension    Multiple pelvic fractures (HCC) 07/02/2013   Osteoporosis    Pubic ramus fracture (HCC)    Urinary incontinence 09/09/2011   Vitamin D  deficiency    Past Surgical History:  Procedure Laterality Date   COLONOSCOPY  12/23/2008   crohn's, diverticulosis, internal hemorrhoids   HIP FRACTURE SURGERY Left    left, prosthesis   LAPAROSCOPY  10/2009    PARATHYROIDECTOMY Left 08/30/2013   Procedure: LEFT INFERIOR PARATHYROIDECTOMY;  Surgeon: Krystal CHRISTELLA Spinner, MD;  Location: [facility];  Service: General;  Laterality: Left;   RHINOPLASTY     TONSILLECTOMY     Social History   Social History Narrative   Widowed.  Lives with a caregiver   3 kids.     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   Right handed    family history includes Breast cancer in her mother; Colon cancer in her father; Heart disease in her father; Osteoporosis in her mother; Prostate cancer in her brother.   Review of Systems As per HPI  Objective:   Physical Exam @BP  102/60   Pulse 79   Wt 106 lb 5 oz (48.2 kg)   SpO2 95%   BMI 17.69 kg/m @  General:  NAD but chronically ill in wheelchair, pale Eyes:   anicteric Lungs:  clear Heart::  S1S2 3/6 SEM Abdomen:  soft and nontender, Ext:   Protective boots on    Data Reviewed:   See HPI    [1] No Active Allergies [2]  No outpatient medications have been marked as taking for the 06/15/24 encounter (Office Visit) with Avram Lupita BRAVO, MD.   "

## 2024-06-15 NOTE — Patient Instructions (Addendum)
 Continue your current medicines and follow up with us  as needed.  If your blood pressure at your visit was 140/90 or greater, please contact your primary care physician to follow up on this.  _______________________________________________________  If you are age 89 or older, your body mass index should be between 23-30. Your Body mass index is 17.69 kg/m. If this is out of the aforementioned range listed, please consider follow up with your Primary Care Provider.  If you are age 52 or younger, your body mass index should be between 19-25. Your Body mass index is 17.69 kg/m. If this is out of the aformentioned range listed, please consider follow up with your Primary Care Provider.   ________________________________________________________  The [facility] providers would like to encourage you to use MYCHART to communicate with providers for non-urgent requests or questions.  Due to long hold times on the telephone, sending your provider a message by [facility] may be a faster and more efficient way to get a response.  Please allow 48 business hours for a response.  Please remember that this is for non-urgent requests.  _______________________________________________________  [facility] is using a team-based approach to care.  Your team is made up of your doctor and two to three APPS. Our APPS (Nurse Practitioners and Physician Assistants) work with your physician to ensure care continuity for you. They are fully qualified to address your health concerns and develop a treatment plan. They communicate directly with your gastroenterologist to care for you. Seeing the Advanced Practice Practitioners on your physician's team can help you by facilitating care more promptly, often allowing for earlier appointments, access to diagnostic testing, procedures, and other specialty referrals.    I appreciate the opportunity to care for you. Lupita Commander, MD, [facility]

## 2024-06-15 NOTE — Progress Notes (Deleted)
 "       [name] 89 y.o. 1932/11/01 [id number]  Assessment & Plan:      Subjective:   Chief Complaint:  HPI  Allergies[1] Active Medications[2] Past Medical History:  Diagnosis Date   Acute diastolic (congestive) heart failure (HCC)    Bladder prolapse, female, acquired    pessary in   Chronic diastolic (congestive) heart failure (HCC)    Chronic pain syndrome    Complete uterine prolapse    Crohn's disease (HCC) 2010   Diverticulitis    Diverticulosis    E. coli UTI 06/09/2013   Fall 05/2013   Hemorrhoids    History of hypercalcemia    History of hyperglycemia    Hydronephrosis    Hyperparathyroidism    Hypertension    Multiple pelvic fractures (HCC) 07/02/2013   Osteoporosis    Pubic ramus fracture (HCC)    Urinary incontinence 09/09/2011   Vitamin D  deficiency    Past Surgical History:  Procedure Laterality Date   COLONOSCOPY  12/23/2008   crohn's, diverticulosis, internal hemorrhoids   HIP FRACTURE SURGERY Left    left, prosthesis   LAPAROSCOPY  10/2009   PARATHYROIDECTOMY Left 08/30/2013   Procedure: LEFT INFERIOR PARATHYROIDECTOMY;  Surgeon: Krystal CHRISTELLA Spinner, MD;  Location: [facility];  Service: General;  Laterality: Left;   RHINOPLASTY     TONSILLECTOMY     Social History   Social History Narrative   Widowed.  Lives with a caregiver   3 kids.     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   Right handed    family history includes Breast cancer in her mother; Colon cancer in her father; Heart disease in her father; Osteoporosis in her mother; Prostate cancer in her brother.   Review of Systems   Objective:   Physical Exam        [name] 89 y.o. [birth year] [id number]  Assessment & Plan:      Subjective:   Chief Complaint:  HPI  Allergies[1] Active Medications[2] Past Medical History:  Diagnosis Date   Acute diastolic (congestive) heart failure (HCC)    Bladder  prolapse, female, acquired    pessary in   Chronic diastolic (congestive) heart failure (HCC)    Chronic pain syndrome    Complete uterine prolapse    Crohn's disease (HCC) 2010   Diverticulitis    Diverticulosis    E. coli UTI 06/09/2013   Fall 05/2013   Hemorrhoids    History of hypercalcemia    History of hyperglycemia    Hydronephrosis    Hyperparathyroidism    Hypertension    Multiple pelvic fractures (HCC) 07/02/2013   Osteoporosis    Pubic ramus fracture (HCC)    Urinary incontinence 09/09/2011   Vitamin D  deficiency    Past Surgical History:  Procedure Laterality Date   COLONOSCOPY  12/23/2008   crohn's, diverticulosis, internal hemorrhoids   HIP FRACTURE SURGERY Left    left, prosthesis   LAPAROSCOPY  10/2009   PARATHYROIDECTOMY Left 08/30/2013   Procedure: LEFT INFERIOR PARATHYROIDECTOMY;  Surgeon: Krystal CHRISTELLA Spinner, MD;  Location: [facility];  Service: General;  Laterality: Left;   RHINOPLASTY     TONSILLECTOMY     Social History   Social History Narrative   Widowed.  Lives with a caregiver   3 kids.     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   Right handed    family history includes Breast cancer in her mother; Colon cancer in her father; Heart disease in her father; Osteoporosis in her mother; Prostate cancer in her brother.   Review of Systems   Objective:   Physical Exam     [1] No Active Allergies [2]  No outpatient medications have been marked as taking for the 06/15/24 encounter (Office Visit) with Avram Lupita BRAVO, MD.  [1] No Active Allergies [2]  No outpatient medications have been marked as taking for the 06/15/24 encounter (Office Visit) with Avram Lupita BRAVO, MD.   "

## 2024-07-11 DEATH — deceased
